# Patient Record
Sex: Female | Born: 1950 | Race: White | Hispanic: No | Marital: Married | Smoking: Former smoker
Health system: Southern US, Community
[De-identification: ages and names within clinical notes are randomized; demographics above are authoritative.]

## PROBLEM LIST (undated history)

## (undated) DIAGNOSIS — H3554 Dystrophies primarily involving the retinal pigment epithelium: Secondary | ICD-10-CM

## (undated) DIAGNOSIS — G4730 Sleep apnea, unspecified: Secondary | ICD-10-CM

## (undated) DIAGNOSIS — I10 Essential (primary) hypertension: Secondary | ICD-10-CM

## (undated) DIAGNOSIS — R011 Cardiac murmur, unspecified: Secondary | ICD-10-CM

## (undated) DIAGNOSIS — F419 Anxiety disorder, unspecified: Secondary | ICD-10-CM

## (undated) DIAGNOSIS — J449 Chronic obstructive pulmonary disease, unspecified: Secondary | ICD-10-CM

## (undated) DIAGNOSIS — O00109 Unspecified tubal pregnancy without intrauterine pregnancy: Secondary | ICD-10-CM

## (undated) DIAGNOSIS — R911 Solitary pulmonary nodule: Secondary | ICD-10-CM

## (undated) DIAGNOSIS — M1990 Unspecified osteoarthritis, unspecified site: Secondary | ICD-10-CM

## (undated) DIAGNOSIS — K635 Polyp of colon: Secondary | ICD-10-CM

## (undated) DIAGNOSIS — W19XXXA Unspecified fall, initial encounter: Secondary | ICD-10-CM

## (undated) DIAGNOSIS — Z9621 Cochlear implant status: Secondary | ICD-10-CM

## (undated) DIAGNOSIS — A048 Other specified bacterial intestinal infections: Secondary | ICD-10-CM

## (undated) DIAGNOSIS — H539 Unspecified visual disturbance: Secondary | ICD-10-CM

## (undated) DIAGNOSIS — G43909 Migraine, unspecified, not intractable, without status migrainosus: Secondary | ICD-10-CM

## (undated) DIAGNOSIS — Z79890 Hormone replacement therapy (postmenopausal): Secondary | ICD-10-CM

## (undated) DIAGNOSIS — Z86018 Personal history of other benign neoplasm: Secondary | ICD-10-CM

## (undated) DIAGNOSIS — E785 Hyperlipidemia, unspecified: Secondary | ICD-10-CM

## (undated) DIAGNOSIS — R159 Full incontinence of feces: Secondary | ICD-10-CM

## (undated) DIAGNOSIS — K219 Gastro-esophageal reflux disease without esophagitis: Secondary | ICD-10-CM

## (undated) DIAGNOSIS — R2689 Other abnormalities of gait and mobility: Secondary | ICD-10-CM

## (undated) DIAGNOSIS — R42 Dizziness and giddiness: Secondary | ICD-10-CM

## (undated) DIAGNOSIS — R413 Other amnesia: Secondary | ICD-10-CM

## (undated) HISTORY — DX: Dizziness and giddiness: R42

## (undated) HISTORY — DX: Polyp of colon: K63.5

## (undated) HISTORY — DX: Unspecified visual disturbance: H53.9

## (undated) HISTORY — DX: Other specified bacterial intestinal infections: A04.8

## (undated) HISTORY — PX: KNEE SURGERY: SHX244

## (undated) HISTORY — DX: Unspecified fall, initial encounter: W19.XXXA

## (undated) HISTORY — DX: Migraine, unspecified, not intractable, without status migrainosus: G43.909

## (undated) HISTORY — PX: OOPHORECTOMY: SHX86

## (undated) HISTORY — DX: Hormone replacement therapy: Z79.890

## (undated) HISTORY — DX: Other abnormalities of gait and mobility: R26.89

## (undated) HISTORY — DX: Other amnesia: R41.3

## (undated) HISTORY — PX: OTHER SURGICAL HISTORY: SHX169

## (undated) HISTORY — DX: Essential (primary) hypertension: I10

## (undated) HISTORY — DX: Anxiety disorder, unspecified: F41.9

## (undated) HISTORY — DX: Chronic obstructive pulmonary disease, unspecified: J44.9

## (undated) HISTORY — DX: Unspecified tubal pregnancy without intrauterine pregnancy: O00.109

## (undated) HISTORY — DX: Unspecified osteoarthritis, unspecified site: M19.90

## (undated) HISTORY — DX: Solitary pulmonary nodule: R91.1

---
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
## 1976-03-25 HISTORY — PX: ECTOPIC PREGNANCY SURGERY: SHX613

## 1978-03-25 DIAGNOSIS — Z9889 Other specified postprocedural states: Secondary | ICD-10-CM

## 1978-03-25 DIAGNOSIS — R112 Nausea with vomiting, unspecified: Secondary | ICD-10-CM

## 1978-03-25 HISTORY — DX: Other specified postprocedural states: Z98.890

## 1978-03-25 HISTORY — DX: Nausea with vomiting, unspecified: R11.2

## 1988-03-25 DIAGNOSIS — F32A Depression, unspecified: Secondary | ICD-10-CM

## 1988-03-25 DIAGNOSIS — R519 Headache, unspecified: Secondary | ICD-10-CM

## 1988-03-25 HISTORY — DX: Headache, unspecified: R51.9

## 1988-03-25 HISTORY — DX: Depression, unspecified: F32.A

## 1993-03-25 HISTORY — PX: HYSTERECTOMY: SHX81

## 1997-03-25 HISTORY — PX: KNEE ARTHROSCOPY W/ MENISCAL REPAIR: SHX1877

## 2013-03-05 ENCOUNTER — Other Ambulatory Visit: Payer: Self-pay

## 2013-03-05 ENCOUNTER — Ambulatory Visit (INDEPENDENT_AMBULATORY_CARE_PROVIDER_SITE_OTHER): Payer: Enrolled Prime—HMO | Admitting: Neurological Surgery

## 2013-03-05 ENCOUNTER — Encounter (INDEPENDENT_AMBULATORY_CARE_PROVIDER_SITE_OTHER): Payer: Self-pay | Admitting: Neurological Surgery

## 2013-03-05 VITALS — BP 124/77 | HR 73 | Resp 18 | Wt 190.0 lb

## 2013-03-05 DIAGNOSIS — D333 Benign neoplasm of cranial nerves: Secondary | ICD-10-CM | POA: Insufficient documentation

## 2013-03-05 NOTE — Progress Notes (Signed)
Review of Systems   Constitutional: Negative for fever and appetite change.   HENT: Positive for tinnitus and trouble swallowing.    Eyes: Negative for pain.   Respiratory: Negative for chest tightness and shortness of breath.    Cardiovascular: Negative for leg swelling.   Genitourinary: Negative for dysuria and urgency.   Skin: Negative for pallor and rash.   Neurological: Positive for dizziness, light-headedness and headaches.   Hematological: Does not bruise/bleed easily.   Psychiatric/Behavioral: Positive for dysphoric mood.

## 2013-03-05 NOTE — Progress Notes (Signed)
I  had the opportunity to meet your very nice patient, Audrey Ray with  her husband in my neurosurgical clinic in consultation on December 12.  As  you are very familiar with her recent history, I will review the salient  neurosurgical details here for our records.     Audrey Ray is an otherwise very healthy-appearing 62 year old woman who  suffers from just over 1 year of progressive right-sided hearing loss,  tinnitus and loss of balance.  She states that the symptoms may have  predated this time period, but she began to notice the acceleration of her  neurological abnormalities after visiting a PCP who performed a hearing  test in January.  She has also been noticing now that in her activities as  a competitive speed walker, she has a tendency to lean to one side and does  tend to lose her balance spontaneously at times.  She has also noticed a  longstanding feeling of fullness in the right ear.  She otherwise denies  headache, nausea, or vomiting, seizure or syncopal-like episode, loss of  visual acuity or double vision,  difficulty swallowing or change in facial  movement or sensation, focal motor or sensory abnormality, or bowel or  bladder disturbance.  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     On neurological exam, Audrey Ray is awake and alert and grossly cognitively  intact.  She states that she is right-handed.  There is no evidence of  expressive or receptive aphasia.  Visual fields are full to confrontation  throughout.  There is no ophthalmoplegia or nystagmus.  Rapid saccades are   intact and she denies diplopia.  Facial sensation and movements are intact  and symmetric.  Her tongue is midline and there is no evidence of  fasciculation or atrophy.  Her palate is upgoing bilaterally.   Sternocleidomastoid and trapezius function is 5/5.  The remainder of her  motor exam  is unremarkable.  She is able to heel and toe walk without  difficulty.  She does have a fairly significant Romberg sign.  Rapid  alternating movement is intact bilaterally.     I had for review the MRIs of the brain that were performed with and without  contrast over the past several months.  As you know, this demonstrates an  approximately 7 mm in greatest dimension, homogeneously enhancing mass  involving the internal auditory canal on the right side without extension  into the intracranial space.  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     I spent 60 minutes with Audrey Ray and her husband in my history taking,  physical examination, review of the imaging data, and counseling, 40  minutes of which were spent in counseling.  Audrey Ray is obviously very well  educated about the surgical options as well as the possibilities of  radiosurgery.  We discussed the risks and benefits of the translabyrinthine  approach and she is very well aware that she is going to lose her hearing  with this approach.  We discussed the relatively low risk of CSF leak and  methods that we would potentially pursue were she to develop such leak.  We  also discussed her followup and I noted that she would likely be feeling  fairly close to her normal baseline within 1 month following surgery.     Thank you  very much for allowing me to participate in the care of your very  nice patient.  We will certainly start working on scheduling surgery for  some point in the end of January.

## 2013-03-25 DIAGNOSIS — H918X9 Other specified hearing loss, unspecified ear: Secondary | ICD-10-CM

## 2013-03-25 HISTORY — PX: BRAIN SURGERY: SHX531

## 2013-03-25 HISTORY — DX: Other specified hearing loss, unspecified ear: H91.8X9

## 2013-04-07 ENCOUNTER — Ambulatory Visit: Payer: Enrolled Prime—HMO

## 2013-04-07 NOTE — Pre-Procedure Instructions (Signed)
Pt states preop labs, ekg were done by PMD today, 04/07/13, requesting by fax to surgeon to have copy of notes and results sent to [phone].

## 2013-04-13 NOTE — Pre-Procedure Instructions (Signed)
Spoke w/Sillo at [facility] where EKG was done requesting a better copy of EKG. Per Sillo, their system is down & she will fax a better copy as soon as the system is back up.

## 2013-04-14 ENCOUNTER — Inpatient Hospital Stay: Payer: Enrolled Prime—HMO | Admitting: Registered Nurse

## 2013-04-14 ENCOUNTER — Inpatient Hospital Stay: Payer: Enrolled Prime—HMO | Admitting: Neurological Surgery

## 2013-04-14 ENCOUNTER — Encounter
Admission: RE | Disposition: A | Discharge status: Home Health | Payer: Self-pay | Source: Ambulatory Visit | Attending: Otolaryngology

## 2013-04-14 ENCOUNTER — Encounter: Payer: Self-pay | Admitting: Registered Nurse

## 2013-04-14 ENCOUNTER — Inpatient Hospital Stay
Admission: RE | Admit: 2013-04-14 | Discharge: 2013-04-21 | DRG: 026 | Disposition: A | Discharge status: Home Health | Payer: Enrolled Prime—HMO | Source: Ambulatory Visit | Attending: Otolaryngology | Admitting: Otolaryngology

## 2013-04-14 ENCOUNTER — Ambulatory Visit: Payer: Self-pay

## 2013-04-14 DIAGNOSIS — R112 Nausea with vomiting, unspecified: Secondary | ICD-10-CM | POA: Diagnosis not present

## 2013-04-14 DIAGNOSIS — H9319 Tinnitus, unspecified ear: Secondary | ICD-10-CM | POA: Diagnosis present

## 2013-04-14 DIAGNOSIS — G988 Other disorders of nervous system: Secondary | ICD-10-CM | POA: Diagnosis not present

## 2013-04-14 DIAGNOSIS — G9601 Cranial cerebrospinal fluid leak, spontaneous: Secondary | ICD-10-CM | POA: Diagnosis not present

## 2013-04-14 DIAGNOSIS — H5500 Unspecified nystagmus: Secondary | ICD-10-CM | POA: Diagnosis not present

## 2013-04-14 DIAGNOSIS — R42 Dizziness and giddiness: Secondary | ICD-10-CM | POA: Diagnosis not present

## 2013-04-14 DIAGNOSIS — D210 Benign neoplasm of connective and other soft tissue of head, face and neck: Secondary | ICD-10-CM

## 2013-04-14 DIAGNOSIS — Y838 Other surgical procedures as the cause of abnormal reaction of the patient, or of later complication, without mention of misadventure at the time of the procedure: Secondary | ICD-10-CM | POA: Diagnosis not present

## 2013-04-14 DIAGNOSIS — K219 Gastro-esophageal reflux disease without esophagitis: Secondary | ICD-10-CM | POA: Diagnosis present

## 2013-04-14 DIAGNOSIS — H9190 Unspecified hearing loss, unspecified ear: Secondary | ICD-10-CM | POA: Diagnosis present

## 2013-04-14 DIAGNOSIS — D333 Benign neoplasm of cranial nerves: Secondary | ICD-10-CM | POA: Diagnosis present

## 2013-04-14 HISTORY — PX: CRANIOTOMY, TRANS-PETROSAL APPROACH: SHX3528

## 2013-04-14 HISTORY — PX: MASTOIDECTOMY, MODIFIED: SHX4795

## 2013-04-14 LAB — BASIC METABOLIC PANEL
BUN: 12 mg/dL (ref 7.0–19.0)
CO2: 23 mEq/L (ref 22–29)
Calcium: 8.8 mg/dL (ref 8.5–10.5)
Chloride: 106 mEq/L (ref 98–107)
Creatinine: 0.8 mg/dL (ref 0.6–1.0)
Glucose: 181 mg/dL — ABNORMAL HIGH (ref 70–100)
Potassium: 4.2 mEq/L (ref 3.5–5.1)
Sodium: 138 mEq/L (ref 136–145)

## 2013-04-14 LAB — CBC
Hematocrit: 38 % (ref 37.0–47.0)
Hgb: 12.5 g/dL (ref 12.0–16.0)
MCH: 29.6 pg (ref 28.0–32.0)
MCHC: 32.9 g/dL (ref 32.0–36.0)
MCV: 90 fL (ref 80.0–100.0)
MPV: 9.7 fL (ref 9.4–12.3)
Nucleated RBC: 0 (ref 0–1)
Platelets: 307 10*3/uL (ref 140–400)
RBC: 4.22 10*6/uL (ref 4.20–5.40)
RDW: 13 % (ref 12–15)
WBC: 11.64 10*3/uL — ABNORMAL HIGH (ref 3.50–10.80)

## 2013-04-14 LAB — GFR: EGFR: >60

## 2013-04-14 LAB — TYPE AND SCREEN
AB Screen Gel: NEGATIVE
ABO Rh: A POS

## 2013-04-14 SURGERY — CRANIOTOMY, TRANS-PETROSAL APPROACH
Anesthesia: Anesthesia General | Site: Ear | Laterality: Right | Wound class: Clean

## 2013-04-14 MED ORDER — ONDANSETRON HCL 4 MG/2ML IJ SOLN
4.0000 mg | Freq: Once | INTRAMUSCULAR | Status: DC | PRN
Start: 2013-04-14 — End: 2013-04-14

## 2013-04-14 MED ORDER — SODIUM CHLORIDE 0.9 % IV MBP
1.0000 g | Freq: Three times a day (TID) | INTRAVENOUS | Status: DC
Start: 2013-04-14 — End: 2013-04-14
  Administered 2013-04-14: 1 g via INTRAVENOUS

## 2013-04-14 MED ORDER — CEFAZOLIN SODIUM 1 G IJ SOLR
INTRAMUSCULAR | Status: AC
Start: 2013-04-14 — End: 2013-04-14
  Filled 2013-04-14: qty 1000

## 2013-04-14 MED ORDER — DIAZEPAM 5 MG/ML IJ SOLN
INTRAMUSCULAR | Status: AC
Start: 2013-04-14 — End: 2013-04-14
  Administered 2013-04-14: 5 mg via INTRAVENOUS
  Filled 2013-04-14: qty 2

## 2013-04-14 MED ORDER — ROCURONIUM BROMIDE 50 MG/5ML IV SOLN
INTRAVENOUS | Status: AC
Start: 2013-04-14 — End: ?
  Filled 2013-04-14: qty 5

## 2013-04-14 MED ORDER — FAMOTIDINE 20 MG/2ML IV SOLN
INTRAVENOUS | Status: AC
Start: 2013-04-14 — End: ?
  Filled 2013-04-14: qty 2

## 2013-04-14 MED ORDER — MEPERIDINE HCL 25 MG/ML IJ SOLN
25.0000 mg | INTRAMUSCULAR | Status: DC | PRN
Start: 2013-04-14 — End: 2013-04-14

## 2013-04-14 MED ORDER — PROPOFOL INFUSION 10 MG/ML
INTRAVENOUS | Status: DC | PRN
Start: 2013-04-14 — End: 2013-04-14
  Administered 2013-04-14: 150 mg via INTRAVENOUS
  Administered 2013-04-14: 50 mg via INTRAVENOUS

## 2013-04-14 MED ORDER — THROMBIN 5000 UNITS EX SOLR
CUTANEOUS | Status: DC | PRN
Start: 2013-04-14 — End: 2013-04-14
  Administered 2013-04-14: 5000 [IU] via TOPICAL

## 2013-04-14 MED ORDER — MORPHINE SULFATE 2 MG/ML IJ/IV SOLN (WRAP)
1.0000 mg | Status: DC | PRN
Start: 2013-04-14 — End: 2013-04-21
  Administered 2013-04-16 – 2013-04-19 (×3): 1 mg via INTRAVENOUS
  Filled 2013-04-14 (×3): qty 1

## 2013-04-14 MED ORDER — CEFAZOLIN 1 GM MBP (CNR)
Status: AC
Start: 2013-04-14 — End: ?
  Filled 2013-04-14: qty 50

## 2013-04-14 MED ORDER — GLYCOPYRROLATE 0.2 MG/ML IJ SOLN
INTRAMUSCULAR | Status: AC
Start: 2013-04-14 — End: ?
  Filled 2013-04-14: qty 1

## 2013-04-14 MED ORDER — PHENYLEPHRINE 100 MCG/ML IV BOLUS (ANESTHESIA)
PREFILLED_SYRINGE | INTRAVENOUS | Status: AC
Start: 2013-04-14 — End: ?
  Filled 2013-04-14: qty 10

## 2013-04-14 MED ORDER — DOCUSATE SODIUM 100 MG PO CAPS
100.0000 mg | ORAL_CAPSULE | Freq: Two times a day (BID) | ORAL | Status: DC
Start: 2013-04-14 — End: 2013-04-21
  Administered 2013-04-17 – 2013-04-21 (×8): 100 mg via ORAL
  Filled 2013-04-14 (×8): qty 1

## 2013-04-14 MED ORDER — ONDANSETRON HCL 4 MG/2ML IJ SOLN
4.0000 mg | INTRAMUSCULAR | Status: DC | PRN
Start: 2013-04-14 — End: 2013-04-15
  Administered 2013-04-14: 4 mg via INTRAVENOUS
  Filled 2013-04-14: qty 2

## 2013-04-14 MED ORDER — LIDOCAINE HCL 2 % IJ SOLN
INTRAMUSCULAR | Status: DC | PRN
Start: 2013-04-14 — End: 2013-04-14
  Administered 2013-04-14: 60 mg

## 2013-04-14 MED ORDER — HYDROMORPHONE HCL PF 1 MG/ML IJ SOLN
0.5000 mg | INTRAMUSCULAR | Status: DC | PRN
Start: 2013-04-14 — End: 2013-04-14

## 2013-04-14 MED ORDER — PROMETHAZINE HCL 25 MG/ML IJ SOLN
6.2500 mg | Freq: Once | INTRAMUSCULAR | Status: DC | PRN
Start: 2013-04-14 — End: 2013-04-14

## 2013-04-14 MED ORDER — EPHEDRINE SULFATE 50 MG/ML IJ SOLN
INTRAMUSCULAR | Status: AC
Start: 2013-04-14 — End: ?
  Filled 2013-04-14: qty 1

## 2013-04-14 MED ORDER — PHENYLEPHRINE 100 MCG/ML IV BOLUS (ANESTHESIA)
PREFILLED_SYRINGE | INTRAVENOUS | Status: AC
Start: 2013-04-14 — End: ?
  Filled 2013-04-14: qty 5

## 2013-04-14 MED ORDER — ALUM & MAG HYDROXIDE-SIMETH 200-200-20 MG/5ML PO SUSP
30.0000 mL | ORAL | Status: DC | PRN
Start: 2013-04-14 — End: 2013-04-21

## 2013-04-14 MED ORDER — NEOSTIGMINE METHYLSULFATE 1 MG/ML IJ SOLN
INTRAMUSCULAR | Status: DC | PRN
Start: 2013-04-14 — End: 2013-04-14
  Administered 2013-04-14: 2 mg via INTRAVENOUS

## 2013-04-14 MED ORDER — ALBUMIN HUMAN 5 % IV SOLN
INTRAVENOUS | Status: DC | PRN
Start: 2013-04-14 — End: 2013-04-14

## 2013-04-14 MED ORDER — FENTANYL CITRATE 0.05 MG/ML IJ SOLN
INTRAMUSCULAR | Status: AC
Start: 2013-04-14 — End: ?
  Filled 2013-04-14: qty 4

## 2013-04-14 MED ORDER — LIDOCAINE-EPINEPHRINE 1 %-1:100000 IJ SOLN
INTRAMUSCULAR | Status: DC | PRN
Start: 2013-04-14 — End: 2013-04-14
  Administered 2013-04-14: 4 mL

## 2013-04-14 MED ORDER — GELATIN ABSORBABLE 100 EX MISC
CUTANEOUS | Status: DC | PRN
Start: 2013-04-14 — End: 2013-04-14
  Administered 2013-04-14: 1 via TOPICAL

## 2013-04-14 MED ORDER — PANTOPRAZOLE SODIUM 40 MG PO TBEC
40.0000 mg | DELAYED_RELEASE_TABLET | Freq: Every morning | ORAL | Status: DC
Start: 2013-04-15 — End: 2013-04-21
  Administered 2013-04-17 – 2013-04-21 (×4): 40 mg via ORAL
  Filled 2013-04-14 (×4): qty 1

## 2013-04-14 MED ORDER — FENTANYL CITRATE 0.05 MG/ML IJ SOLN
25.0000 ug | INTRAMUSCULAR | Status: DC | PRN
Start: 2013-04-14 — End: 2013-04-14

## 2013-04-14 MED ORDER — LIDOCAINE HCL (PF) 2 % IJ SOLN
INTRAMUSCULAR | Status: AC
Start: 2013-04-14 — End: ?
  Filled 2013-04-14: qty 5

## 2013-04-14 MED ORDER — NORTRIPTYLINE HCL 25 MG PO CAPS
75.0000 mg | ORAL_CAPSULE | Freq: Every evening | ORAL | Status: DC
Start: 2013-04-14 — End: 2013-04-21
  Administered 2013-04-16 – 2013-04-20 (×5): 75 mg via ORAL
  Filled 2013-04-14 (×8): qty 3

## 2013-04-14 MED ORDER — ONDANSETRON HCL 4 MG/2ML IJ SOLN
INTRAMUSCULAR | Status: DC | PRN
Start: 2013-04-14 — End: 2013-04-14
  Administered 2013-04-14: 4 mg via INTRAVENOUS

## 2013-04-14 MED ORDER — ROCURONIUM BROMIDE 50 MG/5ML IV SOLN
INTRAVENOUS | Status: DC | PRN
Start: 2013-04-14 — End: 2013-04-14
  Administered 2013-04-14: 40 mg via INTRAVENOUS
  Administered 2013-04-14: 10 mg via INTRAVENOUS

## 2013-04-14 MED ORDER — LACTATED RINGERS IV SOLN
INTRAVENOUS | Status: DC
Start: 2013-04-14 — End: 2013-04-14

## 2013-04-14 MED ORDER — SODIUM CHLORIDE 0.9 % IV MBP
1.0000 g | Freq: Three times a day (TID) | INTRAVENOUS | Status: AC
Start: 2013-04-14 — End: 2013-04-15
  Administered 2013-04-14 – 2013-04-15 (×4): 1 g via INTRAVENOUS
  Filled 2013-04-14: qty 1000
  Filled 2013-04-14: qty 50
  Filled 2013-04-14 (×2): qty 1000
  Filled 2013-04-14: qty 50

## 2013-04-14 MED ORDER — CALCIUM CITRATE-VITAMIN D 315-250 MG-UNIT PO TABS
1.0000 | ORAL_TABLET | Freq: Every day | ORAL | Status: DC
Start: 2013-04-14 — End: 2013-04-21
  Administered 2013-04-17 – 2013-04-21 (×4): 1 via ORAL
  Filled 2013-04-14 (×4): qty 1

## 2013-04-14 MED ORDER — FAMOTIDINE 10 MG/ML IV SOLN (WRAP)
INTRAVENOUS | Status: DC | PRN
Start: 2013-04-14 — End: 2013-04-14
  Administered 2013-04-14: 20 mg via INTRAVENOUS

## 2013-04-14 MED ORDER — PHENYLEPHRINE 100 MCG/ML IV BOLUS (ANESTHESIA)
PREFILLED_SYRINGE | INTRAVENOUS | Status: DC | PRN
Start: 2013-04-14 — End: 2013-04-14
  Administered 2013-04-14 (×15): 100 ug via INTRAVENOUS

## 2013-04-14 MED ORDER — PROPOFOL 10 MG/ML IV EMUL
INTRAVENOUS | Status: AC
Start: 2013-04-14 — End: ?
  Filled 2013-04-14: qty 20

## 2013-04-14 MED ORDER — MAGNESIUM HYDROXIDE 400 MG/5ML PO SUSP
30.0000 mL | ORAL | Status: DC | PRN
Start: 2013-04-14 — End: 2013-04-21

## 2013-04-14 MED ORDER — DEXAMETHASONE SODIUM PHOSPHATE 4 MG/ML IJ SOLN (WRAP)
INTRAMUSCULAR | Status: DC | PRN
Start: 2013-04-14 — End: 2013-04-14
  Administered 2013-04-14: 10 mg via INTRAVENOUS

## 2013-04-14 MED ORDER — LACTATED RINGERS IV SOLN
INTRAVENOUS | Status: DC
Start: 2013-04-14 — End: 2013-04-16
  Administered 2013-04-16: 100 mL/h via INTRAVENOUS

## 2013-04-14 MED ORDER — GLYCOPYRROLATE 0.2 MG/ML IJ SOLN
INTRAMUSCULAR | Status: DC | PRN
Start: 2013-04-14 — End: 2013-04-14
  Administered 2013-04-14: 0.4 mg via INTRAVENOUS

## 2013-04-14 MED ORDER — DEXAMETHASONE SODIUM PHOSPHATE 20 MG/5ML IJ SOLN
INTRAMUSCULAR | Status: AC
Start: 2013-04-14 — End: ?
  Filled 2013-04-14: qty 5

## 2013-04-14 MED ORDER — DIAZEPAM 5 MG/ML IJ SOLN
5.0000 mg | INTRAMUSCULAR | Status: DC | PRN
Start: 2013-04-14 — End: 2013-04-16
  Administered 2013-04-15 (×3): 5 mg via INTRAVENOUS
  Filled 2013-04-14 (×4): qty 2

## 2013-04-14 MED ORDER — FENTANYL CITRATE 0.05 MG/ML IJ SOLN
INTRAMUSCULAR | Status: DC | PRN
Start: 2013-04-14 — End: 2013-04-14
  Administered 2013-04-14 (×2): 50 ug via INTRAVENOUS
  Administered 2013-04-14: 75 ug via INTRAVENOUS
  Administered 2013-04-14 (×3): 50 ug via INTRAVENOUS
  Administered 2013-04-14: 75 ug via INTRAVENOUS

## 2013-04-14 MED ORDER — SODIUM CHLORIDE 0.9 % IV SOLN
INTRAVENOUS | Status: DC | PRN
Start: 2013-04-14 — End: 2013-04-14

## 2013-04-14 MED ORDER — OXYCODONE-ACETAMINOPHEN 5-325 MG PO TABS
2.0000 | ORAL_TABLET | ORAL | Status: DC | PRN
Start: 2013-04-14 — End: 2013-04-21
  Administered 2013-04-18 – 2013-04-21 (×6): 2 via ORAL
  Filled 2013-04-14 (×6): qty 2

## 2013-04-14 MED ORDER — SODIUM CHLORIDE 0.9 % IR SOLN
Status: DC | PRN
Start: 2013-04-14 — End: 2013-04-14
  Administered 2013-04-14: 1000 mL

## 2013-04-14 MED ORDER — ONDANSETRON HCL 4 MG/2ML IJ SOLN
INTRAMUSCULAR | Status: AC
Start: 2013-04-14 — End: ?
  Filled 2013-04-14: qty 2

## 2013-04-14 SURGICAL SUPPLY — 68 items
BANDAGE KERLIX MEDIUM GAUZE L3.6 YD X (Dressing) ×2 IMPLANT
BLADE S/SU RIBBACK CARB STL 15 (Blade) ×3 IMPLANT
BLADE SURGICAL DISPOSABLE (Blade) ×3 IMPLANT
BNDG KRLX GZE 3.6YDX6.4IN MED CTTN 6 PLY (Dressing) ×1
BUR M 2 (Burr) ×3 IMPLANT
CLIP EXTERNAL CARTRIDGE SERRATE EDGE JAW (Clips) ×2
CLIP EXTERNAL CARTRIDGE SERRATE EDGE JAW HEAVY CLAMP FORCE ACRACLIP (Clips) ×2 IMPLANT
CLIP XTRN ACRACLIP STRL CRTDG SRR EDG (Clips) ×1
CLOSURE STERI-STRIP 1X5IN (Dressing) ×3 IMPLANT
CONTAINER SPEC 8OZ NS SNPON LID TRNLU (Suction) ×9 IMPLANT
COTTONOID 1/2X1" 80-1402" (Dressing) IMPLANT
CTTND 1/2X1/2" (Sponge) IMPLANT
DRAPE LIECA MICROSCOPE 54X150 (Drape) ×3 IMPLANT
DRESSING FLEXZAN 4X4 (Dressing) ×3 IMPLANT
DRESSING SURG PATTY .5X3IN (Sponge) ×3 IMPLANT
FLOSEAL EHSD 5ML (Hemostat) ×3 IMPLANT
GAUZE KERLIX 4.5X4YDS (Dressing) ×3 IMPLANT
GLOVE SRG NTR RBR 8 INDCTR BGL 299X103MM (Glove) ×2
GLOVE SURG BIOGEL SZ7.5 (Glove) ×6 IMPLANT
GLOVE SURGICAL 8 INDICATOR BIOGEL POWDER (Glove) ×4
GLOVE SURGICAL 8 INDICATOR BIOGEL POWDER FREE SMOOTH BEAD CUFF (Glove) ×4 IMPLANT
KIT CRANIOTOMY FFX (Tray) ×3 IMPLANT
MARKER SKIN (Positioning Supplies) ×3 IMPLANT
NEEDLE BLNT AL SS STD MNJCT 18GA 1IN LF (Needles) ×2
NEEDLE L1 IN OD18 GA ALUMINUM STAINLESS (Needles) ×4
NEEDLE L1 IN OD18 GA ALUMINUM STAINLESS STEEL LUER LOCK HUB BLUNT (Needles) ×4 IMPLANT
NEEDLE REG BEVEL 19GX1.5IN (Needles) ×12 IMPLANT
PACKET I PVP OINTMENT 1 GM (Tubing) ×3 IMPLANT
PAD ELECTROSRG GRND REM W CRD (Procedure Accessories) ×3 IMPLANT
PATTIES 1X3 USE LAWSON 2442 (Sponge) ×3 IMPLANT
PROBE NERVE STIMULATOR STANDARD (Cautery) ×2
PROBE NERVE STIMULATOR STANDARD MONOPOLAR FLUSH TIP OD.5 MM PRASS (Cautery) ×2 IMPLANT
PROBE PRASS NS STD .5MM MNPLR FLSH TIP (Cautery) ×1
SLEEVE SEQUEN COMP KNEE REG (Procedure Accessories) ×3 IMPLANT
SOL ALCOHOL ISOPROPYL 70% 4 OZ (Prep) ×3 IMPLANT
SOL IRR 0.9% NACL 500ML PLS PR BTL ISTNC (Irrigation Solutions)
SOL NACL .9% IRRIG 250ML NLTX (IV Solutions) ×1
SOL NACL INJ 0.9% 30ML BACTER (IV Solutions) ×3 IMPLANT
SOLUTION IRR 0.9% NACL 1000ML LF STRL (Irrigation Solutions) ×1
SOLUTION IRRIGATION 0.9% SDM CHLORIDE 500ML PR BTTL ISOTONIC NONPRGNC (Irrigation Solutions) IMPLANT
SOLUTION IRRIGATION 0.9% SODIUM CHLORIDE (IV Solutions) ×2
SOLUTION IRRIGATION 0.9% SODIUM CHLORIDE (Irrigation Solutions) ×2
SOLUTION IRRIGATION 0.9% SODIUM CHLORIDE 1000 ML PLASTIC POUR BOTTLE (Irrigation Solutions) ×2 IMPLANT
SOLUTION IRRIGATION 0.9% SODIUM CHLORIDE 250 ML PLASTIC POUR BOTTLE (IV Solutions) ×2 IMPLANT
SOLUTION SRGPRP 74% ISPRP 0.7% IOD (Prep) ×1
SOLUTION SURGICAL PREP 26 ML DURAPREP (Prep) ×2
SOLUTION SURGICAL PREP 26 ML DURAPREP 74% ISOPROPYL ALCOHOL 0.7% (Prep) ×2 IMPLANT
SPNG ABSORBABLE GELATIN (Hemostat) ×3 IMPLANT
SPONGE NEURO/ORTHO TELFA 1/2X3 (Sponge) ×3 IMPLANT
SPONGE SRG RYN TELFA 3X.25IN LF STRL ABS (Sponge) ×1
SPONGE SURGICAL L3 IN X W.25 IN (Sponge) ×2
SPONGE SURGICAL L3 IN X W.25 IN ABSORBENT NONADHERENT STRIP RAYON (Sponge) ×2 IMPLANT
SUTURE NUROLON 4-0 RB1 8X18IN (Suture) ×3 IMPLANT
SUTURE VICRYL 2-0 CP2 8X18IN (Suture) ×3 IMPLANT
SYRINGE 20 ML BD LUER-LOK MEDICAL (Syringes, Needles) ×4 IMPLANT
SYRINGE LUER LOCK 10CC (Syringes, Needles) ×9 IMPLANT
SYRINGE MED 20ML LL LF STRL (Syringes, Needles) ×6
TOOL DISSECTING L8 CM SPIRAL TAPER FOOT (Burr)
TOOL DISSECTING L8 CM SPIRAL TAPER FOOT ATTACHMENT OD2.3 MM MIDAS REX (Burr) IMPLANT
TOOL DISSECTING L9 CM ACORN FLUTE OD6 MM (Burr)
TOOL DISSECTING L9 CM ACORN FLUTE OD6 MM MIDAS REX LEGEND (Burr) IMPLANT
TOOL DSCT ACRN LGND 6MM 9CM (Burr)
TOOL DSCT SPRL TPR MDSRX LGND 2.3MM 8CM (Burr)
TOWEL STERILE REUSABLE 8PK (Procedure Accessories) ×3 IMPLANT
TRAY FOLEY TEMP SENSITIVE 16FR (Tray) ×3 IMPLANT
TUBING CONNECTING STERILE 10FT (Tubing) ×1
TUBING SUCTION ID3/16 IN L10 FT (Tubing) ×2
TUBING SUCTION ID3/16 IN L10 FT NONCONDUCTIVE STRAIGHT MALE FEMALE (Tubing) ×2 IMPLANT

## 2013-04-14 NOTE — PACU (Signed)
Dr. Santina Evans at bedside assessing pt. Pt. Complaining of dizziness. Will administer diazepam per order for dizziness.

## 2013-04-14 NOTE — H&P (Signed)
Pt examined within 24 hours of surgery. No changes in history of physical exam.

## 2013-04-14 NOTE — PACU (Signed)
Spoke to Dr. Daron Offer to clarify orders to transfer patient to [facility] status -- patient may go to [facility].

## 2013-04-14 NOTE — Brief Op Note (Signed)
BRIEF OP NOTE    Date Time: 04/14/2013 1:48 PM    Patient Name:   Audrey Ray    Date of Operation:   04/14/2013    Providers Performing:   Surgeon(s):  Ericka Pontiff, MD  Judie Bonus, MD PHD  Wesley Desanctis MD    Assistant (s):   Toni Amend, RN - Team Leader  Pierpont, Geronimo Boot M - Scrub Person  Philips, Selena Batten, RN - Circulator  Philips, Selena Batten, RN - Relief Scrub  Mihalko, Coleen A, RN - Relief Circulator  Na, Haelim, RN - Relief Circulator    Operative Procedure:   Procedure(s):  CRANIOTOMY, TRANS-PETROSAL APPROACH  MASTOIDECTOMY, MODIFIED    Preoperative Diagnosis:   Pre-Op Diagnosis Codes:     * Vestibular schwannoma [225.1]    Postoperative Diagnosis:   same    Anesthesia:   General    Estimated Blood Loss:    50 mL    Implants:   * No implants in log *    Drains:   Drains: no    Specimens:        SPECIMENS (last 24 hours)      Pathology Specimens     Row Name 04/14/13 1300             Specimen Information    Specimen Testing Required Routine Pathology     Specimen ID  a     Specimen Description right auditory canal mass         Findings:   1. subcentimeter intracanalicular mass on right superior vestibular nerve  2. Facial nerve identified and preserved.  Stimulated with 0.101mA in the internal auditory canal    Complications:   none      Signed by: Dema Severin, MD                                                                           [facility]

## 2013-04-14 NOTE — Anesthesia Preprocedure Evaluation (Addendum)
Anesthesia Evaluation    AIRWAY    Mallampati: I    TM distance: <3 FB  Neck ROM: full  Mouth Opening:full   CARDIOVASCULAR    cardiovascular exam normal       DENTAL    No notable dental hx     PULMONARY    pulmonary exam normal     OTHER FINDINGS              PSS Anesthesia Comments: No cp sob doe  Runs half marathons        Anesthesia Plan    ASA 2     general                     intravenous induction   Detailed anesthesia plan: general endotracheal  Monitors/Adjuncts: arterial line      Post op pain management: per surgeon    informed consent obtained      pertinent labs reviewed

## 2013-04-14 NOTE — Transfer of Care (Signed)
Anesthesia Transfer of Care Note    Patient: Audrey Ray    Procedures performed: Procedure(s) with comments:  CRANIOTOMY, TRANS-PETROSAL APPROACH - RIGHT TRANSLABYRINTHINE APPROACH FOR VESTIBULAR SCHWANNOMA, ABDOMINAL FAT GRAFT  MASTOIDECTOMY, MODIFIED    Anesthesia type: General ETT    Patient location:Phase I PACU    Last vitals:   Filed Vitals:    04/14/13 0714   BP: 154/79   Pulse: 84   Temp: 98 F (36.7 C)   SpO2: 100%       Post pain: Patient not complaining of pain, continue current therapy      Mental Status:awake    Respiratory Function: tolerating face mask    Cardiovascular: stable    Nausea/Vomiting: patient not complaining of nausea or vomiting    Hydration Status: adequate    Post assessment: no apparent anesthetic complications, no reportable events and no evidence of recall

## 2013-04-14 NOTE — OR PreOp (Signed)
Mepilex applied to sacral area. Pt states surgery will be 9 hours.

## 2013-04-14 NOTE — Op Note (Signed)
FULL OPERATIVE NOTE    Date Time: 04/14/2013 2:13 PM  Patient Name: ZOXWR,UEAVW  Attending Physician: Ericka Pontiff, MD      Date of Operation:   04/14/2013    Providers Performing:   Surgeon(s):  Ericka Pontiff, MD  Judie Bonus, MD PHD    Asst:  Wesley Desanctis, MD    Toni Amend, RN - Team Leader  Emerald, Geronimo Boot M - Scrub Person  Philips, Selena Batten, RN - Circulator  Philips, Selena Batten, RN - Relief Scrub  Mihalko, Coleen A, RN - Relief Circulator  Na, Haelim, RN - Relief Circulator    Operative Procedure:   Procedure(s):  CRANIOTOMY, TRANS-PETROSAL APPROACH  MASTOIDECTOMY, MODIFIED    Preoperative Diagnosis:   Pre-Op Diagnosis Codes:     * Vestibular schwannoma [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 After a timeout was performed, general anesthesia was induced and the patient was intubated without difficulty.    The table was turned 180 degrees and Lidocaine with epinephrine 1:100000 was injected in the retro-auricular area.  The right ear was prepped and draped and the patient was positioned for otologic surgery.  The facial nerve monitor was placed into position and it was ensured it was functioning appropriately.  Once this was confirmed attention was turned to the ear.    A post-auricular incision was performed 4cm behind the sulcus and carried down to the level of the superficial temporal fascia.  A temporalis fascial graft was harvested from the fascia of the temporalis muscle on the ipsilateral side as follows.  A 15 blade was used to incise the fascia parallel to the temporal line.  Next, an Adson forceps as well as an iris scissors were used to define and excise the fascia graft. Hemostasis was achieved in the graft bed with cautery.  The graft was cleaned, pressed, and lay on a teflon block to dry on the back table.     A curvilinear incision was made in  the periosteum. The periosteum was elevated posteriorly to expose the mastoid and anteriorly to the outer limits of the external auditory canal using the [facility]. Weitlaner retractors were placed to retract soft tissue.     Next, using a combination of 6 cutting burr and 6,4,3, and 2 diamond burrs a mastoidectomy was performed.  The tegmen was identified superiorly. The sigmoid sinus was identified posteriorly. The sinodural angle was identified. The posterior external auditory canal wall was thinned.  Following removal of koerner's septum the antrum and the horizontal semicircular canal were identified. Incus was identified in normal position.    At this point, bone was removed over the sigmoid sinus and the posterior fossa dura. A greenberg retraction was set up and the sigmoid sinus and cerebellum were retracted posteriorly. Then, using a 4 diamond burr, the facial nerve was identified. Using a 3 cutting burr, a complete labyrinthectomy was performed. The IAC was then skeletonized approximately 240 degrees.    A nerve stimulator was used to look for an abnormal location of the facial nerve. The facial nerve was located superiorly and anteriorly as expected. Using an 11-blade and sickle knife, the dura over the IAC was incised in an H-shaped fashion and retracted, revealing tumor laterally and normal nerves medially. At the far lateral aspect of the tumor,  the interface between superior vestibular nerve and facial nerve was identified. The superior vestibular nerve was avulsed and the tumor was carefully dissected away from the facial nerve from lateral to medial. At the medial aspect of the tumor, where it became normal nerve, the normal appearing superior and inferior vestibular nerve were sectioned. The tumor was then removed en bloc and sent for pathology. The cochlear nerve was left intact.    Attention was directed to the abdomen. Using a 15 blade, a 3cm incision was made in the right lower quadrant  down to level of fat. A 3x3 cm piece was fat was harvested. Hemostasis was achieved and the incision was closed in two levels.    Facial recess and retrofacial air cells were waxed. Temporalis fascia was placed over the IAC as well as over the mastoid antrum. The fat graft was used to fill the mastoid defect. The postauricular incision was closed with 3-0 vicryl sutures deep and a running 4-0 prolene at the epidermis. A mastoid pressure dressing was placed. The patient was returned to the anesthesia team.        Estimated Blood Loss:   50 mL    Implants:   * No implants in log *    Drains:   Drains: no    Specimens:        SPECIMENS (last 24 hours)      Pathology Specimens     Row Name 04/14/13 1300             Specimen Information    Specimen Testing Required Routine Pathology     Specimen ID  a     Specimen Description right auditory canal mass         Complications:   none      Signed by: Judie Bonus, MD

## 2013-04-14 NOTE — PACU (Signed)
Symmetrical smile, closes eyes tightly, able to wrinkle brow, sticks out tongue midline and moves side to side, puff out cheeks, sensation intact,     Distinct speech, no visual disturbances    Strong/symmetrical shoulder shrug    Strong bilateral hand grasps, strong bilateral dorsiflexion, strong bilateral plantar flexion,

## 2013-04-15 ENCOUNTER — Encounter: Payer: Self-pay | Admitting: Neurological Surgery

## 2013-04-15 ENCOUNTER — Inpatient Hospital Stay: Payer: Enrolled Prime—HMO

## 2013-04-15 MED ORDER — ONDANSETRON HCL 4 MG/2ML IJ SOLN
4.0000 mg | INTRAMUSCULAR | Status: DC | PRN
Start: 2013-04-15 — End: 2013-04-21
  Administered 2013-04-15 – 2013-04-16 (×4): 4 mg via INTRAVENOUS
  Filled 2013-04-15 (×4): qty 2

## 2013-04-15 MED ORDER — GADOBUTROL 1 MMOL/ML IV SOLN
6.2000 mL | Freq: Once | INTRAVENOUS | Status: AC | PRN
Start: 2013-04-15 — End: 2013-04-15
  Administered 2013-04-15: 6.2 mmol via INTRAVENOUS
  Filled 2013-04-15: qty 7.5

## 2013-04-15 MED ORDER — PROMETHAZINE HCL 25 MG/ML IJ SOLN
12.5000 mg | Freq: Once | INTRAMUSCULAR | Status: AC
Start: 2013-04-15 — End: 2013-04-15
  Administered 2013-04-15: 12.5 mg via INTRAVENOUS
  Filled 2013-04-15: qty 1

## 2013-04-15 MED ORDER — PROMETHAZINE HCL 25 MG/ML IJ SOLN
12.5000 mg | Freq: Four times a day (QID) | INTRAMUSCULAR | Status: DC | PRN
Start: 2013-04-15 — End: 2013-04-16
  Administered 2013-04-16: 12.5 mg via INTRAVENOUS
  Filled 2013-04-15: qty 1

## 2013-04-15 MED ORDER — PROMETHAZINE HCL 25 MG/ML IJ SOLN
12.5000 mg | Freq: Once | INTRAMUSCULAR | Status: DC
Start: 2013-04-15 — End: 2013-04-16

## 2013-04-15 MED ORDER — DEXAMETHASONE SODIUM PHOSPHATE 4 MG/ML IJ SOLN (WRAP)
4.0000 mg | Freq: Once | INTRAMUSCULAR | Status: AC
Start: 2013-04-15 — End: 2013-04-15
  Administered 2013-04-15: 4 mg via INTRAVENOUS
  Filled 2013-04-15: qty 1

## 2013-04-15 NOTE — Anesthesia Postprocedure Evaluation (Signed)
Anesthesia Post Evaluation    Patient: Audrey Ray    Procedures performed: Procedure(s) with comments:  CRANIOTOMY, TRANS-PETROSAL APPROACH - RIGHT TRANSLABYRINTHINE APPROACH FOR VESTIBULAR SCHWANNOMA, ABDOMINAL FAT GRAFT  MASTOIDECTOMY, MODIFIED    Anesthesia type: General ETT    Patient location:Telemetry/Step Down Unit    Last vitals:   Filed Vitals:    04/15/13 1200   BP:    Pulse:    Temp: 97.9 F (36.6 C)   Resp:    SpO2:        Post pain: Patient not complaining of pain, continue current therapy      Mental Status:awake    Respiratory Function: tolerating room air    Cardiovascular: stable    Nausea/Vomiting: nausea and/or vomiting not controlled, further management needed; see plan    Hydration Status: adequate    Post assessment: no apparent anesthetic complications

## 2013-04-15 NOTE — Progress Notes (Signed)
RN Transport Nurse:    Patient transported to MRI via stretcher with cardiac monitoring in place. Patient has been slightly nauseated, so basin brought in case. Patient states she has yet to vomit though. Patient slid to MRI table without issues and connected to O2 monitoring. Patient tolerated MRI well without issues. Patient transferred back to [facility] and settled in room, made comfortable, and reconnected to bedside cardiac monitoring. Bed in low position; call bell within reach. Overall transfer went well without issues. Bedside report given to [facility], RN.

## 2013-04-15 NOTE — Plan of Care (Addendum)
Patient stable overnight, she has been having nausea with dry heaves - unable to tolerate any significant movement (tolerated only small changes in position), lights, and PO intake, all PO meds held d/t nausea/dry heaves, she has LR at 100 ml/hr infusing d/t lack of PO intake,  she was given a dose of zofran early in the shift with no relief, Dr. Georga Hacking Paged and orders given to give another dose of zofran and a one time dose of decadron - this seems to have improved her nausea but not alleviate it completely, attempted to lay pt. Flat in bed to see if she could tolerate MRI but she was unable to, neuro checks Q2 hrs unchanged - see doc flowsheets, pt. Has denied pain all shift, with her dry heaves she has minimal SSG thin fluid coming out from her nose - Dr. Georga Hacking aware, foley to gravity, left radial a-line d/c'd, no fall or safety events overnight.    PLAN: neuro checks Q2 hrs, monitor and treat for pain and nausea, adv. Diet as tolerated, MRI when pt. Able to tolerate being flat, maintain fall and safety precautions per protocol.

## 2013-04-15 NOTE — Progress Notes (Addendum)
Pt is a 62yo F POD1 s/p right translab vestibular schwannoma resection.    24h: No salty taste, no rhinorrhea.  Admits to biting lower lip.  N/V.  No improvement on zofran, decadron.  One dose of valium in the PACU, none since.  Has refused to move from bed to facilitate getting MRI 2/2 n/v.    BP 144/65  Pulse 90  Temp 98 F (36.7 C) (Axillary)  Resp 17  Ht 1.702 m (5\' 7" )  Wt 66.4 kg (146 lb 6.2 oz)  BMI 22.92 kg/m2  SpO2 97%  Sluggish, in mild distress 2/2 nausea  Left beat nystagmus.  Chemosis of left eye  Mastoid dressing in place, no bleeding  No rhinorrhea  Edema of lower lip      Lab 04/14/13 1515   WBC 11.64*   HGB 12.5   HCT 38.0   PLT 307       Pt is a 62yo F POD1 s/p right translab.  Doing well, with exception of intractable nausea, which is likely 2/2 acute vestibular loss.  I encouraged the MRI to the patient.  Hopefully, valium and anticholengic will be sufficient to improve acute vertigo/nausea that she can tolerate movement to a bed and to obtain MRI.  MRI head important for post-operative eval of lesion and to assess nausea and lethargy.    --Valium prn vertigo  --Scopolamine patch  --MRI head  --Will follow chemosis of left eye for now  --Will hold on vestibular PT until pt does better with vertigo  --Will hold on transfer to floor until results of MRI    Wesley Desanctis, PGY5  Staff: Dr. Antony Salmon    Addendum:  Given chance of skin burn with transdermal patch in MRI, will not give scopolamine patch.  Will give phenergan x1 (which has some vestibular suppressant qualities as well as anti-cholinergic qualities).

## 2013-04-15 NOTE — Progress Notes (Signed)
Pt AOx4. Q2 neuro checks without change. Moves all extremities. PERRLA. Lungs CTA but diminished in bases. NSR in 70-80s but SBP is 150s. Pt c/o nausea throughout the day and has been given zofran and valium Q4 around the clock to control it. ENT aware. Pt has foley and is draining clear yellow urine. Pt did not have BM today. Pt refused all meds today due to nausea, physician aware. Pt had MRI around 2-3pm and was escorted by transport nurse. Pt did not experience any issues during MRI. Family members visited. Pt c/o dry mouth and was given a cup of water and drank reluctantly. No swallowing issues. Will continue to monitor patient neuro status Q2. Will encourage advancement of diet. Will update ENT Erbele with changes in status.

## 2013-04-15 NOTE — Progress Notes (Signed)
NEUROSURGERY DAILY PROGRESS NOTE    Date Time: 04/15/2013 7:04 AM  Patient Name: [facility]  Consulting Attending Physician: Dr. Hermenia Bers    Assessment:   63 y.o. female w/ right sided hearing loss, tinnitus and balance problems, 1 Day Post-Op s/p translabyrinthine resection of 7mm suspected vestibular schwannoma.  Nausea and dizziness overnight.    Plan:   1. Decadron seemed to help some overnight, may need to schedule, also increased her zofran  2. Monitor for CSF leak, no evidence of drainage at this time, denies salty taste    Overnight Events/Subjective:   Nausea and dizziness.    Medications:     Current Facility-Administered Medications   Medication Dose Route Frequency   . calcium citrate-Vitamin D  1 tablet Oral Daily   . ceFAZolin  1 g Intravenous Q8H SCH   . [COMPLETED] ceFAZolin       . [COMPLETED] dexamethasone  4 mg Intravenous Once   . docusate sodium  100 mg Oral BID   . nortriptyline  75 mg Oral QHS   . pantoprazole  40 mg Oral QAM AC   . [DISCONTINUED] ceFAZolin  1 g Intravenous Q8H [facility]         Physical Exam:     Filed Vitals:    04/15/13 0700   BP: 135/65   Pulse: 96   Temp:    Resp: 15   SpO2: 97%       Intake and Output Summary (Last 24 hours) at Date Time    Intake/Output Summary (Last 24 hours) at 04/15/13 0704  Last data filed at 04/15/13 0700   Gross per 24 hour   Intake 6128.34 ml   Output   2535 ml   Net 3593.34 ml         Physical Exam:  Blood pressure 135/65, pulse 96, temperature 97.9 F (36.6 C), temperature source Axillary, resp. rate 15, height 1.702 m (5\' 7" ), weight 66.4 kg (146 lb 6.2 oz), SpO2 97.00%.    General: No acute distress; Awake, Alert, Oriented x3 w/ normal speech and language  CV: Hemodynamically Stable  Lungs: No acute respiratory distress  Neurologic:  [id number]= GCS15  PERRL, EOMI w/ lateral nystag  Able to close eyes well, puff cheeks and smile, slight assymetry at rest  MAE w/ good strength  Dressing is dry and intact  No drainage from nares    Labs:   CBC:    Recent Labs   Basename 04/14/13 1515    WBC 11.64*    HGB 12.5    HCT 38.0    PLT 307     BMP: Recent Labs   Basename 04/14/13 1515    NA 138    K 4.2    CL 106    CO2 23    BUN 12.0    CA 8.8    MG --    PHOS --     Coagulation: No results found for this basename: PROTIME:2,INR:2,PTT:2 in the last 72 hours    Signed by:   Ivin Booty T. Alexandru Moorer, DO  Neurosurgery PGY-3  04/15/2013 7:04 AM

## 2013-04-16 MED ORDER — DIPHENHYDRAMINE HCL 25 MG PO CAPS
25.0000 mg | ORAL_CAPSULE | Freq: Every evening | ORAL | Status: DC | PRN
Start: 2013-04-16 — End: 2013-04-21
  Administered 2013-04-17: 25 mg via ORAL
  Filled 2013-04-16: qty 1

## 2013-04-16 MED ORDER — PROMETHAZINE HCL 25 MG/ML IJ SOLN
6.2500 mg | Freq: Four times a day (QID) | INTRAMUSCULAR | Status: DC | PRN
Start: 2013-04-16 — End: 2013-04-18
  Administered 2013-04-16: 6.25 mg via INTRAVENOUS
  Filled 2013-04-16: qty 1

## 2013-04-16 MED ORDER — ZOLPIDEM TARTRATE 5 MG PO TABS
2.5000 mg | ORAL_TABLET | Freq: Every evening | ORAL | Status: DC | PRN
Start: 2013-04-16 — End: 2013-04-21
  Administered 2013-04-17 – 2013-04-18 (×2): 2.5 mg via ORAL
  Filled 2013-04-16 (×3): qty 1

## 2013-04-16 NOTE — Progress Notes (Signed)
COMMENTS/NOTES: SW met with pt at bedside and introduced self/role. SW completed the initial discharge planning assessment, answered all questions and agreed to follow for dispo needs.      Case Management Initial Discharge Planning Assessment    Psychosocial/Demographic Information   Name of interviewee: Pt at bedside   Healthcare Decision Maker (HDM) (if other than the patient) N/A   HDM - Relationship to Patient N/A   HDM - Contact Information N/A   Pt lives with Spouse    Type of residence where patient lives [place], [facility] in [place],, [place]   DME / Assistive devices at home Pt does not own any DME   Prior level of functioning (ambulation & ADLs) Independent    Correct Insurance listed on face sheet - verified with the patient/HDM [employer] and [employer]   Any additional emergency contacts? Smitty Cords (spouse) [phone]   Does the patient have an Advance Directive?If not, 5 Wishes or Information given? Pt does not have AD.    Is the POA/Guardianship documentation in shadow chart? (if applicable)  No   Source of Income (SSDI. SSI. Social Security, pension, employment, Catering manager) Retail banker in Place  Name of Primary Care Physician verified in patient banner (update in patient banner if not listed).   Dr. Juel Burrow   What DME does the patient currently own? (rolling walker, hospital bed, home O2, BiPAP/CPAP, bedside commode, cane, hoyer lift) Pt does not own any DME    Has the patient been to an Acute Rehab or SNF in the past?  If so, where? No, pt has not been to AR or SNF   Does the patient currently have home health or hospice/palliative services in place?  If so, list agency name. No, pt does not have HH/Hospice/Palliative services in place   Does the patient already have community dialysis set up?  If so, where? No, pt does not require HD      Readmission Assessment (put "N/A" if not applicable)  LACE Score 2   Is this patient an inpatient to inpatient 30 day readmission? No    Does the patient have difficulty obtaining his/her medications? No   Follow-up appointment made with: [facility], [employer] [facility] or private PCP No   Does the patient have difficulty getting to his/her physician appointments? No     Reason for Readmission N/A   Attending notified of pt's readmission? N/A   Plan of care and expected LOS discussed with attending MD: N/A   CM Comment updated to reflect inpatient readmission N/A     Anticipated Discharge Plan  Discussed Anticipated Discharge Date and Discharge Disposition Possibilities with: _X__Patient   ___Healthcare Decision Maker  ___Other   Anticipated Disposition: Option A Home w/Vestibular PT   Anticipated Disposition: Option B TBD   Who will transport the patient when ready for discharge? (offer wheelchair [place] service if patient/family cannot identify transport plan) Family will provide transportation   If applicable, were SNF or Hospice choices provided? N/A   Palliative Care Consult needed? (if yes, contact attending MD)  N/A   Geriatrics Consult needed? (if yes, contact attending MD) N/A   Elderlink Referral needed? (if yes, refer through [facility]) N/A   TCM Referral needed? (if yes, refer through [facility]) N/A   PACE Referral needed? (if yes, refer through [facility]) N/A   Are there any potential barriers to discharge identified?      ___Lack of Insurance  ___Lack of  Health Literacy  ___Undocumented  ___No resources for meds or medical care  ___Transportation issues  ___Language/Cultural/Spiritual  ___Cognitive level / capacity  ___Psychiatric or substance abuse issues  ___Co-morbidities  ___Potential abuse or neglect  ___Safety issues in the home  ___Potential placement issues  ___Pt / family disagreement with d/c plan  ___Lack of family support  ___Lack of extended family / friend support  ___Home Estate agent (multi-level home/access          issues)   _X__ NONE     Inpatient Medicare/Medicare HMO Patients Only  Was an initial IMM signed within 24 hours of  admission?  (Look in Media Tab, Documents Table or Shadow Chart) N/A     Uninsured Patients Only  If patient has a spouse, does your spouse have insurance under his/her place of employment? N/A   Did the patient sign up for insurance through the Affordable Care Act? N/A     Heather Roberts, MSW, LGSW  Clinical Social Worker II  [phone]

## 2013-04-16 NOTE — Progress Notes (Signed)
Vestibular PT will be assigned to see patient tomorrow. Contacted PT scheduling at [phone] and confirmed.     Sherlynn Carbon  Clinical Case Manager, RN, BSN  [facility]  [phone]

## 2013-04-16 NOTE — Plan of Care (Signed)
Patient unable to take PO medications due to nausea. Valium and zofran given at 2037, and phenergan and morphine given at 0033. Patient rested well the rest of the night. Dressing to head clean, dry and intact. Urine output >100/hr; no bowel movement this shift. VSS.

## 2013-04-16 NOTE — Discharge Instructions (Signed)
Home Health Discharge Information     Your doctor has ordered Physical Therapy in-home service(s) for you while you recuperate at home, to assist you in the transition from hospital to home.      The agency that you or your representative chose to provide the service:  Name of Home Health Agency: [facility] [phone]    The above services were set up by:  Romualdo Bolk, RN Zollie Scale  [facility] Liaison)   Phone     [phone]

## 2013-04-16 NOTE — Progress Notes (Signed)
Home Health Referral          Referral from Heather Roberts, SW (Case Manager) for home health care upon discharge.    By [employer], the patient has the right to freely choose a home care provider.  Arrangements have been made with:     A company of the patients choosing. We have supplied the patient with a listing of providers in your area who asked to be included and participate in Medicare.   [facility], a home care agency that provides both adult home care services which is a wholly owned and operated by [employer] and participates in [employer]   The preferred provider of your insurance company. Choosing a home care provider other than your insurance company's preferred provider may affect your insurance coverage.    The Home Health Care Referral Form acknowledging the voluntary selection of the home care company has been completed, signed, and is on file.      Home Health Discharge Information     Your doctor has ordered Physical Therapy in-home service(s) for you while you recuperate at home, to assist you in the transition from hospital to home.      The agency that you or your representative chose to provide the service:  Name of Home Health Agency: [facility] [phone]    The above services were set up by:  Romualdo Bolk, RN Zollie Scale  [facility] Liaison)   Phone     [phone]                                              Signed by: Romualdo Bolk  Date Time: 04/16/2013 5:00 PM

## 2013-04-16 NOTE — Consults (Signed)
Consult received by SW for Vestibular Home PT. SW placed referral to [name], [phone]). SW will follow for additional dispo needs.     Heather Roberts, MSW, LGSW  Clinical Social Worker II  [phone]

## 2013-04-16 NOTE — Progress Notes (Signed)
Pt is a 62yo F POD2 s/p right translab vestibular schwannoma resection.     24h: Improve vertigo, improved n/v    BP 157/74  Pulse 88  Temp 98.2 F (36.8 C) (Oral)  Resp 18  Ht 1.702 m (5\' 7" )  Wt 63.5 kg (139 lb 15.9 oz)  BMI 21.92 kg/m2  SpO2 100%  AAA, NAD  Left beat nystagmus, follows Alexander's law. chemosis improved  Mastoid dressing in place, no bleeding   No rhinorrhea   FN intact; HB1    A/P:  POD2 s/p right translab, with improving nausea/vomitting.  --Transfer to floor  --Discontinue foley  --OOBTC  --Vestibular PT  --Dressing down  --Discontinue valium  --Titrate phenergan  --Stool softener  --Pain control  --If doing well, possible discharge next 2-3 days    Wesley Desanctis, [place]  D/w Staff: Antony Salmon

## 2013-04-16 NOTE — Progress Notes (Addendum)
Pt BP 150-160, VSS on RA. Neuro checks q2, no acute changes, no deficits. PERRLA, A&O x 4, follows command, MAE. C/O 5/10 pain in back, neck, head, given morphine x 1, with good effects. C/O nausea given phenergan x 1, with good effects (pt states phenergan better controls nausea than zofran). Pt educated about infection prevention and need to remove foley, pt refuses to have foley removed. Called Dr. Jaynie Collins and Dr. Santina Evans, MDs stated that it was fine to leave Foley in until tomorrow. Asked MD to d/c order. Foley remains with AUO. Pt was anxious to get OOB, but when sitting pt up in bed with assistance, pt tolerated sitting up for a minute and c/o vertigo and nausea and wanted to be put down. Pt refused further offers to sit up and ambulate. Pt states that a MD and vestibular therapist told her that she should not get OOB. Clarified with MD that the team does want her to slowly work up to ambulating. Pt refused PO intake and meds.     Tx to Stroke 6-7, report given to Marylu Lund, Charity fundraiser. Pt left unit @ 1900 with transport and all belongings.

## 2013-04-17 LAB — GLUCOSE WHOLE BLOOD - POCT: Whole Blood Glucose POCT: 98 mg/dL (ref 70–100)

## 2013-04-17 NOTE — Plan of Care (Signed)
Pt is a&ox4, no weakness or numbness, with right facial droop, able to verbalize needs, no swallowing difficulties, denies any pain. C/o nausea, PRN Zofran given. Pt refused to get up because of nausea. Foley catheter removed at 0630, will watch for urinary retention. Will continue to  monitor and ensure safety via hourly rounding.

## 2013-04-17 NOTE — Plan of Care (Signed)
Shift event: around noon, pt began leaking fluid from right nare when in sitting or standing position but non while lying down:(sample collected) Beta 2 transferrin sent to lab awaiting results.    Pt. Is A&O x 4, FC, MAE x 4, no neuro deficits noted except for slight balance issues with ambulation and pt. Feeling "weird" with change in position. She denies dizziness, CP, SOB, or HA. VSS, afebrile, x 1 person assist with ambulation OOB to BR, pt. Also ambulated with PT in hallway and stairs. Sat at edge of bed at least 4 times and stood up at least twice per MD order. Pt. Has tolerated clear liquid, full liquid an mechanical soft diet, will advance to reg. Diet starting 1/25 @ break fast. Family at [facility], included in plan of care. Will continue to monitor, maintain safety and follow plan of care.

## 2013-04-17 NOTE — PT Eval Note (Signed)
[facility]   Physical Therapy Evaluation   Patient: Audrey Ray    MRN#: [id number]   Unit: [facility]  Bed: F-6/.07    Discharge Recommendations:   Discharge Recommendation: Home with home health PT (vestibular)  DME Recommendation: DME Recommended for Discharge:  (in place- if needed)      Assessment:   Jalina Blowers is a 63 y.o. female admitted 04/14/2013.  Pt presents w/ typical post schwanoma resection issues, dizziness, imbalance, nausea    Impairments: Assessment: Decreased functional mobility;Decreased balance;Gait impairment;Decreased endurance/activity tolerance.     Therapy Diagnosis: gait imbalance; unilateral vestibular loss  Rehabilitation Potential:  good    Treatment Activities: eval, initiated vestibular ex:  VOR x1 seated (3 directions); horizontal and vertical head turns to targets; balcne ex n corner - ft. Together EO  Then [facility] w/ supervision, and w/ heads turns (horizontal and vertical)  Educated the patient to role of physical therapy, plan of care, goals of therapy and safety with mobility and ADLs.    Plan:         PT Frequency: 3-4x/wk   Risks/Benefits/POC Discussed with Pt/Family: With patient        Precautions and Contraindications:   Weight Bearing Status: no restrictions  Other Precautions:  (fall)    Consult received for [name] for PT Evaluation and Treatment.  Patient's medical condition is appropriate for Physical therapy intervention at this time.    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 female admitted on 04/14/2013 POD#3 s/p right translab vestibular schwannoma resection    Past Medical/Surgical History:  None in chart    X-Rays/Tests/Labs:  MRI Inner Auditory Canals /  Impression:   Postoperative changes in the right temporal bone. No  definite residual tumor is identified.      Social History:   Prior Level of Function:  Prior level of  function: Ambulates independently  Baseline Activity Level: Community ambulation  DME Currently at Home: Front wheel walkerIndependent w/o device in home w/ stairs    Home Living Arrangements:  Living Arrangements: Spouse/significant other  DME Currently at Home: Front wheel walker    Subjective:   Patient is agreeable to participation in the therapy session.     Patient Goal:  (less dizziness)    Pain Assessment  Pain Assessment: No/denies pain    Objective:   Observation of Patient/Vital Signs:  Patient is seated at edge of bed with iv access, some shoulder elevation moving very guardedly       Cognition  Arousal/Alertness: Appropriate responses to stimuli  Orientation Level: Oriented X4  Following Commands: Follows all commands and directions without difficulty         Musculoskeletal Examination:  Gross ROM  Right Upper Extremity ROM: within functional limits  Left Upper Extremity ROM: within functional limits  Right Lower Extremity ROM: within functional limits  Left Lower Extremity ROM: within functional limits    Gross Strength  Right Upper Extremity Strength: within functional limits  Left Upper Extremity Strength: within functional limits  Right Lower Extremity Strength: within functional limits  Left Lower Extremity Strength: within functional limits     sensation-  Intact    No spontaneous nystagmus, note left beating nystagmus w/ gaze    Functional Mobility:  Scooting to EOB: Independent  Sit to Supine: Independent  Sit to Stand:  (cga)  Stand to Sit:  Stand by assistance         Ambulation:  Ambulation: minimal assistance (w/o AD x 75 ft w/ a slow guarded mildly unsteady gait)  Stair Management:  (x 2 w/ rail w/ cga; husband instructed in guarding)     Balance:  Balance:  (sitting-good; standing- fair-sba-cga)    Participation and Activity Tolerance:  Participation Effort: good  Endurance: Tolerates < 10 min exercise, no significant change in vital signs      Patient left with call bell within reach, all  needs met and all questions answered, RN notified of session outcome and patient response.      Goals:   Goals  Goal Formulation: With patient  Time for Goal Acheivement: 5 visits  Pt Will Ambulate: 101-150 feet;with rolling walker;With stand by assist (or w/o device w/ no LOB)  Pt Will Perform Home Exer Program: Independently (but slowly)       Time of treatment:   PT Received On: 04/17/13  Start Time: 0845  Stop Time: 0930  Time Calculation (min): 45 min    Delcie Roch, PT;  pgr [phone]

## 2013-04-17 NOTE — Plan of Care (Addendum)
Pt is a&o x 4, able to verbalize needs, no swallowing difficulties, ambulates with standby assist, with mild headache, tolerable as verbalized by the pt, no c/o nausea/vomiting. Will watch out for continuous nasal drainage, noted 1 occurrence when pt went to the bathroom, minimal amount. Pt requested for PRN Ambien, pt slept the whole night. Will continue to monitor and ensure safety via hourly rounding.

## 2013-04-18 NOTE — Progress Notes (Signed)
Pt is a 62yo F POD4 s/p right translab vestibular schwannoma resection.     24h: Improve vertigo, improved n/v.  Started vestibular PT.  Rhinorrhea yesterday with ambulation, collected for b2 transferrin.  No salty taste.    BP 131/71  Pulse 80  Temp 96.8 F (36 C) (Oral)  Resp 18  Ht 1.702 m (5\' 7" )  Wt 63.5 kg (139 lb 15.9 oz)  BMI 21.92 kg/m2  SpO2 98%  AAA, NAD   Mild Left beat nystagmus, follows Alexander's law  Erythema of incision inferiorly  No active rhinorrhea   FN intact; HB1    A/P:   POD4 s/p right translab, with CSF rhinorrrhea.  OR tomorrow for mastoid obliteration.    --NPO after midnight for OR tomorrow  --OOB TID  --Cont Vestibular PT   --Discontinue phenergan  --Stool softener     Wesley Desanctis, PGY5   D/w Staff: Antony Salmon

## 2013-04-18 NOTE — Plan of Care (Signed)
Pt. Complained of pain on R. Side of the head (R. Ear) pain well controlled with percocet. No other issues today.

## 2013-04-18 NOTE — PT Progress Note (Signed)
[facility]   Physical Therapy Treatment  Patient:  Audrey Ray MRN#:  [id number]  Unit: [facility]  Bed: F-6/.07    Discharge Recommendations:   D/C Recommendations: Home with home health PT   DME Recommendations: none-patient has DME (RW) in place    Assessment:   Patient seen this p.m. Bedside with husband present. Patient demonstrates Supervision for all functional mobility/activity. Patient has met all functional goals and does not demonstrate further acute PT needs. Patient discharged from PT services with recommendation that patient is safe to return home with assistance and HHPT services.     Treatment Activities: gait training    Educated the patient to role of physical therapy, plan of care, goals of therapy and safety with mobility and ADLs, home safety.    Plan:   PT Frequency: 3-4x/wk    Discharge from PT Acute Care Services.       Precautions and Contraindications:   Activity as tolerated    Updated Medical Status/Imaging/Labs: Reviewed. Patient with + CSF leak- set for ablation tomorrow a.m. Patient cleared however to continue with mobility at this time.     Subjective:   Patient's medical condition is appropriate for Physical Therapy intervention at this time.  Patient is agreeable to participation in the therapy session. Nursing clears patient for therapy.    Pain:   Scale: 0/10    Objective:   Patient received in bed with PIV/SCD in place.    Cognition  A and O x 4    Functional Mobility  Rolling: Independent  Supine to Sit: Independent  Scooting: Independent  Sit to Stand: Independent  Transfers: Independent    Ambulation  Level of Assistance required: Supervision  Ambulation Distance: 200 feet  Pattern: very slow cadence with min head turns noted. No LOB noted however.   Device Used: none  Weightbearing Status: FWB x 4  Stair Management: mod I  Number of Stairs: 4 steps with R HR step to gait.     Balance  Static Sitting: Good  Dynamic Sitting: Good  Static Standing: Fair  +  Dynamic Standing: Fair -    Therapeutic Exercises  Patient states independent with HEP focused on vestibular rehab. Patient able to verbalize exercises to therapist.     Patient Participation: Excellent  Patient Endurance: unremarkable-states min fatigue upon completion of activity.    Patient left with call bell within reach, all needs met and all questions answered, RN notified of session outcome and patient response.     Goals:  Goals  Goal Formulation: With patient  Time for Goal Acheivement: 5 visits  Pt Will Ambulate: Goal met  Pt Will Perform Home Exer Program: Goal met    Time of Treatment:  PT Received On: 04/18/13  Start Time: 1235  Stop Time: 1305  Time Calculation (min): 30 min  Treatment # 1 out of 5 visits

## 2013-04-18 NOTE — Plan of Care (Signed)
Shift event: nasal drainage when pt. Stands up but no longer when sitting up. Pt. Will be NPO after midnight for OR tomorrow.    Pt. Is A&O x 4, FC, MAE x 4, no neuro deficits noted except for slight balance issues with ambulation and pt. Feeling "weird" with change in position. She denies dizziness, CP, SOB, or HA. VSS, afebrile, x 1 person assist with ambulation OOB to BR, pt. Also ambulated with PT in hallway and stairs. Sat at edge of bed at least 4 times and stood up at least twice per MD order. Pt. Now on  reg. Diet starting 1/25 @ break fast. Family at [facility], included in plan of care. Will continue to monitor, maintain safety and follow plan of care

## 2013-04-19 ENCOUNTER — Inpatient Hospital Stay: Payer: Enrolled Prime—HMO | Admitting: Anesthesiology

## 2013-04-19 ENCOUNTER — Encounter: Payer: Self-pay | Admitting: Anesthesiology

## 2013-04-19 ENCOUNTER — Encounter
Admission: RE | Disposition: A | Discharge status: Home Health | Payer: Self-pay | Source: Ambulatory Visit | Attending: Otolaryngology

## 2013-04-19 HISTORY — PX: TYMPANOPLASTY, EXPLORATION , REMOVAL CHOLESTEATOMA: SHX5653

## 2013-04-19 LAB — BETA 2 TRANSFERRIN: B2-Transferrin: DETECTED — AB

## 2013-04-19 SURGERY — TYMPANOPLASTY, EXPLORATION , REMOVAL CHOLESTEATOMA
Anesthesia: Anesthesia General | Site: Ear | Laterality: Right | Wound class: Clean Contaminated

## 2013-04-19 MED ORDER — HYDROMORPHONE HCL PF 1 MG/ML IJ SOLN
INTRAMUSCULAR | Status: AC
Start: 2013-04-19 — End: 2013-04-19
  Administered 2013-04-19: 0.5 mg via INTRAVENOUS
  Filled 2013-04-19: qty 1

## 2013-04-19 MED ORDER — MICROFIBRILLAR COLL HEMOSTAT EX PADS
MEDICATED_PAD | CUTANEOUS | Status: DC | PRN
Start: 2013-04-19 — End: 2013-04-19
  Administered 2013-04-19: 1 via TOPICAL

## 2013-04-19 MED ORDER — PROPOFOL 10 MG/ML IV EMUL
INTRAVENOUS | Status: AC
Start: 2013-04-19 — End: ?
  Filled 2013-04-19: qty 20

## 2013-04-19 MED ORDER — PROMETHAZINE HCL 25 MG/ML IJ SOLN
6.2500 mg | Freq: Once | INTRAMUSCULAR | Status: DC | PRN
Start: 2013-04-19 — End: 2013-04-19

## 2013-04-19 MED ORDER — HYDROMORPHONE HCL PF 1 MG/ML IJ SOLN
0.5000 mg | INTRAMUSCULAR | Status: DC | PRN
Start: 2013-04-19 — End: 2013-04-19
  Administered 2013-04-19: 0.5 mg via INTRAVENOUS

## 2013-04-19 MED ORDER — SODIUM CHLORIDE 0.9 % IR SOLN
Status: DC | PRN
Start: 2013-04-19 — End: 2013-04-19
  Administered 2013-04-19: 1000 mL

## 2013-04-19 MED ORDER — DEXAMETHASONE SODIUM PHOSPHATE 20 MG/5ML IJ SOLN
INTRAMUSCULAR | Status: AC
Start: 2013-04-19 — End: ?
  Filled 2013-04-19: qty 5

## 2013-04-19 MED ORDER — LIDOCAINE HCL (PF) 2 % IJ SOLN
INTRAMUSCULAR | Status: AC
Start: 2013-04-19 — End: ?
  Filled 2013-04-19: qty 5

## 2013-04-19 MED ORDER — FAMOTIDINE 10 MG/ML IV SOLN (WRAP)
INTRAVENOUS | Status: DC | PRN
Start: 2013-04-19 — End: 2013-04-19
  Administered 2013-04-19: 20 mg via INTRAVENOUS

## 2013-04-19 MED ORDER — SODIUM CHLORIDE 0.9 % IV MBP
1.0000 g | Freq: Three times a day (TID) | INTRAVENOUS | Status: DC
Start: 2013-04-19 — End: 2013-04-21
  Administered 2013-04-19 – 2013-04-21 (×7): 1 g via INTRAVENOUS
  Filled 2013-04-19: qty 1000
  Filled 2013-04-19: qty 50
  Filled 2013-04-19 (×6): qty 1000

## 2013-04-19 MED ORDER — FENTANYL CITRATE 0.05 MG/ML IJ SOLN
25.0000 ug | INTRAMUSCULAR | Status: DC | PRN
Start: 2013-04-19 — End: 2013-04-19

## 2013-04-19 MED ORDER — FENTANYL CITRATE 0.05 MG/ML IJ SOLN
INTRAMUSCULAR | Status: AC
Start: 2013-04-19 — End: ?
  Filled 2013-04-19: qty 2

## 2013-04-19 MED ORDER — LACTATED RINGERS IV SOLN
INTRAVENOUS | Status: DC
Start: 2013-04-19 — End: 2013-04-19
  Administered 2013-04-19: 1000 mL via INTRAVENOUS

## 2013-04-19 MED ORDER — MIDAZOLAM HCL 2 MG/2ML IJ SOLN
INTRAMUSCULAR | Status: AC
Start: 2013-04-19 — End: ?
  Filled 2013-04-19: qty 2

## 2013-04-19 MED ORDER — FAMOTIDINE 20 MG/2ML IV SOLN
INTRAVENOUS | Status: AC
Start: 2013-04-19 — End: ?
  Filled 2013-04-19: qty 2

## 2013-04-19 MED ORDER — SCOPOLAMINE 1 MG/3DAYS TD PT72
1.0000 | MEDICATED_PATCH | TRANSDERMAL | Status: DC
Start: 2013-04-19 — End: 2013-04-19

## 2013-04-19 MED ORDER — LIDOCAINE HCL 2 % IJ SOLN
INTRAMUSCULAR | Status: DC | PRN
Start: 2013-04-19 — End: 2013-04-19
  Administered 2013-04-19: 100 mg

## 2013-04-19 MED ORDER — OXYCODONE-ACETAMINOPHEN 5-325 MG PO TABS
1.0000 | ORAL_TABLET | Freq: Once | ORAL | Status: DC | PRN
Start: 2013-04-19 — End: 2013-04-19

## 2013-04-19 MED ORDER — PROPOFOL INFUSION 10 MG/ML
INTRAVENOUS | Status: DC | PRN
Start: 2013-04-19 — End: 2013-04-19
  Administered 2013-04-19: 200 mg via INTRAVENOUS

## 2013-04-19 MED ORDER — MEPERIDINE HCL 25 MG/ML IJ SOLN
25.0000 mg | INTRAMUSCULAR | Status: DC | PRN
Start: 2013-04-19 — End: 2013-04-19

## 2013-04-19 MED ORDER — CEFAZOLIN 1 GM MBP (CNR)
Status: AC
Start: 2013-04-19 — End: 2013-04-19
  Filled 2013-04-19: qty 50

## 2013-04-19 MED ORDER — ONDANSETRON HCL 4 MG/2ML IJ SOLN
INTRAMUSCULAR | Status: DC | PRN
Start: 2013-04-19 — End: 2013-04-19
  Administered 2013-04-19: 4 mg via INTRAVENOUS

## 2013-04-19 MED ORDER — LIDOCAINE-EPINEPHRINE 1 %-1:100000 IJ SOLN
INTRAMUSCULAR | Status: DC | PRN
Start: 2013-04-19 — End: 2013-04-19
  Administered 2013-04-19: 4 mL
  Administered 2013-04-19: .8 mL

## 2013-04-19 MED ORDER — LACTATED RINGERS IV SOLN
INTRAVENOUS | Status: DC
Start: 2013-04-19 — End: 2013-04-19

## 2013-04-19 MED ORDER — MIDAZOLAM HCL 2 MG/2ML IJ SOLN
INTRAMUSCULAR | Status: DC | PRN
Start: 2013-04-19 — End: 2013-04-19
  Administered 2013-04-19: 2 mg via INTRAVENOUS

## 2013-04-19 MED ORDER — ONDANSETRON HCL 4 MG/2ML IJ SOLN
4.0000 mg | Freq: Once | INTRAMUSCULAR | Status: DC | PRN
Start: 2013-04-19 — End: 2013-04-19

## 2013-04-19 MED ORDER — DEXAMETHASONE SODIUM PHOSPHATE 4 MG/ML IJ SOLN (WRAP)
INTRAMUSCULAR | Status: DC | PRN
Start: 2013-04-19 — End: 2013-04-19
  Administered 2013-04-19: 8 mg via INTRAVENOUS

## 2013-04-19 MED ORDER — GELATIN ABSORBABLE 100 EX MISC
CUTANEOUS | Status: DC | PRN
Start: 2013-04-19 — End: 2013-04-19
  Administered 2013-04-19: 1 via TOPICAL

## 2013-04-19 MED ORDER — SCOPOLAMINE 1 MG/3DAYS TD PT72
MEDICATED_PATCH | TRANSDERMAL | Status: DC
Start: 2013-04-19 — End: 2013-04-21
  Administered 2013-04-19: 1 via TRANSDERMAL
  Filled 2013-04-19: qty 1

## 2013-04-19 MED ORDER — ONDANSETRON HCL 4 MG/2ML IJ SOLN
INTRAMUSCULAR | Status: AC
Start: 2013-04-19 — End: ?
  Filled 2013-04-19: qty 2

## 2013-04-19 MED ORDER — STERILE WATER FOR IRRIGATION IR SOLN
Status: DC | PRN
Start: 2013-04-19 — End: 2013-04-19
  Administered 2013-04-19: 250 mL

## 2013-04-19 MED ORDER — FENTANYL CITRATE 0.05 MG/ML IJ SOLN
INTRAMUSCULAR | Status: DC | PRN
Start: 2013-04-19 — End: 2013-04-19
  Administered 2013-04-19: 25 ug via INTRAVENOUS
  Administered 2013-04-19: 50 ug via INTRAVENOUS
  Administered 2013-04-19 (×3): 25 ug via INTRAVENOUS

## 2013-04-19 SURGICAL SUPPLY — 43 items
BAND AID STERILE 1X3 (Dressing) ×2 IMPLANT
BLADE AND 60 DEG SHP (Blade) ×2 IMPLANT
BLADE NDL 3MM CUT EDGE (Blade) ×2 IMPLANT
BLADE SURG SAFETYLOCK STRL 11 (Blade) ×2 IMPLANT
DRAPE CRANIOTOMY (Drape) ×2 IMPLANT
DRAPE STERI LARGE W/TOWEL (Drape) ×2 IMPLANT
DRESSING EAR GLASSOCK ADLT (Dressing) ×2 IMPLANT
DRESSING EAR GLASSOCK PED (Dressing) ×2 IMPLANT
DRESSING PETRO 3% BI 3BRM GZE XR 8X1IN (Dressing) ×1
DRESSING PETROLATUM XEROFORM L8 IN X W1 (Dressing) ×1
DRESSING PETROLATUM XEROFORM L8 IN X W1 IN 3% BISMUTH TRIBROMOPHENATE (Dressing) ×1 IMPLANT
DRESSING TRANSPARENT L2 3/4 IN X W2 3/8 (Dressing) ×3
DRESSING TRANSPARENT L2 3/4 IN X W2 3/8 IN POLYURETHANE ADHESIVE (Dressing) ×3 IMPLANT
DRESSING TRNS PU STD TGDRM 2.75INX2 3/8 (Dressing) ×3
ELECTRODE EEG NDL RCD SBDRL (Nervemon) ×2
ELECTRODE EEG NEEDLE SUBDURAL RECORD (Nervemon) ×1 IMPLANT
EYE PAD CURITY 1-5/8X2-5/8IN (Dressing) IMPLANT
GLOVE SRG PLISPRN 7 BGL PI ULTRATOUCH G (Glove) ×1
GLOVE SURGICAL 7 BIOGEL PI ULTRATOUCH G (Glove) ×1
GLOVE SURGICAL 7 BIOGEL PI ULTRATOUCH G POWDER FREE BEAD CUFF (Glove) ×1 IMPLANT
GOWN SRG FBRC XL STD ROYALSILK LF STRL (Gown) ×1
GOWN SURGICAL XL STANDARD FABRIC (Gown) ×1
GOWN SURGICAL XL STANDARD FABRIC ROYALSILK LEVEL 3 NONREINFORCE SET IN (Gown) ×1 IMPLANT
KIT MAJOR EAR FFX (Kits) ×2 IMPLANT
KIT SCT LF STRL Y CNCT LVN IRR FLW REG C (Suction) ×1
KIT SUCTION Y CONNECTOR LEVINE (Suction) ×1
KIT SUCTION Y CONNECTOR LEVINE IRRIGATION FLOW REGULATOR C CLAMP (Suction) ×1 IMPLANT
MASTISOL VIAL 2/3CC STRL (Skin Closure) ×2 IMPLANT
MICROSCOPE DRAPE ZEISS (Drape) ×2 IMPLANT
PACKING NASAL L4 CM X W4 CM DRESSING (Packing) ×1
PACKING NASAL L4 CM X W4 CM DRESSING SINUS STENT HYDRATE MEROGEL HYAFF (Packing) ×1 IMPLANT
PACKING NSL MRGL HYAFF 4X4CM DRSG SIN (Packing) ×1
PAD ELECTROSRG GRND REM W CRD (Procedure Accessories) ×2 IMPLANT
PAK EAR SCHINDLER W/STRING (Packing) ×2 IMPLANT
SLEEVE SEQUEN COMP THIGH MED (Procedure Accessories) ×4 IMPLANT
SOLUTION IRR 0.9% NACL 3L ARTHMTC LF (Irrigation Solutions) ×2
SOLUTION IRRIGATION 0.9% SODIUM CHLORIDE (Irrigation Solutions) ×2 IMPLANT
SUTURE ABS CR 4-0 P-3 MTPS 18IN MFL BRN (Suture) ×1
SUTURE CHROMIC 5-0 P3 18IN (Suture) ×2 IMPLANT
SUTURE CHROMIC GUT CHROMIC 4-0 P-3 L18 (Suture) ×1 IMPLANT
SYRINGE LUER LOCK 10CC (Syringes, Needles) ×4 IMPLANT
WATER STERILE PLASTIC POUR BOTTLE 250 ML (Irrigation Solutions) ×1 IMPLANT
WATER STRL 250ML LF PLS PR BTL (Irrigation Solutions) ×1

## 2013-04-19 NOTE — Anesthesia Postprocedure Evaluation (Deleted)
Anesthesia Post Evaluation  Anesthesia Post Evaluation    Patient: Audrey Ray    Procedures performed: Procedure(s) with comments:  Procedure(s):  TYMPANOPLASTY, EXPLORATION , REMOVAL CHOLESTEATOMA      Last vitals: see PACU record for vitals      Post pain: Patient not complaining of pain, continue current therapy      Mental Status:sedated    Respiratory Function: tolerating face mask    Cardiovascular: stable    Nausea/Vomiting: patient not complaining of nausea or vomiting    Hydration Status: adequate        Post assessment: no apparent anesthetic complications, no reportable events and no evidence of recall

## 2013-04-19 NOTE — Op Note (Signed)
FULL OPERATIVE NOTE    Date Time: 04/19/2013 10:38 AM  Patient Name: Audrey Ray,Audrey Ray  Attending Physician: Judie Bonus, MD PHD      Date of Operation:   04/14/2013 - 04/19/2013    Providers Performing:   Surgeon(s):  Judie Bonus, MD PHD    Asst:  Wesley Desanctis, MD    Veatrice Bourbon, RN - Circulator  Obie Dredge - Scrub Person  Burton Apley - Scrub Person  Judd Gaudier, RN - Team Leader  Vic Ripper, RN - Relief Circulator  Twanna Hy - Relief Scrub    Operative Procedure:   Procedure(s):  Right middle ear obliteration  CSF leak repair    Preoperative Diagnosis:   Pre-Op Diagnosis Codes:     * Ear pain, right [388.70]    Postoperative Diagnosis:   * No post-op diagnosis entered *    Indications:   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 After a timeout was performed, general anesthesia was induced, and an LMA was placed without difficulty.  The right ear was prepped and draped in sterile fashion. The right abdomen was also prepped and draped in sterile fashion    Using a 15 blade, a 1in incision was made in the RLQ through the previous incision. Using bovie electrocautery, a 1cm diameter piece was fat was harvested. Hemostasis was achieved. The incision was closed with 3-0 vicryl deep and 4-0 monocryl subcuticular. The incision was dressed with mastisol and steristrips.     Under the microscope, the ear canal was injected with 0.9cc of 1% lidocaine with 1:100000 epinephrine.   Using a tympanoplasty knife, canal incisions were made, and a tympanomeatal flap was elevated.   The middle ear was entered and inspected. The IS joint was cut and the malleus was removed. CSF seemed to be emanating from the hypotympanum as well as the antrum.  The eustachian tube mucosa was roughened. The eustachian tube was then packed alternately with surgicel and bone wax. The middle ear was filled with fat.  The tympanomeatal flap was  replaced. The edges were covered with gelfoam. The EAC was filled with 2 schidler packs.   The patient was returned to the anesthesia team, reversed from general anesthesia, and transferred to the recovery room.    Estimated Blood Loss:   * No values recorded between 04/19/2013  8:22 AM and 04/19/2013 10:38 AM *    Implants:   * No implants in log *    Drains:   Drains: no    Specimens:   none    Complications:   none      Signed by: Judie Bonus, MD

## 2013-04-19 NOTE — Plan of Care (Addendum)
Pt a&o x4. Moves all extremities. Strength 5/5. Ambulates with standby assist. Slight gait imbalance per report. Vss. Denies pain. 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 R ear incision dry and intact. PRN ambien given before bedtime. Regular diet, NPO after midnight for upcoming surgery. Pt stable. Fall precaution in place. Will continue to monitor.

## 2013-04-19 NOTE — Anesthesia Preprocedure Evaluation (Signed)
Anesthesia Evaluation    AIRWAY    Mallampati: II    TM distance: >3 FB  Neck ROM: full  Mouth Opening:full   CARDIOVASCULAR    cardiovascular exam normal       DENTAL    No notable dental hx     PULMONARY    pulmonary exam normal     OTHER FINDINGS    Lorraine, Cimmino <[id number]> - 63 y.o. Female          Anesthesia History      Post-operative nausea and vomiting Migraine     Stomach ulcer           Surgical History         History History     CESAREAN SECTION HYSTERECTOMY     menicus CRANIOTOMY, TRANS-PETROSAL APPROACH     MASTOIDECTOMY, MODIFIED           Substance History      Smoking Status: Never Smoker      Smokeless Tobacco Status: Unknown     Alcohol use: 0.0 oz per week     Drug use: Not Asked         Problem List      Right acoustic neuroma     Vestibular schwannoma                         Anesthesia Plan    ASA 2     general                     intravenous induction   Detailed anesthesia plan: general LMA and general endotracheal        Post op pain management: PO analgesics    informed consent obtained      pertinent labs reviewed

## 2013-04-19 NOTE — Brief Op Note (Signed)
BRIEF OP NOTE    Date Time: 04/19/2013 10:36 AM    Patient Name:   Audrey Ray    Date of Operation:   04/14/2013 - 04/19/2013    Providers Performing:   Surgeon(s):  Judie Bonus, MD PHD  Wesley Desanctis MD    Assistant (s):   Veatrice Bourbon, RN - Circulator  Obie Dredge - Scrub Person  Burton Apley - Scrub Person  Judd Gaudier, RN - Team Leader  Vic Ripper, RN - Relief Circulator  Twanna Hy - Relief Scrub    Operative Procedure:   Procedure(s):  Middle ear obliteration  Control of CSF leak    Preoperative Diagnosis:   Pre-Op Diagnosis Codes:     * CSF rhinorrhea    Postoperative Diagnosis:   same    Anesthesia:   General    Estimated Blood Loss:   1ml    Implants:   * No implants in log *    Drains:   Drains: no    Specimens:       Findings:   1. CSF in middle ear  2. Incus removed  3. Eustachian tube packed    Complications:   none      Signed by: Dema Severin, MD                                                                           [facility] OR

## 2013-04-19 NOTE — Transfer of Care (Signed)
Anesthesia Transfer of Care Note      Patient: Audrey Ray    Procedures performed: Procedure(s) with comments:  Procedure(s):  TYMPANOPLASTY, EXPLORATION , REMOVAL CHOLESTEATOMA      Last vitals: see PACU record for vitals      Post pain: Patient not complaining of pain, continue current therapy      Mental Status:sedated    Respiratory Function: tolerating face mask    Cardiovascular: stable    Nausea/Vomiting: patient not complaining of nausea or vomiting    Hydration Status: adequate        Post assessment: no apparent anesthetic complications, no reportable events and no evidence of recall

## 2013-04-19 NOTE — Anesthesia Postprocedure Evaluation (Signed)
Anesthesia Post Evaluation  Anesthesia Post Evaluation    Patient: Audrey Ray    Procedures performed: Procedure(s) with comments:  Procedure(s):  TYMPANOPLASTY, EXPLORATION , REMOVAL CHOLESTEATOMA      Last vitals: see PACU record for vitals      Post pain: Patient not complaining of pain, continue current therapy      Mental Status:awake    Respiratory Function: tolerating room air    Cardiovascular: stable    Nausea/Vomiting: patient not complaining of nausea or vomiting    Hydration Status: adequate        Post assessment: no apparent anesthetic complications, no reportable events and no evidence of recall

## 2013-04-19 NOTE — Progress Notes (Signed)
Pt is a 62yo F POD5 s/p right translab vestibular schwannoma resection, now with CSF leak    24h: NAE    BP 146/74  Pulse 87  Temp 98.7 F (37.1 C) (Temporal Artery)  Resp 16  Ht 1.702 m (5\' 7" )  Wt 63.5 kg (139 lb 15.9 oz)  BMI 21.92 kg/m2  SpO2 97%  AAA, NAD   Mild Left beat nystagmus  Erythema of incision inferiorly   No active rhinorrhea   FN intact; HB1     A/P:   POD5 s/p right translab, with CSF rhinorrrhea. OR today for mastoid obliteration.     Wesley Desanctis, PGY5   D/w Staff: Antony Salmon

## 2013-04-20 ENCOUNTER — Encounter: Payer: Self-pay | Admitting: Otolaryngology

## 2013-04-20 MED ORDER — ACETAMINOPHEN 500 MG PO TABS
1000.0000 mg | ORAL_TABLET | Freq: Four times a day (QID) | ORAL | Status: DC | PRN
Start: 2013-04-20 — End: 2013-04-21
  Administered 2013-04-20 – 2013-04-21 (×3): 1000 mg via ORAL
  Filled 2013-04-20 (×3): qty 2

## 2013-04-20 NOTE — Progress Notes (Signed)
Dr. Dickie La notified regarding patients report of pressure relief and scant amount of bloody drainage from right ear.  No new orders received, patient denies dizziness, nausea/vomiting, runny nose, increased pain, or any other abnormal symptoms at this time.     Small amount of dry drainage present in ear canal and on patients right hand (from touching). Patient resting comfortably in bed, HOB elevated at 45 degrees.     OOB to chair for 1 hour this shift as ordered, patient tolerated OOB activity this shift without any complications.

## 2013-04-20 NOTE — Plan of Care (Addendum)
Pt a&o x4. Vss. C/o headache, percocet given x2. Post op, on bedrest. Dome dressing covering (R) ear. RLQ incision intact. Foley in place draining yellow, clear urine. HOB at 35 degree. Pt stable. Fall precaution in place. Will continue to monitor.

## 2013-04-20 NOTE — Progress Notes (Signed)
Pt is a 63yo F POD6 s/p right translab vestibular schwannoma resection, POD1 s/p middle ear obliteration for CSF rhinorrhea    24h: OR yesterday.  No rhinorrhea, no otorrhea.    BP 141/73  Pulse 83  Temp 98.4 F (36.9 C) (Oral)  Resp 18  Ht 1.702 m (5\' 7" )  Wt 63.5 kg (139 lb 15.9 oz)  BMI 21.92 kg/m2  SpO2 98%  AAA, NAD   Erythema of incision inferiorly, improved   No otorrhea  No active rhinorrhea   FN intact; HB1     A/P:   POD6 s/p right translab, POD1 s/p middle ear obliteration. Doing well.  No e/o CSF leak today  --Discontinue foley  --OOBTC no earlier than noon  --Cont HOB at least 30 degrees when in bed  --Okay for shower  --Cont stool softener  --Pain control  --If no e/o of CSF leak, anticipate discharge in 2-3days    Wesley Desanctis, [place]   D/w Staff: Antony Salmon

## 2013-04-21 MED ORDER — DEXAMETHASONE SODIUM PHOSPHATE 4 MG/ML IJ SOLN (WRAP)
8.0000 mg | Freq: Once | INTRAMUSCULAR | Status: AC
Start: 2013-04-21 — End: 2013-04-21
  Administered 2013-04-21: 8 mg via INTRAVENOUS
  Filled 2013-04-21: qty 2

## 2013-04-21 MED ORDER — PREDNISONE 20 MG PO TABS
ORAL_TABLET | ORAL | Status: DC
Start: 2013-04-21 — End: 2014-11-29

## 2013-04-21 MED ORDER — OXYCODONE-ACETAMINOPHEN 5-325 MG PO TABS
ORAL_TABLET | ORAL | Status: DC
Start: 2013-04-21 — End: 2016-07-22

## 2013-04-21 NOTE — Progress Notes (Signed)
R ear deaf; L ear with hearing aide.  R ear with sutures and packing.  Steri-strips on abdomen.  Continent x2.  OOB with 1-person for occasional imbalance.  Plan is to discharge to home.

## 2013-04-21 NOTE — Progress Notes (Addendum)
Pt is a 62yo F POD7 s/p right translab vestibular schwannoma resection, POD2 s/p middle ear obliteration for CSF rhinorrhea     24h: Otorrhea; HTN; Right otalgia.  No post nasal drip.    BP 162/89  Pulse 76  Temp 97.2 F (36.2 C) (Oral)  Resp 16  Ht 1.702 m (5\' 7" )  Wt 63.5 kg (139 lb 15.9 oz)  BMI 21.92 kg/m2  SpO2 98%  AAA, NAD   Erythema of incision   No dry bloody secretions.  No overt CSF   No active rhinorrhea   FN intact; HB1     A/P:   POD7 s/p right translab, POD2 s/p middle ear obliteration. Doing well. No e/o CSF leak today   --OOBTC TID   --Cont HOB at least 30 degrees when in bed   --Okay for shower   --Cont stool softener   --Pain control   --Will review observation (and discharge) versus blind sacking the EAC with staff    Wesley Desanctis, PGY5   D/w Staff: Antony Salmon      Edited above to add VS.

## 2013-04-21 NOTE — Progress Notes (Signed)
Prescriptions for Percocet and Prednisone.  Pt declined [employer].  Pt and husband understand follow-up instructions.  Discharged to home.

## 2013-05-11 NOTE — Discharge Summary (Signed)
Underwent translab approach to right acoustic neuroma 04/14/2013.  Typical postoperative course. Vertigo POD#1 and 2 with rapid improvement thereafter.  Upon mobilization, noted CSF rhinorrhea POD#3. Taken back to OR for middle ear obliteration POD#5.  Discharged POD#6 on abx, pain medicine, antinausea medication.  Limited activity, vestibular therapy.  F/u 1 week in clinic.

## 2013-06-29 ENCOUNTER — Ambulatory Visit: Payer: Enrolled Prime—HMO

## 2013-06-29 NOTE — Pre-Procedure Instructions (Signed)
Faxed Dr. Clyde Canterbury for a dated copy of her  ekg ( prob. Done on 06/22/13 with medical clear). ekg is in epic.

## 2013-07-01 NOTE — Pre-Procedure Instructions (Cosign Needed)
Call from [facility], Dr. Delanna Ahmadi office.  She will fax updated EKG to [facility].

## 2013-07-02 NOTE — Pre-Procedure Instructions (Signed)
Follow up phone message left for PCP office requesting dated copy of ekg.

## 2013-07-05 ENCOUNTER — Ambulatory Visit: Payer: Enrolled Prime—HMO | Admitting: Registered Nurse

## 2013-07-05 ENCOUNTER — Ambulatory Visit: Payer: Enrolled Prime—HMO | Admitting: Otolaryngology

## 2013-07-05 ENCOUNTER — Encounter
Admission: RE | Disposition: A | Discharge status: Home / Self Care | Payer: Self-pay | Source: Ambulatory Visit | Attending: Otolaryngology

## 2013-07-05 ENCOUNTER — Ambulatory Visit
Admission: RE | Admit: 2013-07-05 | Discharge: 2013-07-05 | Disposition: A | Discharge status: Home / Self Care | Payer: Enrolled Prime—HMO | Source: Ambulatory Visit | Attending: Otolaryngology | Admitting: Otolaryngology

## 2013-07-05 ENCOUNTER — Encounter: Payer: Self-pay | Admitting: Registered Nurse

## 2013-07-05 DIAGNOSIS — Z90710 Acquired absence of both cervix and uterus: Secondary | ICD-10-CM | POA: Insufficient documentation

## 2013-07-05 DIAGNOSIS — H903 Sensorineural hearing loss, bilateral: Secondary | ICD-10-CM | POA: Insufficient documentation

## 2013-07-05 DIAGNOSIS — G9601 Cranial cerebrospinal fluid leak, spontaneous: Secondary | ICD-10-CM | POA: Insufficient documentation

## 2013-07-05 DIAGNOSIS — G43909 Migraine, unspecified, not intractable, without status migrainosus: Secondary | ICD-10-CM | POA: Insufficient documentation

## 2013-07-05 HISTORY — PX: MYRINGOPLASTY, FAT GRAFT: SHX4827

## 2013-07-05 HISTORY — PX: MASTOIDECTOMY, MODIFIED: SHX4795

## 2013-07-05 HISTORY — PX: INSERTION, OSSEOINTEGRATED IMP/TEMPORAL BONE/BAHA: SHX4379

## 2013-07-05 SURGERY — MASTOIDECTOMY, MODIFIED
Anesthesia: Anesthesia General | Laterality: Right | Wound class: Clean Contaminated

## 2013-07-05 MED ORDER — FENTANYL CITRATE 0.05 MG/ML IJ SOLN
50.0000 ug | INTRAMUSCULAR | Status: DC | PRN
Start: 2013-07-05 — End: 2013-07-05

## 2013-07-05 MED ORDER — LIDOCAINE HCL 2 % IJ SOLN
INTRAMUSCULAR | Status: DC | PRN
Start: 2013-07-05 — End: 2013-07-05
  Administered 2013-07-05: 100 mg

## 2013-07-05 MED ORDER — FENTANYL CITRATE 0.05 MG/ML IJ SOLN
INTRAMUSCULAR | Status: AC
Start: 2013-07-05 — End: ?
  Filled 2013-07-05: qty 2

## 2013-07-05 MED ORDER — HYDROCODONE-ACETAMINOPHEN 5-325 MG PO TABS
1.0000 | ORAL_TABLET | Freq: Once | ORAL | Status: DC | PRN
Start: 2013-07-05 — End: 2013-07-05

## 2013-07-05 MED ORDER — HYDROMORPHONE HCL PF 1 MG/ML IJ SOLN
0.5000 mg | INTRAMUSCULAR | Status: DC | PRN
Start: 2013-07-05 — End: 2013-07-05

## 2013-07-05 MED ORDER — ONDANSETRON HCL 4 MG/2ML IJ SOLN
INTRAMUSCULAR | Status: AC
Start: 2013-07-05 — End: 2013-07-05
  Administered 2013-07-05: 4 mg via INTRAVENOUS
  Filled 2013-07-05: qty 2

## 2013-07-05 MED ORDER — DEXAMETHASONE SODIUM PHOSPHATE 4 MG/ML IJ SOLN (WRAP)
INTRAMUSCULAR | Status: DC | PRN
Start: 2013-07-05 — End: 2013-07-05
  Administered 2013-07-05: 10 mg via INTRAVENOUS

## 2013-07-05 MED ORDER — PROPOFOL 10 MG/ML IV EMUL
INTRAVENOUS | Status: AC
Start: 2013-07-05 — End: ?
  Filled 2013-07-05: qty 20

## 2013-07-05 MED ORDER — GABAPENTIN 100 MG PO CAPS
ORAL_CAPSULE | ORAL | Status: AC
Start: 2013-07-05 — End: 2013-07-05
  Administered 2013-07-05: 200 mg via ORAL
  Filled 2013-07-05: qty 1

## 2013-07-05 MED ORDER — FAMOTIDINE 10 MG/ML IV SOLN (WRAP)
INTRAVENOUS | Status: DC | PRN
Start: 2013-07-05 — End: 2013-07-05
  Administered 2013-07-05: 20 mg via INTRAVENOUS

## 2013-07-05 MED ORDER — DEXAMETHASONE SODIUM PHOSPHATE 20 MG/5ML IJ SOLN
INTRAMUSCULAR | Status: AC
Start: 2013-07-05 — End: ?
  Filled 2013-07-05: qty 5

## 2013-07-05 MED ORDER — SODIUM CHLORIDE 0.9 % IR SOLN
Status: DC | PRN
Start: 2013-07-05 — End: 2013-07-05
  Administered 2013-07-05: 1000 mL

## 2013-07-05 MED ORDER — OXYCODONE HCL 5 MG PO TABS
ORAL_TABLET | ORAL | Status: AC
Start: 2013-07-05 — End: 2013-07-05
  Administered 2013-07-05: 5 mg via ORAL
  Filled 2013-07-05: qty 1

## 2013-07-05 MED ORDER — GABAPENTIN 100 MG PO CAPS
200.0000 mg | ORAL_CAPSULE | Freq: Once | ORAL | Status: AC
Start: 2013-07-05 — End: 2013-07-05

## 2013-07-05 MED ORDER — ONDANSETRON HCL 4 MG/2ML IJ SOLN
4.0000 mg | Freq: Once | INTRAMUSCULAR | Status: AC | PRN
Start: 2013-07-05 — End: 2013-07-05

## 2013-07-05 MED ORDER — LACTATED RINGERS IV SOLN
INTRAVENOUS | Status: DC
Start: 2013-07-05 — End: 2013-07-05

## 2013-07-05 MED ORDER — ACETAMINOPHEN 500 MG PO TABS
1000.0000 mg | ORAL_TABLET | Freq: Once | ORAL | Status: AC
Start: 2013-07-05 — End: 2013-07-05

## 2013-07-05 MED ORDER — LIDOCAINE HCL (PF) 2 % IJ SOLN
INTRAMUSCULAR | Status: AC
Start: 2013-07-05 — End: ?
  Filled 2013-07-05: qty 5

## 2013-07-05 MED ORDER — DIPHENHYDRAMINE HCL 50 MG/ML IJ SOLN
12.5000 mg | Freq: Once | INTRAMUSCULAR | Status: DC | PRN
Start: 2013-07-05 — End: 2013-07-05

## 2013-07-05 MED ORDER — FENTANYL CITRATE 0.05 MG/ML IJ SOLN
INTRAMUSCULAR | Status: DC | PRN
Start: 2013-07-05 — End: 2013-07-05
  Administered 2013-07-05 (×2): 25 ug via INTRAVENOUS
  Administered 2013-07-05: 100 ug via INTRAVENOUS

## 2013-07-05 MED ORDER — ONDANSETRON HCL 4 MG/2ML IJ SOLN
INTRAMUSCULAR | Status: DC | PRN
Start: 2013-07-05 — End: 2013-07-05
  Administered 2013-07-05: 4 mg via INTRAVENOUS

## 2013-07-05 MED ORDER — PROMETHAZINE HCL 25 MG/ML IJ SOLN
6.2500 mg | Freq: Once | INTRAMUSCULAR | Status: DC | PRN
Start: 2013-07-05 — End: 2013-07-05

## 2013-07-05 MED ORDER — ONDANSETRON HCL 4 MG/2ML IJ SOLN
INTRAMUSCULAR | Status: AC
Start: 2013-07-05 — End: ?
  Filled 2013-07-05: qty 2

## 2013-07-05 MED ORDER — SCOPOLAMINE 1 MG/3DAYS TD PT72
1.0000 | MEDICATED_PATCH | TRANSDERMAL | Status: DC
Start: 2013-07-05 — End: 2013-07-05

## 2013-07-05 MED ORDER — MIDAZOLAM HCL 2 MG/2ML IJ SOLN
INTRAMUSCULAR | Status: DC | PRN
Start: 2013-07-05 — End: 2013-07-05
  Administered 2013-07-05: 2 mg via INTRAVENOUS

## 2013-07-05 MED ORDER — OXYCODONE HCL 5 MG PO TABS
5.0000 mg | ORAL_TABLET | Freq: Four times a day (QID) | ORAL | Status: DC | PRN
Start: 2013-07-05 — End: 2013-07-05

## 2013-07-05 MED ORDER — STERILE WATER FOR IRRIGATION IR SOLN
Status: DC | PRN
Start: 2013-07-05 — End: 2013-07-05
  Administered 2013-07-05: 250 mL

## 2013-07-05 MED ORDER — PROMETHAZINE HCL 25 MG/ML IJ SOLN
INTRAMUSCULAR | Status: DC
Start: 2013-07-05 — End: 2013-07-05
  Filled 2013-07-05: qty 1

## 2013-07-05 MED ORDER — FAMOTIDINE 20 MG/2ML IV SOLN
INTRAVENOUS | Status: AC
Start: 2013-07-05 — End: ?
  Filled 2013-07-05: qty 2

## 2013-07-05 MED ORDER — LIDOCAINE-EPINEPHRINE 1 %-1:100000 IJ SOLN
INTRAMUSCULAR | Status: DC | PRN
Start: 2013-07-05 — End: 2013-07-05
  Administered 2013-07-05: 11.5 mL

## 2013-07-05 MED ORDER — ONDANSETRON HCL 4 MG/2ML IJ SOLN
4.0000 mg | Freq: Once | INTRAMUSCULAR | Status: DC | PRN
Start: 2013-07-05 — End: 2013-07-05

## 2013-07-05 MED ORDER — ACETAMINOPHEN 500 MG PO TABS
ORAL_TABLET | ORAL | Status: AC
Start: 2013-07-05 — End: 2013-07-05
  Administered 2013-07-05: 1000 mg via ORAL
  Filled 2013-07-05: qty 2

## 2013-07-05 MED ORDER — GELATIN ABSORBABLE 100 EX MISC
CUTANEOUS | Status: DC | PRN
Start: 2013-07-05 — End: 2013-07-05
  Administered 2013-07-05: 1 via TOPICAL

## 2013-07-05 MED ORDER — MIDAZOLAM HCL 2 MG/2ML IJ SOLN
INTRAMUSCULAR | Status: AC
Start: 2013-07-05 — End: ?
  Filled 2013-07-05: qty 2

## 2013-07-05 MED ORDER — PROPOFOL INFUSION 10 MG/ML
INTRAVENOUS | Status: DC | PRN
Start: 2013-07-05 — End: 2013-07-05
  Administered 2013-07-05: 150 mg via INTRAVENOUS

## 2013-07-05 MED ORDER — MUPIROCIN 2 % EX OINT
TOPICAL_OINTMENT | CUTANEOUS | Status: DC | PRN
Start: 2013-07-05 — End: 2013-07-05
  Administered 2013-07-05: 1 via TOPICAL

## 2013-07-05 MED ORDER — CEFAZOLIN 1 GM MBP (CNR)
Status: AC
Start: 2013-07-05 — End: 2013-07-05
  Administered 2013-07-05: 1 g via INTRAVENOUS
  Filled 2013-07-05: qty 50

## 2013-07-05 MED ORDER — MICROFIBRILLAR COLL HEMOSTAT EX PADS
MEDICATED_PAD | CUTANEOUS | Status: DC | PRN
Start: 2013-07-05 — End: 2013-07-05
  Administered 2013-07-05: 1 via TOPICAL

## 2013-07-05 MED ORDER — SCOPOLAMINE 1 MG/3DAYS TD PT72
MEDICATED_PATCH | TRANSDERMAL | Status: DC
Start: 2013-07-05 — End: 2013-07-05
  Administered 2013-07-05: 1 via TRANSDERMAL
  Filled 2013-07-05: qty 1

## 2013-07-05 MED ORDER — PHENYLEPHRINE 100 MCG/ML IV BOLUS (ANESTHESIA)
PREFILLED_SYRINGE | INTRAVENOUS | Status: DC | PRN
Start: 2013-07-05 — End: 2013-07-05
  Administered 2013-07-05: 100 ug via INTRAVENOUS

## 2013-07-05 MED ORDER — FENTANYL CITRATE 0.05 MG/ML IJ SOLN
INTRAMUSCULAR | Status: AC
Start: 2013-07-05 — End: 2013-07-05
  Administered 2013-07-05: 50 ug via INTRAVENOUS
  Filled 2013-07-05: qty 2

## 2013-07-05 SURGICAL SUPPLY — 89 items
2.0MM ROUND DIAMOND ×3 IMPLANT
ADHESIVE TISSUE INDERMIL (Skin Closure) ×3 IMPLANT
BAND AID STERILE 1X3 (Dressing) ×3 IMPLANT
BAND RUBBER STRL 3X1/8IN (Procedure Accessories) ×3 IMPLANT
BLADE AND 60 DEG SHP (Blade) ×3 IMPLANT
BLADE CLIPPER ASSEMBLY (Procedure Accessories) ×3 IMPLANT
BLADE ELECTRODE DISP 6IN (Cautery) ×3 IMPLANT
BLADE NDL 3MM CUT EDGE (Blade) ×3 IMPLANT
BLADE S/SU RIBBACK CARB STL 15 (Blade) ×6 IMPLANT
BLADE SURG SAFETYLOCK STRL 11 (Blade) ×3 IMPLANT
CATHETER IV JELCO 14GA 2IN STRL RADOPQ (IV Supply) ×1
CATHETER IV OD14 GA L2 IN RADIOPAQUE (IV Supply) ×2
CATHETER IV OD14 GA L2 IN RADIOPAQUE JELCO (IV Supply) ×2 IMPLANT
COCHLEAR IMPLANT MAGNANT ×3 IMPLANT
CONICAL GUIDE DRILL 3+4MM ×3 IMPLANT
COVER MAYO CNVRT STND 23IN PLS REINF (Drape) ×1
COVER STAND W23 IN REINFORCE PLASTIC (Drape) ×2 IMPLANT
COVER STND PLS MAYO CNVRT 23IN LF STRL (Drape) ×2
DEPRESSOR TONGUE JR BIRCHWOOD (Procedure Accessories) ×6 IMPLANT
DRAPE CRANIOTOMY (Drape) ×3 IMPLANT
DRAPE STERI LARGE W/TOWEL (Drape) ×3 IMPLANT
DRESSING EAR GLASSOCK ADLT (Dressing) ×3 IMPLANT
DRESSING EAR GLASSOCK PED (Dressing) ×3 IMPLANT
DRESSING PETRO 3% BI 3BRM GZE XR 8X1IN (Dressing) ×2
DRESSING PETROLATUM XEROFORM L8 IN X W1 (Dressing) ×4
DRESSING PETROLATUM XEROFORM L8 IN X W1 IN 3% BISMUTH TRIBROMOPHENATE (Dressing) ×4 IMPLANT
DRESSING TRANSPARENT L2 3/4 IN X W2 3/8 (Dressing) ×6
DRESSING TRANSPARENT L2 3/4 IN X W2 3/8 IN POLYURETHANE ADHESIVE (Dressing) ×6 IMPLANT
DRESSING TRNS PU STD TGDRM 2.75INX2 3/8 (Dressing) ×3
ELECTRODE EEG NDL RCD SBDRL (Nervemon) ×3
ELECTRODE EEG NEEDLE SUBDURAL RECORD (Nervemon) ×2 IMPLANT
EYE PAD CURITY 1-5/8X2-5/8IN (Dressing) IMPLANT
GLOVE SRG PLISPRN 7 BGL PI ULTRATOUCH G (Glove) ×1
GLOVE SURGICAL 7 BIOGEL PI ULTRATOUCH G (Glove) ×2
GLOVE SURGICAL 7 BIOGEL PI ULTRATOUCH G POWDER FREE BEAD CUFF (Glove) ×2 IMPLANT
GOWN SRG FBRC XL STD ROYALSILK LF STRL (Gown) ×1
GOWN SURGICAL XL STANDARD FABRIC (Gown) ×2
GOWN SURGICAL XL STANDARD FABRIC ROYALSILK LEVEL 3 NONREINFORCE SET IN (Gown) ×2 IMPLANT
GOWN X-LRG POLY REINFORCED (Gown) ×3 IMPLANT
HRST DONUT HL-IN-ONE (Positioning Supplies) ×3 IMPLANT
IMPLANT COCHLEAR TITANIUM ABUTMENT BAHA OD4 MM B1300 (Ear) ×2 IMPLANT
IMPLANT COCLR TI BAHA 4MM ABTMNT B1300 (Ear) ×2 IMPLANT
KIT MAJOR EAR FFX (Kits) ×3 IMPLANT
KIT SCT LF STRL Y CNCT LVN IRR FLW REG C (Suction) ×1
KIT SUCTION Y CONNECTOR LEVINE (Suction) ×2
KIT SUCTION Y CONNECTOR LEVINE IRRIGATION FLOW REGULATOR C CLAMP (Suction) ×2 IMPLANT
MAGNET COCHLEAR CONE 4 SYSTEM BAHA BIM400 (Ear) ×2 IMPLANT
MAGNET COCLR CONE BAHA STRL 4 SYS DISP (Ear) ×2 IMPLANT
MARKER SKIN (Positioning Supplies) ×3 IMPLANT
MASTISOL VIAL 2/3CC STRL (Skin Closure) ×3 IMPLANT
MICROSCOPE DRAPE ZEISS (Drape) ×3 IMPLANT
PACKING NASAL L4 CM X W4 CM DRESSING (Packing) ×2
PACKING NASAL L4 CM X W4 CM DRESSING SINUS STENT HYDRATE MEROGEL HYAFF (Packing) ×2 IMPLANT
PACKING NSL MRGL HYAFF 4X4CM DRSG SIN (Packing) ×1
PAD ARMBOARD 20X8X2IN (Procedure Accessories) ×3 IMPLANT
PAD ELECTROSRG GRND REM W CRD (Procedure Accessories) ×3 IMPLANT
PAK EAR SCHINDLER W/STRING (Packing) ×3 IMPLANT
PENCIL ELECTRO PUSH BUTTON (Cautery) ×3 IMPLANT
SLEEVE SEQUEN COMP THIGH MED (Procedure Accessories) ×6 IMPLANT
SOL NACL .9% IRRIG 250ML NLTX (IV Solutions) ×2
SOLUTION IRR 0.9% NACL 1000ML LF STRL (Irrigation Solutions) ×1
SOLUTION IRR 0.9% NACL 3L ARTHMTC LF (Irrigation Solutions) ×2
SOLUTION IRRIGATION 0.9% SODIUM CHLORIDE (IV Solutions) ×4
SOLUTION IRRIGATION 0.9% SODIUM CHLORIDE (Irrigation Solutions) ×6 IMPLANT
SOLUTION IRRIGATION 0.9% SODIUM CHLORIDE 1000 ML PLASTIC POUR BOTTLE (Irrigation Solutions) ×2 IMPLANT
SOLUTION IRRIGATION 0.9% SODIUM CHLORIDE 250 ML PLASTIC POUR BOTTLE (IV Solutions) ×4 IMPLANT
SOLUTION PREP BETADINE 10% PVP IODINE 8OZ BOTTLE SKIN (Prep) ×2 IMPLANT
SOLUTION PRP 10% PVP IOD 8OZ BDINE BTL (Prep) ×3
SPONGE GAUZE L6 3/4 IN X W6 IN MEDIUM (Dressing) ×2
SPONGE GAUZE L6 3/4 IN X W6 IN MEDIUM ABSORBENT FLUFF DRY CRINKLE (Dressing) ×2 IMPLANT
SPONGE GZE CTTN MED KRLX 6.75X6IN LF (Dressing) ×1
SUTURE ABS CR 4-0 P-3 MTPS 18IN MFL BRN (Suture) ×1
SUTURE CHROMIC 5-0 P3 18IN (Suture) ×3 IMPLANT
SUTURE CHROMIC GUT CHROMIC 4-0 P-3 L18 (Suture) ×2 IMPLANT
SUTURE ETHILON 5-0 P3 18IN (Suture) ×3 IMPLANT
SUTURE PLN GUT 5.0 (Suture) ×3 IMPLANT
SUTURE SILK 2-0 SH 30IN (Suture) ×3 IMPLANT
SUTURE VICRYL 4-0 PS2 27IN (Suture) ×6 IMPLANT
SYRINGE 20 ML BD LUER-LOK MEDICAL (Syringes, Needles) ×2 IMPLANT
SYRINGE LUER LOCK 10CC (Syringes, Needles) ×6 IMPLANT
SYRINGE MED 1ML PRCSGLD 25GA 5/8IN LF (Needles) ×3 IMPLANT
SYRINGE MED 20ML LL LF STRL (Syringes, Needles) ×3
SYRINGE W/NDL L/L 3CC 23GX1.5 (Syringes, Needles) ×3 IMPLANT
TOWEL STERILE 6-PACK (Procedure Accessories) ×3 IMPLANT
TUBING CONNECTING STERILE 10FT (Tubing) ×1
TUBING SUCTION ID3/16 IN L10 FT (Tubing) ×2
TUBING SUCTION ID3/16 IN L10 FT NONCONDUCTIVE STRAIGHT MALE FEMALE (Tubing) ×2 IMPLANT
WATER STERILE PLASTIC POUR BOTTLE 250 ML (Irrigation Solutions) ×2 IMPLANT
WATER STRL 250ML LF PLS PR BTL (Irrigation Solutions) ×1

## 2013-07-05 NOTE — Anesthesia Preprocedure Evaluation (Signed)
Anesthesia Evaluation    AIRWAY    Mallampati: I    TM distance: >3 FB  Neck ROM: full  Mouth Opening:full   CARDIOVASCULAR    cardiovascular exam normal       DENTAL    No notable dental hx     PULMONARY    pulmonary exam normal     OTHER FINDINGS                      Anesthesia Plan    ASA 1     general                     intravenous induction   Detailed anesthesia plan: general endotracheal        Post op pain management: per surgeon    informed consent obtained

## 2013-07-05 NOTE — PACU (Signed)
Updated family again as pt. Does not yet want a visit. Husband declined to fill prescriptions despite full information given

## 2013-07-05 NOTE — Discharge Instructions (Addendum)
Remove outer bandage tomorrow (VORB).  Keep incisions dry until follow up appt in one week.  Apply bactracin to incisions BID, including ear canal closure incisions.  Take pain medicine as needed.  Take antibiotics BID for a week.    Post Anesthesia Discharge Instructions    Although you may be awake and alert in the recovery room, small amounts of anesthetic remain in your system for about 24 hours.  You may feel tired and sleepy during this time.      You are advised to go directly home from the hospital.    Plan to stay at home and rest for the remainder of the day.    It is advisable to have someone with you at home for 24 hours after surgery.    Do not operate a motor vehicle, or any mechanical or electrical equipment for the next 24 hours.      Be careful when you are walking around, you may become dizzy.  The effects of anesthesia and/or medications are still present and drowsiness may occur    Do not consume alcohol, tranquilizers, sleeping medications, or any other non prescribed medication for the remainder of the day.    Diet:  begin with liquids, progress your diet as tolerated or as directed by your surgeon.  Nausea and vomiting may occur in the next 24 hours.      1 Pain pill Oxycodone 5 mg given at 4:45 pm          Anesthesia: After Your Surgery    You've just had surgery. During surgery, you received medication called anesthesia to keep you comfortable and pain-free. After surgery, you may experience some pain or nausea. This is normal. Here are some tips for feeling better and recovering after surgery.     Stay on schedule with your medication.   Going Home  Your doctor or nurse will show you how to take care of yourself when you go home. He or she will also answer your questions. Have an adult family member or friend drive you home. For the first 24 hours after your surgery:   Do not drive or use heavy equipment.   Do not make important decisions or sign legal documents.   Avoid alcohol.   Have  someone stay with you, if needed. He or she can watch for problems and help keep you safe.  Be sure to keep all follow-up doctor's appointments. And rest after your procedure for as long as your doctor tells you to.  Coping with Pain  If you have pain after surgery, pain medication will help you feel better. Take it as directed, before pain becomes severe. Also, ask your doctor or pharmacist about other ways to control pain, such as with heat, ice, and relaxation. And follow any other instructions your surgeon or nurse gives you.  Tips for Taking Pain Medication  To get the best relief possible, remember these points:   Pain medications can upset your stomach. Taking them with a little food may help.   Most pain relievers taken by mouth need at least 20 to 30 minutes to take effect.   Taking medication on a schedule can help you remember to take it. Try to time your medication so that you can take it before beginning an activity, such as dressing, walking, or sitting down for dinner.   Constipation is a common side effect of pain medications. Contact your doctor before taking any medications like laxatives or stool softeners to  help relieve constipation. Also ask about any dietary restrictions, because drinkinglots of fluids andeating foodslikefruits and vegetables that are high in fiber can also help. Remember, don't take laxatives unless your surgeon has prescribed them.   Mixing alcohol and pain medication can cause dizziness and slow your breathing. It can even be fatal. Don't drink alcohol while taking pain medication.   Pain medication can slow your reflexes. Don't drive or operate machinery while taking pain medication.  If your health care provider advises you to take acetaminophen, the generic name for Tylenol and other brand-name pain relievers, to help relieve your pain, ask for a daily dose. Remember that acetaminophen or other pain relievers may interact with prescription medicines or other  over-the-counter (OTC) drugs. The FDA recommends reading OTC medication labels carefully to clearly understand the list of active ingredients, directions, and any precautions to help avoid taking too muchacetaminophen. If you have questions, ask your pharmacist or health care provider.  Managing Nausea  Some people have an upset stomach after surgery. This is often due to anesthesia, pain, pain medications, or the stress of surgery. The following tips will help you manage nausea and get good nutrition as you recover. If you were on a special diet before surgery, ask your doctor if you should follow it during recovery. These tips may help:   Don't push yourself to eat. Your body will tell you what to eat and when.   Start off with clear liquids and soup. They are easier to digest.   Progress to semisolids (mashed potatoes, applesauce, and gelatin) as you feel ready.   Slowly move to solid foods. Don't eat fatty, rich, or spicy foods at first.   Don't force yourself to have three large meals a day. Instead, eat smaller amounts more often.   Take pain medications with a small amount of solid food, such as crackers or toast to avoid nausea.  Call Your Surgeon If.   You still have pain an hour after taking medication (it may not be strong enough).   You feel too sleepy, dizzy, or groggy (medication may be too strong).   You have side effects like nausea, vomiting, or skin changes (rash, itching, or hives).    [address], [address] [place], [place] [address]. All rights reserved. This information is not intended as a substitute for professional medical care. Always follow your healthcare professional's instructions.

## 2013-07-05 NOTE — OR PreOp (Signed)
PATIENT GIVE UPDATE TO OR TIME DELAY

## 2013-07-05 NOTE — Op Note (Signed)
FULL OPERATIVE NOTE    Date Time: 07/05/2013 4:29 PM  Patient Name: Audrey Ray,Audrey Ray  Attending Physician: Judie Bonus, MD PHD      Date of Operation:   07/05/2013    Providers Performing:   Surgeon(s):  Judie Bonus, MD PHD    Maia Petties, RN - Circulator  Obie Dredge - Scrub Person  Judd Gaudier, RN - Relief Circulator  Judd Gaudier, RN - Relief Scrub  Ethlyn Gallery, RN - Relief Circulator  Vic Ripper, RN - Relief Circulator    Operative Procedure:   Procedure(s):  MASTOIDECTOMY, MODIFIED  MYRINGOPLASTY, FAT GRAFT  INSERTION, OSSEOINTEGRATED IMP/TEMPORAL BONE/BAHA    Preoperative Diagnosis:   Pre-Op Diagnosis Codes:     * Sensory hearing loss, 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 After a timeout was performed, general anesthesia was induced and the patient was intubated without difficulty.     A 2 cm incision was made in the right lower quadrant in the location of the previous scar. A golf-ball sized fat graft was harvested. Hemostasis was achieved. The incision was closed with 3-0 chromic and a 4-0 subcuticular prolene.     The table was turned 180 degrees and Lidocaine with epinephrine 1:100000 was injected in the retro-auricular area. The right ear was prepped and draped and the patient was positioned for otologic surgery.      A post-auricular incision was performed in the area of the previous scar. Hemostasis was achieved with Bovie cautery.   The periosteum was elevated posteriorly to expose the mastoid and anteriorly to the outer limits of the external auditory canal using the [facility]. Weitlaner retractors were placed to retract soft tissue.   Attention was then turned back to the external auditory canal. Using an 11 blade, the EAC was sectioned at the bony cartilaginous junction. The medial skin was  circumferentially elevated down to annulus. The middle ear was entered posteriorly. The medial canal wall skin was removed. The entire tympanic membrane was removed. The malleus and incus were removed.    At this point the lateral EAC skin was everted, approximated, an oversewn.   The eustachian tube was visualized and repacked with surgicel and bone wax. The middle ear and ear canal were then filled with fat  Attention was turned back to the mastoid area. The cavity was then filled with more fat.     The site of the implant was marked and the potential skin flap was marked. It was then injected with 4cc of 1% lidocaine with 1:100000 epinephrine.  Incisions were made as marked. A supraperiosteal flap was elevated posteriorly to expose the site of the potential implant site.  Bleeding was encountered posteriorly during development of this flap. Hemostasis was achieved with electrocautery and bone wax.  A cruciate incision was made through the periosteum at the site of the implant. Subperiosteal flaps were elevated.  The 3mm bit was used to drill a well perpendicularly into the bone. Speed was 2000 rev/s. No dura was encountered. The bit was extended to 4mm and the well was deepened. No dura was encountered. The widening 4mm bit was then used to widen the well. The bit was countersinked to reveal a small halo around the well. Finally using  a torque of 45, the 4mm implant was screwed until place. A small amount of bone was removed to accomodate the magnet. The magnet was then screwed into place at 25.     Closure of the postauricular incisions were performed in two layers. The deep layer involved the periosteum and muscle and was closed with interrupted 3-0 vicryl sutures. The superificial layer was closure of the epidermis with a running 4-0 prolene.  A mastoid dressing was placed. The patient was returned to the anesthesia team. General anesthesia was reversed, the LMA was removed. The patient was transferred to the  recovery room in stable condition.    Estimated Blood Loss:   75 mL    Implants:     Implant Name Type Inv. Item Serial No. Manufacturer Lot No. LRB No. Used Action   EAR COCHL BABH B1300  - [id number] Ear EAR COCHL BABH B1300   [employer] [id number] Right 1 Implanted   IMPLANT MAGNET [id number] - [id number] Ear IMPLANT MAGNET [id number]   [employer] [id number] N/A 1 Implanted       Drains:   Drains: no    Specimens:       Complications:   none      Signed by: Judie Bonus, MD

## 2013-07-05 NOTE — PACU (Signed)
Dr Juel Burrow to bedside to review pt. Pt make take dressing off tomorrow and apply ointment

## 2013-07-05 NOTE — H&P (Signed)
Pt examined within 24 hours of surgery. No changes in history or physical exam.

## 2013-07-05 NOTE — Anesthesia Postprocedure Evaluation (Signed)
Anesthesia Post Evaluation    Patient: Audrey Ray    Procedures performed: Procedure(s) with comments:  MASTOIDECTOMY, MODIFIED - RIGHT MIDDLE EAR OBLITERATION W/ OVERSEW OF EAR CANAL,  MYRINGOPLASTY, FAT GRAFT -  ABDOMINAL FAT GRAFT,   INSERTION, OSSEOINTEGRATED IMP/TEMPORAL BONE/BAHA - RIGHT BAHA    Anesthesia type: General LMA    Patient location:Phase I PACU    Last vitals:   Filed Vitals:    07/05/13 1630   BP: 143/79   Pulse: 70   Temp:    Resp:    SpO2: 99%       Post pain: Continue adjustment of pain medication;      Mental Status:awake    Respiratory Function: tolerating nasal cannula    Cardiovascular: stable    Nausea/Vomiting: patient not complaining of nausea or vomiting    Hydration Status: adequate    Post assessment: no apparent anesthetic complications

## 2013-07-05 NOTE — Transfer of Care (Signed)
Anesthesia Transfer of Care Note    Patient: Audrey Ray    Procedures performed: Procedure(s) with comments:  MASTOIDECTOMY, MODIFIED - RIGHT MIDDLE EAR OBLITERATION W/ OVERSEW OF EAR CANAL,  MYRINGOPLASTY, FAT GRAFT -  ABDOMINAL FAT GRAFT,   INSERTION, OSSEOINTEGRATED IMP/TEMPORAL BONE/BAHA - RIGHT BAHA    Anesthesia type: General LMA    Patient location:Phase I PACU    Last vitals:   Filed Vitals:    07/05/13 1624   BP: 117/59   Pulse: 102   Temp: 36.6 C (97.8 F)   Resp: 12   SpO2: 99%       Post pain: Patient not complaining of pain, continue current therapy      Mental Status:lethargic    Respiratory Function: tolerating face mask    Cardiovascular: stable    Nausea/Vomiting: patient not complaining of nausea or vomiting    Hydration Status: adequate    Post assessment: no apparent anesthetic complications, no reportable events and no evidence of recall

## 2013-07-05 NOTE — PACU (Signed)
Reviewed by Dr. Effie Shy, pt remains drowsy, but responsive.

## 2013-07-06 ENCOUNTER — Encounter: Payer: Self-pay | Admitting: Otolaryngology

## 2013-08-10 ENCOUNTER — Ambulatory Visit
Admission: RE | Admit: 2013-08-10 | Discharge: 2013-08-10 | Disposition: A | Discharge status: Home / Self Care | Payer: Enrolled Prime—HMO | Source: Ambulatory Visit | Attending: Otolaryngology | Admitting: Otolaryngology

## 2013-08-10 DIAGNOSIS — D333 Benign neoplasm of cranial nerves: Secondary | ICD-10-CM | POA: Insufficient documentation

## 2013-08-10 NOTE — PT Eval Note (Signed)
[facility]   [address]  [place], [place] [address]  Tel [phone]  Fax [phone]    PHYSICAL THERAPY EVALUATION      PATIENT: Audrey Ray   Referred By: Judie Bonus, MD PHD  DOB: 1950-06-30    Medical Record #: [id number]  AGE: 63 y.o.     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  Patient's medical condition is appropriate for Physical therapy intervention at this time.    Precautions and Contraindications: falls risk, h/o falls    History of Present Illness: Audrey Ray is a 63 y.o. female presents to outpatient therapy with s/p R acoustic neuroma removal 04/14/13 followed by middle ear obliteration. 07/05/13 pt underwent mastoidectomy / R ear obliteration with oversew of ear canal, myringoplasty, pt with complication of cerebral fluid leak.     Past Medical/Surgical History:  Past Medical History   Diagnosis Date   . Migraine    . Stomach ulcer    . Headache(784.0) 1990     Left side migraines a controlled by pamalor   . Chronic ulcer of unspecified site 2009     Resolved   . Hearing loss NEC 2015     Results of acoustic neuroma surgery   . Depression 1990   . Post-operative nausea and vomiting      Has been controlled following during recent procedures      Past Surgical History   Procedure Laterality Date   . Cesarean section     . Hysterectomy  1996   . Menicus  1999     left knee   . Craniotomy, trans-petrosal approach  04/14/2013     Procedure: Marcy Panning APPROACH;  Surgeon: Ericka Pontiff, MD;  Location: [facility];  Service: Neurosurgery;  Laterality: Right;  RIGHT TRANSLABYRINTHINE APPROACH FOR VESTIBULAR SCHWANNOMA, ABDOMINAL FAT GRAFT   . Mastoidectomy, modified  04/14/2013     Procedure: MASTOIDECTOMY, MODIFIED;  Surgeon:  Judie Bonus, MD PHD;  Location: [facility];  Service: ENT;  Laterality: N/A;   . Tympanoplasty, exploration , removal cholesteatoma  04/19/2013     Procedure: TYMPANOPLASTY, EXPLORATION , REMOVAL CHOLESTEATOMA;  Surgeon: Judie Bonus, MD PHD;  Location: [facility];  Service: ENT;  Laterality: Right;  Middle Ear Obliteration with fat graft harvest from abdomen; Control of CSF leak   . Tonsillectomy  1960   . Adenoidectomy  1960   . Mastoidectomy, modified  07/05/2013     Procedure: MASTOIDECTOMY, MODIFIED;  Surgeon: Judie Bonus, MD PHD;  Location: [facility];  Service: ENT;  Laterality: N/A;  RIGHT MIDDLE EAR OBLITERATION W/ OVERSEW OF EAR CANAL,   . Myringoplasty, fat graft  07/05/2013     Procedure: MYRINGOPLASTY, FAT GRAFT;  Surgeon: Judie Bonus, MD PHD;  Location: [facility];  Service: ENT;  Laterality: Right;   ABDOMINAL FAT GRAFT,    . Insertion, osseointegrated imp/temporal bone/baha  07/05/2013     Procedure: INSERTION, OSSEOINTEGRATED IMP/TEMPORAL BONE/BAHA;  Surgeon: Judie Bonus, MD PHD;  Location: [facility];  Service: ENT;  Laterality: N/A;  RIGHT BAHA         Social History:  Lives with  husband, retired, in [facility] split level, always holds on to rail on stairs.     Patient Goal: I want to walk marathon in November in [place].    Subjective: Patient is agreeable to participation in the therapy session. Pt with dizziness with sudden head movements. No issues with driving but pt has sensors on both sides of the car. Pt reports she has difficulty with walking at night, in the dark, pt uses walking stick for balance with walking, however she reports she is able to walk slowly w/o LOB but it takes a lot of concentration. Pt leaving for [place] for two weeks.                  Objective:                           Oculomotor:  Nystagmus in room light?: none  Smooth Pursuit/ROM: WNL  Horizontal/Vertical Saccades: WNL  Vergence: 6" with R eye not converging beyond this distance.   VOR: Pt with  difficulty maintaining R eye target.  DVA: 8 line loss  VOR Cancellation: WNL  Acuity: 20/25 L eye, 20/40 R eye  Head Thrust: no issues noted    Frenzel Exam:  Nystagmus: none noted  Head Shake Test: L beating nastagmus  Pressure Test: NT  Hyperventilation: NT    VBI Testing=negative    Positional Testing: NT    Functional Mobility:     Transfers: mod I  Bed Mobility: mod I  Ambulation: mod I, able to ambulate indoor level surfaces w/o AD, LOB with head turning and with cognitive / visual distractions.   10 meter walk= 10.53 sec w/o AD.  Times greater than 8 seconds indicate decreased safety with community mobility.    Functional Gait Assessment  Gait level surface 2   Change in gait speed 2   Gait with horizontal head turns 0   Gait with vertical head turns 0   Gait and pivot turns 2   Step over obstacle 3   Gait with narrow base of support 0   Gait with eyes closed 0   Ambulating backwards 2   Steps 2   Total Score 13   Scores < 23/30 indicate increased risk for falls.    Balance:     Timed Get Up and Go  Score = 6.53 sec  Scores > 13.5 seconds indicate increased risk for falls.      mCTSIB Trial 1 Trail 2 Trial 3   Eyes Open on Floor  30 sec  sec  sec   Eyes Closed on Floor  30 sec mod / max sway  sec  sec   Eyes Open on Foam  30 sec  sec  sec   Eyes Closed on Foam  4 sec  5 sec  4 sec   At least 30 Seconds is WNL  Only one trial performed if first trial is WNL  Dizziness Handicap Inventory [facility])    Instructions: The purpose of this test is to identify difficulties that you may be experiencing because of your dizziness or unsteadiness.  Please answer "yes", "sometimes", or "no" to each question.  Answer each question as it pertains to your dizziness problems only.     Yes (4) Sometimes (2) No (0)   P1. Does looking up increase your problem?  2    E2. Because of your problem do you feel frustrated? 4     F3. Because of  your problem do you restrict your travel for business or         recreation?   0   P4. Does  walking down the aisle of a grocery store increase your problem?   0   F5. Because of your problem, do you have trouble getting out of bed?   0   F6. Does your problem significantly restrict your participation in social         activities such as going out to dinner, to the movies, dancing or to parties?   0   F7. Because of your problem do you have difficulty reading? 4     P8. Does performing more ambitious activities like sports, dancing, household         chores (sweeping, putting dishes away), increase your problem?  2    E9. Because of your problem are you afraid to leave your home without having someone accompany you?   0   E10. Because of your problem have you been embarrassed in front of others?   0   P11. Do quick movements of your head increase you problem?  2    F12. Because of your problem do you avoid heights?   0   P13. Does turning over in bed increase your problem?   0   F14. Because of your problem is it difficult for you to strenuous housework or           yardwork?    2    E15. Because of your problem are you afraid people may think you are           intoxicated?  2    P16. Because of your problem is it difficult for you to go on a walk by           yourself?  2    P17. Does walking down a sidewalk increase your problem?  2    E18. Because of your problem is it difficult for you to concentrate?   0   F19. Because of your problem is it difficult for you to walk around your house           in the dark? 4     E20. Because of your problem are you afraid to stay home alone?   0   E21. Because of your problem do you feel handicapped?  2    E22. Has your problem placed stress on your relationships with members of your family or friends?   0   E23. Because of your problem are you depressed?   0   F24. Does your problem interfere with your job or household responsibilities?   0   P25. Does bending over increase your problem?  2        F= ___10_____  E= __8________  P= ___12_______    (F+E+P)/100 =  ______30____ Total Score    16-34 Points (mild handicap)    36-52 Points (moderate handicap)    54+ Points (severe handicap)        Participation and Activity Tolerance good    Treatment Activities: Educated the patient to role of physical therapy, risks and benefits, plan of care, goals of therapy and Home Exercise Program to include: pencil push ups, shifting focus / five fingers, perfect protocol, walking in grocery store with head rotations / horizontal / vertical, standing with EC feet together, on blanket with EC feet apart. Education on convergence issues , vestibular issues, vestibular rehabilitation  and how it works.     Neuromuscular Rehab X 25 minutes    Assessment: Cicily Bonano is a 63 y.o. female presenting to outpatient therapy with   decreased static/dynamic balance, motion sensitivity, oculomotor deficits and BPPV resulting in decreased safety and independence with functional mobility.  Patient requires skilled Physical Therapy intervention to address the above deficits to maximize safety with mobility and return to prior level of function.    Rehabilitation Potential: good      Plan:  Therapeutic Activities, Gait training, Retail banker, Therapeutic Exercise, Location manager, Neuromuscular Re-education, Building services engineer, Canalith Repositioning Maneuver, Home Exercise Program, Manual Therapy.    Frequency of treatment:  2 times per week for 12 weeks.    Short Term Goals:  6 weeks  1) Independent with initial home exercise program.  2) Increase Functional Gait Assessment Score to  20/30 to decrease risk of falls with ambulation.  3) Patient will have negative positional tests with no nystagmus noted for increased safety with ADLs/mobility.  4) Ambulate indoor surfaces with  full foot clearance, consistent foot clearance, to decrease risk of falls.    Long Term Goals: 12 weeks   1) Increase Functional Gait Assessment Score to 25 /30 to decrease risk of falls with  ambulation.  2) Ambulate indoor/outdoor surfaces without and with cognitive challenge, visual distractions, head turning, sudden stops to decrease risk of falls with ambulation.  3) Patient will have no complaints of symptoms with bending, turning in bed, head turns, stair negotiation, busy environments.  4) Patient will score WNL on DVA test to allow for gaze stability.      Therapist's Signature:    Osie Cheeks, DPT  UJ#[id number]           Physician's Signature:      Date:

## 2013-08-13 ENCOUNTER — Ambulatory Visit: Payer: Enrolled Prime—HMO

## 2013-08-17 ENCOUNTER — Ambulatory Visit: Payer: Enrolled Prime—HMO

## 2013-08-17 LAB — ECG 12-LEAD
Atrial Rate: 81 {beats}/min
P Axis: 77 degrees
P-R Interval: 150 ms
Q-T Interval: 400 ms
QRS Duration: 90 ms
QTC Calculation (Bezet): 464 ms
R Axis: 37 degrees
T Axis: 66 degrees
Ventricular Rate: 81 {beats}/min

## 2013-09-06 ENCOUNTER — Ambulatory Visit: Payer: Enrolled Prime—HMO

## 2014-10-24 DIAGNOSIS — M1990 Unspecified osteoarthritis, unspecified site: Secondary | ICD-10-CM

## 2014-10-24 HISTORY — DX: Unspecified osteoarthritis, unspecified site: M19.90

## 2014-10-29 ENCOUNTER — Emergency Department: Payer: Medicare Other

## 2014-10-29 ENCOUNTER — Emergency Department
Admission: EM | Admit: 2014-10-29 | Discharge: 2014-10-29 | Disposition: A | Discharge status: Home / Self Care | Payer: Medicare Other | Attending: Emergency Medical Services | Admitting: Emergency Medical Services

## 2014-10-29 DIAGNOSIS — K277 Chronic peptic ulcer, site unspecified, without hemorrhage or perforation: Secondary | ICD-10-CM | POA: Insufficient documentation

## 2014-10-29 DIAGNOSIS — K2970 Gastritis, unspecified, without bleeding: Secondary | ICD-10-CM | POA: Insufficient documentation

## 2014-10-29 LAB — CBC AND DIFFERENTIAL
Basophils Absolute Automated: 0.09 10*3/uL (ref 0.00–0.20)
Basophils Automated: 2 %
Eosinophils Absolute Automated: 0.04 10*3/uL (ref 0.00–0.70)
Eosinophils Automated: 1 %
Hematocrit: 45 % (ref 37.0–47.0)
Hgb: 15.5 g/dL (ref 12.0–16.0)
Immature Granulocytes Absolute: 0.01 10*3/uL
Immature Granulocytes: 0 %
Lymphocytes Absolute Automated: 1.6 10*3/uL (ref 0.50–4.40)
Lymphocytes Automated: 27 %
MCH: 30.9 pg (ref 28.0–32.0)
MCHC: 34.4 g/dL (ref 32.0–36.0)
MCV: 89.6 fL (ref 80.0–100.0)
MPV: 9.1 fL — ABNORMAL LOW (ref 9.4–12.3)
Monocytes Absolute Automated: 0.5 10*3/uL (ref 0.00–1.20)
Monocytes: 8 %
Neutrophils Absolute: 3.66 10*3/uL (ref 1.80–8.10)
Neutrophils: 62 %
Nucleated RBC: 0 /100 WBC (ref 0–1)
Platelets: 283 10*3/uL (ref 140–400)
RBC: 5.02 10*6/uL (ref 4.20–5.40)
RDW: 13 % (ref 12–15)
WBC: 5.89 10*3/uL (ref 3.50–10.80)

## 2014-10-29 LAB — BASIC METABOLIC PANEL
Anion Gap: 10 (ref 5.0–15.0)
BUN: 17 mg/dL (ref 7–19)
CO2: 29 mEq/L (ref 22–29)
Calcium: 10.2 mg/dL (ref 8.5–10.5)
Chloride: 104 mEq/L (ref 100–111)
Creatinine: 0.9 mg/dL (ref 0.6–1.0)
Glucose: 89 mg/dL (ref 70–100)
Potassium: 4.6 mEq/L (ref 3.5–5.1)
Sodium: 143 mEq/L (ref 136–145)

## 2014-10-29 LAB — HEPATIC FUNCTION PANEL
ALT: 27 U/L (ref 0–55)
AST (SGOT): 39 U/L — ABNORMAL HIGH (ref 5–34)
Albumin/Globulin Ratio: 1.6 (ref 0.9–2.2)
Albumin: 4.1 g/dL (ref 3.5–5.0)
Alkaline Phosphatase: 45 U/L (ref 37–106)
Bilirubin Direct: 0.3 mg/dL (ref 0.0–0.5)
Bilirubin Indirect: 0.4 mg/dL (ref 0.0–1.1)
Bilirubin, Total: 0.7 mg/dL (ref 0.2–1.2)
Globulin: 2.6 g/dL (ref 2.0–3.6)
Protein, Total: 6.7 g/dL (ref 6.0–8.3)

## 2014-10-29 LAB — LIPASE: Lipase: 82 U/L — ABNORMAL HIGH (ref 8–78)

## 2014-10-29 LAB — GFR: EGFR: >60

## 2014-10-29 LAB — IHS D-DIMER: D-Dimer: <0.27 ug/mL FEU (ref 0.00–0.51)

## 2014-10-29 MED ORDER — SODIUM CHLORIDE 0.9 % IV BOLUS
1000.0000 mL | Freq: Once | INTRAVENOUS | Status: AC
Start: 2014-10-29 — End: 2014-10-29
  Administered 2014-10-29: 1000 mL via INTRAVENOUS

## 2014-10-29 MED ORDER — PANTOPRAZOLE SODIUM 40 MG PO TBEC
40.0000 mg | DELAYED_RELEASE_TABLET | Freq: Every day | ORAL | Status: AC
Start: 2014-10-29 — End: 2015-10-29

## 2014-10-29 MED ORDER — PANTOPRAZOLE SODIUM 40 MG IV SOLR
40.0000 mg | Freq: Once | INTRAVENOUS | Status: AC
Start: 2014-10-29 — End: 2014-10-29
  Administered 2014-10-29: 40 mg via INTRAVENOUS
  Filled 2014-10-29: qty 40

## 2014-10-29 MED ORDER — SUCRALFATE 1 G PO TABS
1.0000 g | ORAL_TABLET | Freq: Four times a day (QID) | ORAL | Status: DC
Start: 2014-10-29 — End: 2016-07-22

## 2014-10-29 NOTE — ED Provider Notes (Signed)
Physician/Midlevel provider first contact with patient: 10/29/14 1610         [facility] EMERGENCY DEPARTMENT HISTORY AND PHYSICAL EXAM    Patient Name: Audrey Ray, Audrey Ray  Encounter Date:  10/29/2014  Rendering Provider: Coral Else , MD  Patient DOB:  Feb 12, 1951  MRN:  [id number]    History of Presenting Illness     Historian: Pt    64 y.o. female with h/o stomach ulcer, chronic ulcer and hysterectomy p/w several weeks ago onset of intermittent epigastric pain which worsened last night. She notes her pain has been constant since last night and does not improve after eating. Associated with nausea and R calf pain. She reports this pain feels similar to her previous peptic ulcer. She has tried taking Nexium with minimal relief. She notes she has been taking Aleve for Ray knee injury over the last 3 weeks. No CP, SOB, back pain.          PMD:  Pcp, [facility], MD    Past Medical History     Past Medical History   Diagnosis Date   . Migraine    . Stomach ulcer    . Headache(784.0) 1990     Left side migraines Ray controlled by pamalor   . Chronic ulcer of unspecified site 2009     Resolved   . Hearing loss NEC 2015     Results of acoustic neuroma surgery   . Depression 1990   . Post-operative nausea and vomiting      Has been controlled following during recent procedures       Past Surgical History     Past Surgical History   Procedure Laterality Date   . Cesarean section     . Hysterectomy  1996   . Menicus  1999     left knee   . Craniotomy, trans-petrosal approach  04/14/2013     Procedure: Marcy Panning APPROACH;  Surgeon: Ericka Pontiff, MD;  Location: [facility];  Service: Neurosurgery;  Laterality: Right;  RIGHT TRANSLABYRINTHINE APPROACH FOR VESTIBULAR SCHWANNOMA, ABDOMINAL FAT GRAFT   . Mastoidectomy, modified  04/14/2013     Procedure: MASTOIDECTOMY, MODIFIED;  Surgeon: Judie Bonus, MD PHD;  Location: [facility];  Service: ENT;  Laterality: N/Ray;   . Tympanoplasty, exploration ,  removal cholesteatoma  04/19/2013     Procedure: TYMPANOPLASTY, EXPLORATION , REMOVAL CHOLESTEATOMA;  Surgeon: Judie Bonus, MD PHD;  Location: [facility];  Service: ENT;  Laterality: Right;  Middle Ear Obliteration with fat graft harvest from abdomen; Control of CSF leak   . Tonsillectomy  1960   . Adenoidectomy  1960   . Mastoidectomy, modified  07/05/2013     Procedure: MASTOIDECTOMY, MODIFIED;  Surgeon: Judie Bonus, MD PHD;  Location: [facility];  Service: ENT;  Laterality: N/Ray;  RIGHT MIDDLE EAR OBLITERATION W/ OVERSEW OF EAR CANAL,   . Myringoplasty, fat graft  07/05/2013     Procedure: MYRINGOPLASTY, FAT GRAFT;  Surgeon: Judie Bonus, MD PHD;  Location: [facility];  Service: ENT;  Laterality: Right;   ABDOMINAL FAT GRAFT,    . Insertion, osseointegrated imp/temporal bone/baha  07/05/2013     Procedure: INSERTION, OSSEOINTEGRATED IMP/TEMPORAL BONE/BAHA;  Surgeon: Judie Bonus, MD PHD;  Location: [facility];  Service: ENT;  Laterality: N/Ray;  RIGHT BAHA       Family History     No family history on file.    Social History  Social History     Social History   . Marital Status: Married     Spouse Name: N/Ray   . Number of Children: N/Ray   . Years of Education: N/Ray     Social History Main Topics   . Smoking status: Never Smoker    . Smokeless tobacco: Not on file   . Alcohol Use: Yes     5 Glasses of wine per week   . Drug Use: No   . Sexual Activity:     Partners: Male     Birth Control/ Protection: Post-menopausal     Other Topics Concern   . Not on file     Social History Narrative       Home Medications     Home medications reviewed by ED MD     Discharge Medication List as of 10/29/2014 11:20 AM      CONTINUE these medications which have NOT CHANGED    Details   calcium-vitamin D (OSCAL) 250-125 MG-UNIT per tablet Take 1 tablet by mouth daily., Until Discontinued, Historical Med      Cetirizine HCl (ZYRTEC ALLERGY) 10 MG Cap Take by mouth daily., Until Discontinued, Historical Med      esomeprazole  (NEXIUM) 40 MG capsule Take 40 mg by mouth every morning before breakfast., Until Discontinued, Historical Med      Multiple Vitamin (MULTIVITAMIN) capsule Take 1 capsule by mouth daily., Until Discontinued, Historical Med      nortriptyline (PAMELOR) 75 MG capsule Take 75 mg by mouth nightly. , Until Discontinued, Historical Med      oxyCODONE-acetaminophen (PERCOCET) 5-325 MG per tablet 1-2 tablets PO Q4 hours prn pain, Print      predniSONE (DELTASONE) 20 MG tablet Please take 2 tabs (40mg  total) prn pain.  May take on 1/29 and 1/30, Print             Review of Systems     CV:  No CP   Resp:  No SOB   GI: +Epigastric abd pain, +Nausea  MS:  +R calf pain, No back pain   All other systems reviewed and negative    Physical Exam     BP 117/71 mmHg  Pulse 81  Temp(Src) 98.2 F (36.8 C)  Resp 18  Ht 5\' 7"  (1.702 m)  Wt 62.596 kg  BMI 21.61 kg/m2  SpO2 96%    CONSTITUTIONAL  Patient is afebrile, Vital signs reviewed.  HEAD  Atraumatic, Normocephallc.  EYES   Eyes are normal to inspection, PERRL, No discharge from eyes,  ENT  Ears normal to inspection, Nose examination normal, Posterior pharynx normal.  NECK   Normal ROM, No jugular venous distention, No meningeal signs.  RESPIRATORY CHEST   Chest is nontender, Breath sounds normal.  CARDIOVASCULAR   RRR, Heart sounds normal, Normal S1 S2.  ABDOMEN  Epigastric tenderness, No pulsatile masses, No other masses,  Bowel sounds normal, No distension, No peritoneal signs.  BACK  There is no CVA Tenderness, There is no tenderness to palpation.   UPPER EXTREMITY  Inspection normal, No cyanosis.   LOWER EXTREMITY  Inspection normal, No cyanosis.   NEURO  GCS Is 15, No focal motor deficits, No focal sensory deficits  SKIN   Skin is warm, Skin is dry, Skin is normal color  LYMPHATIC   No adenopathy in neck.  PSYCHIATRIC Oriented X 3, Normal affect. Normal insight.    ED Medications Administered     ED Medication Orders  Start Ordered     Status Ordering Provider     10/29/14 1027 10/29/14 1026  pantoprazole (PROTONIX) injection 40 mg   Once     Route: Intravenous  Ordered Dose: 40 mg     Last MAR action:  Given Audrey Ray    10/29/14 0950 10/29/14 0949  sodium chloride 0.9 % bolus 1,000 mL   Once     Route: Intravenous  Ordered Dose: 1,000 mL     Last MAR action:  New Bag Audrey Ray          Orders Placed During This Encounter     Orders Placed This Encounter   Procedures   . US Abdomen Limited RUQ   . Basic Metabolic Panel   . CBC and differential   . Lipase   . Hepatic function panel (LFT)   . UA, Reflex to Microscopic (pts  3 + yrs)   . GFR   . D-Dimer   . Diet NPO effective now   . ECG 12 Lead   . Saline lock IV       Diagnostic Study Results     The results of the diagnostic studies below were reviewed by the ED provider:    Labs  Results     Procedure Component Value Units Date/Time    D-Dimer [[id number]] Collected:  10/29/14 1007     D-Dimer <0.27 ug/mL FEU Updated:  10/29/14 1043    Basic Metabolic Panel [[id number]] Collected:  10/29/14 1007    Specimen Information:  Blood Updated:  10/29/14 1042     Glucose 89 mg/dL      BUN 17 mg/dL      Creatinine 0.9 mg/dL      Calcium 78.2 mg/dL      Sodium 956 mEq/L      Potassium 4.6 mEq/L      Chloride 104 mEq/L      CO2 29 mEq/L      Anion Gap 10.0     Lipase [[id number]]  (Abnormal) Collected:  10/29/14 1007    Specimen Information:  Blood Updated:  10/29/14 1042     Lipase 82 (H) U/L     Hepatic function panel (LFT) [[id number]]  (Abnormal) Collected:  10/29/14 1007    Specimen Information:  Blood Updated:  10/29/14 1042     Bilirubin, Total 0.7 mg/dL      Bilirubin, Direct 0.3 mg/dL      Bilirubin, Indirect 0.4 mg/dL      AST (SGOT) 39 (H) U/L      ALT 27 U/L      Alkaline Phosphatase 45 U/L      Protein, Total 6.7 g/dL      Albumin 4.1 g/dL      Globulin 2.6 g/dL      Albumin/Globulin Ratio 1.6     GFR [[id number]] Collected:  10/29/14 1007     EGFR >60.0 Updated:  10/29/14 1042    CBC and differential [[id number]]   (Abnormal) Collected:  10/29/14 1007    Specimen Information:  Blood from Blood Updated:  10/29/14 1022     WBC 5.89 x10 3/uL      Hgb 15.5 g/dL      Hematocrit 03.4 %      Platelets 283 x10 3/uL      RBC 5.02 x10 6/uL      MCV 89.6 fL      MCH 30.9 pg      MCHC 34.4 g/dL  RDW 13 %      MPV 9.1 (L) fL      Neutrophils 62 %      Lymphocytes Automated 27 %      Monocytes 8 %      Eosinophils Automated 1 %      Basophils Automated 2 %      Immature Granulocyte 0 %      Nucleated RBC 0 /100 WBC      Neutrophils Absolute 3.66 x10 3/uL      Abs Lymph Automated 1.60 x10 3/uL      Abs Mono Automated 0.50 x10 3/uL      Abs Eos Automated 0.04 x10 3/uL      Absolute Baso Automated 0.09 x10 3/uL      Absolute Immature Granulocyte 0.01 x10 3/uL           Radiologic Studies  Radiology Results (24 Hour)     Procedure Component Value Units Date/Time    US Abdomen Limited RUQ [[id number]] Collected:  10/29/14 1104    Order Status:  Completed Updated:  10/29/14 1109    Narrative:      History:  64 year old female with right upper quadrant pain.    FINDINGS:  Sonographic examination of the right upper quadrant demonstrates Ray  normal gallbladder with no evidence of stones, significant wall  thickening, pericholecystic fluid, or direct gallbladder tenderness. The  visualized bile ducts are nondilated. Common bile duct measures 3 mm.    Hepatic size, configuration and parenchymal echogenicity are normal. No  hepatic lesions are identified. The visualized portions of the pancreas  appear normal.    No aneurysmal dilation of the aorta is demonstrated. The visualized  portions of the IVC are unremarkable.  There is no ascites. Limited  evaluation of the right kidney demonstrates normal size and no  significant collecting system dilatation.      Impression:        1. No evidence for gallstones.  2. No detectable abnormalities.        Wilmon Pali, MD   10/29/2014 11:04 AM            Scribe and MD Attestations     I, Coral Else, MD,  personally performed the services documented. Meghan Paticia Stack is scribing for me on Harkin,Audrey Ray. I reviewed and confirm the accuracy of the information in this medical record.    I, Arty Baumgartner, am serving as Ray Neurosurgeon to document services personally performed by Coral Else, MD, based on the provider's statements to me.     Rendering Provider: Coral Else, MD    Monitors, EKG, Critical Care, and Splints     EKG (interpreted by ED physician): NSR, Rate at 72 bpm, Normal axis and intervals, No acute St changes  Cardiac Monitor (interpreted by ED physician): N/Ray     Critical Care:   Splint check:      MDM and Clinical Notes     Notes:    11:20 am Pt is feeling better and her epigastric pain has lessened      Consults:    Diagnosis and Disposition     Clinical Impression  1. Gastritis        Disposition  ED Disposition     Discharge Audrey Ray discharge to home/self care.    Condition at disposition: Stable            Prescriptions       Discharge Medication List as of 10/29/2014 11:20  AM      START taking these medications    Details   pantoprazole (PROTONIX) 40 MG tablet Take 1 tablet (40 mg total) by mouth daily., Starting 10/29/2014, Until Sun 10/29/15, Print      sucralfate (CARAFATE) 1 G tablet Take 1 tablet (1 g total) by mouth 4 (four) times daily., Starting 10/29/2014, Until Discontinued, Print                   Coral Else, MD  11/01/14 [phone]

## 2014-10-29 NOTE — Discharge Instructions (Signed)
Dear  Audrey Ray:    I appreciate your choosing the [facility] for your healthcare needs, and hope your visit today was EXCELLENT.    Instructions:  Please follow-up with your primary care doctor and GI doctor next week.     Stop taking Nexium for the next 10 days while using the Protonix.     Return to the Emergency Department for any worsening symptoms or concerns.    Below is some information that our patients often find helpful.    We wish you good health and please do not hesitate to contact us if we can ever be of any assistance.    Sincerely,  Coral Else, MD  [facility]    ________________________________________________________________    If you do not continue to improve or your condition worsens, please contact your doctor or return immediately to the Emergency Department.    Thank you for choosing [facility] for your emergency care needs.  We strive to provide EXCELLENT care to you and your family.      DOCTOR REFERRALS  Call [phone] if you need any further referrals and we can help you find a primary care doctor or specialist.  Also, available online at:  [email or web address]    YOUR CONTACT INFORMATION  Before leaving please check with registration to make sure we have an up-to-date contact number.  You can call registration at [phone] to update your information.  For questions about your hospital bill, please call [phone].  For questions about your Emergency Dept Physician bill please call [phone].      FREE HEALTH SERVICES  If you need help with health or social services, please call 2-1-1 for a free referral to resources in your area.  2-1-1 is a free service connecting people with information on health insurance, free clinics, pregnancy, mental health, dental care, food assistance, housing, and substance abuse counseling.  Also, available online at:   http://www.[facility].org    MEDICAL RECORDS AND TESTS  Certain laboratory test results do not come back the same day, for example urine cultures.   We will contact you if other important findings are noted.  Radiology films are often reviewed again to ensure accuracy.  If there is any discrepancy, we will notify you.      Please call [phone] to pick up a complimentary CD of any radiology studies performed.  If you or your doctor would like to request a copy of your medical records, please call [phone].      ORTHOPEDIC INJURY   Please know that significant injuries can exist even when an initial x-ray is read as normal or negative.  This can occur because some fractures (broken bones) are not initially visible on x-rays.  For this reason, close outpatient follow-up with your primary care doctor or bone specialist (orthopedist) is required.    MEDICATIONS AND FOLLOWUP  Please be aware that some prescription medications can cause drowsiness.  Use caution when driving or operating machinery.    The examination and treatment you have received in our Emergency Department is provided on an emergency basis, and is not intended to be a substitute for your primary care physician.  It is important that your doctor checks you again and that you report any new or remaining problems at that time.      24 HOUR PHARMACIES  [facility] - [address], [place], [place] [address] (1.4 miles,  7 minutes)  [facility] - [address] [place], [place] [address] (6.5 miles, 13 minutes)  Handout with directions available on request

## 2014-10-29 NOTE — ED Notes (Addendum)
abd pain central no vomiting or diarrhea.  Onset several weeks but progressively worse.  Also c/o headache.  3 weeks ago twisted knee in [place] and has been taking aleve. Pt states feels like prior peptic ulcer

## 2014-10-31 LAB — ECG 12-LEAD
Atrial Rate: 72 {beats}/min
P Axis: 85 degrees
P-R Interval: 162 ms
Q-T Interval: 372 ms
QRS Duration: 76 ms
QTC Calculation (Bezet): 407 ms
R Axis: 86 degrees
T Axis: 79 degrees
Ventricular Rate: 72 {beats}/min

## 2014-11-10 ENCOUNTER — Ambulatory Visit: Payer: Medicare Other | Attending: Sports Medicine

## 2014-11-10 NOTE — Pre-Procedure Instructions (Signed)
Faxed orders to Pharmacy.

## 2014-11-24 NOTE — Pre-Procedure Instructions (Signed)
Called surgeon for PT/INR and CXR.

## 2014-11-29 ENCOUNTER — Ambulatory Visit: Payer: Medicare Other | Admitting: Pain Medicine

## 2014-11-29 ENCOUNTER — Other Ambulatory Visit: Payer: Self-pay

## 2014-11-29 ENCOUNTER — Ambulatory Visit: Payer: Medicare Other | Admitting: Sports Medicine

## 2014-11-29 ENCOUNTER — Ambulatory Visit
Admission: RE | Admit: 2014-11-29 | Discharge: 2014-11-29 | Disposition: A | Discharge status: Home / Self Care | Payer: Medicare Other | Source: Ambulatory Visit | Attending: Sports Medicine | Admitting: Sports Medicine

## 2014-11-29 ENCOUNTER — Encounter
Admission: RE | Disposition: A | Discharge status: Home / Self Care | Payer: Self-pay | Source: Ambulatory Visit | Attending: Sports Medicine

## 2014-11-29 ENCOUNTER — Ambulatory Visit: Payer: Medicare Other

## 2014-11-29 DIAGNOSIS — K219 Gastro-esophageal reflux disease without esophagitis: Secondary | ICD-10-CM | POA: Insufficient documentation

## 2014-11-29 DIAGNOSIS — M84361A Stress fracture, right tibia, initial encounter for fracture: Secondary | ICD-10-CM | POA: Insufficient documentation

## 2014-11-29 DIAGNOSIS — Z419 Encounter for procedure for purposes other than remedying health state, unspecified: Secondary | ICD-10-CM

## 2014-11-29 DIAGNOSIS — S83241A Other tear of medial meniscus, current injury, right knee, initial encounter: Secondary | ICD-10-CM | POA: Insufficient documentation

## 2014-11-29 DIAGNOSIS — M659 Synovitis and tenosynovitis, unspecified: Secondary | ICD-10-CM | POA: Insufficient documentation

## 2014-11-29 DIAGNOSIS — X58XXXA Exposure to other specified factors, initial encounter: Secondary | ICD-10-CM | POA: Insufficient documentation

## 2014-11-29 DIAGNOSIS — M84351A Stress fracture, right femur, initial encounter for fracture: Secondary | ICD-10-CM | POA: Insufficient documentation

## 2014-11-29 DIAGNOSIS — Z87891 Personal history of nicotine dependence: Secondary | ICD-10-CM | POA: Insufficient documentation

## 2014-11-29 HISTORY — DX: Gastro-esophageal reflux disease without esophagitis: K21.9

## 2014-11-29 HISTORY — PX: ARTHROSCOPY, KNEE: SHX3174

## 2014-11-29 SURGERY — ARTHROSCOPY, KNEE
Anesthesia: Anesthesia General | Site: Knee | Laterality: Right | Wound class: Clean

## 2014-11-29 MED ORDER — LACTATED RINGERS IR SOLN
Status: DC | PRN
Start: 2014-11-29 — End: 2014-11-29
  Administered 2014-11-29: 3000 mL

## 2014-11-29 MED ORDER — LACTATED RINGERS IV SOLN
INTRAVENOUS | Status: DC
Start: 2014-11-29 — End: 2014-11-29

## 2014-11-29 MED ORDER — OXYCODONE-ACETAMINOPHEN 5-325 MG PO TABS
1.0000 | ORAL_TABLET | ORAL | 0 refills | Status: DC | PRN
Start: 2014-11-29 — End: 2016-07-22
  Filled 2014-11-29: qty 60, 5d supply, fill #0

## 2014-11-29 MED ORDER — ROPIVACAINE HCL 5 MG/ML IJ SOLN
INTRAMUSCULAR | Status: DC | PRN
Start: 2014-11-29 — End: 2014-11-29
  Administered 2014-11-29: 15 mL via PERINEURAL

## 2014-11-29 MED ORDER — DEXAMETHASONE SODIUM PHOSPHATE 4 MG/ML IJ SOLN
INTRAMUSCULAR | Status: AC
Start: 2014-11-29 — End: ?
  Filled 2014-11-29: qty 2

## 2014-11-29 MED ORDER — FENTANYL CITRATE (PF) 50 MCG/ML IJ SOLN (WRAP)
INTRAMUSCULAR | Status: DC | PRN
Start: 2014-11-29 — End: 2014-11-29
  Administered 2014-11-29: 100 ug via INTRAVENOUS

## 2014-11-29 MED ORDER — MIDAZOLAM HCL 2 MG/2ML IJ SOLN
INTRAMUSCULAR | Status: AC
Start: 2014-11-29 — End: ?
  Filled 2014-11-29: qty 2

## 2014-11-29 MED ORDER — PROPOFOL 10 MG/ML IV EMUL (WRAP)
INTRAVENOUS | Status: AC
Start: 2014-11-29 — End: ?
  Filled 2014-11-29: qty 20

## 2014-11-29 MED ORDER — PROPOFOL INFUSION 10 MG/ML
INTRAVENOUS | Status: DC | PRN
Start: 2014-11-29 — End: 2014-11-29
  Administered 2014-11-29: 160 ug/kg/min via INTRAVENOUS

## 2014-11-29 MED ORDER — EPINEPHRINE HCL 1 MG/ML IJ SOLN (WRAP)
Status: AC
Start: 2014-11-29 — End: ?
  Filled 2014-11-29: qty 1

## 2014-11-29 MED ORDER — LIDOCAINE HCL 2 % IJ SOLN
INTRAMUSCULAR | Status: DC | PRN
Start: 2014-11-29 — End: 2014-11-29
  Administered 2014-11-29: 100 mg

## 2014-11-29 MED ORDER — SCOPOLAMINE 1 MG/3DAYS TD PT72
MEDICATED_PATCH | TRANSDERMAL | Status: AC
Start: 2014-11-29 — End: ?
  Filled 2014-11-29: qty 1

## 2014-11-29 MED ORDER — ONDANSETRON HCL 4 MG/2ML IJ SOLN
INTRAMUSCULAR | Status: DC | PRN
Start: 2014-11-29 — End: 2014-11-29
  Administered 2014-11-29: 4 mg via INTRAVENOUS

## 2014-11-29 MED ORDER — SODIUM CHLORIDE 0.9 % IV SOLN
INTRAVENOUS | Status: DC
Start: 2014-11-29 — End: 2014-11-29

## 2014-11-29 MED ORDER — FENTANYL CITRATE (PF) 50 MCG/ML IJ SOLN (WRAP)
INTRAMUSCULAR | Status: AC
Start: 2014-11-29 — End: ?
  Filled 2014-11-29: qty 2

## 2014-11-29 MED ORDER — MIDAZOLAM HCL 2 MG/2ML IJ SOLN
INTRAMUSCULAR | Status: DC | PRN
Start: 2014-11-29 — End: 2014-11-29
  Administered 2014-11-29: 1 mg via INTRAVENOUS

## 2014-11-29 MED ORDER — ONDANSETRON HCL 4 MG/2ML IJ SOLN
INTRAMUSCULAR | Status: AC
Start: 2014-11-29 — End: ?
  Filled 2014-11-29: qty 2

## 2014-11-29 MED ORDER — EPHEDRINE SULFATE 50 MG/ML IJ SOLN
INTRAMUSCULAR | Status: AC
Start: 2014-11-29 — End: ?
  Filled 2014-11-29: qty 1

## 2014-11-29 MED ORDER — DEXAMETHASONE SODIUM PHOSPHATE 4 MG/ML IJ SOLN (WRAP)
INTRAMUSCULAR | Status: DC | PRN
Start: 2014-11-29 — End: 2014-11-29
  Administered 2014-11-29: 8 mg via INTRAVENOUS

## 2014-11-29 MED ORDER — PROPOFOL 10 MG/ML IV EMUL (WRAP)
INTRAVENOUS | Status: AC
Start: 2014-11-29 — End: ?
  Filled 2014-11-29: qty 60

## 2014-11-29 MED ORDER — EPINEPHRINE 1 MG/ML IJ SOLN
INTRAMUSCULAR | Status: DC | PRN
Start: 2014-11-29 — End: 2014-11-29
  Administered 2014-11-29: 1 mg

## 2014-11-29 MED ORDER — PROPOFOL INFUSION 10 MG/ML
INTRAVENOUS | Status: DC | PRN
Start: 2014-11-29 — End: 2014-11-29
  Administered 2014-11-29: 130 mg via INTRAVENOUS
  Administered 2014-11-29: 20 mg via INTRAVENOUS

## 2014-11-29 MED ORDER — SCOPOLAMINE 1 MG/3DAYS TD PT72
1.0000 | MEDICATED_PATCH | TRANSDERMAL | Status: DC
Start: 2014-11-29 — End: 2014-11-29
  Administered 2014-11-29: 1 via TRANSDERMAL

## 2014-11-29 MED ORDER — SODIUM CHLORIDE 0.9 % IR SOLN
Status: DC | PRN
Start: 2014-11-29 — End: 2014-11-29
  Administered 2014-11-29: 1000 mL

## 2014-11-29 MED ORDER — SODIUM CHLORIDE 0.9 % IV MBP
1.0000 g | INTRAVENOUS | Status: AC
Start: 2014-11-29 — End: 2014-11-29
  Administered 2014-11-29: 1 g via INTRAVENOUS

## 2014-11-29 MED ORDER — CEFAZOLIN SODIUM 1 G IJ SOLR
INTRAMUSCULAR | Status: AC
Start: 2014-11-29 — End: ?
  Filled 2014-11-29: qty 1000

## 2014-11-29 MED ORDER — SODIUM CHLORIDE 0.9 % IJ SOLN
INTRAMUSCULAR | Status: AC
Start: 2014-11-29 — End: ?
  Filled 2014-11-29: qty 10

## 2014-11-29 MED ORDER — BUPIVACAINE-EPINEPHRINE (PF) 0.5% -1:200000 IJ SOLN
INTRAMUSCULAR | Status: AC
Start: 2014-11-29 — End: ?
  Filled 2014-11-29: qty 30

## 2014-11-29 MED ORDER — FAMOTIDINE 20 MG/2ML IV SOLN
INTRAVENOUS | Status: AC
Start: 2014-11-29 — End: ?
  Filled 2014-11-29: qty 2

## 2014-11-29 SURGICAL SUPPLY — 37 items
ACCUPORT SIDE-DELIVERY CANNULA ×2 IMPLANT
BLADE AGGRESSIVE PLUS 4.0MM (Blade) ×2 IMPLANT
BNDG ACE ELASTIC 6IN STRL (Procedure Accessories) ×2 IMPLANT
CLOTH BEACON TIMEOUT ORANGE (Other) ×2 IMPLANT
DRAPE 3/4 SHEET FANFLD 52X76IN (Drape) ×4 IMPLANT
DRESSING PETRO 3% BI 3BRM GZE XR 8X1IN (Dressing) ×1
DRESSING PETROLATUM XEROFORM L8 IN X W1 (Dressing) ×1
DRESSING PETROLATUM XEROFORM L8 IN X W1 IN 3% BISMUTH TRIBROMOPHENATE (Dressing) ×1 IMPLANT
FILLER BONE ACCUFILL INJ 5CC (Cement) ×2 IMPLANT
GLOVE SRG NTR RBR 8 INDCTR BGL 299X103MM (Glove) ×1
GLOVE SURG BIOGEL LF SZ8 (Glove) ×4 IMPLANT
GLOVE SURGICAL 8 INDICATOR BIOGEL POWDER (Glove) ×1
GLOVE SURGICAL 8 INDICATOR BIOGEL POWDER FREE SMOOTH BEAD CUFF (Glove) ×1 IMPLANT
JUG OMNI 15000CC W SPECIMEN CA (Suction) ×2 IMPLANT
KIT INFECTION CONTROL CUSTOM (Kits) ×2
KIT INFECTION CONTROL CUSTOM IFOH03 (Kits) ×1 IMPLANT
KIT LIGHT HANDLE GLOVE (Drape) ×4 IMPLANT
PADDING CAST L4 YD X W4 IN UNDERCAST (Cast) ×2
PADDING CAST L4 YD X W4 IN UNDERCAST MILD STRETCH COHESIVE REGULAR (Cast) ×2 IMPLANT
PADDING CST CTTN WBRL 4YDX4IN LF STRL (Cast) ×2
PROBE ELECTROSURGICAL L135 MM 90 D (Instrument) ×1
PROBE ELECTROSURGICAL L135 MM 90 D SUCTION OD3.5 MM SERFAS ENERGY RF (Instrument) ×1 IMPLANT
PROBE SERFAS 90-S 3.5X90 DEG (Instrument) ×1
RESECTOR 3.5MM (Ortho Supply) IMPLANT
SCP DELIVERY SYRINGE 5 PACK ×2 IMPLANT
SCP STABILIZATION PIN 2.4 MM ×1 IMPLANT
SPONGE CHLRPRP TINT 26ML (Applicator) ×3 IMPLANT
SPONGE GAUZE L4 IN X W4 IN 16 PLY (Dressing) ×1
SPONGE GAUZE L4 IN X W4 IN 16 PLY MAXIMUM ABSORBENT USP TYPE VII (Dressing) ×1 IMPLANT
SPONGE GZE CTTN CRTY 4X4IN LF NS 16 PLY (Dressing) ×1
STRIP SKIN CLOSURE L4 IN X W1/2 IN (Dressing)
STRIP SKIN CLOSURE L4 IN X W1/2 IN REINFORCE STERI-STRIP POLYESTER (Dressing) ×1 IMPLANT
STRIP SKNCLS PLSTR STRSTRP 4X.5IN LF (Dressing)
SUTURE MONOCRYL 3-0 PS2 27IN (Suture) ×2 IMPLANT
SUTURE VICRYL 2-0 CT-2 (Suture) ×1 IMPLANT
TRAY KNEE ARTHROSCOPY (Tray) ×2 IMPLANT
TUBING FLOCNTRL ARTHRSCPY PUMP (Ortho Supply) ×2 IMPLANT

## 2014-11-29 NOTE — Transfer of Care (Signed)
Pt awake, breathing spontaneously, in NAD.  VSS. Report given to RN.

## 2014-11-29 NOTE — Anesthesia Preprocedure Evaluation (Signed)
Anesthesia Evaluation    AIRWAY    Mallampati: I    TM distance: >3 FB  Neck ROM: full  Mouth Opening:full   CARDIOVASCULAR           DENTAL         PULMONARY    pulmonary exam normal     OTHER FINDINGS    No cold/cough/fever, no GERD, no loose teeth    +PONV                  Anesthesia Plan    ASA 2     general                     intravenous induction   Detailed anesthesia plan: PNB and general LMA      Post Op: other  Post op pain management: per surgeon    informed consent obtained    Plan discussed with CRNA.

## 2014-11-29 NOTE — Brief Op Note (Signed)
BRIEF OP NOTE    Date Time: 11/29/2014 1:55 PM    Patient Name:   Audrey Ray    Date of Operation:   11/29/2014    Providers Performing:   Surgeon(s):  Joni Reining, MD  Bery[facility], PA    Assistant (s):   Circulator: Danelle Berry, RN  Relief Scrub: Leandro Reasoner  Scrub Person: Vassie Moment Eulah Pont  Preceptor: Imagene Riches, RN    Operative Procedure:   Procedure(s):  ARTHROSCOPY, KNEE, RIGHT KNEE PARTIAL MENISCECTOMY/SUBCHONDROPLASTY (ORIF TIBIAL PLATEU)     Preoperative Diagnosis:   Pre-Op Diagnosis Codes:     * Sprain of medial collateral ligament of right knee, initial encounter [S83.411A]     * Right knee pain [M25.561]     * Unspecified tear of unspecified meniscus, current injury, right knee, initial encounter [S83.206A]    Postoperative Diagnosis:   Post-Op Diagnosis Codes:     * Sprain of medial collateral ligament of right knee, initial encounter [S83.411A]     * Right knee pain [M25.561]     * Unspecified tear of unspecified meniscus, current injury, right knee, initial encounter [S83.206A]    Anesthesia:   Combo    Estimated Blood Loss:    * No values recorded between 11/29/2014 12:38 PM and 11/29/2014  1:53 PM *    Implants:     Implant Name Type Inv. Item Serial No. Manufacturer Lot No. LRB No. Used Action   FILLER BONE ACCUFILL INJ 5CC - [id number] Cement FILLER BONE ACCUFILL INJ 5CC  [employer] [id number] Right 1 Implanted   FILLER BONE ACCUFILL INJ 5CC - [id number] Cement FILLER BONE ACCUFILL INJ 5CC   [employer] [id number] Right 1 Implanted       Drains:   Drains: no    Specimens:       Findings:   SEE OP REPORT    Complications:   NONE      Signed by: Beryle [facility], PA                                                                           [facility]

## 2014-11-29 NOTE — Anesthesia Postprocedure Evaluation (Signed)
Anesthesia Post Evaluation    Patient: Audrey Ray    Procedures performed: Procedure(s) with comments:  ARTHROSCOPY, KNEE, RIGHT KNEE PARTIAL MENISCECTOMY/SUBCHONDROPLASTY (ORIF TIBIAL PLATEU)  - RIGHT KNEE PARTIAL MENISCECTOMY/SUBCHONDROPLASTY (ORIF TIBIAL PLATEU)      Anesthesia type: General LMA    Patient location:Phase I PACU    Last vitals:   Filed Vitals:    11/29/14 1420   BP:    Pulse: 74   Temp:    Resp: 14   SpO2: 98%       Post pain: Patient not complaining of pain, continue current therapy      Mental Status:awake    Respiratory Function: tolerating room air    Cardiovascular: stable    Nausea/Vomiting: patient not complaining of nausea or vomiting    Hydration Status: adequate    Post assessment: no apparent anesthetic complications

## 2014-11-29 NOTE — Anesthesia Procedure Notes (Addendum)
Peripheral  Patient location during procedure: Pre-Op  Reason for block: Post-op pain managment  Injection technique: Single-shot  Block Region: Adductor canal/Mid-thigh femoral  Laterality: Right  Block at surgeon's request Yes  Start time: 11/29/2014 11:28 AM  End time: 11/29/2014 11:32 AM    Staffing  Anesthesiologist: APTE JINDAL, Kveon Casanas SUNIL  Performed by: Anesthesiologist     Pre-procedure Checklist   Completed: patient identified, surgical consent, pre-op evaluation, timeout performed, risks and benefits discussed, anesthesia consent given and correct site  Timeout Completed:  11/29/2014 11:28 AM    Peripheral Block  Patient monitoring: Pulse oximetry, EKG, NIBP and Nasal cannula O2  Patient position: Supine  Sterile Technique: Chloraprep, Mask and Sterile gloves  Premedication: Yes and Meaningful contact maintained  Local infiltration: Lidocaine 1%    Needle  Needle type: Stim needle   Needle gauge: 21 G  Needle length: 4 in    Procedures: ultrasound guided  Ultrasound Guided: LA spread visualized, Needle visualized, Relevant anatomy identified (nerve, vessels, muscle) and Image stored or printed      Assessment   Incremental injection: yes  Injection made incrementally with aspirations every 5 mL.  Injection Resistance: no  Paresthesia Pain: No    Blood Aspirated: No  no suspected intravascular injection  Patient tolerated procedure well: Yes  Block Outcome: No complications

## 2014-11-29 NOTE — H&P (Signed)
Patient seen and evaluated 24 hours prior to planned procedure. No changes noted. Stable for planned procedure.     Chia Rock K. Axavier Pressley, PA-C

## 2014-11-29 NOTE — Discharge Instructions (Addendum)
Post Anesthesia Discharge Instructions    Although you may be awake and alert in the recovery room, small amounts of anesthetic remain in your system for about 24 hours.  You may feel tired and sleepy during this time.      You are advised to go directly home from the hospital.    Plan to stay at home and rest for the remainder of the day.    It is advisable to have someone with you at home for 24 hours after surgery.    Do not operate a motor vehicle, or any mechanical or electrical equipment for the next 24 hours.      Be careful when you are walking around, you may become dizzy.  The effects of anesthesia and/or medications are still present and drowsiness may occur    Do not consume alcohol, tranquilizers, sleeping medications, or any other non prescribed medication for the remainder of the day.    Diet:  begin with liquids, progress your diet as tolerated or as directed by your surgeon.  Nausea and vomiting may occur in the next 24 hours.    Voiding:  You need to urinate within 6-8 hours of your procedure. If you are unable to pass urine and are uncomfortable go to the nearest emergency department and notify your surgeon.    Weight bearing as tolerated with crutches. Patient to follow-up with Dr. Alfredo Bach in 2 weeks. She already has instructions to go home with.    Lauren K. Cully, PA-C    GOING HOME WITH A NERVE BLOCK     Your anesthesiologist has placed a nerve block to reduce pain following your surgery. Local anesthetic ("numbing medication") has been injected to numb the nerves that supply the site of your procedure. A healthcare provider will call you to check on your comfort and to answer any questions you have about your nerve block.    WHAT TO EXPECT  -  It is normal for your leg to feel numb or weak, this will gradually disappear over      12 - 24 hours.     TAKE YOUR PAIN MEDICATIONS  -  Even if you have no pain, remember to take your prescribed pain medications before      the nerve block wears off.  Take the first dose 10 hours after your surgery to prevent a      sudden onset of discomfort.    IF YOU HAD SURGERY ON YOUR LEG OR FOOT -   -  Wear your leg brace as directed by your surgeon, secure the brace before you get up.   -  Ask for help when getting up and moving around to avoid falling.    REMEMBER - SAFETY FIRST!!  -  Do not drive or operate potentially dangerous machinery for 24 hours after surgery.   -  Protect your leg from pressure, heat, and cold.  -  If you notice any of these symptoms, immediately call your anesthesiologist.      ~  Rash or hives     ~  Numbness around the mouth     ~  Metallic taste, ringing in the ears     ~  Lightheadedness     ~  Nervousness, or confusion     ~  Twitching, seizures or tremor     CALL us IF YOU HAVE QUESTIONS  -  [facility]      [phone]  7 am - 5 pm weekdays      [phone]     All other times

## 2014-11-29 NOTE — Progress Notes (Signed)
Block procedure completed with sedation. Time out performed with anesthesiologist. Patient tolerated the procedure with no adverse side effects. Procedure discussed prior to start. Peripheral nerve block education initiated, including fall precautions/sensory deficits and pain management.  Patient and responsible adult verbalized understanding.

## 2014-11-30 ENCOUNTER — Encounter: Payer: Self-pay | Admitting: Sports Medicine

## 2014-11-30 NOTE — Progress Notes (Signed)
Post-op Day 1    Date:  11/30/2014 Time:  12:34 PM      Visit Type:  Outpatient Phone Call:   Spoke with patient    Subjective:  No complaints    Objective:  Pain Score  3                    Motor Block  No                    Sensory Block  No                    Catheter site  N/A    Assessment:  Surgical pain controlled    Plan:  No further management. Po pain meds as needed.    Would pt receive block again?  Yes

## 2014-11-30 NOTE — Op Note (Signed)
Procedure Date: 11/29/2014     Patient Type: A     SURGEON: Joni Reining MD  ASSISTANT:  Beryle Flock PA     PREOPERATIVE DIAGNOSES:  1.  Posterior horn medial meniscus tear.  2.  Synovitis.  3.  Nondisplaced tibial plateau subchondral stress fracture.  4.  Nondisplaced medial femoral condyle subchondral stress fracture.     POSTOPERATIVE DIAGNOSES:  same     TITLE OF PROCEDURE:  1.  Partial medial meniscectomy, right knee.  2.  Partial synovectomy.  3.  Arthroscopically assisted open reduction and internal fixation tibial  plateau subchondral stress fracture.  4.  Open reduction and internal fixation/subchondroplasty medial femoral  condyle.  5.  Fluoroscopic guidance.     INDICATIONS:  Ms. Audrey Ray is a 64 year old female who presented with persistent pain and  discomfort in her right knee.  She had undergone an MRI scan that had  demonstrated some evidence of subchondral stress fracture of the medial  femoral condyle, as well as the medial tibial plateau and medial meniscus  tearing and synovitis.  Due to failure to respond to nonoperative methods  she was indicated for a right knee arthroscopy with arthroscopic partial  medial meniscectomy, along with arthroscopically aided treatment of a  proximal tibia fracture, along with percutaneous subchondroplasty of her  medial femoral condyle or a partial synovectomy.     ANESTHESIOLOGIST:  Dr. Thomasene Lot.     ANESTHESIA TYPE:  General plus regional.     COMPLICATIONS:  None.     DRAINS:  None.     DISPOSITION:  Patient was stable on arrival to the recovery room.     DESCRIPTION OF PROCEDURE:  All benefits, risks, and alternatives of procedure were discussed with  patient in the preoperative holding area.  The operative extremity was  correctly identified and clearly marked.  Patient was given prophylactic  intravenous antibiotics and brought to the operating theater.  After  adequate general anesthesia was administered, the right leg was prepped and  draped in sterile  fashion.  Next, a surgical pause was called, confirming  the right knee was the operative extremity.  Following this, we created an  anterolateral portal using standard technique.  We evaluated the  patellofemoral compartment which showed some signs of grade 2 and 1 small  area of grade 3 chondral change, but no evidence of grade 4 chondral  defects.  We did notice some synovitis appreciated in the suprapatellar  pouch as well as the medial gutter.  Next, we created our anteromedial  portal and inspected the anterior cruciate ligament.  It was found to be  intact, although there was a significant amount of synovitis within the  notch itself.  Partial synovectomy using the Vulcan wand was subsequently  performed without complication.  Next, we entered the medial compartment of  the knee.     Inspection of the medial femoral condyle and medial tibial plateau showed  only some minimal grade 2 changes, but no evidence of high-grade chondral  defects.  Because of this, we turned our attention to the medial meniscus  where there was a parrot beak, unstable flap tear of the medial meniscus  with the body of the meniscus flipped anteriorly over the anterior body and  anterior horn of the meniscus.  We subsequently proceeded with a partial  medial meniscectomy using a combination of meniscal shunts as well as the  arthroscopic shaver.  The meniscectomy extended from the posterior horn  into the body  and anterior portion of the body of the medial meniscus,  mostly in the white-white and a bit of the white-red zone.  Once we had  completed this, we then turned our attention laterally where inspection of  the lateral femoral condyle, lateral tibial plateau, and lateral meniscus  showed no evidence of damage.  Next, we moved back to the anterior  compartment.     Here, again we did notice some significant synovitis appreciated.  We  subsequently proceeded with a partial synovectomy using our Vulcan wand.   This was performed  without complication.  Once that was completed,  approximately 100 mL of fluid was run throughout the knee to remove any  loose debris.  We then turned our attention to the subchondral stress  fractures.     First, we addressed the tibial subchondral stress fracture under  fluoroscopic guidance.  The [employer] pin was drilled into place in the  area of the subchondral stress fracture.  At this point, once we had  determined on AP and lateral views, that the drill that was in good  position, we then proceeded with injection of the calcium phosphate cement  without complication.  As we let that set, we turned our attention to the  medial femoral condyle.  A small incision was made medially over the medial  epicondyle.  Next, under fluoroscopic guidance, we once again placed our  [employer] drillbit, subchondroplasty guide to the appropriate depth.   This was then followed by once again injection of calcium phosphate cement.   After allowing appropriate time for the cement to harden, both drill bits  were removed.  Final fluoroscopic views were taken to assure that there was  no evidence of calcium phosphate extravasation into the soft tissues.   After this, we once again entered the joint for diagnostic scope to assure  that there was no evidence of residual calcium phosphate noted in these  areas.  There was not.  Once this was completed, all arthroscopic equipment  was removed.  The portals were closed with 2-0 Vicryl followed by 3-0  Monocryl.  Both incisions over the tibia as well as the femoral condyle  were also closed with Vicryl and Monocryl.     Of note, my assistant, Coralyn Pear, was absolutely critical for patient  positioning, along with completion of the ORIF of the plateau, as well as  the subchondroplasty procedures.  In addition, her help was critical for  patient positioning, wound closure, and patient transport and the overall  safety of the patient.     POSTOPERATIVE PLAN:  I will allow Ms.  Masih to begin weightbearing with crutches immediately.   We will start her on an early course of physical therapy and put her on  appropriate p.o. pain medication.  Of note, I did request a regional pain  injection from Dr. Thomasene Lot to assist with postoperative pain control and  transition into oral pain medication.  I will see Ms. Aman back in the  office in approximately 2 weeks.           D:  11/29/2014 22:26 PM by Dr. Johnella Moloney. Alfredo Bach, MD ([id number])  T:  11/29/2014 23:20 PM by NTS      Everlean Cherry: [id number]) (Doc ID: [id number])

## 2015-03-26 DIAGNOSIS — I313 Pericardial effusion (noninflammatory): Secondary | ICD-10-CM

## 2015-03-26 DIAGNOSIS — I3139 Other pericardial effusion (noninflammatory): Secondary | ICD-10-CM

## 2015-03-26 HISTORY — DX: Other pericardial effusion (noninflammatory): I31.39

## 2015-03-26 HISTORY — DX: Pericardial effusion (noninflammatory): I31.3

## 2015-09-20 ENCOUNTER — Telehealth: Payer: Self-pay

## 2015-09-21 NOTE — Telephone Encounter (Signed)
I triaged pt and she has had a lot of COPD problems. On a lot of meds and also oxygen. I spoke to Laban Emperor, NP who said to bring her in for an OV prior to scheduling a colonoscopy. I have called and LMOM for a return call.

## 2015-09-21 NOTE — Telephone Encounter (Signed)
Pt has been scheduled for an OV with Walden Field, NP on 10/31/2015 at 11:00 PM due to COPD issues/meds.

## 2015-10-31 ENCOUNTER — Encounter: Payer: Self-pay | Admitting: Nurse Practitioner

## 2015-10-31 ENCOUNTER — Other Ambulatory Visit: Payer: Self-pay

## 2015-10-31 ENCOUNTER — Ambulatory Visit (INDEPENDENT_AMBULATORY_CARE_PROVIDER_SITE_OTHER): Payer: Medicare Other | Admitting: Nurse Practitioner

## 2015-10-31 DIAGNOSIS — Z1211 Encounter for screening for malignant neoplasm of colon: Secondary | ICD-10-CM

## 2015-10-31 DIAGNOSIS — J441 Chronic obstructive pulmonary disease with (acute) exacerbation: Secondary | ICD-10-CM

## 2015-10-31 MED ORDER — NA SULFATE-K SULFATE-MG SULF 17.5-3.13-1.6 GM/177ML PO SOLN: 1.0000 | ORAL | 0 refills | Status: DC

## 2015-10-31 NOTE — Progress Notes (Signed)
cc'ed to pcp °

## 2015-10-31 NOTE — Assessment & Plan Note (Signed)
65 year old female with no prior colonoscopy. She is currently significantly overdue. Generally asymptomatic from a GI standpoint. We'll proceed with the colonoscopy as needed. See notes above related to respiratory issues. We will message the intended endoscopist asked about the procedure given her BMI 54 and make any changes as needed. Return for follow-up based on postprocedure recommendations.  Proceed with colonoscopy with Dr. Oneida Alar in the near future. The risks, benefits, and alternatives have been discussed in detail with the patient. They state understanding and desire to proceed.   The patient is on Zoloft 100 mg daily. No other chronic pain medications, anxiolytics, anticoagulants, or antidepressants. Conscious sedation should be adequate for her procedure.

## 2015-10-31 NOTE — Patient Instructions (Signed)
1. We'll schedule your procedure for you. 2. We will notify respiratory therapy of your breathing issues that can be prepared for any CPAP or additional oxygen that may be needed. 3. Return for follow-up based on the recommendations made after your procedure.

## 2015-10-31 NOTE — Assessment & Plan Note (Signed)
Tissue significant COPD. Her symptoms and disease have improved with the addition of a new injectable medication. She does have CPAP at home that she uses at night along with oxygen. She is okay with lying on her left side. Good airflow noted today on exam. We will notify respiratory therapy to have CPAP available as well as close monitoring of O2 during her procedure. Proceed with colonoscopy as noted below.

## 2015-10-31 NOTE — Progress Notes (Addendum)
REVIEWED-NO ADDITIONAL RECOMMENDATIONS. NEEDS COLOWRAP XL AND CPAP.  Primary Care Physician:  Talmage Coin, MD Primary Gastroenterologist:  Dr. Oneida Alar  Chief Complaint  Patient presents with  . Colonoscopy    HPI:   Paula [name] is a 65 y.o. female who presents to schedule colonoscopy. Phone triage was deferred to office visit due to history of COPD as well as sedating medications. He is on oxygen at home. She does have sleep apnea as well. Her PO2 68 on room air, uses oxygen about 7 hours per day. Chest CT stable. No previous colonoscopy noted in our system. No previous colonoscopyincluded with patient's report.  Today she states her COPD is doing better with new injectable COPD medications. Oxygen levels "much better" on new medication. Still used O2 attached to her CPAP at night. Has never had a colonoscopy before. Denies persistent abdominal pain, N/V, hematochezia, melena, unintentional weight loss, fever, chills, sudden changes in bowel habits. Bowel habits vary somewhat with dietary intake: if eats a lot of fiber, has frequent stools; if not then tends to be slightly constipated. Denies chest pain, dyspnea, dizziness, lightheadedness, syncope, near syncope. Denies any other upper or lower GI symptoms.  Past Medical History:  Diagnosis Date  . Tubal ectopic pregnancy 1970    Past Surgical History:  Procedure Laterality Date  . APPENDECTOMY  1957  . BREAST SURGERY  1969   gland removed    Current Outpatient Prescriptions  Medication Sig Dispense Refill  . albuterol (PROVENTIL) (2.5 MG/3ML) 0.083% nebulizer solution Take 2.5 mg by nebulization every 6 (six) hours as needed for wheezing or shortness of breath.    Marland Kitchen arformoterol (BROVANA) 15 MCG/2ML NEBU Take 15 mcg by nebulization 2 (two) times daily.    . betamethasone dipropionate (DIPROLENE) 0.05 % cream Apply topically 2 (two) times daily.    . budesonide-formoterol (SYMBICORT) 160-4.5 MCG/ACT inhaler Inhale 2 puffs into  the lungs 2 (two) times daily.    . bumetanide (BUMEX) 1 MG tablet Take 1 mg by mouth daily.    . diclofenac (VOLTAREN) 75 MG EC tablet Take 75 mg by mouth 2 (two) times daily.    . fluticasone (VERAMYST) 27.5 MCG/SPRAY nasal spray Place 2 sprays into the nose daily.    Marland Kitchen losartan-hydrochlorothiazide (HYZAAR) 50-12.5 MG tablet Take 1 tablet by mouth daily.    . montelukast (SINGULAIR) 10 MG tablet Take 10 mg by mouth at bedtime.    . mupirocin ointment (BACTROBAN) 2 % Place 1 application into the nose 2 (two) times daily.    . NON FORMULARY INFUSION FOR COPD ONCE EVERY 2 WEEKS    . omalizumab (XOLAIR) 150 MG injection Inject 225 mg into the skin every 14 (fourteen) days.    Marland Kitchen omeprazole (PRILOSEC) 40 MG capsule Take 40 mg by mouth daily.    . sertraline (ZOLOFT) 100 MG tablet Take 100 mg by mouth daily.     No current facility-administered medications for this visit.     Allergies as of 10/31/2015 - Review Complete 10/31/2015  Allergen Reaction Noted  . Codeine Nausea Only 08/30/2014    No family history on file.  Social History   Social History  . Marital status: Married    Spouse name: N/A  . Number of children: N/A  . Years of education: N/A   Occupational History  . Not on file.   Social History Main Topics  . Smoking status: Never Smoker  . Smokeless tobacco: Never Used  . Alcohol use Yes  Comment: socialy   . Drug use: No  . Sexual activity: Not on file   Other Topics Concern  . Not on file   Social History Narrative  . No narrative on file    Review of Systems: 10-point ROS negative except as per HPI.    Physical Exam: BP 127/65 (BP Location: Right Arm, Patient Position: Sitting, Cuff Size: Large)   Pulse 74   Temp 97.8 F (36.6 C) (Oral)   Ht 5\' 1"  (1.549 m)   Wt 286 lb 6.4 oz (129.9 kg)   BMI 54.11 kg/m  General:   Morbidly obese female, alert and oriented. Pleasant and cooperative. Well-nourished and well-developed.  Head:  Normocephalic and  atraumatic. Eyes:  Without icterus, sclera clear and conjunctiva pink.  Ears:  Normal auditory acuity. Cardiovascular:  S1, S2 present without murmurs appreciated. Extremities without clubbing or edema. Respiratory:  Clear to auscultation bilaterally. No wheezes, rales, or rhonchi. No distress.  Gastrointestinal:  +BS, obese but soft, non-tender and non-distended. No HSM noted. No guarding or rebound. No masses appreciated.  Rectal:  Deferred  Musculoskalatal:  Symmetrical without gross deformities. Neurologic:  Alert and oriented x4;  grossly normal neurologically. Psych:  Alert and cooperative. Normal mood and affect. Heme/Lymph/Immune: No excessive bruising noted.    10/31/2015 12:00 PM   Disclaimer: This note was dictated with voice recognition software. Similar sounding words can inadvertently be transcribed and may not be corrected upon review.

## 2015-11-13 ENCOUNTER — Ambulatory Visit (HOSPITAL_COMMUNITY)
Admission: RE | Admit: 2015-11-13 | Discharge: 2015-11-13 | Disposition: A | Discharge status: Home / Self Care | Payer: Medicare Other | Source: Ambulatory Visit | Attending: Gastroenterology | Admitting: Gastroenterology

## 2015-11-13 ENCOUNTER — Encounter (HOSPITAL_COMMUNITY)
Admission: RE | Disposition: A | Discharge status: Home / Self Care | Payer: Self-pay | Source: Ambulatory Visit | Attending: Gastroenterology

## 2015-11-13 ENCOUNTER — Encounter (HOSPITAL_COMMUNITY): Payer: Self-pay | Admitting: *Deleted

## 2015-11-13 DIAGNOSIS — Z79899 Other long term (current) drug therapy: Secondary | ICD-10-CM | POA: Diagnosis not present

## 2015-11-13 DIAGNOSIS — J449 Chronic obstructive pulmonary disease, unspecified: Secondary | ICD-10-CM | POA: Insufficient documentation

## 2015-11-13 DIAGNOSIS — K648 Other hemorrhoids: Secondary | ICD-10-CM | POA: Insufficient documentation

## 2015-11-13 DIAGNOSIS — Z1211 Encounter for screening for malignant neoplasm of colon: Secondary | ICD-10-CM | POA: Diagnosis present

## 2015-11-13 DIAGNOSIS — K5730 Diverticulosis of large intestine without perforation or abscess without bleeding: Secondary | ICD-10-CM | POA: Diagnosis not present

## 2015-11-13 DIAGNOSIS — Z7951 Long term (current) use of inhaled steroids: Secondary | ICD-10-CM | POA: Diagnosis not present

## 2015-11-13 DIAGNOSIS — Z87891 Personal history of nicotine dependence: Secondary | ICD-10-CM | POA: Diagnosis not present

## 2015-11-13 DIAGNOSIS — D124 Benign neoplasm of descending colon: Secondary | ICD-10-CM | POA: Diagnosis not present

## 2015-11-13 DIAGNOSIS — I10 Essential (primary) hypertension: Secondary | ICD-10-CM | POA: Insufficient documentation

## 2015-11-13 DIAGNOSIS — D122 Benign neoplasm of ascending colon: Secondary | ICD-10-CM | POA: Insufficient documentation

## 2015-11-13 DIAGNOSIS — D123 Benign neoplasm of transverse colon: Secondary | ICD-10-CM | POA: Diagnosis not present

## 2015-11-13 DIAGNOSIS — D125 Benign neoplasm of sigmoid colon: Secondary | ICD-10-CM | POA: Diagnosis not present

## 2015-11-13 HISTORY — PX: COLONOSCOPY: SHX5424

## 2015-11-13 SURGERY — COLONOSCOPY
Anesthesia: Moderate Sedation

## 2015-11-13 MED ORDER — EPINEPHRINE HCL 0.1 MG/ML IJ SOSY
PREFILLED_SYRINGE | INTRAMUSCULAR | Status: AC
  Filled 2015-11-13: qty 10

## 2015-11-13 MED ORDER — SPOT INK MARKER SYRINGE KIT
PACK | SUBMUCOSAL | Status: DC | PRN
  Administered 2015-11-13: 2 mL via SUBMUCOSAL

## 2015-11-13 MED ORDER — SODIUM CHLORIDE 0.9 % IV SOLN
INTRAVENOUS | Status: DC
  Administered 2015-11-13: 10:00:00 via INTRAVENOUS

## 2015-11-13 MED ORDER — MEPERIDINE HCL 100 MG/ML IJ SOLN
INTRAMUSCULAR | Status: DC | PRN
  Administered 2015-11-13: 50 mg
  Administered 2015-11-13 (×2): 25 mg

## 2015-11-13 MED ORDER — MEPERIDINE HCL 100 MG/ML IJ SOLN
INTRAMUSCULAR | Status: AC
  Filled 2015-11-13: qty 2

## 2015-11-13 MED ORDER — SODIUM CHLORIDE 0.9 % IJ SOLN
PREFILLED_SYRINGE | INTRAMUSCULAR | Status: DC | PRN
  Administered 2015-11-13: 1 mL

## 2015-11-13 MED ORDER — MIDAZOLAM HCL 5 MG/5ML IJ SOLN
INTRAMUSCULAR | Status: AC
  Filled 2015-11-13: qty 10

## 2015-11-13 MED ORDER — MIDAZOLAM HCL 5 MG/5ML IJ SOLN
INTRAMUSCULAR | Status: DC | PRN
  Administered 2015-11-13: 2 mg via INTRAVENOUS
  Administered 2015-11-13: 1 mg via INTRAVENOUS
  Administered 2015-11-13: 2 mg via INTRAVENOUS
  Administered 2015-11-13: 1 mg via INTRAVENOUS

## 2015-11-13 MED ORDER — HYDROCORTISONE 2.5 % RE CREA: 1.0000 "application " | TOPICAL_CREAM | Freq: Two times a day (BID) | RECTAL | 1 refills | Status: DC

## 2015-11-13 NOTE — Op Note (Signed)
[facility] Patient Name: Paula [name] Procedure Date: 11/13/2015 10:18 AM MRN: [id number] Date of Birth: Oct 07, 1950 Attending MD: Barney Drain , MD CSN: [id number] Age: 65 Admit Type: Outpatient Procedure:                Colonoscopy with COLD FORCEPS/SNARE POLYPECTOMY &                            EPI/SPOT INJECTION/CLIPx1 Indications:              Screening for colorectal malignant neoplasm Providers:                Barney Drain, MD, Janeece Riggers, RN, Purcell Nails.                            Tina Griffiths, Technician Referring MD:             Rachel Bo. Harris Medicines:                Meperidine 100 mg IV, Midazolam 6 mg IV Complications:            No immediate complications. Estimated Blood Loss:     Estimated blood loss was minimal. Procedure:                Pre-Anesthesia Assessment:                           - Prior to the procedure, a History and Physical                            was performed, and patient medications and                            allergies were reviewed. The patient's tolerance of                            previous anesthesia was also reviewed. The risks                            and benefits of the procedure and the sedation                            options and risks were discussed with the patient.                            All questions were answered, and informed consent                            was obtained. Prior Anticoagulants: The patient has                            taken no previous anticoagulant or antiplatelet                            agents. ASA Grade Assessment: II - A patient with  mild systemic disease. After reviewing the risks                            and benefits, the patient was deemed in                            satisfactory condition to undergo the procedure.                            After obtaining informed consent, the colonoscope                            was passed under direct vision. Throughout  the                            procedure, the patient's blood pressure, pulse, and                            oxygen saturations were monitored continuously. The                            EC-[id number] scope was introduced through                            the anus and advanced to the the cecum, identified                            by appendiceal orifice and ileocecal valve. The                            ileocecal valve, appendiceal orifice, and rectum                            were photographed. The colonoscopy was somewhat                            difficult due to a tortuous colon. Successful                            completion of the procedure was aided by increasing                            the dose of sedation medication and COLOWRAP. The                            patient tolerated the procedure fairly well. The                            quality of the bowel preparation was good. Scope In: 10:58:40 AM Scope Out: 11:37:15 AM Scope Withdrawal Time: 0 hours 35 minutes 16 seconds  Total Procedure Duration: 0 hours 38 minutes 35 seconds  Findings:      Two sessile polyps were found in the transverse colon. The polyps were 2  to 4 mm in size. These polyps were removed with a cold biopsy forceps.       Resection and retrieval were complete.      Seven sessile polyps were found in the sigmoid colon, descending colon,       transverse colon and ascending colon. The polyps were 6 to 12 mm in       size. These polyps were removed with a hot snare. Resection and       retrieval were complete. Area was tattooed with an injection of 2 mL of       Spot (carbon black). Area was successfully injected with 1 mL of a       1:10,000 solution of epinephrine for improved access (by lifting the       lesion prior to destruction). To prevent bleeding post-intervention, one       hemostatic clip was successfully placed (MR conditional). There was no       bleeding at the end of the  procedure.      Many small and large-mouthed diverticula were found in the sigmoid       colon, descending colon and distal transverse colon.      Non-bleeding internal hemorrhoids were found. The hemorrhoids were       moderate. Impression:               - Two 2 to 4 mm polyps in the transverse colon,                            removed with a cold biopsy forceps. Resected and                            retrieved.                           - Seven 6 to 12 mm polyps in the sigmoid colon, in                            the descending colon, in the transverse colon and                            in the ascending colon, removed with a hot snare.                            Resected and retrieved.                           - Moderate diverticulosis in the sigmoid colon, in                            the descending colon and in the distal transverse                            colon.                           - Non-bleeding internal hemorrhoids. Moderate Sedation:      Moderate (conscious) sedation was administered by the endoscopy nurse  and supervised by the endoscopist. The following parameters were       monitored: oxygen saturation, heart rate, blood pressure, and response       to care. Total physician intraservice time was 52 minutes. Recommendation:           - High fiber diet.                           - Continue present medications.                           - Await pathology results.                           - Repeat colonoscopy 1-3 YEARS for surveillance.                           - Patient has a contact number available for                            emergencies. The signs and symptoms of potential                            delayed complications were discussed with the                            patient. Return to normal activities tomorrow.                            Written discharge instructions were provided to the                            patient. Procedure Code(s):         --- Professional ---                           [phone], Colonoscopy, flexible; with removal of                            tumor(s), polyp(s), or other lesion(s) by snare                            technique                           45380, 59, Colonoscopy, flexible; with biopsy,                            single or multiple                           99152, Moderate sedation services provided by the                            same physician or other qualified health care  professional performing the diagnostic or                            therapeutic service that the sedation supports,                            requiring the presence of an independent trained                            observer to assist in the monitoring of the                            patient's level of consciousness and physiological                            status; initial 15 minutes of intraservice time,                            patient age 103 years or older                           [phone], Moderate sedation services; each additional                            15 minutes intraservice time                           [phone], Moderate sedation services; each additional                            15 minutes intraservice time Diagnosis Code(s):        --- Professional ---                           Z12.11, Encounter for screening for malignant                            neoplasm of colon                           D12.5, Benign neoplasm of sigmoid colon                           D12.4, Benign neoplasm of descending colon                           D12.3, Benign neoplasm of transverse colon (hepatic                            flexure or splenic flexure)                           D12.2, Benign neoplasm of ascending colon                           K64.8, Other hemorrhoids  K57.30, Diverticulosis of large intestine without                            perforation or  abscess without bleeding CPT copyright 2016 [employer]. All rights reserved. The codes documented in this report are preliminary and upon coder review may  be revised to meet current compliance requirements. Barney Drain, MD Barney Drain, MD 11/13/2015 12:01:58 PM This report has been signed electronically. Number of Addenda: 0

## 2015-11-13 NOTE — Discharge Instructions (Signed)
You have internal hemorrhoids. YOU HAVE diverticulosis IN YOUR LEFT COLON & NINE POLYPS REMOVED. ONE WAS LARGER AND I TATTOOED THE BASE. I PLACED A CLIP TO PREVENT BLEEDING IN 7-10 DAYS.     NO MRI FOR 30 DAYS DUE TO METAL CLIP PLACEMENT IN THE COLON.  DRINK WATER TO KEEP YOUR URINE LIGHT YELLOW.  FOLLOW A HIGH FIBER DIET. AVOID ITEMS THAT CAUSE BLOATING. See info below.  CONTINUE YOUR WEIGHT LOSS EFFORTS. LOSE TEN POUNDS. YOUR BODY MASS INDEX IS 52. YOUR BODY MASS INDEX IS OVER 40 WHICH MEANS YOU ARE MORBIDLY OBESE. OBESITY IS ASSOCIATED WITH AN INCREASE FOR ALL CANCERS, INCLUDING ESOPHAGEAL AND COLON CANCER. A BMI OVER 40 SHORTENS YOUR LIFE EXPECTANCY BY 10 YEARS.  USE PREPARATION H  OR ANUSOL RECTAL CREAM  FOUR TIMESA DAY IF NEEDED TO RELIEVE RECTAL PAIN/PRESSURE/BLEEDING. RESTRICT USE OF HYDROCORTISONE SUPPOSITORIES TO 3-4 TIMES A YEAR.   YOUR BIOPSY RESULTS WILL BE AVAILABLE IN [facility] CHART  Aug 24 AND MY OFFICE WILL CONTACT YOU IN 10-14 DAYS WITH YOUR RESULTS.   Next colonoscopy in 1-3 years.  Colonoscopy Care After Read the instructions outlined below and refer to this sheet in the next week. These discharge instructions provide you with general information on caring for yourself after you leave the hospital. While your treatment has been planned according to the most current medical practices available, unavoidable complications occasionally occur. If you have any problems or questions after discharge, call DR. Diora Bellizzi, [phone].  ACTIVITY  You may resume your regular activity, but move at a slower pace for the next 24 hours.   Take frequent rest periods for the next 24 hours.   Walking will help get rid of the air and reduce the bloated feeling in your belly (abdomen).   No driving for 24 hours (because of the medicine (anesthesia) used during the test).   You may shower.   Do not sign any important legal documents or operate any machinery for 24 hours (because of the  anesthesia used during the test).    NUTRITION  Drink plenty of fluids.   You may resume your normal diet as instructed by your doctor.   Begin with a light meal and progress to your normal diet. Heavy or fried foods are harder to digest and may make you feel sick to your stomach (nauseated).   Avoid alcoholic beverages for 24 hours or as instructed.    MEDICATIONS  You may resume your normal medications.   WHAT YOU CAN EXPECT TODAY  Some feelings of bloating in the abdomen.   Passage of more gas than usual.   Spotting of blood in your stool or on the toilet paper  .  IF YOU HAD POLYPS REMOVED DURING THE COLONOSCOPY:  Eat a soft diet IF YOU HAVE NAUSEA, BLOATING, ABDOMINAL PAIN, OR VOMITING.    FINDING OUT THE RESULTS OF YOUR TEST Not all test results are available during your visit. DR. Oneida Alar WILL CALL YOU WITHIN 14 DAYS OF YOUR PROCEDUE WITH YOUR RESULTS. Do not assume everything is normal if you have not heard from DR. Julietta Batterman, CALL HER OFFICE AT [phone].  SEEK IMMEDIATE MEDICAL ATTENTION AND CALL THE OFFICE: [phone] IF:  You have more than a spotting of blood in your stool.   Your belly is swollen (abdominal distention).   You are nauseated or vomiting.   You have a temperature over 101F.   You have abdominal pain or discomfort that is severe or gets worse throughout the  day.  High-Fiber Diet A high-fiber diet changes your normal diet to include more whole grains, legumes, fruits, and vegetables. Changes in the diet involve replacing refined carbohydrates with unrefined foods. The calorie level of the diet is essentially unchanged. The Dietary Reference Intake (recommended amount) for adult males is 38 grams per day. For adult females, it is 25 grams per day. Pregnant and lactating women should consume 28 grams of fiber per day. Fiber is the intact part of a plant that is not broken down during digestion. Functional fiber is fiber that has been isolated  from the plant to provide a beneficial effect in the body. PURPOSE  Increase stool bulk.   Ease and regulate bowel movements.   Lower cholesterol.  REDUCE RISK OF COLON CANCER  INDICATIONS THAT YOU NEED MORE FIBER  Constipation and hemorrhoids.   Uncomplicated diverticulosis (intestine condition) and irritable bowel syndrome.   Weight management.   As a protective measure against hardening of the arteries (atherosclerosis), diabetes, and cancer.   GUIDELINES FOR INCREASING FIBER IN THE DIET  Start adding fiber to the diet slowly. A gradual increase of about 5 more grams (2 slices of whole-wheat bread, 2 servings of most fruits or vegetables, or 1 bowl of high-fiber cereal) per day is best. Too rapid an increase in fiber may result in constipation, flatulence, and bloating.   Drink enough water and fluids to keep your urine clear or pale yellow. Water, juice, or caffeine-free drinks are recommended. Not drinking enough fluid may cause constipation.   Eat a variety of high-fiber foods rather than one type of fiber.   Try to increase your intake of fiber through using high-fiber foods rather than fiber pills or supplements that contain small amounts of fiber.   The goal is to change the types of food eaten. Do not supplement your present diet with high-fiber foods, but replace foods in your present diet.   INCLUDE A VARIETY OF FIBER SOURCES  Replace refined and processed grains with whole grains, canned fruits with fresh fruits, and incorporate other fiber sources. White rice, white breads, and most bakery goods contain little or no fiber.   Brown whole-grain rice, buckwheat oats, and many fruits and vegetables are all good sources of fiber. These include: broccoli, Brussels sprouts, cabbage, cauliflower, beets, sweet potatoes, white potatoes (skin on), carrots, tomatoes, eggplant, squash, berries, fresh fruits, and dried fruits.   Cereals appear to be the richest source of fiber.  Cereal fiber is found in whole grains and bran. Bran is the fiber-rich outer coat of cereal grain, which is largely removed in refining. In whole-grain cereals, the bran remains. In breakfast cereals, the largest amount of fiber is found in those with "bran" in their names. The fiber content is sometimes indicated on the label.   You may need to include additional fruits and vegetables each day.   In baking, for 1 cup white flour, you may use the following substitutions:   1 cup whole-wheat flour minus 2 tablespoons.   1/2 cup white flour plus 1/2 cup whole-wheat flour.   Polyps, Colon  A polyp is extra tissue that grows inside your body. Colon polyps grow in the large intestine. The large intestine, also called the colon, is part of your digestive system. It is a long, hollow tube at the end of your digestive tract where your body makes and stores stool. Most polyps are not dangerous. They are benign. This means they are not cancerous. But over time, some types  of polyps can turn into cancer. Polyps that are smaller than a pea are usually not harmful. But larger polyps could someday become or may already be cancerous. To be safe, doctors remove all polyps and test them.   PREVENTION There is not one sure way to prevent polyps. You might be able to lower your risk of getting them if you:  Eat more fruits and vegetables and less fatty food.   Do not smoke.   Avoid alcohol.   Exercise every day.   Lose weight if you are overweight.   Eating more calcium and folate can also lower your risk of getting polyps. Some foods that are rich in calcium are milk, cheese, and broccoli. Some foods that are rich in folate are chickpeas, kidney beans, and spinach.    Diverticulosis Diverticulosis is a common condition that develops when small pouches (diverticula) form in the wall of the colon. The risk of diverticulosis increases with age. It happens more often in people who eat a low-fiber diet. Most  individuals with diverticulosis have no symptoms. Those individuals with symptoms usually experience belly (abdominal) pain, constipation, or loose stools (diarrhea).  HOME CARE INSTRUCTIONS  Increase the amount of fiber in your diet as directed by your caregiver or dietician. This may reduce symptoms of diverticulosis.   Drink at least 6 to 8 glasses of water each day to prevent constipation.   Try not to strain when you have a bowel movement.   Avoiding nuts and seeds to prevent complications is NOT NECESSARY.     FOODS HAVING HIGH FIBER CONTENT INCLUDE:  Fruits. Apple, peach, pear, tangerine, raisins, prunes.   Vegetables. Brussels sprouts, asparagus, broccoli, cabbage, carrot, cauliflower, romaine lettuce, spinach, summer squash, tomato, winter squash, zucchini.   Starchy Vegetables. Baked beans, kidney beans, lima beans, split peas, lentils, potatoes (with skin).   Grains. Whole wheat bread, brown rice, bran flake cereal, plain oatmeal, white rice, shredded wheat, bran muffins.    SEEK IMMEDIATE MEDICAL CARE IF:  You develop increasing pain or severe bloating.   You have an oral temperature above 101F.   You develop vomiting or bowel movements that are bloody or black.   Hemorrhoids Hemorrhoids are dilated (enlarged) veins around the rectum. Sometimes clots will form in the veins. This makes them swollen and painful. These are called thrombosed hemorrhoids. Causes of hemorrhoids include:  Constipation.   Straining to have a bowel movement.   HEAVY LIFTING  HOME CARE INSTRUCTIONS  Eat a well balanced diet and drink 6 to 8 glasses of water every day to avoid constipation. You may also use a bulk laxative.   Avoid straining to have bowel movements.   Keep anal area dry and clean.   Do not use a donut shaped pillow or sit on the toilet for long periods. This increases blood pooling and pain.   Move your bowels when your body has the urge; this will require less  straining and will decrease pain and pressure.

## 2015-11-13 NOTE — H&P (Signed)
Primary Care Physician:  Talmage Coin, MD Primary Gastroenterologist:  Dr. Oneida Alar  Pre-Procedure History & Physical: HPI:  Paula [name] is a 65 y.o. female here for [place].  Past Medical History:  Diagnosis Date  . Anxiety   . Arthritis   . COPD (chronic obstructive pulmonary disease) ([place])   . Hypertension   . Lung nodule    LLL  . Tubal ectopic pregnancy 1970    Past Surgical History:  Procedure Laterality Date  . APPENDECTOMY  1957  . BREAST SURGERY  1969   gland removed  . ECTOPIC PREGNANCY SURGERY  1978    Prior to Admission medications   Medication Sig Start Date End Date Taking? Authorizing Provider  albuterol (PROVENTIL) (2.5 MG/3ML) 0.083% nebulizer solution Take 2.5 mg by nebulization every 6 (six) hours as needed for wheezing or shortness of breath.   Yes Historical Provider, MD  arformoterol (BROVANA) 15 MCG/2ML NEBU Take 15 mcg by nebulization 2 (two) times daily.   Yes Historical Provider, MD  budesonide-formoterol (SYMBICORT) 160-4.5 MCG/ACT inhaler Inhale 2 puffs into the lungs 2 (two) times daily.   Yes Historical Provider, MD  bumetanide (BUMEX) 1 MG tablet Take 1 mg by mouth daily.   Yes Historical Provider, MD  diclofenac (VOLTAREN) 75 MG EC tablet Take 75 mg by mouth 2 (two) times daily.   Yes Historical Provider, MD  fluticasone (VERAMYST) 27.5 MCG/SPRAY nasal spray Place 2 sprays into the nose daily.   Yes Historical Provider, MD  losartan-hydrochlorothiazide (HYZAAR) 50-12.5 MG tablet Take 1 tablet by mouth daily.   Yes Historical Provider, MD  montelukast (SINGULAIR) 10 MG tablet Take 10 mg by mouth at bedtime.   Yes Historical Provider, MD  mupirocin ointment (BACTROBAN) 2 % Place 1 application into the nose 2 (two) times daily.   Yes Historical Provider, MD  Na Sulfate-K Sulfate-Mg Sulf (SUPREP BOWEL PREP KIT) 17.5-3.13-1.6 GM/180ML SOLN Take 1 kit by mouth as directed. 10/31/15  Yes Danie Binder, MD  NON FORMULARY INFUSION FOR  COPD ONCE EVERY 2 WEEKS   Yes Historical Provider, MD  omalizumab Arvid Right) 150 MG injection Inject 225 mg into the skin every 14 (fourteen) days.   Yes Historical Provider, MD  omeprazole (PRILOSEC) 40 MG capsule Take 40 mg by mouth daily.   Yes Historical Provider, MD  sertraline (ZOLOFT) 100 MG tablet Take 100 mg by mouth daily.   Yes Historical Provider, MD  betamethasone dipropionate (DIPROLENE) 0.05 % cream Apply topically 2 (two) times daily.    Historical Provider, MD    Allergies as of 10/31/2015 - Review Complete 10/31/2015  Allergen Reaction Noted  . Codeine Nausea Only 08/30/2014    Family History  Problem Relation Age of Onset  . Colon cancer Neg Hx     Social History   Social History  . Marital status: Married    Spouse name: N/A  . Number of children: N/A  . Years of education: N/A   Occupational History  . Not on file.   Social History Main Topics  . Smoking status: Former Smoker    Packs/day: 0.75    Years: 20.00    Types: Cigarettes  . Smokeless tobacco: Former Systems developer    Quit date: 10/31/1998  . Alcohol use Yes     Comment: about 1 drink a week  . Drug use: No  . Sexual activity: Not on file   Other Topics Concern  . Not on file   Social History Narrative  .  No narrative on file    Review of Systems: See HPI, otherwise negative ROS   Physical Exam: BP (!) 154/77   Pulse 63   Temp 97.6 F (36.4 C) (Oral)   Resp 16   Ht '5\' 1"'$  (1.549 m)   Wt 286 lb (129.7 kg)   SpO2 98%   BMI 54.04 kg/m  General:   Alert,  pleasant and cooperative in NAD Head:  Normocephalic and atraumatic. Neck:  Supple; Lungs:  Clear throughout to auscultation.    Heart:  Regular rate and rhythm. Abdomen:  Soft, nontender and nondistended. Normal bowel sounds, without guarding, and without rebound.   Neurologic:  Alert and  oriented x4;  grossly normal neurologically.  Impression/Plan:     SCREENING VARICES  PLAN:  1.EGD TODAY

## 2015-11-15 ENCOUNTER — Telehealth: Payer: Self-pay | Admitting: Gastroenterology

## 2015-11-15 NOTE — Telephone Encounter (Signed)
cc'ed to pcp °

## 2015-11-15 NOTE — Telephone Encounter (Signed)
Please call pt. She had EIGHT simple adenomas AND ONE HYPERPLASTIC POLYP removed.   NO MRI UNTIL DUE TO METAL CLIP PLACEMENT IN THE COLON.  DRINK WATER TO KEEP YOUR URINE LIGHT YELLOW.  FOLLOW A HIGH FIBER DIET. AVOID ITEMS THAT CAUSE BLOATING.   CONTINUE YOUR WEIGHT LOSS EFFORTS. LOSE TEN POUNDS.  USE PREPARATION H  OR ANUSOL RECTAL CREAM  FOUR TIMESA DAY IF NEEDED TO RELIEVE RECTAL PAIN/PRESSURE/BLEEDING. RESTRICT USE OF HYDROCORTISONE SUPPOSITORIES TO 3-4 TIMES A YEAR.   Next colonoscopy in 3 years. YOUR SISTERS, BROTHERS, CHILDREN, AND PARENTS NEED TO HAVE A COLONOSCOPY STARTING AT THE AGE OF 40.

## 2015-11-15 NOTE — Telephone Encounter (Signed)
Pt is aware.  

## 2015-11-16 ENCOUNTER — Encounter (HOSPITAL_COMMUNITY): Payer: Self-pay | Admitting: Gastroenterology

## 2015-12-18 ENCOUNTER — Emergency Department: Payer: Medicare Other

## 2015-12-18 ENCOUNTER — Emergency Department
Admission: EM | Admit: 2015-12-18 | Discharge: 2015-12-18 | Disposition: A | Discharge status: Home / Self Care | Payer: Medicare Other | Attending: Emergency Medical Services | Admitting: Emergency Medical Services

## 2015-12-18 DIAGNOSIS — Z87891 Personal history of nicotine dependence: Secondary | ICD-10-CM | POA: Insufficient documentation

## 2015-12-18 DIAGNOSIS — H9201 Otalgia, right ear: Secondary | ICD-10-CM | POA: Insufficient documentation

## 2015-12-18 LAB — BASIC METABOLIC PANEL
Anion Gap: 8 (ref 5.0–15.0)
BUN: 17 mg/dL (ref 7–19)
CO2: 26 mEq/L (ref 22–29)
Calcium: 8.3 mg/dL — ABNORMAL LOW (ref 8.5–10.5)
Chloride: 107 mEq/L (ref 100–111)
Creatinine: 1 mg/dL (ref 0.6–1.0)
Glucose: 91 mg/dL (ref 70–100)
Potassium: 4.7 mEq/L (ref 3.5–5.1)
Sodium: 141 mEq/L (ref 136–145)

## 2015-12-18 LAB — CBC AND DIFFERENTIAL
Absolute NRBC: 0 10*3/uL
Basophils Absolute Automated: 0.06 10*3/uL (ref 0.00–0.20)
Basophils Automated: 1.1 %
Eosinophils Absolute Automated: 0.03 10*3/uL (ref 0.00–0.70)
Eosinophils Automated: 0.5 %
Hematocrit: 41.7 % (ref 37.0–47.0)
Hgb: 14.2 g/dL (ref 12.0–16.0)
Immature Granulocytes Absolute: 0.01 10*3/uL
Immature Granulocytes: 0.2 %
Lymphocytes Absolute Automated: 1.45 10*3/uL (ref 0.50–4.40)
Lymphocytes Automated: 26.2 %
MCH: 30.2 pg (ref 28.0–32.0)
MCHC: 34.1 g/dL (ref 32.0–36.0)
MCV: 88.7 fL (ref 80.0–100.0)
MPV: 10.3 fL (ref 9.4–12.3)
Monocytes Absolute Automated: 0.32 10*3/uL (ref 0.00–1.20)
Monocytes: 5.8 %
Neutrophils Absolute: 3.67 10*3/uL (ref 1.80–8.10)
Neutrophils: 66.2 %
Nucleated RBC: 0 /100 WBC (ref 0.0–1.0)
Platelets: 116 10*3/uL — ABNORMAL LOW (ref 140–400)
RBC: 4.7 10*6/uL (ref 4.20–5.40)
RDW: 12 % (ref 12–15)
WBC: 5.54 10*3/uL (ref 3.50–10.80)

## 2015-12-18 LAB — GFR: EGFR: 55.4

## 2015-12-18 MED ORDER — CIPROFLOXACIN HCL 500 MG PO TABS
500.0000 mg | ORAL_TABLET | Freq: Two times a day (BID) | ORAL | 0 refills | Status: AC
Start: 2015-12-18 — End: 2015-12-28

## 2015-12-18 NOTE — ED Triage Notes (Signed)
Right ear canal pain since yesterday.  Pain increases when touching.  Denies fevers at home. choler implant right side.

## 2015-12-18 NOTE — Discharge Instructions (Signed)
Dear Ms. Audrey Ray:    I appreciate your choosing the [facility] for your healthcare needs, and hope your visit today was EXCELLENT.    Instructions:  Please follow-up with Dr. Ronne Binning (ENT Physician) as soon as possible.     Return to the Emergency Department for any worsening symptoms or concerns.    Below is some information that our patients often find helpful.    We wish you good health and please do not hesitate to contact us if we can ever be of any assistance.    Sincerely,  Harden Mo, MD  [facility]    ________________________________________________________________    If you do not continue to improve or your condition worsens, please contact your doctor or return immediately to the Emergency Department.    Thank you for choosing [facility] for your emergency care needs.  We strive to provide EXCELLENT care to you and your family.      DOCTOR REFERRALS  Call [phone] if you need any further referrals and we can help you find a primary care doctor or specialist.  Also, available online at:  [email or web address]    YOUR CONTACT INFORMATION  Before leaving please check with registration to make sure we have an up-to-date contact number.  You can call registration at [phone] to update your information.  For questions about your hospital bill, please call [phone].  For questions about your Emergency Dept Physician bill please call [phone].      FREE HEALTH SERVICES  If you need help with health or social services, please call 2-1-1 for a free referral to resources in your area.  2-1-1 is a free service connecting people with information on health insurance, free clinics, pregnancy, mental health, dental care, food assistance, housing, and substance abuse counseling.  Also, available online at:  http://www.[facility].org    MEDICAL RECORDS AND TESTS  Certain laboratory test results do not come  back the same day, for example urine cultures.   We will contact you if other important findings are noted.  Radiology films are often reviewed again to ensure accuracy.  If there is any discrepancy, we will notify you.      Please call [phone] to pick up a complimentary CD of any radiology studies performed.  If you or your doctor would like to request a copy of your medical records, please call [phone].      ORTHOPEDIC INJURY   Please know that significant injuries can exist even when an initial x-ray is read as normal or negative.  This can occur because some fractures (broken bones) are not initially visible on x-rays.  For this reason, close outpatient follow-up with your primary care doctor or bone specialist (orthopedist) is required.    MEDICATIONS AND FOLLOWUP  Please be aware that some prescription medications can cause drowsiness.  Use caution when driving or operating machinery.    The examination and treatment you have received in our Emergency Department is provided on an emergency basis, and is not intended to be a substitute for your primary care physician.  It is important that your doctor checks you again and that you report any new or remaining problems at that time.      24 HOUR PHARMACIES  [facility] - [address], [place], [place] [address] (1.4 miles, 7 minutes)  [facility] - [address], [place], [place] [address] (6.5 miles, 13 minutes)  Handout  with directions available on request

## 2015-12-18 NOTE — ED Provider Notes (Signed)
Physician/Midlevel provider first contact with patient: 12/18/15 1043         EMERGENCY DEPARTMENT HISTORY AND PHYSICAL EXAM    Date: 12/18/15  Patient Name: Audrey Ray  Attending Physician: Blanche East, MD  Patient DOB:  [birth year]  MRN:  [id number]  Room:  23/B23        History of Presenting Illness     Chief Complaint:    Chief Complaint   Patient presents with   . Otalgia       Historian:  Patient    65 y.o. female with h/o acoustic neuroma, craniotomy, tympanoplasty, mastoidectomy, and cochlear implant placement p/w sharp R otalgia (ranks 8/10) that began yesterday afternoon. Pt describes that her R ear also "feels full." Pt was treated for acoustic neuroma in 2015 and her R ear canal was surgically closed by ENT (Dr. Larita Fife). 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 Pt's previous sxs for acoustic neuroma included R ear "fullness" and hearing loss. Pt's most recent MRI done in February 2016 was negative for neuroma. Pt c/o tinnitus, which she says is normal for her. Of note, pt is planning to travel to [place] tomorroow for 2 weeks. Denies fever, L ear sxs, sinus congestion, ST, cough, and HA.      PMD: PcpHeriberto Ant[place]ua, MD      Past Medical History     Past Medical History:   Diagnosis Date   . Abnormal vision     Wear glasses   . Arthritis August 2016    right knee   . Depression 1990   . Disorder of musculoskeletal system 2009   . Gastroesophageal reflux disease     Controlled with med, states related with NSAID   . Headache 1990    Left side migraines a controlled by pamalor   . 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 Post-operative nausea and vomiting 1980    Has been controlled following during recent procedures       Past Surgical History     Past Surgical History:   Procedure Laterality Date   . ADENOIDECTOMY  1960   . ARTHROSCOPY, KNEE Right 11/29/2014    Procedure: ARTHROSCOPY, KNEE, RIGHT  KNEE PARTIAL MENISCECTOMY/SUBCHONDROPLASTY (ORIF TIBIAL PLATEU) ;  Surgeon: Joni Reining, MD;  [facility];  Service: Orthopedics;  Laterality: Right;  RIGHT KNEE PARTIAL MENISCECTOMY/SUBCHONDROPLASTY (ORIF TIBIAL PLATEU)     . CESAREAN SECTION  1995   . Marcy Panning APPROACH  04/14/2013    Procedure: Marcy Panning APPROACH;  Surgeon: Ericka Pontiff, MD;  [facility];  Service: Neurosurgery;  Laterality: Right;  RIGHT TRANSLABYRINTHINE APPROACH FOR VESTIBULAR SCHWANNOMA, ABDOMINAL FAT GRAFT   . HYSTERECTOMY  1995   . INSERTION, OSSEOINTEGRATED IMP/TEMPORAL BONE/BAHA  07/05/2013    Procedure: INSERTION, OSSEOINTEGRATED IMP/TEMPORAL BONE/BAHA;  Surgeon: Judie Bonus, MD PHD;  Location: [facility];  Service: ENT;  Laterality: N/A;  RIGHT BAHA   . KNEE ARTHROSCOPY W/ MENISCAL REPAIR Left 1999   . MASTOIDECTOMY, MODIFIED  04/14/2013    Procedure: MASTOIDECTOMY, MODIFIED;  Surgeon: Judie Bonus, MD PHD;  [facility];  Service: ENT;  Laterality: N/A;   . MASTOIDECTOMY, MODIFIED  07/05/2013    Procedure: MASTOIDECTOMY, MODIFIED;  Surgeon: Judie Bonus, MD PHD;  Location: [facility];  Service: ENT;  Laterality: N/A;  RIGHT  MIDDLE EAR OBLITERATION W/ OVERSEW OF EAR CANAL,   . MYRINGOPLASTY, FAT GRAFT  07/05/2013    Procedure: MYRINGOPLASTY, FAT GRAFT;  Surgeon: Judie Bonus, MD PHD;  Location: [facility];  Service: ENT;  Laterality: Right;   ABDOMINAL FAT GRAFT,    . TONSILLECTOMY  1960   . TYMPANOPLASTY, EXPLORATION , REMOVAL CHOLESTEATOMA  04/19/2013    Procedure: TYMPANOPLASTY, EXPLORATION , REMOVAL CHOLESTEATOMA;  Surgeon: Judie Bonus, MD PHD;  Location: [facility];  Service: ENT;  Laterality: Right;  Middle Ear Obliteration with fat graft harvest from abdomen; Control of CSF leak       Family History     No family history on file.    Social History     Social History     Social History   . Marital status: Married     Spouse name: N/A    . Number of children: N/A   . Years of education: N/A     Social History Main Topics   . Smoking status: Former Smoker     Packs/day: 1.00     Years: 1.00     Quit date: 11/10/1978   . Smokeless tobacco: Never Used   . Alcohol use Yes     5 Glasses of wine per week      Comment: This is in a week   . Drug use: No   . Sexual activity: Not Currently     Partners: Male     Birth control/ protection: Post-menopausal     Other Topics Concern   . Not on file     Social History Narrative   . No narrative on file       Allergies     No Known Allergies    Home Medications     Home medications reviewed by ED MD     Discharge Medication List as of 12/18/2015 12:31 PM      CONTINUE these medications which have NOT CHANGED    Details   calcium-vitamin D (OSCAL) 250-125 MG-UNIT per tablet Take 1 tablet by mouth daily., Until Discontinued, Historical Med      Cetirizine HCl (ZYRTEC ALLERGY) 10 MG Cap Take by mouth daily., Until Discontinued, Historical Med      esomeprazole (NEXIUM) 40 MG capsule Take 40 mg by mouth every morning before breakfast., Until Discontinued, Historical Med      magnesium 30 MG tablet Take 30 mg by mouth daily., Until Discontinued, Historical Med      Multiple Vitamin (MULTIVITAMIN) capsule Take 1 capsule by mouth daily., Until Discontinued, Historical Med      nortriptyline (PAMELOR) 75 MG capsule Take 75 mg by mouth nightly. , Until Discontinued, Historical Med      !! oxyCODONE-acetaminophen (PERCOCET) 5-325 MG per tablet 1-2 tablets PO Q4 hours prn pain, Print      !! oxyCODONE-acetaminophen (PERCOCET) 5-325 MG per tablet Take 1-2 tablets by mouth every 4 -6 hours as needed., Starting 11/29/2014, Until Discontinued, Normal      sucralfate (CARAFATE) 1 G tablet Take 1 tablet (1 g total) by mouth 4 (four) times daily., Starting 10/29/2014, Until Discontinued, Print      zolpidem (AMBIEN CR) 6.25 MG CR tablet Take 6.25 mg by mouth nightly as needed for Sleep., Until Discontinued, Historical Med       !! -  Potential duplicate medications found. Please discuss with provider.            Review of Systems  Constitutional:  No fever  ENT: +R otalgia, No L ear sxs, No sinus congestion, No ST  Resp:  No cough  Neuro:  No HA  All other systems reviewed and negative    Physical Exam     BP 120/80   Pulse 91   Temp 98.1 F (36.7 C)   Resp 18   Ht 5\' 7"  (1.702 m)   Wt 63.5 kg   SpO2 100%   BMI 21.93 kg/m     CONSTITUTIONAL Patient is afebrile, Vital signs reviewed.  HEAD Atraumatic, Normocephalic.  EYES No discharge from eyes, Sclera are normal.  EARS Tenderness of R ear over the R tragus. No significant swelling and no redness. R auditory canal has been closed off surgically. No mastoid tenderness. Cochlear implant appears to be in place. L TM appears normal.  NECK   Normal ROM, Cervical spine nontender  RESPIRATORY CHEST Chest is nontender, Breath sounds normal, No respiratory distress.  CARDIOVASCULAR RRR, Heart sounds normal.  ABDOMEN Abdomen is nontender, No peritoneal signs, No distension  BACK   There is no CVA tenderness, There is no tenderness to palpation  UPPER EXTREMITY No cyanosis, No edema  LOWER EXTREMITY No cyanosis, No edema  NEURO GCS is 15, No focal motor deficits, No focal sensory deficits.  SKIN Skin is warm, Skin is dry.  PSYCHIATRIC Normal affect, Normal insight    Monitors, EKG     EKG (interpreted by ED physician):     Cardiac Monitor (interpreted by ED physician):        Orders Placed During This Encounter     Orders Placed This Encounter   Procedures   . CT Internal Auditory Canals / Posterior Fossa WO Contrast   . CBC with differential   . Basic Metabolic Panel   . GFR   . ED Unit Sec Comm Order         ED Medications Administered     ED Medication Orders     None                Data Review     Nursing Records Reviewed and Agree: Yes  Laboratory results reviewed by ED provider: if applicable yes  Radiologic study results reviewed by ED provider:  If applicable yes      I, Blanche East, MD,  personally performed the services documented. Corlis Hove is scribing for me on [facility]. I reviewed and confirm the accuracy of the information in this medical record.    I, Corlis Hove, am serving as a scribe to document services personally performed by Blanche East, MD, based on the provider's statements to me.     Credentials: Corlis Hove, scribe    Rendering Provider: Blanche East, MD      Diagnostic Study Results     Labs     Results     Procedure Component Value Units Date/Time    Basic Metabolic Panel [[id number]]  (Abnormal) Collected:  12/18/15 1129    Specimen:  Blood Updated:  12/18/15 1149     Glucose 91 mg/dL      BUN 17 mg/dL      Creatinine 1.0 mg/dL      Calcium 8.3 (L) mg/dL      Sodium 409 mEq/L      Potassium 4.7 mEq/L      Chloride 107 mEq/L      CO2 26 mEq/L      Anion Gap 8.0    GFR [[id number]] Collected:  12/18/15 1129     Updated:  12/18/15 1149     EGFR 55.4    CBC with differential [[id number]]  (Abnormal) Collected:  12/18/15 1129    Specimen:  Blood from Blood Updated:  12/18/15 1146     WBC 5.54 x10 3/uL      Hgb 14.2 g/dL      Hematocrit 40.9 %      Platelets 116 (L) x10 3/uL      RBC 4.70 x10 6/uL      MCV 88.7 fL      MCH 30.2 pg      MCHC 34.1 g/dL      RDW 12 %      MPV 10.3 fL      Neutrophils 66.2 %      Lymphocytes Automated 26.2 %      Monocytes 5.8 %      Eosinophils Automated 0.5 %      Basophils Automated 1.1 %      Immature Granulocyte 0.2 %      Nucleated RBC 0.0 /100 WBC      Neutrophils Absolute 3.67 x10 3/uL      Abs Lymph Automated 1.45 x10 3/uL      Abs Mono Automated 0.32 x10 3/uL      Abs Eos Automated 0.03 x10 3/uL      Absolute Baso Automated 0.06 x10 3/uL      Absolute Immature Granulocyte 0.01 x10 3/uL      Absolute NRBC 0.00 x10 3/uL           Radiologic Studies  Radiology Results (24 Hour)     Procedure Component Value Units Date/Time    CT Internal Auditory Canals / Posterior Fossa WO Contrast [[id number]] Collected:  12/18/15 1211    Order Status:  Completed  Updated:  12/18/15 1222    Narrative:       HISTORY: 65 years old, ho acoustic neuroma with multiple post op  revisions due to csf leak. now with pain to rt ear,              TECHNIQUE: Routine  temporal Bone CT with axial 0.6 mm and coronal 1 mm  MPR images of the temporal bones were obtained without contrast. Dose  reduction with automatic exposure control, iterative reconstruction,  and/or adjustment of the mA and/or kV according to patient size.  COMPARISON: Limited MRI of the IAC dated 04/15/2013.  FINDINGS:  Right temporal bone:      Complex postoperative changes evident, with mastoidectomy. There is  fatty tissue within the operative defect consistent with graft. Large  portion of the anterior wall of the right sigmoid sinus appear  postoperatively deficient, with bone defect measuring 20 mm in size.  Mastoidectomy extends anteriorly and medially and extends to posterior  superior wall of the right internal to canal, with postoperative changes  of the vestibule, semicircular canals, with similar finding on prior  examination. There is improved aeration of the right petrous apex since  prior examination. No definite new opacification that would confirm  inflammatory changes, with partial opacification of residual mastoid air  cells which appear unchanged. Metallic device evident underneath the  scalp. No definite intracranial pathology on current temporal bone.    Left temporal bone:      The ossicles, the middle ear, the attic, and the mastoid air cells are  normal.   The cochlea,  and the vestibules are normal.  The semicircular canals, and the vestibular aqueduct are normal.  There is normal appearance of the internal auditory canal, carotid canal  and the jugular fossa.              Impression:          1.  Complex postoperative changes of the right mastoid and right inner  ear structures as noted above.         Einar Pheasant, MD   12/18/2015 12:17 PM        .        Procedures         MDM and  Clinical Notes     MDM:    Consults D/W:    1125: D/w Dr. Donn Pierini (covering for Dr. Larita Fife, ENT) who agrees with CT. If negative, okay to d/c on Cipro and f/u Dr. Ronne Binning, who is taking over for Dr. Jerolyn Center pts.    Reevaluation:       Diagnosis and Disposition   Diagnosis/Clinical Impression:  1. Otalgia, right        Disposition  ED Disposition     ED Disposition Condition Date/Time Comment    Discharge  Mon Dec 18, 2015 12:31 PM Audrey Ray discharge to home/self care.    Condition at disposition: Stable          Prescriptions    Discharge Medication List as of 12/18/2015 12:31 PM      START taking these medications    Details   ciprofloxacin (CIPRO) 500 MG tablet Take 1 tablet (500 mg total) by mouth 2 (two) times daily.for 10 days, Starting Mon 12/18/2015, Until Thu 12/28/2015, Print               Critical Care     Critical care exclusive of time spent performing procedures.    Total time:            Signout If Applicable     Patient signed out to:      Signout notes:               Harden Mo, MD  12/18/15 [phone]

## 2016-06-23 DIAGNOSIS — K5900 Constipation, unspecified: Secondary | ICD-10-CM

## 2016-06-23 HISTORY — DX: Constipation, unspecified: K59.00

## 2016-07-22 ENCOUNTER — Ambulatory Visit: Payer: Medicare Other | Attending: Gastroenterology

## 2016-07-23 DIAGNOSIS — K259 Gastric ulcer, unspecified as acute or chronic, without hemorrhage or perforation: Secondary | ICD-10-CM

## 2016-07-23 HISTORY — DX: Gastric ulcer, unspecified as acute or chronic, without hemorrhage or perforation: K25.9

## 2016-08-02 ENCOUNTER — Ambulatory Visit: Payer: Medicare Other | Admitting: Gastroenterology

## 2016-08-02 ENCOUNTER — Encounter
Admission: RE | Disposition: A | Discharge status: Home / Self Care | Payer: Self-pay | Source: Ambulatory Visit | Attending: Gastroenterology

## 2016-08-02 ENCOUNTER — Ambulatory Visit: Payer: Medicare Other | Admitting: Anesthesiology

## 2016-08-02 ENCOUNTER — Ambulatory Visit
Admission: RE | Admit: 2016-08-02 | Discharge: 2016-08-02 | Disposition: A | Discharge status: Home / Self Care | Payer: Medicare Other | Source: Ambulatory Visit | Attending: Gastroenterology | Admitting: Gastroenterology

## 2016-08-02 ENCOUNTER — Ambulatory Visit: Payer: Self-pay

## 2016-08-02 DIAGNOSIS — K635 Polyp of colon: Secondary | ICD-10-CM | POA: Insufficient documentation

## 2016-08-02 DIAGNOSIS — K5900 Constipation, unspecified: Secondary | ICD-10-CM | POA: Insufficient documentation

## 2016-08-02 DIAGNOSIS — R079 Chest pain, unspecified: Secondary | ICD-10-CM | POA: Insufficient documentation

## 2016-08-02 DIAGNOSIS — R1314 Dysphagia, pharyngoesophageal phase: Secondary | ICD-10-CM | POA: Insufficient documentation

## 2016-08-02 DIAGNOSIS — K648 Other hemorrhoids: Secondary | ICD-10-CM | POA: Insufficient documentation

## 2016-08-02 DIAGNOSIS — K219 Gastro-esophageal reflux disease without esophagitis: Secondary | ICD-10-CM

## 2016-08-02 DIAGNOSIS — Z86010 Personal history of colonic polyps: Secondary | ICD-10-CM | POA: Insufficient documentation

## 2016-08-02 DIAGNOSIS — K5730 Diverticulosis of large intestine without perforation or abscess without bleeding: Secondary | ICD-10-CM | POA: Insufficient documentation

## 2016-08-02 HISTORY — DX: Full incontinence of feces: R15.9

## 2016-08-02 HISTORY — DX: Cochlear implant status: Z96.21

## 2016-08-02 HISTORY — DX: Personal history of other benign neoplasm: Z86.018

## 2016-08-02 HISTORY — PX: EGD, COLONOSCOPY: SHX3799

## 2016-08-02 SURGERY — EGD, COLONOSCOPY
Anesthesia: Anesthesia General | Site: Abdomen | Wound class: Clean Contaminated

## 2016-08-02 MED ORDER — LIDOCAINE HCL 2 % IJ SOLN
INTRAMUSCULAR | Status: DC | PRN
Start: 2016-08-02 — End: 2016-08-02
  Administered 2016-08-02: 100 mg via INTRAVENOUS

## 2016-08-02 MED ORDER — ACETAMINOPHEN 325 MG PO TABS
ORAL_TABLET | ORAL | Status: AC
Start: 2016-08-02 — End: ?
  Filled 2016-08-02: qty 2

## 2016-08-02 MED ORDER — LACTATED RINGERS IV SOLN
INTRAVENOUS | Status: DC
Start: 2016-08-02 — End: 2016-08-02

## 2016-08-02 MED ORDER — ONDANSETRON HCL 4 MG/2ML IJ SOLN
INTRAMUSCULAR | Status: DC | PRN
Start: 2016-08-02 — End: 2016-08-02
  Administered 2016-08-02: 4 mg via INTRAVENOUS

## 2016-08-02 MED ORDER — GLYCOPYRROLATE 0.2 MG/ML IJ SOLN
INTRAMUSCULAR | Status: DC | PRN
Start: 2016-08-02 — End: 2016-08-02
  Administered 2016-08-02: .2 mg via INTRAVENOUS

## 2016-08-02 MED ORDER — FAMOTIDINE 10 MG/ML IV SOLN (WRAP)
INTRAVENOUS | Status: DC | PRN
Start: 2016-08-02 — End: 2016-08-02
  Administered 2016-08-02: 20 mg via INTRAVENOUS

## 2016-08-02 MED ORDER — PANTOPRAZOLE SODIUM 40 MG PO TBEC
40.0000 mg | DELAYED_RELEASE_TABLET | Freq: Two times a day (BID) | ORAL | 5 refills | Status: DC
Start: 2016-08-02 — End: 2016-10-11

## 2016-08-02 MED ORDER — PROPOFOL INFUSION 10 MG/ML
INTRAVENOUS | Status: DC | PRN
Start: 2016-08-02 — End: 2016-08-02
  Administered 2016-08-02 (×4): 50 mg via INTRAVENOUS
  Administered 2016-08-02: 20 mg via INTRAVENOUS
  Administered 2016-08-02 (×6): 50 mg via INTRAVENOUS

## 2016-08-02 MED ORDER — ACETAMINOPHEN 325 MG PO TABS
650.0000 mg | ORAL_TABLET | Freq: Once | ORAL | Status: AC
Start: 2016-08-02 — End: 2016-08-02
  Administered 2016-08-02: 650 mg via ORAL
  Filled 2016-08-02: qty 2

## 2016-08-02 SURGICAL SUPPLY — 41 items
BITE BLOCK MAXI 60F LATEX FREE (Procedure Accessories) ×1
BLOCK BITE MAXI 60FR LF STRD STRAP SDPRT (Procedure Accessories) ×1
BLOCK BITE OD60 FR STURDY STRAP SIDEPORT (Procedure Accessories) ×1
BLOCK BITE OD60 FR STURDY STRAP SIDEPORT DENTAL RETENTION RIM MAXI (Procedure Accessories) ×1 IMPLANT
FORCEPS BIOPSY L240 CM JUMBO MICROMESH (Instrument)
FORCEPS BIOPSY L240 CM JUMBO MICROMESH TEETH STREAMLINE CATHETER (Instrument) IMPLANT
FORCEPS BIOPSY L240 CM LARGE CAPACITY (Instrument) ×1
FORCEPS BIOPSY L240 CM MICROMESH TEETH STREAMLINE CATHETER NEEDLE (Instrument) IMPLANT
FORCEPS BX SS JMB RJ 4 2.8MM 240CM STRL (Instrument)
FORCEPS BX SS LG CPC RJ 4 2.4MM 240CM (Instrument) ×1
FORCEPS JAW RADIAL JUMBO (Instrument)
FORCEPS RAD JAW 4 BIOSPY W/NDL (Instrument) ×1
GAUZE SPONGE 4X4 NS (Dressing) ×1
GLOVES EXAM NITRILE ETS LG NS (Glove) ×3 IMPLANT
GOWN CP ELSTC WRIST REG/LG BL (Gown) ×2
GOWN ISL PP PE REG LG LF FULL BCK NK TIE (Gown) ×4
GOWN ISOLATION REGULAR LARGE FULL BACK NECK TIE ELASTIC CUFF (Gown) ×2 IMPLANT
MASK FLUID SHIELD W WRAP (Personal Protection) ×6 IMPLANT
NEEDLE CARR-LOCKE INJECT 25GX5 (Needles) IMPLANT
PAD ELECTROSRG GRND REM W CRD (Procedure Accessories) IMPLANT
SNARE CAPTIVATOR 13MMX240CM (GE Lab Supplies)
SNARE ESCP MIC CPTVTR 13MM 240IN STRL (GE Lab Supplies)
SNARE SMALL HEXAGON CAPTIVATOR STIFF ENDOSCOPIC POLYPECTOMY (GE Lab Supplies) IMPLANT
SPONGE GAUZE L4 IN X W4 IN 16 PLY (Dressing) ×1
SPONGE GAUZE L4 IN X W4 IN 16 PLY MAXIMUM ABSORBENT USP TYPE VII (Dressing) ×1 IMPLANT
SPONGE GZE CTTN CRTY 4X4IN LF NS 16 PLY (Dressing) ×1
SYRINGE 50 ML GRADUATE NONPYROGENIC DEHP (Syringes, Needles) ×1
SYRINGE 50 ML GRADUATE NONPYROGENIC DEHP FREE PVC FREE BD MEDICAL (Syringes, Needles) ×1 IMPLANT
SYRINGE MED 50ML LF STRL GRAD N-PYRG (Syringes, Needles) ×1
SYRINGE SLIP-TIP 60CC (Syringes, Needles) ×1
TRAP MCS PLS LF STRL SCR CAP TUBE ID LBL (Procedure Accessories)
TRAP MUCUS SCREW CAP TUBE ID LABEL (Procedure Accessories)
TRAP MUCUS SCREW CAP TUBE ID LABEL MEDLINE PLASTIC CLEAR (Procedure Accessories) IMPLANT
TRAP MUCUS SPEC 40CC (Procedure Accessories)
TRAP SPEC REM ETRAP 15CM LF STRL MAGNIFY (Procedure Accessories)
TRAP SPECIMEN ETRAP POLYP 15CM (Procedure Accessories)
TRAP SPECIMEN REMOVAL L15 CM MAGNIFY (Procedure Accessories)
TRAP SPECIMEN REMOVAL L15 CM MAGNIFY WINDOW MEASUREMENT GUIDE ETRAP (Procedure Accessories) IMPLANT
WATER STERILE PLASTIC POUR BOTTLE 250 ML (Irrigation Solutions) ×1 IMPLANT
WATER STRL 250ML LF PLS PR BTL (Irrigation Solutions) ×1
WATER STRL IRRIG 250ML BTL (Irrigation Solutions) ×1

## 2016-08-02 NOTE — Anesthesia Preprocedure Evaluation (Addendum)
Anesthesia Evaluation    AIRWAY    Mallampati: II    TM distance: >3 FB  Neck ROM: full  Mouth Opening:full   CARDIOVASCULAR    cardiovascular exam normal       DENTAL    no notable dental hx     PULMONARY    pulmonary exam normal     OTHER FINDINGS              Relevant Problems   No relevant active problems       PSS Anesthesia Comments: GERD, history of acoustic neuroma, constipation        Anesthesia Plan    ASA 2     general               (Risks discussed including but not limited to:    - Neurological complications such as stroke, TIA  - Cardiovascular complications such as heart attack, Arrhythmias, Cardiac arrest   - Pulmonary complications such as Asthmatic attack,Pulm. Aspiration, Bronchospasm and Pneumonia  - Intra-operative awareness,  - Dental Injuries  - Sore Throat.  - Allergic reactions.  - Death.     Anesthesia explained and Questions answered.     Pt understands and wishes to proceed.    York Cerise, MD)      intravenous induction           Post op pain management: per surgeon        Plan discussed with CRNA.                   Signed by: York Cerise 08/02/16 8:31 AM

## 2016-08-02 NOTE — Anesthesia Postprocedure Evaluation (Signed)
Anesthesia Post Evaluation    Patient: Krystena Reitter    Procedures performed: Procedure(s) with comments:  EGD, COLONOSCOPY - EGD/COLONOSCOPY  Q1=N    Anesthesia type: General TIVA    Patient location:PACU    Last vitals:   Vitals:    08/02/16 0904   BP: 116/70   Pulse: 73   Resp: 16   Temp:    SpO2: 100%       Post pain: Patient not complaining of pain, continue current therapy      Mental Status:awake    Respiratory Function: tolerating room air    Cardiovascular: stable    Nausea/Vomiting: patient not complaining of nausea or vomiting    Hydration Status: adequate    Post assessment: no apparent anesthetic complications    Signed by: York Cerise, 08/02/2016 10:55 AM

## 2016-08-02 NOTE — Brief Op Note (Signed)
Abdomin soft non-tender. Patient reports burning epigastric pain 8/10. Procedure/recovery plans reviewed with patient. Discharge/diet instructions reviewed with patient, no concerns voiced.

## 2016-08-02 NOTE — H&P (Signed)
GI PRE PROCEDURE NOTE    Proceduralist Comments:   Review of Systems and Past Medical / Surgical History performed: Yes     Indications:Abdominal pain, Dysphagia, Chest pain and GERD  and History of colonic polyps and Constipation    Previous Adverse Reaction to Anesthesia or Sedation (if yes, describe): No    Physical Exam / Laboratory Data (If applicable)   General: Alert and cooperative  Lungs: Lungs clear to auscultation  Cardiac: RRR, normal S1S2.    Abdomen: Soft, non tender. Normal active bowel sounds  Other:     No labs drawn    American Society of Anesthesiologists (ASA) Physical Status Classification:   Anesthesia ASA Score: 2          Planned Sedation:   Deep sedation with anesthesia    Attestation:   Audrey Ray has been reassessed immediately prior to the procedure and is an appropriate candidate for the planned sedation and procedure. Risks, benefits and alternatives to the planned procedure and sedation have been explained to the patient or guardian:  yes        Signed by: Juanetta Snow

## 2016-08-02 NOTE — Transfer of Care (Signed)
Anesthesia Transfer of Care Note    Patient: Audrey Ray    Procedures performed: Procedure(s) with comments:  EGD, COLONOSCOPY - EGD/COLONOSCOPY  Q1=N    Anesthesia type: General TIVA    Patient location:PACU    Last vitals:   Vitals:    08/02/16 0904   BP: 116/70   Pulse: 73   Resp: 16   Temp:    SpO2: 100%       Post pain: Patient not complaining of pain, continue current therapy      Mental Status:awake    Respiratory Function: tolerating room air    Cardiovascular: stable    Nausea/Vomiting: patient not complaining of nausea or vomiting    Hydration Status: adequate    Post assessment: no apparent anesthetic complications    Signed by: York Cerise  08/02/16 10:54 AM

## 2016-08-02 NOTE — Discharge Instr - AVS First Page (Signed)
Reason for your Hospital Admission:  ***      Instructions for after your discharge:  ***    EGD Discharge Instructions  General Instructions:  1. Following sedation, your judgement, perception, and coordination are considered impaired. Even though you may feel awake and alert, you are considered legally intoxicated. Therefore, until the next morning;   Do not Drive   Do not operate appliances or equipment that requires reaction time (e.g. Stove, electrical tools, machinery)   Do not sign legal documents or be involved in important decisions.   Do not smoke if alone   Do not drink alcoholic beverages   Go directly home and rest for several hours before resuming your routine activities.   It is highly recommended to have a responsible adult stay with you for the  next 24 hours    2. Tenderness, swelling or pain may occur at the IV site where you received sedation. If you experience this, apply warm soaks to the area. Notify your physician if this persists.    Instructions Specific To Procedures - Report To Physician Any Of The Following:    Upper Endoscopy, ERCP, Dilations   1. Pain in Chest   2. Nausea/vomitting   3. Fevers/Chills within 24 hours after procedure. Temp>101deg F   4. Severe and persistent abdominal pain and bloating     In Addition:   Mild throat soreness may follow this procedure. Warm salt water gargling or lozenges of your choice will most likely relieve your discomfort or cold drinks and  popsicles.       Additional Discharge Instructions  Your diet after the procedure: Start with something light (Toast, Jello, Soup, Etc.), Then Resume to Regular Diet as Tolerated. Nothing Spicy, Greasy or Fried Foods for the [employer]. NO RED FLUIDS, FOODS OR SAUCES FOR 24 HColonoscopy Discharge Instructions  General Instructions:  1. Following sedation, your judgement, perception, and coordination are considered impaired. Even though you may feel awake and alert, you are considered legally intoxicated.  Therefore, until the next morning;   Do not Drive   Do not operate appliances or equipment that requires reaction time (e.g.Stove, electrical tools, machinery)   Do not sign legal documents or be involved in important decisions.   Do not smoke if alone   Do not drink alcoholic beverages   Go directly home and rest for several hours before resuming your routine activities.   It is highly recommended to have a responsible adult stay with you for the next 24 hours    2. Tenderness, swelling or pain may occur at the IV site where you received sedation. If you experience this, apply warm soaks to the area. Notify your physician if this persists.    Instructions Specific To Procedures - Report To Physician Any Of The Following:    Colon/Sigmoidoscopy/Proctoscopy   1. Severe and persistent abdominal pain/bloating which does not subside within 2-3 hours   2. Large amount of rectal bleeding (some mucosal blood streaking may occur, especially if biopsy or polypectomy was done or if hemorrhoids are present.   3. Nausea/vomitting   4. Fevers/Chills within 24 hours after procedure. Temp>101deg F     In Addition:   If polyp has been removed, DO NOT take aspirin or aspirin containing products (e.g. Anacin, Alka Seltzer, Bufferin, Etc.) or non-steroidal anti-inflammatory drugs (e.g. Advil, Motrin, etc.) for *** days unless otherwise advised by doctor. Tylenol  or extra Strength Tylenol is permitted.    Additional Discharge Instructions  Your  diet after the procedure:  Start with something light (Toast, Jello, Soup, Etc.), Then Resume to Regular Diet as Tolerated. Nothing Spicy, Greasy or Fried Foods for the [employer].      If you have questions or problems contact your MD immediately. If you need immediate attention, call your MD, 911 and/or go to nearest emergency room.

## 2016-08-05 ENCOUNTER — Encounter: Payer: Self-pay | Admitting: Gastroenterology

## 2016-08-05 LAB — LAB USE ONLY - HISTORICAL SURGICAL PATHOLOGY

## 2016-10-08 ENCOUNTER — Ambulatory Visit: Payer: Medicare Other | Attending: Gastroenterology

## 2016-10-10 ENCOUNTER — Encounter: Payer: Self-pay | Admitting: Pain Medicine

## 2016-10-10 NOTE — Anesthesia Preprocedure Evaluation (Signed)
Anesthesia Evaluation    AIRWAY    Mallampati: I    TM distance: >3 FB  Neck ROM: full  Mouth Opening:full   CARDIOVASCULAR    cardiovascular exam normal       DENTAL    no notable dental hx     PULMONARY    pulmonary exam normal     OTHER FINDINGS              Relevant Problems   No relevant active problems               Anesthesia Plan    ASA 2     general                     intravenous induction   Detailed anesthesia plan: general IV        Post op pain management: per surgeon    informed consent obtained    Plan discussed with CRNA.                   Signed by: Loistine Chance 10/10/16 4:25 PM

## 2016-10-11 ENCOUNTER — Ambulatory Visit: Payer: Medicare Other | Admitting: Pain Medicine

## 2016-10-11 ENCOUNTER — Ambulatory Visit
Admission: RE | Admit: 2016-10-11 | Discharge: 2016-10-11 | Disposition: A | Discharge status: Home / Self Care | Payer: Medicare Other | Source: Ambulatory Visit | Attending: Gastroenterology | Admitting: Gastroenterology

## 2016-10-11 ENCOUNTER — Encounter
Admission: RE | Disposition: A | Discharge status: Home / Self Care | Payer: Self-pay | Source: Ambulatory Visit | Attending: Gastroenterology

## 2016-10-11 DIAGNOSIS — H9190 Unspecified hearing loss, unspecified ear: Secondary | ICD-10-CM | POA: Insufficient documentation

## 2016-10-11 DIAGNOSIS — K317 Polyp of stomach and duodenum: Secondary | ICD-10-CM | POA: Insufficient documentation

## 2016-10-11 DIAGNOSIS — K319 Disease of stomach and duodenum, unspecified: Secondary | ICD-10-CM | POA: Insufficient documentation

## 2016-10-11 DIAGNOSIS — K219 Gastro-esophageal reflux disease without esophagitis: Secondary | ICD-10-CM | POA: Insufficient documentation

## 2016-10-11 DIAGNOSIS — Z8719 Personal history of other diseases of the digestive system: Secondary | ICD-10-CM | POA: Insufficient documentation

## 2016-10-11 HISTORY — PX: EGD: SHX3789

## 2016-10-11 SURGERY — DONT USE, USE 1095-ESOPHAGOGASTRODUODENOSCOPY (EGD), DIAGNOSTIC
Anesthesia: Anesthesia General | Site: Abdomen | Wound class: Clean Contaminated

## 2016-10-11 MED ORDER — ONDANSETRON HCL 4 MG/2ML IJ SOLN
INTRAMUSCULAR | Status: AC
Start: 2016-10-11 — End: ?
  Filled 2016-10-11: qty 2

## 2016-10-11 MED ORDER — PROPOFOL 10 MG/ML IV EMUL (WRAP)
INTRAVENOUS | Status: AC
Start: 2016-10-11 — End: ?
  Filled 2016-10-11: qty 20

## 2016-10-11 MED ORDER — ONDANSETRON HCL 4 MG/2ML IJ SOLN
INTRAMUSCULAR | Status: DC | PRN
Start: 2016-10-11 — End: 2016-10-11
  Administered 2016-10-11: 4 mg via INTRAVENOUS

## 2016-10-11 MED ORDER — LACTATED RINGERS IV SOLN
INTRAVENOUS | Status: DC
Start: 2016-10-11 — End: 2016-10-11

## 2016-10-11 MED ORDER — FENTANYL CITRATE (PF) 50 MCG/ML IJ SOLN (WRAP)
INTRAMUSCULAR | Status: AC
Start: 2016-10-11 — End: ?
  Filled 2016-10-11: qty 2

## 2016-10-11 MED ORDER — FENTANYL CITRATE (PF) 50 MCG/ML IJ SOLN (WRAP)
INTRAMUSCULAR | Status: DC | PRN
Start: 2016-10-11 — End: 2016-10-11
  Administered 2016-10-11: 25 ug via INTRAVENOUS

## 2016-10-11 MED ORDER — MIDAZOLAM HCL 2 MG/2ML IJ SOLN
INTRAMUSCULAR | Status: DC | PRN
Start: 2016-10-11 — End: 2016-10-11
  Administered 2016-10-11: .5 mg via INTRAVENOUS

## 2016-10-11 MED ORDER — PROPOFOL INFUSION 10 MG/ML
INTRAVENOUS | Status: DC | PRN
Start: 2016-10-11 — End: 2016-10-11
  Administered 2016-10-11: 50 mg via INTRAVENOUS
  Administered 2016-10-11 (×2): 20 mg via INTRAVENOUS

## 2016-10-11 MED ORDER — LIDOCAINE HCL 2 % IJ SOLN
INTRAMUSCULAR | Status: DC | PRN
Start: 2016-10-11 — End: 2016-10-11
  Administered 2016-10-11: 60 mg via INTRAVENOUS

## 2016-10-11 MED ORDER — GLYCOPYRROLATE 1 MG/5ML IJ SOLN
INTRAMUSCULAR | Status: AC
Start: 2016-10-11 — End: ?
  Filled 2016-10-11: qty 5

## 2016-10-11 MED ORDER — GLYCOPYRROLATE 0.2 MG/ML IJ SOLN
INTRAMUSCULAR | Status: DC | PRN
Start: 2016-10-11 — End: 2016-10-11
  Administered 2016-10-11: 0.2 mg via INTRAVENOUS

## 2016-10-11 MED ORDER — LIDOCAINE HCL (PF) 2 % IJ SOLN
INTRAMUSCULAR | Status: AC
Start: 2016-10-11 — End: ?
  Filled 2016-10-11: qty 5

## 2016-10-11 MED ORDER — DEXLANSOPRAZOLE 60 MG PO CPDR
60.0000 mg | DELAYED_RELEASE_CAPSULE | Freq: Every day | ORAL | 5 refills | Status: DC
Start: 2016-10-11 — End: 2021-12-03

## 2016-10-11 MED ORDER — FAMOTIDINE 20 MG/2ML IV SOLN
INTRAVENOUS | Status: AC
Start: 2016-10-11 — End: ?
  Filled 2016-10-11: qty 2

## 2016-10-11 MED ORDER — FAMOTIDINE 10 MG/ML IV SOLN (WRAP)
INTRAVENOUS | Status: DC | PRN
Start: 2016-10-11 — End: 2016-10-11
  Administered 2016-10-11: 20 mg via INTRAVENOUS

## 2016-10-11 MED ORDER — MIDAZOLAM HCL 2 MG/2ML IJ SOLN
INTRAMUSCULAR | Status: AC
Start: 2016-10-11 — End: ?
  Filled 2016-10-11: qty 2

## 2016-10-11 SURGICAL SUPPLY — 43 items
BALLOON CRE DILTR 12-15MMX8CM (Balloons)
BITE BLOCK MAXI 60F LATEX FREE (Procedure Accessories) ×1
BLOCK BITE MAXI 60FR LF STRD STRAP SDPRT (Procedure Accessories) ×1
BLOCK BITE OD60 FR STURDY STRAP SIDEPORT (Procedure Accessories) ×1
BLOCK BITE OD60 FR STURDY STRAP SIDEPORT DENTAL RETENTION RIM MAXI (Procedure Accessories) ×1 IMPLANT
CATH BLN DIL BOSCI 15-18MMX8CM (Balloons)
CATH CRE 6F 10-12 8X180CM (Balloons)
CATHETER BALLOON DILATATION CRE 2.8 MM (Balloons)
CATHETER BALLOON DILATATION CRE PEBAX (Balloons)
CATHETER OD10-11-12 MM ODSEC6 FR L180 CM CRE BALLOON DILATATION L8 CM (Balloons) IMPLANT
CATHETER OD10-11-12 MM ODSEC6 FR L180 CM CREâ„¢ BALLOON DILATATION L8 CM (Balloons) IMPLANT
CATHETER OD15-16.5-18 MM ODSEC6 FR L180 CM CRE BALLOON DILATATION L8 (Balloons) IMPLANT
CATHETER OD15-16.5-18 MM ODSEC6 FR L180 CM CREâ„¢ BALLOON DILATATION L8 (Balloons) IMPLANT
CATHETER OD6 FR ODSEC12-13.5-15 MM L180 CM CRE BALLOON DILATATION L8 (Balloons) IMPLANT
CATHETER OD6 FR ODSEC12-13.5-15 MM L180 CM CREâ„¢ BALLOON DILATATION L8 (Balloons) IMPLANT
DILATOR ESCP PEBAX 2.8MM CRE 10-11-12MM (Balloons)
DILATOR ESCP PEBAX 2.8MM CRE 15-16.5-18 (Balloons)
DILATOR ESCP PEBAX CRE 6FR 12-13.5-15MM (Balloons)
FORCEPS BIOPSY L240 CM LARGE CAPACITY (Instrument) ×1
FORCEPS BIOPSY L240 CM MICROMESH TEETH STREAMLINE CATHETER NEEDLE (Instrument) ×1 IMPLANT
FORCEPS BX SS LG CPC RJ 4 2.4MM 240CM (Instrument) ×1
FORCEPS RAD JAW 4 BIOSPY W/NDL (Instrument) ×1
GAUZE SPONGE 4X4 NS (Dressing) ×1
GLOVES EXAM NITRILE ETS LG NS (Glove) ×6 IMPLANT
GOWN CP ELSTC WRIST REG/LG BL (Gown) ×2
GOWN ISL PP PE REG LG LF FULL BCK NK TIE (Gown) ×4
GOWN ISOLATION REGULAR LARGE FULL BACK NECK TIE ELASTIC CUFF (Gown) ×2 IMPLANT
SNARE CAPTIVATOR 13MMX240CM (GE Lab Supplies)
SNARE ESCP MIC CPTVTR 13MM 240IN STRL (GE Lab Supplies)
SNARE SMALL HEXAGON CAPTIVATOR STIFF ENDOSCOPIC POLYPECTOMY (GE Lab Supplies) IMPLANT
SPONGE GAUZE L4 IN X W4 IN 16 PLY (Dressing) ×1
SPONGE GAUZE L4 IN X W4 IN 16 PLY MAXIMUM ABSORBENT USP TYPE VII (Dressing) ×1 IMPLANT
SPONGE GZE CTTN CRTY 4X4IN LF NS 16 PLY (Dressing) ×1
SYRINGE 50 ML GRADUATE NONPYROGENIC DEHP (Syringes, Needles)
SYRINGE 50 ML GRADUATE NONPYROGENIC DEHP FREE PVC FREE BD MEDICAL (Syringes, Needles) IMPLANT
SYRINGE INFL 60ML ALN II STRL GA DISP (Syringes, Needles)
SYRINGE INFLATION 60 ML GAUGE CRE (Syringes, Needles) IMPLANT
SYRINGE INFLATION GAUGE 60CC (Syringes, Needles)
SYRINGE MED 50ML LF STRL GRAD N-PYRG (Syringes, Needles)
SYRINGE SLIP-TIP 60CC (Syringes, Needles)
WATER STERILE PLASTIC POUR BOTTLE 250 ML (Irrigation Solutions) ×1 IMPLANT
WATER STRL 250ML LF PLS PR BTL (Irrigation Solutions) ×1
WATER STRL IRRIG 250ML BTL (Irrigation Solutions) ×1

## 2016-10-11 NOTE — Addendum Note (Signed)
Addendum  created 10/11/16 0916 by Marene Lenz, CRNA    Anesthesia Intra Flowsheets edited

## 2016-10-11 NOTE — Discharge Instr - AVS First Page (Signed)
Endoscopy Discharge Instructions  General Instructions:  1. Following sedation, your judgement, perception, and coordination are considered impaired. Even though you may feel awake and alert, you are considered legally intoxicated. Therefore, until the next morning;   Do not Drive   Do not operate appliances or equipment that requires reaction time (e.g. stove, electrical tools, machinery)   Do not sign legal documents or be involved in important decisions.   Do not smoke if alone   Do not drink alcoholic beverages   Go directly home and rest for several hours before resuming your routine activities.   It is highly recommended to have a responsible adult stay with you for the next 24 hours    2. Tenderness, swelling or pain may occur at the IV site where you received sedation. If you experience this, apply warm soaks to the area. Notify your physician if this persists.    Instructions Specific To Procedures - Report To Physician Any Of The Following:    Upper Endoscopy, ERCP, Dilations   1. Pain in Chest   2. Nausea/vomitting   3. Fevers/Chills within 24 hours after procedure. Temp>101deg F   4. Severe and persistent abdominal pain and bloating     In Addition:   Mild throat soreness may follow this procedure. Warm salt water gargling or    lozenges of your choice will most likely relieve your discomfort or cold drinks and   popsicles.       Additional Discharge Instructions  Your diet after the procedure: Resume Previous Diet  Special Instructions: Avoid food and drinks red in color for 24 hours.  Patient education literature given: Procedural Report      If you have questions or problems contact your MD immediately. If you need immediate attention, call your MD, 911 and/or go to nearest emergency room.

## 2016-10-11 NOTE — Anesthesia Postprocedure Evaluation (Signed)
Anesthesia Post Evaluation    Patient: Audrey Ray    Procedures performed: Procedure(s) with comments:  EGD - EGD  Q1=N/A    Anesthesia type: General TIVA    Patient location:Phase II PACU    Last vitals:   Vitals:    10/11/16 0733   BP: 140/72   Pulse: 71   Resp: 14   Temp: 36.1 C (97 F)   SpO2: 100%       Post pain: Patient not complaining of pain, continue current therapy      Mental Status:awake    Respiratory Function: tolerating room air    Cardiovascular: stable    Nausea/Vomiting: patient not complaining of nausea or vomiting    Hydration Status: adequate    Post assessment: no apparent anesthetic complications    Signed by: Loistine Chance, 10/11/2016 8:21 AM

## 2016-10-11 NOTE — Addendum Note (Signed)
Addendum  created 10/11/16 0834 by Marene Lenz, CRNA    Anesthesia Intra Meds edited

## 2016-10-11 NOTE — H&P (Signed)
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    Abdomen: Soft, non tender. Normal active bowel sounds  Other:     No labs drawn    American Society of Anesthesiologists (ASA) Physical Status Classification:   Anesthesia ASA Score: 2          Planned Sedation:   Deep sedation with anesthesia    Attestation:   Esperansa Sarabia has been reassessed immediately prior to the procedure and is an appropriate candidate for the planned sedation and procedure. Risks, benefits and alternatives to the planned procedure and sedation have been explained to the patient or guardian:  yes        Signed by: Juanetta Snow

## 2016-10-11 NOTE — Progress Notes (Signed)
Dr. Nile Riggs aware of pts blood pressure.  MD states for pt to recheck at home.  Pt and friend agreeable.

## 2016-10-11 NOTE — Transfer of Care (Signed)
Anesthesia Transfer of Care Note    Patient: Audrey Ray    Procedures performed: Procedure(s) with comments:  EGD - EGD  Q1=N/A    Anesthesia type: General TIVA    Patient location:Phase II PACU    Last vitals:   Vitals:    10/11/16 0733   BP: 140/72   Pulse: 71   Resp: 14   Temp: 36.1 C (97 F)   SpO2: 100%       Post pain: Patient not complaining of pain, continue current therapy      Mental Status:awake and alert     Respiratory Function: tolerating room air    Cardiovascular: stable    Nausea/Vomiting: patient not complaining of nausea or vomiting    Hydration Status: adequate    Post assessment: no apparent anesthetic complications, no reportable events and no evidence of recall     Patient to PACU. Report to RN. All questions/comments/concerns addressed.   Patient denies pain at this time.      Signed by: Marene Lenz  10/11/16 8:18 AM

## 2016-10-14 ENCOUNTER — Encounter: Payer: Self-pay | Admitting: Gastroenterology

## 2016-10-14 LAB — LAB USE ONLY - HISTORICAL SURGICAL PATHOLOGY

## 2017-11-17 ENCOUNTER — Inpatient Hospital Stay: Payer: Medicare Other | Attending: Neurology | Admitting: Rehabilitative and Restorative Service Providers"

## 2017-11-17 ENCOUNTER — Encounter: Payer: Self-pay | Admitting: Rehabilitative and Restorative Service Providers"

## 2017-11-17 DIAGNOSIS — R42 Dizziness and giddiness: Secondary | ICD-10-CM | POA: Insufficient documentation

## 2017-11-17 NOTE — PT/OT Therapy Note (Signed)
Name: Audrey Ray Age: 67 y.o.   Today's Date: 11/17/2017  Referring Physician: Alphonse Guild, MD   Date of Injury: 11/12/2017  Date Care Plan Established/Reviewed: 11/17/2017  Date Treatment Started: 11/17/2017  End of Certification Date: 02/14/2018  Sessions in Plan of Care: 16  Surgery Date: No data was found    Visit Count: 1   Diagnosis:   1. Dizziness             Precautions: No data was found  Patient has no known allergies.                      ---      ---   Total Time   Timed Minutes  10 minutes   Untimed Minutes  30 minutes   Total Time  40 minutes      INITIAL EVALUATION (VESTIBULAR)  Occupation: retired     SUBJECTIVE:  Mechanism of Injury: January 2019 vertigo when standing up from chair. Pt states she did have cold during that time. Symptoms resolved within a minute. Patient still has vertigo with standing up from chair or bending over. Has hx of migraines - triggered by computer/TV, takes medicine which helps - last one was a month ago. No pressure sensitivity. Had motion sensitivity prior to acoustic neuroma sx, but no longer. Tingling/numbness B feet. Imbalance with walking in dark. 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 Had vestibular PT following acoustic neuroma for a couple months, which resolved.   Works out 3x/month, walks 2 half marathons per year  Other Treatment/Prior Therapy: Yes vestibular PT following acoustic neuroma sx. Meniscus tears.   Tests: MRI normal, but impaired signal due to R cochlear implant - MD not sure if possible acoustic neuroma R    Outcome Measure:   OUTCOMES IE    FOTO 86    ABC 78%    DHI 18      Dizziness IE     Current 0    Worst in past week 2    Best in past week 0      OBJECTIVE:  Vitals: NT    Observation/Posture/Gait/Integumentary  Observation/Posture: forward head and shoulders  Gait: WFL Gait Speed: WFL  Integumentary: No wound, lesion or rash noted    Musculoskeletal  Screen:  Cervical AROM: WFL    MMT     R L  R L   Shoulder flex   Hip flex     Shoulder abd   Knee ext     Biceps   Knee flex     Triceps   Ankle DF     Wrist flex   Ankle PF     Wrist ext        (blank fields were intentionally left blank)    Palpation: N/A    LE Sensation:  Light touch: NT  Proprioception: NT    Coordination:  . Finger to nose: NT  . Heel to shin: NT  . Dysdiadochokinesia: NT    Special Tests/Neurological Screen:    Test Results IE   Vertebral Artery NT   Spontaneous Nystagmus (fixation present) (-)   Spontaneous Nystagmus (fixation suppressed) (-)   Gaze-evoked Nystagmus (fixation present) (-)   Gaze-evoked Nystagmus (fixation suppressed) (-)   Near [place] Convergence    Smooth Pursuit intact   Saccade H intact   Saccade V intact   VOR H intact  VOR V intact   VOR Cancellation intact   Static Visual Acuity and Dynamic Visual Acuity SVA: 10/40  DVA: 10/12.5 (5 line difference)   Modified CTSIB NT   SLS R: 20  L: 30   Short Form DGI 9/12      IE R IE L   Head Thrust Test (+) (+)   Dix-Hallpike (-) (-)   Roll Test (-) (-)     Treatment Initial Visit:  Evaluation  TherAct: Patient Education on physiology, course of tx  NMR with instruction in HEP and provided patient written and illustrated handout Yes  Manual N/A  Modalities: None  For Next Visit Add modified CTSIB, head shaking test  Pt deaf R, hearing loss L  Pt will be travelling to [place] for next few weeks and transition to [place] location when she returns        Exercise Flow Sheet    Exercise Specifics                VORx1               VORx2                 rockerboard                 remembered targets                 Eye-head                   EC on floor normal BOS                                                                                                    Home Exercise Program                 (Initials = supervised exercise by clinician)  Access Code: [email or web address]   URL: [email or web address]   Date: 11/17/2017    Prepared by: Jayme Cloud     Exercises   Standing Gaze Stabilization with Head Rotation - 2 sets - 30 seconds hold - 3x daily - 7x weekly   Standing Gaze Stabilization with Head Nod - 2 sets - 2 reps - 30 seconds hold - 3x daily - 7x weekly   Standing Gaze Stabilization with Head Rotation and Horizontal Arm Movement - 2 sets - 30 seconds hold - 3x daily - 7x weekly   Standing Gaze Stabilization with Head Nod and Vertical Arm Movement - 2 sets - 30 seconds hold - 3x daily - 7x weekly        Goals    Goal 1:  Patient will demonstrate independence in prescribed HEP with proper form, sets and reps for safe discharge to an independent program.     Sessions:  16      Goal 2:  Patient will decrease DHI to 6%.   Sessions:  16      Goal 3:  Patient will improve short form DGI to 11/12.   Sessions:  16      Goal 4:  Patient will  improve DVA to within 3 lines of SVA for bending over to pick up object without vertigo.   Sessions:  [address] PT

## 2017-11-17 NOTE — Progress Notes (Signed)
Name:Audrey Ray Age: 67 y.o.   Today's Date: 11/17/2017  Referring Physician: Alphonse Guild, MD   Date of Injury: 11/12/2017  Date Care Plan Established/Reviewed: 11/17/2017  Date Treatment Started: 11/17/2017  End of Certification Date: 02/14/2018  Sessions in Plan of Care: 16  Surgery Date: No data was found      Visit Count: 1   Diagnosis:   1. Dizziness        Subjective     History of Present Illness   Functional Limitations (PLOF): Current: vertigo with standing up from chair, bending over, imbalance with walking in dark  PLOF: no vertigo/imbalance with above      Precautions: No data was found  Patient has no known allergies.  Past Medical History:   Diagnosis Date   . Abdominal pain 06/2016   . Abnormal vision     Wear glasses   . Arthritis August 2016    right knee   . Cochlear implant in place    . Constipation 06/2016   . Depression 1990   . Disorder of musculoskeletal system 2009   . Fecal incontinence    . Gastric ulceration 07/2016   . Gastroesophageal reflux disease     Controlled with med, states related with NSAID   . Headache 1990    Left side migraines a controlled by pamalor   . 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 History of acoustic neuroma    . Post-operative nausea and vomiting 1980    Has been controlled following during recent procedures                         ---      ---   Total Time   Timed Minutes  10 minutes   Untimed Minutes  30 minutes   Total Time  40 minutes        Assessment   Latricia is a 67 y.o. 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, with recent L unilateral vestibular hypofunction developing January 2019) who requires Physical Therapy for the following:  Impairments: impaired gaze stabilization, decreased balance    Pain located: N/A    Clinical presentation: stable   Barriers to therapy: Hx of vertigo  Prior Level of Function: Current:  vertigo with standing up from chair, bending over, imbalance with walking in dark  PLOF: no vertigo/imbalance with above  Plan   Visits per week: 2  Number of Sessions: 16  [employer]  08657: Therapeutic Exercise: To Develop Strength and Endurance, ROM and Flexibility  [id number]: Neuromuscular Reeducation (gaze stabilization, balance)  [id number]: Therapeutic Activities: Dynamic activities to improve functional performance    Goals    Goal 1:  Patient will demonstrate independence in prescribed HEP with proper form, sets and reps for safe discharge to an independent program.     Sessions:  16      Goal 2:  Patient will decrease DHI to 6%.   Sessions:  16      Goal 3:  Patient will improve short form DGI to 11/12.   Sessions:  16      Goal 4:  Patient will improve DVA to within 3 lines of SVA for bending over to pick up object without vertigo.   Sessions:  16  Audrey Ray, PT

## 2017-12-10 NOTE — PT/OT Therapy Note (Signed)
Name: Audrey Ray Age: 67 y.o.   Date of Service: 12/12/2017  Referring Physician: Alphonse Guild, MD   Date of Injury: 11/12/2017  Date Care Plan Established/Reviewed: 11/17/2017  Date Treatment Started: 11/17/2017  End of Certification Date: 02/14/2018  Sessions in Plan of Care: 16  Surgery Date: No data was found    Visit Count: 2   Diagnosis:   1. Dizziness                               Subjective     History of Present Illness   Mechanism of injury:  B VH : R acoustic neuroma sx 2015 uncompensated (R [facility]); L [facility]  History of Present Illness: Audrey Ray has been out of the country [place] 3 weeks since IE and has been doing her HEP. Dizziness is less      Precautions: No data was found  Allergies: Patient has no known allergies.                   OUTCOMES IE    FOTO 86    ABC 78%    DHI 18      Dizziness IE 12/12/17    Current 0 0   Worst in past week 2 5   Best in past week 0 0     Special Tests/Neurological Screen:    Test Results IE 12/12/17    Vertebral Artery NT     Spontaneous Nystagmus (fixation present) (-)     Spontaneous Nystagmus (fixation suppressed) (-)     Gaze-evoked Nystagmus (fixation present) (-)     Gaze-evoked Nystagmus (fixation suppressed) (-)     Near [employer]      Smooth Pursuit intact     Saccade H intact     Saccade V intact     VOR H intact     VOR V intact     VOR Cancellation intact     Static Visual Acuity and Dynamic Visual Acuity SVA: 10/40  DVA: 10/12.5 (5 line difference)     Modified CTSIB NT    mild difficulty condition 4  See media for details    SLS R: 20  L: 30     Short Form DGI 9/12        IE R IE L   Head Thrust Test (+) (+)   Dix-Hallpike (-) (-)   Roll Test (-) (-)       Treatment     Neuromuscular Re-Education   Justification: Gaze stabilization,habituation, and balance ex for improved stability with ADL's   See flow sheet for details    Cueing (verbal,,visual, and tactile cues) for proper exercise technique and speed with exercise    Therapeutic  Activity   Justification: To improve functional mobility/Education  NA          ---      ---   Total Time   Timed Minutes  45 minutes   Total Time  45 minutes                                                         Exercise Flow Sheet    Exercise Specifics 12/12/17  VORx1 H ,V conflict backround  30" x2  PH             VORx2   H,V 30" eH  PH -            rockerboard   Fb/ss  2x30" ea   PH             remembered targets    1'  PH             Eye-head    1'  PH               EC on foam normal BOS  30" x2  PH               Walk with head turns H, V -                                                                                 Home Exercise Program    remembered target,  Eye/head,   VOR x1 conflict backround             (Initials = supervised exercise by clinician)  Access Code: [email or web address]   URL: [email or web address]   Date: 11/17/2017   Prepared by: Jayme Cloud     Exercises    Standing Gaze Stabilization with Head Rotation - 2 sets - 30 seconds hold - 3x daily - 7x weekly    Standing Gaze Stabilization with Head Nod - 2 sets - 2 reps - 30 seconds hold - 3x daily - 7x weekly    Standing Gaze Stabilization with Head Rotation and Horizontal Arm Movement - 2 sets - 30 seconds hold - 3x daily - 7x weekly    Standing Gaze Stabilization with Head Nod and Vertical Arm Movement - 2 sets - 30 seconds hold - 3x daily - 7x weekly       Assessment   2nd visit. Decreasing symptoms. Performed additional testing today: modified CTsib.  First full treatment session today. Pt had difficulty with coordination of VOR X2 so discontinued.    Much cueing today for proper exercise technique        Pt deaf R, hearing loss L  Plan   Continue with POC Head turns with walking      Goals    Goal 1:  Patient will demonstrate independence in  prescribed HEP with proper form, sets and reps for safe discharge to an independent program.  12/12/17 :PH   Sessions:  16   Progression:  progressing      Goal 2:  Patient will decrease DHI to 6%.   Sessions:  16      Goal 3:  Patient will improve short form DGI to 11/12.   Sessions:  16      Goal 4:  Patient will improve DVA to within 3 lines of SVA for bending over to pick up object without vertigo.   Sessions:  [address]                                Gaynelle Arabian, PT

## 2017-12-10 NOTE — Progress Notes (Deleted)
Name:Selin Bonna Gains Age: 67 y.o.   Date of Service: 12/12/2017  Referring Physician: Alphonse Guild, MD   Date of Injury: No data was found  Date Care Plan Established/Reviewed: No data was found  Date Treatment Started: No data was found  End of Certification Date: No data was found  Sessions in Plan of Care: No data was found  Surgery Date: No data was found      Visit Count: Visit count could not be calculated. Make sure you are using a visit which is associated with an episode.   Diagnosis: No diagnosis found.         Precautions: No data was found  Allergies: Patient has no known allergies.    Past Medical History:   Diagnosis Date   . Abdominal pain 06/2016   . Abnormal vision     Wear glasses   . Arthritis August 2016    right knee   . Cochlear implant in place    . Constipation 06/2016   . Depression 1990   . Disorder of musculoskeletal system 2009   . Fecal incontinence    . Gastric ulceration 07/2016   . Gastroesophageal reflux disease     Controlled with med, states related with NSAID   . Headache 1990    Left side migraines a controlled by pamalor   . 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 History of acoustic neuroma    . Post-operative nausea and vomiting 1980    Has been controlled following during recent procedures                                  Gaynelle Arabian, PT

## 2017-12-12 ENCOUNTER — Inpatient Hospital Stay: Payer: Medicare Other | Attending: Neurology | Admitting: Rehabilitative and Restorative Service Providers"

## 2017-12-12 DIAGNOSIS — R42 Dizziness and giddiness: Secondary | ICD-10-CM

## 2017-12-19 ENCOUNTER — Inpatient Hospital Stay: Payer: Medicare Other | Admitting: Rehabilitative and Restorative Service Providers"

## 2017-12-19 DIAGNOSIS — R42 Dizziness and giddiness: Secondary | ICD-10-CM

## 2017-12-19 NOTE — PT/OT Therapy Note (Signed)
Name: Audrey Ray Age: 67 y.o.   Date of Service: 12/19/2017  Referring Physician: Alphonse Guild, MD   Date of Injury: 11/12/2017  Date Care Plan Established/Reviewed: 11/17/2017  Date Treatment Started: 11/17/2017  End of Certification Date: 02/14/2018  Sessions in Plan of Care: 16  Surgery Date: No data was found    Visit Count: 3   Diagnosis:   1. 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 Bending over to pick up object causes dizziness most of the time.      Precautions: No data was found  Allergies: Patient has no known allergies.                         OUTCOMES IE    FOTO 86    ABC 78%    DHI 18      Dizziness IE 12/12/17 12/19/17    Current 0 0 0   Worst in past week 2 5 4    Best in past week 0 0 0     Special Tests/Neurological Screen:    Test Results IE 12/12/17    Vertebral Artery NT     Spontaneous Nystagmus (fixation present) (-)     Spontaneous Nystagmus (fixation suppressed) (-)     Gaze-evoked Nystagmus (fixation present) (-)     Gaze-evoked Nystagmus (fixation suppressed) (-)     Near [employer] intact     Saccade H intact     Saccade V intact     VOR H intact     VOR V intact     VOR Cancellation intact     Static Visual Acuity and Dynamic Visual Acuity SVA: 10/40  DVA: 10/12.5 (5 line difference)     Modified CTSIB NT    mild difficulty condition 4  See media for details    SLS R: 20  L: 30     Short Form DGI 9/12        IE R IE L   Head Thrust Test (+) (+)   Dix-Hallpike (-) (-)   Roll Test (-) (-)       Treatment     Neuromuscular Re-Education   Justification: Gaze stabilization,habituation, and balance ex for improved stability with ADL's   See flow sheet for details    Cueing (verbal,,visual, and tactile cues) for  proper exercise technique and speed with exercise    Therapeutic Activity   Justification: To improve functional mobility/Education  Had to review exposure/recovery model again as pt was doing HEP too intenesly to cause increased symptoms. Answered questions pt had.          ---      ---   Total Time   Timed Minutes  38 minutes   Total Time  38 minutes  Exercise Flow Sheet    Exercise Specifics 12/12/17 12/19/17             VORx1 H ,V conflict backround  30" x2  PH 1' ea  PH           VORx2   H,V 30" eH  PH - Cone pick up  To table and down            rockerboard   Fb/ss  2x30" ea   PH PH            Tandem walk FW/BW  20'2 ea  PH           remembered targets    1'  PH PH           Eye-head    1'  PH PH             EC on foam normal BOS  30" x2  PH PH             Walk with head turns H, V - 20' x4  Ea  PH  hands on guarding                                                                           Home Exercise Program    remembered target,  Eye/head,   VOR x1 conflict backround            (Initials = supervised exercise by clinician)  Access Code: [email or web address]   URL: [email or web address]   Date: 11/17/2017   Prepared by: Jayme Cloud     Exercises    Standing Gaze Stabilization with Head Rotation - 2 sets - 30 seconds hold - 3x daily - 7x weekly    Standing Gaze Stabilization with Head Nod - 2 sets - 2 reps - 30 seconds hold - 3x daily - 7x weekly    Standing Gaze Stabilization with Head Rotation and Horizontal Arm Movement - 2 sets - 30 seconds hold - 3x daily - 7x weekly    Standing Gaze Stabilization with Head Nod and Vertical Arm Movement - 2 sets - 30 seconds hold - 3x daily - 7x weekly       Assessment    Decreasing symptoms.Much cueing today for proper exercise technique. Hands om  guarding for walk w head turns and pt was unsteady.        Pt deaf R, hearing loss L  Plan   Continue with POC 1x /wk      Goals    Goal 1:  Patient will demonstrate independence in prescribed HEP with proper form, sets and reps for safe discharge to an independent program.  12/12/17 :PH   Sessions:  16   Progression:  progressing      Goal 2:  Patient will decrease DHI to 6%.   Sessions:  16      Goal 3:  Patient will improve short form DGI to 11/12.   Sessions:  16      Goal 4:  Patient will improve DVA to within 3 lines of SVA for bending over to pick up object without vertigo.   Sessions:  16  Karl Bales, PT

## 2017-12-22 ENCOUNTER — Inpatient Hospital Stay: Payer: Medicare Other | Admitting: Rehabilitative and Restorative Service Providers"

## 2017-12-22 DIAGNOSIS — R42 Dizziness and giddiness: Secondary | ICD-10-CM

## 2017-12-22 NOTE — PT/OT Therapy Note (Signed)
Name: Audrey Ray Age: 67 y.o.   Date of Service: 12/22/2017  Referring Physician: Alphonse Guild, MD   Date of Injury: 11/12/2017  Date Care Plan Established/Reviewed: 11/17/2017  Date Treatment Started: 11/17/2017  End of Certification Date: 02/14/2018  Sessions in Plan of Care: 16  Surgery Date: No data was found    Visit Count: 4   Diagnosis:   1. Dizziness                               Subjective     History of Present Illness   Mechanism of injury:  B VH : R acoustic neuroma sx 2015 uncompensated (R UVH); L UVH  History of Present Illness: Audrey Ray reports continues to have less frequent dizziness n but can be increased intensity at times. Bending over to pick up object causes dizziness most of the time. Been walking  down [employer] aisles with little difficulty holding onto cart.      Precautions: No data was found  Allergies: Patient has no known allergies.                         OUTCOMES IE    FOTO 86    ABC 78%    DHI 18      Dizziness IE 12/12/17 12/19/17 12/22/17     Current 0 0 0 0    Worst in past week 2 5 4 5     Best in past week 0 0 0 0      Special Tests/Neurological Screen:    Test Results IE 12/12/17    Vertebral Artery NT     Spontaneous Nystagmus (fixation present) (-)     Spontaneous Nystagmus (fixation suppressed) (-)     Gaze-evoked Nystagmus (fixation present) (-)     Gaze-evoked Nystagmus (fixation suppressed) (-)     Near [employer] intact     Saccade H intact     Saccade V intact     VOR H intact     VOR V intact     VOR Cancellation intact     Static Visual Acuity and Dynamic Visual Acuity SVA: 10/40  DVA: 10/12.5 (5 line difference)     Modified CTSIB NT    mild difficulty condition 4  See media for details    SLS R: 20  L: 30     Short Form DGI 9/12        IE R IE L   Head Thrust Test (+) (+)   Dix-Hallpike (-) (-)   Roll Test (-) (-)       Treatment     Neuromuscular Re-Education   Justification: Gaze stabilization,habituation, and balance  ex for improved stability with ADL's   See flow sheet for details    Cueing (verbal,,visual, and tactile cues) for proper exercise technique and speed with exercise    Therapeutic Activity   Justification: To improve functional mobility/Education  NA          ---      ---   Total Time   Timed Minutes  38 minutes   Total Time  38 minutes  Exercise Flow Sheet    Exercise Specifics 12/12/17 12/19/17 12/22/17            VORx1 H ,V conflict backround  30" x2  PH 1' ea  PH 1' ea  PH          VORx2   H,V 30" e  PH - Cone pick up  To table and down  x2  PH            rockerboard   Fb/ss  2x30" ea   PH PH PH           Tandem walk FW/BW  20'2 ea  PH PH          remembered targets    1'  PH PH PH          Eye-head    1'  PH PH PH            EC on foam normal BOS  30" x2  PH PH PH            Walk with head turns H, V - 20' x4  Ea  PH  hands on guarding 20' x6 ea  PH Hallway  100' x2 ea                                                                         Home Exercise Program    remembered target,  Eye/head,   VOR x1 conflict backround            (Initials = supervised exercise by clinician)      Access Code: [email or web address]   URL: [email or web address]   Date: 12/22/2017   Prepared by: Candida Peeling     Exercises   Standing Gaze Stabilization with Head Rotation - 2 sets - 30 seconds hold - 3x daily - 7x weekly   Standing Gaze Stabilization with Head Nod - 2 sets - 2 reps - 30 seconds hold - 3x daily - 7x weekly   Standing VOR Cancellation - 10 reps - 3 sets - 1x daily - 7x weekly       Assessment    Decreasing  Frequency of symptoms. Less cueing today for proper exercise technique.     Hands on guarding for walk w head turns :pt was  More steady than last visit        Pt deaf R, hearing loss L  Plan   Continue  with POC 1x /wk: next visit: DVA and short form DGI and note to MD    MD appt 01/07/18      Goals    Goal 1:  Patient will demonstrate independence in prescribed HEP with proper form, sets and reps for safe discharge to an independent program.  12/12/17 :PH   Sessions:  16   Progression:  progressing      Goal 2:  Patient will decrease DHI to 6%.   Sessions:  16      Goal 3:  Patient will improve short form DGI to 11/12.   Sessions:  16      Goal 4:  Patient will improve DVA to within 3 lines of SVA for bending over to pick up object without vertigo.  Sessions:  [address]                                Gaynelle Arabian, PT

## 2018-01-02 ENCOUNTER — Inpatient Hospital Stay: Payer: Medicare Other | Attending: Neurology | Admitting: Rehabilitative and Restorative Service Providers"

## 2018-01-02 DIAGNOSIS — R42 Dizziness and giddiness: Secondary | ICD-10-CM | POA: Insufficient documentation

## 2018-01-02 NOTE — Progress Notes (Signed)
Name: Audrey Ray Age: 67 y.o.   Date of Service: 01/02/2018  Referring Physician: Alphonse Guild, MD   Date of Injury: 11/12/2017  Date Care Plan Established/Reviewed: 11/17/2017  Date Treatment Started: 11/17/2017  End of Certification Date: 02/14/2018  Sessions in Plan of Care: 16  Surgery Date: No data was found    Visit Count: 5   Diagnosis:   1. 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 Bending over to pick up object doesn't cause dizziness now.  The exercises no longer make her dizzy. She is training for a marathon now.      Precautions: No data was found  Allergies: Patient has no known allergies.               OUTCOMES IE 01/02/18   FOTO 86 97   ABC 78% 93%   DHI 18 8     Dizziness IE 12/12/17 12/19/17 12/22/17 01/02/18    Current 0 0 0 0 0   Worst in past week 2 5 4 5 7    Best in past week 0 0 0 0 0     Special Tests/Neurological Screen:    Test Results IE 12/12/17 01/03/12   Vertebral Artery NT     Spontaneous Nystagmus (fixation present) (-)     Spontaneous Nystagmus (fixation suppressed) (-)     Gaze-evoked Nystagmus (fixation present) (-)     Gaze-evoked Nystagmus (fixation suppressed) (-)     Near [employer]      Smooth Pursuit intact     Saccade H intact     Saccade V intact     VOR H intact     VOR V intact     VOR Cancellation intact     Static Visual Acuity and Dynamic Visual Acuity SVA: 10/40  DVA: 10/12.5 (5 line difference)  SVA 20/20    DVA 20/80  5 line difference   Modified CTSIB NT    mild difficulty condition 4  See media for details    SLS R: 20  L: 30     Short Form DGI 9/12  12/12      IE R IE L   Head Thrust Test (+) (+)   Dix-Hallpike (-) (-)   Roll Test (-) (-)         Assessment   Significantly less  frequent dizzy overall  but when it occurs it is more intense. Pt is now training for a marathon with no difficulties and feels comfortable with HEP.   Still with 5 line difference in clinical DVA but no more dizziness with bending.  Improved short form DGI to 12/12. DHI improved to 8% just shy of meeting goal.    Pt may be ready for [place] now but would like to discuss with her MD.      Pt deaf R, hearing loss L  Plan   PR sent to MD    MD appt 01/07/18      Goals    Goal 1:  Patient will demonstrate independence in prescribed HEP with proper form, sets  and reps for safe discharge to an independent program.  12/12/17 :PH    01/02/18: PH   Sessions:  16   Progression:  met      Goal 2:  Patient will decrease DHI to 6%.    01/02/18: 8%   Sessions:  16   Progression:  progressing       Goal 3:  Patient will improve short form DGI to 11/12.    01/02/18: 12/12   Sessions:  16   Progression:  met      Goal 4:  Patient will improve DVA to within 3 lines of SVA for bending over to pick up object without vertigo.  01/02/18: 5 line difference DVA but can bend over and pick up an object without dizziness   Sessions:  16   Progression:  progressing                                Gaynelle Arabian, PT

## 2018-01-02 NOTE — PT/OT Therapy Note (Addendum)
Name: Audrey Ray Age: 67 y.o.   Date of Service: 01/02/2018  Referring Physician: Alphonse Guild, MD   Date of Injury: 11/12/2017  Date Care Plan Established/Reviewed: 11/17/2017  Date Treatment Started: 11/17/2017  End of Certification Date: 02/14/2018  Sessions in Plan of Care: 16  Surgery Date: No data was found    Visit Count: 5   Diagnosis:   1. 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 Bending over to pick up object doesn't cause dizziness now.  The exercises no longer make her dizzy. She is training for a marathon now.      Precautions: No data was found  Allergies: Patient has no known allergies.                         OUTCOMES IE 01/02/18   FOTO 86 97   ABC 78% 93%   DHI 18 8     Dizziness IE 12/12/17 12/19/17 12/22/17 01/02/18    Current 0 0 0 0 0   Worst in past week 2 5 4 5 7    Best in past week 0 0 0 0 0     Special Tests/Neurological Screen:    Test Results IE 12/12/17 01/03/12   Vertebral Artery NT     Spontaneous Nystagmus (fixation present) (-)     Spontaneous Nystagmus (fixation suppressed) (-)     Gaze-evoked Nystagmus (fixation present) (-)     Gaze-evoked Nystagmus (fixation suppressed) (-)     Near [employer]      Smooth Pursuit intact     Saccade H intact     Saccade V intact     VOR H intact     VOR V intact     VOR Cancellation intact     Static Visual Acuity and Dynamic Visual Acuity SVA: 10/40  DVA: 10/12.5 (5 line difference)  SVA 20/20    DVA 20/80  5 line difference   Modified CTSIB NT    mild difficulty condition 4  See media for details    SLS R: 20  L: 30     Short Form DGI 9/12  12/12      IE R IE L   Head Thrust Test (+) (+)   Dix-Hallpike (-) (-)   Roll Test (-) (-)       Treatment     Neuromuscular Re-Education    Justification: Gaze stabilization,habituation, and balance ex for improved stability with ADL's   See flow sheet for details    Cueing (verbal,,visual, and tactile cues) for proper exercise technique and speed with exercise    Therapeutic Activity   Justification: To improve functional mobility/Education  Discussed test results and told her to continue with ex until dizziness all gone. She will discuss with MD whether to stop therapy now or continue a few more times. Pt feels comfortable with HEP.          ---      ---   Total Time   Timed Minutes  43 minutes   Total Time  43 minutes                                                         Exercise Flow Sheet    Exercise Specifics 12/12/17 12/19/17 12/22/17 01/02/18           VORx1 H ,V conflict backround  30" x2  PH 1' ea  PH 1' ea  PH 1' ea  PH         VORx2   H,V 30" e  PH - Cone pick up  To table and down  x2  PH   PH         rockerboard   Fb/ss  2x30" ea   PH PH PH PH          Tandem walk FW/BW  20'2 ea  PH PH PH         remembered targets    1'  PH PH PH -         Eye-head    1'  PH PH PH -           EC on foam normal BOS  30" x2  PH PH PH PH           Walk with head turns H, V - 20' x4  Ea  PH  hands on guarding 20' x6 ea  PH Hallway  100' x2 ea  [facility]                                                                         Home Exercise Program    remembered target,  Eye/head,   VOR x1 conflict backround            (Initials = supervised exercise by clinician)      Access Code: [email or web address]   URL: [email or web address]   Date: 12/22/2017   Prepared by: Candida Peeling     Exercises   Standing Gaze Stabilization with Head Rotation - 2 sets - 30 seconds hold - 3x daily - 7x weekly   Standing Gaze Stabilization with Head Nod - 2 sets - 2 reps - 30 seconds hold - 3x daily - 7x weekly   Standing VOR  Cancellation - 10 reps - 3 sets - 1x daily - 7x weekly       Assessment   Significantly less  frequent dizzy overall but when it occurs it is more intense. Pt is now training for a marathon with no difficulties and feels comfortable with HEP.   Still with 5 line difference in clinical DVA but no more dizziness with bending.  Improved short form DGI to 12/12. DHI improved to 8% just shy of meeting goal.    LOB x2 with EC on foam balance    Pt may be ready for [place] now but would like to discuss with her MD.          Pt deaf R, hearing loss L  Plan   PR sent to MD    MD appt 01/07/18  Goals    Goal 1:  Patient will demonstrate independence in prescribed HEP with proper form, sets and reps for safe discharge to an independent program.  12/12/17 :PH    01/02/18: PH   Sessions:  16   Progression:  met      Goal 2:  Patient will decrease DHI to 6%.    01/02/18: 8%   Sessions:  16   Progression:  progressing       Goal 3:  Patient will improve short form DGI to 11/12.    01/02/18: 12/12   Sessions:  16   Progression:  met      Goal 4:  Patient will improve DVA to within 3 lines of SVA for bending over to pick up object without vertigo.  01/02/18: 5 line difference DVA but can bend over and pick up an object without dizziness   Sessions:  16   Progression:  progressing                                Gaynelle Arabian, PT    01/07/18: pt called today and left message with front office saying her MD states she no longer needs therapy. She is cancelling all of her remaining appts.    Harvie Bridge Luz Brazen PT, MS, OCS [phone]

## 2018-01-03 ENCOUNTER — Emergency Department
Admission: EM | Admit: 2018-01-03 | Discharge: 2018-01-03 | Disposition: A | Discharge status: Home / Self Care | Payer: Medicare Other | Attending: Emergency Medicine | Admitting: Emergency Medicine

## 2018-01-03 ENCOUNTER — Emergency Department: Payer: Medicare Other

## 2018-01-03 DIAGNOSIS — S0181XA Laceration without foreign body of other part of head, initial encounter: Secondary | ICD-10-CM | POA: Insufficient documentation

## 2018-01-03 DIAGNOSIS — S60512A Abrasion of left hand, initial encounter: Secondary | ICD-10-CM | POA: Insufficient documentation

## 2018-01-03 DIAGNOSIS — S02612A Fracture of condylar process of left mandible, initial encounter for closed fracture: Secondary | ICD-10-CM | POA: Insufficient documentation

## 2018-01-03 DIAGNOSIS — S02642A Fracture of ramus of left mandible, initial encounter for closed fracture: Secondary | ICD-10-CM | POA: Insufficient documentation

## 2018-01-03 DIAGNOSIS — S02609A Fracture of mandible, unspecified, initial encounter for closed fracture: Secondary | ICD-10-CM

## 2018-01-03 DIAGNOSIS — W19XXXA Unspecified fall, initial encounter: Secondary | ICD-10-CM

## 2018-01-03 DIAGNOSIS — Y9301 Activity, walking, marching and hiking: Secondary | ICD-10-CM | POA: Insufficient documentation

## 2018-01-03 DIAGNOSIS — W01198A Fall on same level from slipping, tripping and stumbling with subsequent striking against other object, initial encounter: Secondary | ICD-10-CM | POA: Insufficient documentation

## 2018-01-03 MED ORDER — AMOXICILLIN 400 MG/5ML PO SUSR
400.0000 mg | Freq: Two times a day (BID) | ORAL | 0 refills | Status: AC
Start: 2018-01-03 — End: 2018-01-13

## 2018-01-03 MED ORDER — ACETAMINOPHEN 160 MG/5ML PO SOLN
650.0000 mg | Freq: Once | ORAL | Status: AC
Start: 2018-01-03 — End: 2018-01-03
  Administered 2018-01-03: 15:00:00 650 mg via ORAL
  Filled 2018-01-03: qty 20.3

## 2018-01-03 MED ORDER — HYDROCODONE-ACETAMINOPHEN 7.5-325 MG/15ML PO SOLN
10.0000 mL | Freq: Four times a day (QID) | ORAL | 0 refills | Status: AC | PRN
Start: 2018-01-03 — End: 2018-01-10

## 2018-01-03 MED ORDER — ONDANSETRON 4 MG PO TBDP
4.0000 mg | ORAL_TABLET | Freq: Four times a day (QID) | ORAL | 0 refills | Status: DC | PRN
Start: 2018-01-03 — End: 2021-12-03

## 2018-01-03 NOTE — Discharge Instructions (Signed)
Monitor symptoms for any worsening. Followup with your family doctor as discussed. Take any medications prescribed to you as directed. Return for any other concerns or for worsening. Please read all patient education sheets.     Your laceration has been repaired with sutures/staples, please keep this area clean and dry. Please return in 5-6 days for wound check and suture/staple removal. Watch for any increased redness, pain, swelling, discharge, fevers or other concerns. Take any medications given to you as prescribed. Return sooner for any other concerns or for any worsening.           You were seen today by Garlin Batdorf, PA-C. Thank you for choosing the [facility] for your healthcare needs. We hope your visit today was EXCELLENT.    Follow up with your doctor tomorrow if any symptoms persists.   Please take any medications prescribed as directed.   If you have any questions or concerns, I am available at [phone]. Please do not hesitate to contact me if I can be of assistance.   Below is some information and resources that our patients often find helpful.   Sincerely,   Anastacia Reinecke  [facility]  Department of Emergency Medicine   ________________________________________________________________   Thank you for choosing [facility] for your emergency care needs. We strive to provide EXCELLENT care to you and your family.   IF YOU DO NOT CONTINUE TO IMPROVE OR YOUR CONDITION WORSENS, PLEASE CONTACT YOUR DOCTOR OR RETURN IMMEDIATELY TO THE EMERGENCY DEPARTMENT.   DOCTOR REFERRALS   Call [phone] (available 24 hours a day, 7 days a week) if you need any further referrals and we can help you find a primary care doctor or specialist. Also, available online at: [email or web address]   YOUR CONTACT INFORMATION   Before leaving please check with registration to make sure we have an up-to-date contact number. You can call registration at [phone] to update  your information. For questions about your hospital bill, please call [phone]. For questions about your Emergency Dept Physician bill please call [phone].   FREE HEALTH SERVICES   If you need help with health or social services, please call 2-1-1 for a free referral to resources in your area. 2-1-1 is a free service connecting people with information on health insurance, free clinics, pregnancy, mental health, dental care, food assistance, housing, and substance abuse counseling. Also, available online at: [email or web address].org   MEDICAL RECORDS AND TESTS   Certain laboratory test results do not come back the same day, for example urine cultures. We will contact you if other important findings are noted. Radiology films are often reviewed again to ensure accuracy. If there is any discrepancy, we will notify you.   Please call [phone] to pick up a complimentary CD of any radiology studies performed. If you or your doctor would like to request a copy of your medical records, please call [phone].   ORTHOPEDIC INJURY   Please know that significant injuries can exist even when an initial x-ray is read as normal or negative. This can occur because some fractures (broken bones) are not initially visible on x-rays. For this reason, close outpatient follow-up with your primary care doctor or bone specialist (orthopedist) is required.   MEDICATIONS AND FOLLOWUP   Please be aware that some prescription medications can cause drowsiness. Use caution when driving or operating machinery.   The examination and treatment you have received in our Emergency Department is provided on   an emergency basis, and is not intended to be a substitute for your primary care physician. It is important that your doctor checks you again and that you report any new or remaining problems at that time.   24 [facility] PHARMACIES  [facility] - [address], [place], [place] [address] (1.4 miles, 7 minutes)   [facility] - [address],  [place], [place] [address] (6.5 miles, 13 minutes)   Handout with directions available on request.

## 2018-01-03 NOTE — ED Provider Notes (Signed)
Physician/Midlevel provider first contact with patient: 01/03/18 1142         History     Chief Complaint   Patient presents with   . Facial Injury   . Facial Laceration     Audrey Ray is a 67 y.o. female here for evaluation of ground-level fall.  Patient was walking when she accidentally tripped on the path.  Patient states she hit her chin on the ground had some mild bleeding which was controlled prior to arrival.  Patient states she has some jaw pain.  Denies any other head injury, no other LOC altered mentation visual changes nausea vomiting shortness of breath chest pain or other concerns.  Tetanus up-to-date.  Also has left hand abrasions.  Denies any significant hand or wrist pain             Nursing (triage) note reviewed for the following pertinent information:  Pt with trip and fall on uneven concrete at 10:55am. Laceration to chin and pt states teeth feel non aligned and painful to move jaw. No LOC.    Past Medical History:   Diagnosis Date   . Abdominal pain 06/2016   . Abnormal vision     Wear glasses   . Arthritis August 2016    right knee   . Cochlear implant in place    . Constipation 06/2016   . Depression 1990   . Disorder of musculoskeletal system 2009   . Fecal incontinence    . Gastric ulceration 07/2016   . Gastroesophageal reflux disease     Controlled with med, states related with NSAID   . Headache 1990    Left side migraines a controlled by pamalor   . 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 History of acoustic neuroma    . Post-operative nausea and vomiting 1980    Has been controlled following during recent procedures       Past Surgical History:   Procedure Laterality Date   . ADENOIDECTOMY  1960   . ARTHROSCOPY, KNEE Right 11/29/2014    Procedure: ARTHROSCOPY, KNEE, RIGHT KNEE PARTIAL MENISCECTOMY/SUBCHONDROPLASTY (ORIF TIBIAL PLATEU) ;  Surgeon: Joni Reining, MD;  Location: [facility];  Service: Orthopedics;  Laterality: Right;   RIGHT KNEE PARTIAL MENISCECTOMY/SUBCHONDROPLASTY (ORIF TIBIAL PLATEU)     . CESAREAN SECTION  1995   . Marcy Panning APPROACH  04/14/2013    Procedure: Marcy Panning APPROACH;  Surgeon: Ericka Pontiff, MD;  Location: [facility];  Service: Neurosurgery;  Laterality: Right;  RIGHT TRANSLABYRINTHINE APPROACH FOR VESTIBULAR SCHWANNOMA, ABDOMINAL FAT GRAFT   . EGD N/A 10/11/2016    Procedure: EGD;  Surgeon: Juanetta Snow, MD;  Location: [facility];  Service: Gastroenterology;  Laterality: N/A;  EGD  Q1=N/A   . EGD, COLONOSCOPY N/A 08/02/2016    Procedure: EGD, COLONOSCOPY;  Surgeon: Juanetta Snow, MD;  Location: [facility];  Service: Gastroenterology;  Laterality: N/A;  EGD/COLONOSCOPY  Q1=N   . HYSTERECTOMY  1995   . INSERTION, OSSEOINTEGRATED IMP/TEMPORAL BONE/BAHA  07/05/2013    Procedure: INSERTION, OSSEOINTEGRATED IMP/TEMPORAL BONE/BAHA;  Surgeon: Judie Bonus, MD PHD;  Location: [facility];  Service: ENT;  Laterality: N/A;  RIGHT BAHA   . KNEE ARTHROSCOPY W/ MENISCAL REPAIR Left 1999   . MASTOIDECTOMY, MODIFIED  04/14/2013    Procedure: MASTOIDECTOMY, MODIFIED;  Surgeon: Judie Bonus, MD PHD;  Location: [facility];  Service: ENT;  Laterality: N/A;   . MASTOIDECTOMY, MODIFIED  07/05/2013    Procedure: MASTOIDECTOMY, MODIFIED;  Surgeon: Judie Bonus, MD PHD;  Location: [facility];  Service: ENT;  Laterality: N/A;  RIGHT MIDDLE EAR OBLITERATION W/ OVERSEW OF EAR CANAL,   . MYRINGOPLASTY, FAT GRAFT  07/05/2013    Procedure: MYRINGOPLASTY, FAT GRAFT;  Surgeon: Judie Bonus, MD PHD;  Location: [facility];  Service: ENT;  Laterality: Right;   ABDOMINAL FAT GRAFT,    . TONSILLECTOMY  1960   . TYMPANOPLASTY, EXPLORATION , REMOVAL CHOLESTEATOMA  04/19/2013    Procedure: TYMPANOPLASTY, EXPLORATION , REMOVAL CHOLESTEATOMA;  Surgeon: Judie Bonus, MD PHD;  Location: [facility];  Service: ENT;  Laterality: Right;  Middle Ear Obliteration with fat graft harvest from  abdomen; Control of CSF leak       History reviewed. No pertinent family history.    Social  Social History     Tobacco Use   . Smoking status: Former Smoker     Packs/day: 1.00     Years: 2.00     Pack years: 2.00     Last attempt to quit: 11/10/1978     Years since quitting: 39.1   . Smokeless tobacco: Never Used   Substance Use Topics   . Alcohol use: Yes     Types: 5 Glasses of wine per week     Comment: glass of wine daily   . Drug use: No       .     No Known Allergies    Home Medications     Med List Status:  In Progress Set By: Lennox Pippins, RN at 01/03/2018 11:41 AM                calcium carbonate-vitamin D 600-400 MG-UNIT per tablet     Take 1 tablet by mouth 2 (two) times daily.         dexlansoprazole 60 MG capsule     Take 1 capsule (60 mg total) by mouth daily.     levocetirizine (XYZAL) 5 MG tablet     Take 5 mg by mouth every evening.     Multiple Vitamin (MULTIVITAMIN) capsule     Take 1 capsule by mouth daily.     nortriptyline (PAMELOR) 25 MG capsule     Take 50 mg by mouth nightly.         polyethylene glycol (MIRALAX) packet     Take 17 g by mouth daily.     raNITIdine (ZANTAC) 150 MG tablet     Take 300 mg by mouth nightly.         sucralfate (CARAFATE) 1 GM/10ML suspension     Take 1 g by mouth 4 times daily with meals and at bedtime.     zolpidem (AMBIEN CR) 6.25 MG CR tablet     Take 6.25 mg by mouth nightly as needed for Sleep.           Review of Systems   Constitutional: Negative for fever.   HENT: Negative for congestion.    Eyes: Negative for visual disturbance.   Respiratory: Negative for cough, shortness of breath and wheezing.    Cardiovascular: Negative for chest pain.   Genitourinary: Negative for dysuria.   Musculoskeletal: Positive for arthralgias.   Skin: Positive for wound.   Neurological: Negative for headaches.       Physical Exam    BP: 146/85, Heart Rate: 84, Temp:  97.7 F (36.5 C), Resp Rate: 18, SpO2: 96 %, Weight: 61.6 kg    Physical Exam  Vitals signs and nursing  note reviewed.   Constitutional:       Appearance: Normal appearance.   HENT:      Head: Normocephalic.        Right Ear: Tympanic membrane and external ear normal.      Left Ear: Tympanic membrane and external ear normal.      Nose: Nose normal.      Mouth/Throat:      Mouth: Mucous membranes are moist.   Eyes:      Extraocular Movements: Extraocular movements intact.      Pupils: Pupils are equal, round, and reactive to light.   Neck:      Musculoskeletal: Normal range of motion.   Cardiovascular:      Rate and Rhythm: Normal rate.   Pulmonary:      Effort: Pulmonary effort is normal. No respiratory distress.      Breath sounds: Normal breath sounds.   Musculoskeletal:         General: Tenderness present.      Comments: No tenderness to palpation to left hand, abrasions noted, full sensation, FROM  No neck pain to palpation   Neurological:      General: No focal deficit present.      Mental Status: She is alert. Mental status is at baseline. She is disoriented.      Cranial Nerves: No cranial nerve deficit.      Sensory: No sensory deficit.      Motor: No weakness.      Comments: No facial droop, no slurred speech, no pronator drift, normal finger-to-nose           MDM and ED Course     ED Medication Orders (From admission, onward)    Start Ordered     Status Ordering Provider    01/03/18 1524 01/03/18 1524  acetaminophen (TYLENOL) 160 MG/5ML oral solution 650 mg  Once     Route: Oral  Ordered Dose: 650 mg     Last MAR action:  Given Claryce Friel             MDM  Number of Diagnoses or Management Options  Chin laceration, initial encounter:   Fall, initial encounter:   Jaw fracture, closed, initial encounter:   Diagnosis management comments: Patient here for evaluation of mechanical ground-level trip and fall prior to arrival.  Patient hit her chin, mild bleeding on evaluation.  Patient states that her jaw hurts.  Denies any other head injury, LOC, altered mentation, visual changes or other concerns.  No focal  neuro deficits on exam.  She laceration sutured.  Imaging obtained shows concern for 2 left-sided mandibular fractures.  Discussed case with oral surgery on-call.  Advised on pain control antibiotics close follow-up on Monday and return to the ED for any concerns worsening.  This was relayed to the patient understands.  Advised on liquid and/or soft food diet                   Lac Repair  Date/Time: 01/03/2018 4:11 PM  Performed by: Nettie Elm, PA  Authorized by: Isidoro Donning, MD     Consent:     Consent obtained:  Verbal    Consent given by:  Patient    Alternatives discussed:  No treatment  Anesthesia (see MAR for exact dosages):     Anesthesia method:  Local infiltration  Local anesthetic:  Lidocaine 1% WITH epi  Laceration details:     Location: Chin.    Length (cm):  1    Depth (mm):  1  Repair type:     Repair type:  Simple  Pre-procedure details:     Preparation:  Patient was prepped and draped in usual sterile fashion  Exploration:     Contaminated: no    Treatment:     Area cleansed with:  Saline and Shur-Clens    Amount of cleaning:  Standard    Irrigation solution:  Sterile saline    Visualized foreign bodies/material removed: no    Skin repair:     Repair method:  Sutures    Suture size:  6-0    Suture material:  Nylon    Suture technique:  Simple interrupted    Number of sutures:  2  Approximation:     Approximation:  Close  Post-procedure details:     Dressing:  Antibiotic ointment    Patient tolerance of procedure:  Tolerated well, no immediate complications        Clinical Impression & Disposition     Clinical Impression  Final diagnoses:   Jaw fracture, closed, initial encounter   Fall, initial encounter   Chin laceration, initial encounter        ED Disposition     ED Disposition Condition Date/Time Comment    Discharge  Sat Jan 03, 2018  3:26 PM Zollie Scale discharge to home/self care.    Condition at disposition: Stable           Discharge Medication List as of 01/03/2018  3:26 PM       START taking these medications    Details   amoxicillin (AMOXIL) 400 MG/5ML suspension Take 5 mLs (400 mg total) by mouth 2 (two) times daily for 10 days, Starting Sat 01/03/2018, Until Tue 01/13/2018, Print      HYDROcodone-acetaminophen (HYCET) 7.5-325 MG/15ML solution Take 10 mLs by mouth every 6 (six) hours as needed for Pain 0.1 mg/kg/dose of hydrocodone component, Starting Sat 01/03/2018, Until Sat 01/10/2018, Print      ondansetron (ZOFRAN-ODT) 4 MG disintegrating tablet Take 1 tablet (4 mg total) by mouth every 6 (six) hours as needed for Nausea, Starting Sat 01/03/2018, Print                       [place], [place], [place]  01/03/18 1613       Isidoro Donning, MD  01/03/18 2013

## 2018-01-07 ENCOUNTER — Emergency Department
Admission: EM | Admit: 2018-01-07 | Discharge: 2018-01-07 | Disposition: A | Discharge status: Home / Self Care | Payer: Medicare Other | Attending: Emergency Medical Services | Admitting: Emergency Medical Services

## 2018-01-07 DIAGNOSIS — Z4802 Encounter for removal of sutures: Secondary | ICD-10-CM | POA: Insufficient documentation

## 2018-01-16 ENCOUNTER — Inpatient Hospital Stay: Payer: Medicare Other | Admitting: Rehabilitative and Restorative Service Providers"

## 2018-01-19 ENCOUNTER — Inpatient Hospital Stay: Payer: Medicare Other | Admitting: Rehabilitative and Restorative Service Providers"

## 2018-02-02 ENCOUNTER — Inpatient Hospital Stay: Payer: Medicare Other | Admitting: Rehabilitative and Restorative Service Providers"

## 2018-02-12 ENCOUNTER — Inpatient Hospital Stay: Payer: Medicare Other | Admitting: Rehabilitative and Restorative Service Providers"

## 2018-02-16 ENCOUNTER — Inpatient Hospital Stay: Payer: Medicare Other | Admitting: Rehabilitative and Restorative Service Providers"

## 2018-10-23 ENCOUNTER — Encounter: Payer: Self-pay | Admitting: Gastroenterology

## 2018-11-23 ENCOUNTER — Ambulatory Visit: Payer: Medicare Other

## 2018-11-23 NOTE — Pre-Procedure Instructions (Signed)
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 LR    -Other information:   COVID19 screening and visitor restriction guidelines reviewed with patient.    -Chart room handoff for further follow-up:   COVID19

## 2018-11-28 ENCOUNTER — Ambulatory Visit (FREE_STANDING_LABORATORY_FACILITY): Payer: Medicare Other

## 2018-11-28 DIAGNOSIS — Z01818 Encounter for other preprocedural examination: Secondary | ICD-10-CM

## 2018-11-28 DIAGNOSIS — Z1159 Encounter for screening for other viral diseases: Secondary | ICD-10-CM

## 2018-11-29 LAB — COVID-19 (SARS-COV-2): SARS CoV 2 Overall Result: NOT DETECTED

## 2018-12-01 ENCOUNTER — Ambulatory Visit: Payer: Medicare Other | Admitting: Certified Registered"

## 2018-12-01 ENCOUNTER — Ambulatory Visit
Admission: RE | Admit: 2018-12-01 | Discharge: 2018-12-01 | Disposition: A | Discharge status: Home / Self Care | Payer: Medicare Other | Source: Ambulatory Visit | Attending: Gastroenterology | Admitting: Gastroenterology

## 2018-12-01 ENCOUNTER — Ambulatory Visit: Payer: Self-pay

## 2018-12-01 ENCOUNTER — Encounter
Admission: RE | Disposition: A | Discharge status: Home / Self Care | Payer: Self-pay | Source: Ambulatory Visit | Attending: Gastroenterology

## 2018-12-01 DIAGNOSIS — K2950 Unspecified chronic gastritis without bleeding: Secondary | ICD-10-CM | POA: Insufficient documentation

## 2018-12-01 DIAGNOSIS — K219 Gastro-esophageal reflux disease without esophagitis: Secondary | ICD-10-CM | POA: Insufficient documentation

## 2018-12-01 DIAGNOSIS — R1013 Epigastric pain: Secondary | ICD-10-CM

## 2018-12-01 DIAGNOSIS — H9191 Unspecified hearing loss, right ear: Secondary | ICD-10-CM | POA: Insufficient documentation

## 2018-12-01 DIAGNOSIS — K228 Other specified diseases of esophagus: Secondary | ICD-10-CM | POA: Insufficient documentation

## 2018-12-01 DIAGNOSIS — K3189 Other diseases of stomach and duodenum: Secondary | ICD-10-CM | POA: Insufficient documentation

## 2018-12-01 HISTORY — PX: EGD, 48 HR PH MONITOR, INSERTION: SHX3792

## 2018-12-01 HISTORY — PX: 24, 48, OR 96 HOUR AMBULATORY ESOPHAGEAL PH MONITORING (BRAVO): SHX3792

## 2018-12-01 SURGERY — 24, 48, OR 96 HOUR AMBULATORY ESOPHAGEAL PH MONITORING (BRAVO)
Anesthesia: Anesthesia General | Site: Esophagus

## 2018-12-01 MED ORDER — ONDANSETRON HCL 4 MG/2ML IJ SOLN
INTRAMUSCULAR | Status: DC | PRN
Start: 2018-12-01 — End: 2018-12-01
  Administered 2018-12-01: 4 mg via INTRAVENOUS

## 2018-12-01 MED ORDER — LABETALOL HCL 5 MG/ML IV SOLN (WRAP)
INTRAVENOUS | Status: DC | PRN
Start: 2018-12-01 — End: 2018-12-01
  Administered 2018-12-01: 10 mg via INTRAVENOUS

## 2018-12-01 MED ORDER — PROPOFOL INFUSION 10 MG/ML
INTRAVENOUS | Status: DC | PRN
Start: 2018-12-01 — End: 2018-12-01
  Administered 2018-12-01: 50 mg via INTRAVENOUS
  Administered 2018-12-01: 30 mg via INTRAVENOUS

## 2018-12-01 MED ORDER — FENTANYL CITRATE (PF) 50 MCG/ML IJ SOLN (WRAP)
INTRAMUSCULAR | Status: DC | PRN
Start: 2018-12-01 — End: 2018-12-01
  Administered 2018-12-01: 50 ug via INTRAVENOUS

## 2018-12-01 MED ORDER — LACTATED RINGERS IV SOLN
INTRAVENOUS | Status: DC
Start: 2018-12-01 — End: 2018-12-01

## 2018-12-01 MED ORDER — FENTANYL CITRATE (PF) 50 MCG/ML IJ SOLN (WRAP)
INTRAMUSCULAR | Status: AC
Start: 2018-12-01 — End: ?
  Filled 2018-12-01: qty 2

## 2018-12-01 MED ORDER — LIDOCAINE HCL 2 % IJ SOLN
INTRAMUSCULAR | Status: DC | PRN
Start: 2018-12-01 — End: 2018-12-01
  Administered 2018-12-01: 50 mg via INTRAVENOUS

## 2018-12-01 MED ORDER — DEXAMETHASONE SODIUM PHOSPHATE 4 MG/ML IJ SOLN (WRAP)
INTRAMUSCULAR | Status: DC | PRN
Start: 2018-12-01 — End: 2018-12-01
  Administered 2018-12-01: 4 mg via INTRAVENOUS

## 2018-12-01 SURGICAL SUPPLY — 18 items
BLOCK BITE MAXI 60FR LF STRD STRAP SDPRT (Procedure Accessories) ×1
BLOCK BITE OD60 FR STURDY STRAP SIDEPORT (Procedure Accessories) ×1
BLOCK BITE OD60 FR STURDY STRAP SIDEPORT DENTAL RETENTION RIM MAXI (Procedure Accessories) ×1 IMPLANT
CAPSULE BRAVO PILL (Endoscopic Supplies) ×2 IMPLANT
FORCEPS BIOPSY L240 CM LARGE CAPACITY (Procedure Accessories) ×1
FORCEPS BIOPSY L240 CM LARGE CAPACITY MICROMESH TEETH STREAMLINE (Procedure Accessories) IMPLANT
FORCEPS BX SS LG CPC RJ 4 2.4MM 240CM (Procedure Accessories) ×1
GLOVE EXAM LARGE NITRILE CHEMOTHERAPY POWDER FREE SENSE OATMEAL (Glove) ×1 IMPLANT
GLOVE EXAM LARGE NITRILE POWDER FREE SENSE OATMEAL (Glove) ×1 IMPLANT
GLOVE EXAM NITRILE RESTORE LG (Glove) ×1
GLV EXAM NITRILE RESTORE LG (Glove) ×2
KIT UNIVERSAL IRRIGATION SOL (Kits) ×2 IMPLANT
SPONGE GAUZE L4 IN X W4 IN 16 PLY (Dressing) ×1
SPONGE GAUZE L4 IN X W4 IN 16 PLY MAXIMUM ABSORBENT USP TYPE VII (Dressing) ×1 IMPLANT
SPONGE GZE CTTN CRTY 4X4IN LF NS 16 PLY (Dressing) ×1
WATER STERILE PLASTIC POUR BOTTLE 1000 (Irrigation Solutions) ×1
WATER STERILE PLASTIC POUR BOTTLE 1000 ML (Irrigation Solutions) ×1 IMPLANT
WATER STRL 1000ML LF PLS PR BTL (Irrigation Solutions) ×1

## 2018-12-01 NOTE — Anesthesia Preprocedure Evaluation (Signed)
Anesthesia Evaluation    AIRWAY    Mallampati: II    TM distance: >3 FB  Neck ROM: full  Mouth Opening:full  Planned to use difficult airway equipment: No CARDIOVASCULAR    cardiovascular exam normal, regular and normal       DENTAL         PULMONARY    pulmonary exam normal     OTHER FINDINGS                  Relevant Problems   No relevant active problems       PSS Anesthesia Comments: Hx of acustic neuroma, Postop nausea/ vomiting, , GERD,  STOP BANG=1, BMI  19.89        Anesthesia Plan    ASA 2     general                     intravenous induction   Detailed anesthesia plan: general IV  Monitors/Adjuncts: other    Post Op: other  Trial extubation is not planned.      informed consent obtained    Plan discussed with CRNA.                   Signed by: Juanell Fairly 12/01/18 1:48 PM

## 2018-12-01 NOTE — Discharge Instr - AVS First Page (Signed)
Instructions for after your discharge:  Endoscopy Discharge Instructions  General Instructions:  1. Following sedation, your judgement, perception, and coordination are considered impaired. Even though you may feel awake and alert, you are considered legally intoxicated. Therefore, until the next morning;   Do not Drive   Do not operate appliances or equipment that requires reaction time (e.g. stove, electrical tools, machinery)   Do not sign legal documents or be involved in important decisions.   Do not smoke if alone   Do not drink alcoholic beverages   Go directly home and rest for several hours before resuming your routine activities.   It is highly recommended to have a responsible adult stay with you for the next 24 hours    2. Tenderness, swelling or pain may occur at the IV site where you received sedation. If you experience this, apply warm soaks to the area. Notify your physician if this persists.    Instructions Specific To Procedures - Report To Physician Any Of The Following:    Upper Endoscopy, ERCP, Dilations   1. Pain in Chest   2. Nausea/vomitting   3. Fevers/Chills within 24 hours after procedure. Temp>101deg F   4. Severe and persistent abdominal pain and bloating     In Addition:   Mild throat soreness may follow this procedure. Warm salt water gargling or    lozenges of your choice will most likely relieve your discomfort or cold drinks and   popsicles.       Additional Discharge Instructions  Your diet after the procedure: First meal should be small, lite, and bland; nothing spicy, fried, or greasy.  Special Instructions: Follow MD instructions on procedural report.  No foods or drinks containing red dye/color for next 24 hours.      If you have questions or problems contact your MD immediately. If you need immediate attention, call your MD, 911 and/or go to nearest emergency room.

## 2018-12-01 NOTE — Transfer of Care (Signed)
Anesthesia Transfer of Care Note    Patient: Audrey Ray    Procedures performed: Procedure(s) with comments:  EGD, 48 HR PH MONITOR, INSERTION - egd w/ bravo  q1-unk, md req 30 min    Anesthesia type: General TIVA    Patient location:Phase II PACU    Last vitals:   Vitals:    12/01/18 1357   BP: 151/82   Pulse: 79   Resp: 16   Temp: 37.1 C (98.8 F)   SpO2: 100%       Post pain: Patient not complaining of pain, continue current therapy      Mental Status:sedated    Respiratory Function: tolerating room air    Cardiovascular: stable    Nausea/Vomiting: patient not complaining of nausea or vomiting    Hydration Status: adequate    Post assessment: no apparent anesthetic complications    Signed by: Micki Riley  12/01/18 3:58 PM

## 2018-12-01 NOTE — H&P (Signed)
GI PRE PROCEDURE NOTE    Proceduralist Comments:   Review of Systems and Past Medical / Surgical History performed: Yes     Indications:GERD     Previous Adverse Reaction to Anesthesia or Sedation (if yes, describe): No    Physical Exam / Laboratory Data (If applicable)   General: Alert and cooperative  Lungs: Lungs clear to auscultation  Cardiac: RRR, normal S1S2.    Abdomen: Soft, non tender. Normal active bowel sounds  Other:     No labs drawn    American Society of Anesthesiologists (ASA) Physical Status Classification:   Anesthesia ASA Score: 2          Planned Sedation:   Deep sedation with anesthesia    Attestation:   Logann Whitebread has been reassessed immediately prior to the procedure and is an appropriate candidate for the planned sedation and procedure. Risks, benefits and alternatives to the planned procedure and sedation have been explained to the patient or guardian:  yes        Signed by: Alisia Ferrari

## 2018-12-01 NOTE — Anesthesia Postprocedure Evaluation (Signed)
Anesthesia Post Evaluation    Patient: Audrey Ray    Procedure(s) with comments:  EGD, 48 HR PH MONITOR, INSERTION - egd w/ bravo  q1-unk, md req 30 min    Anesthesia type: general    Last Vitals:   Vitals Value Taken Time   BP 187/92 12/01/2018  4:20 PM   Temp  12/01/2018  4:27 PM   Pulse 78 12/01/2018  4:20 PM   Resp 18 12/01/2018  4:20 PM   SpO2 99 % 12/01/2018  4:20 PM                 Anesthesia Post Evaluation:     Patient Evaluated: PACU  Patient Participation: complete - patient participated  Level of Consciousness: awake  Pain Score: 0  Pain Management: adequate    Airway Patency: patent    Anesthetic complications: No      PONV Status: none    Cardiovascular status: stable  Respiratory status: room air  Hydration status: stable        Signed by: Juanell Fairly, 12/01/2018 4:27 PM

## 2018-12-01 NOTE — Addendum Note (Signed)
Addendum  created 12/01/18 1653 by Juanell Fairly, MD    Intraprocedure Meds edited

## 2018-12-02 ENCOUNTER — Encounter: Payer: Self-pay | Admitting: Gastroenterology

## 2018-12-02 ENCOUNTER — Ambulatory Visit (INDEPENDENT_AMBULATORY_CARE_PROVIDER_SITE_OTHER): Payer: Medicare Other | Admitting: Nurse Practitioner

## 2018-12-02 ENCOUNTER — Other Ambulatory Visit: Payer: Self-pay | Admitting: *Deleted

## 2018-12-02 ENCOUNTER — Other Ambulatory Visit: Payer: Self-pay

## 2018-12-02 ENCOUNTER — Encounter: Payer: Self-pay | Admitting: *Deleted

## 2018-12-02 ENCOUNTER — Encounter: Payer: Self-pay | Admitting: Nurse Practitioner

## 2018-12-02 DIAGNOSIS — Z86010 Personal history of colonic polyps: Secondary | ICD-10-CM

## 2018-12-02 NOTE — Assessment & Plan Note (Signed)
Last colonoscopy 11/13/2015 which found a total of 9 colon polyps.  8 of these polyps were tubular adenoma.  Recommended 3-year repeat exam.  She is currently due.  She does not have COPD but it continues to be well managed.  Wears oxygen and CPAP at night.  I have asked her to bring CPAP settings with her to endoscopy.  No other overt GI complaints.  GERD well managed.  We will proceed with colonoscopy at this time.  Proceed with colonoscopy with Dr. Oneida Alar in the near future. The risks, benefits, and alternatives have been discussed in detail with the patient. They state understanding and desire to proceed.   The patient is currently on Zoloft.  No other anticoagulants, anxiolytics, chronic pain medications, or antidepressants.  She was previously on the same dose of Zoloft and had her procedure completed with conscious sedation without sedation issue.  No other anticoagulants, anxiolytics, chronic pain medications, antidepressants, antidiabetics, or iron supplements.  We will proceed with the procedure on conscious sedation which should be adequate for her procedure as it was for her last.

## 2018-12-02 NOTE — Progress Notes (Addendum)
REVIEWED-NO ADDITIONAL RECOMMENDATIONS.  Referring Provider: Talmage Coin, MD Primary Care Physician:  Moshe Cipro, MD Primary GI:  Dr. Oneida Alar  Chief Complaint  Patient presents with   Colonoscopy    due for 3 yr tcs    HPI:   Paula [name] is a 68 y.o. female who presents to schedule III year repeat colonoscopy.  Patient was last seen in our office 10/31/2015 for COPD and encounter for screening colonoscopy.  History of sleep apnea, COPD on oxygen at home.  Uses oxygen about 7 hours a day.  At her last visit she was doing better with injectable COPD medications and oxygen levels much better.  Still uses oxygen attached to CPAP at night.  No previous colonoscopy at that time.  Bowel habits vary with dietary intake.  No other GI complaints.  Recommended colonoscopy.  Colonoscopy was completed 11/13/2015 which found two 2 to 4 mm sessile polyps in the transverse colon.  An additional seven sessile polyps were in the sigmoid, ascending, transverse, descending colon ranging from 6 to 12 mm in size.  Status post carbon black tattooing and resection assisted with epinephrine solution to lift the lesion prior to destruction.  Bleeding prevention by placement of an MR conditional hemostasis clip.  Moderate diverticulosis in the sigmoid colon, descending colon, transverse colon.  Nonbleeding internal hemorrhoids.  Surgical pathology found the polyps to be mostly tubular adenoma with some hyperplastic polyps.  Recommended 3-year repeat colonoscopy, Preparation H or Anusol for hemorrhoids.  Primary relatives should have first colonoscopy at age 70.  Today she states she's doing ok overall. Still doing well with COPD; humidity makes it worse. Wears O2 at night with CPAP. Last saw pulmonary a couple months ago. GERD doing well on Prilosec with occasional/rare breakthrough (occurs with acidic foods). Having regular bowel movements. Denies any abdominal pain, N/V, hematochezia, melena, fever, chills,  unintentional weight loss. Denies URI or flu-like symptoms. Denies loss of sense of taste or smell. Denies chest pain, dyspnea, dizziness, lightheadedness, syncope, near syncope. Denies any other upper or lower GI symptoms.  Past Medical History:  Diagnosis Date   Anxiety    Arthritis    COPD (chronic obstructive pulmonary disease) ([place])    Hypertension    Lung nodule    LLL   Tubal ectopic pregnancy 1970    Past Surgical History:  Procedure Laterality Date   [place]   gland removed   COLONOSCOPY N/A 11/13/2015   Procedure: COLONOSCOPY;  Surgeon: Danie Binder, MD;  Location: [facility];  Service: Endoscopy;  Laterality: N/A;  1030   ECTOPIC PREGNANCY SURGERY  1978    Current Outpatient Medications  Medication Sig Dispense Refill   albuterol (PROVENTIL) (2.5 MG/3ML) 0.083% nebulizer solution Take 2.5 mg by nebulization every 6 (six) hours as needed for wheezing or shortness of breath.     albuterol (VENTOLIN HFA) 108 (90 Base) MCG/ACT inhaler Inhale into the lungs as needed for wheezing or shortness of breath.     arformoterol (BROVANA) 15 MCG/2ML NEBU Take 15 mcg by nebulization 2 (two) times daily.     azelastine (ASTELIN) 0.1 % nasal spray Place into both nostrils 2 (two) times daily. Use in each nostril as directed     betamethasone dipropionate (DIPROLENE) 0.05 % cream Apply topically 2 (two) times daily.     budesonide-formoterol (SYMBICORT) 160-4.5 MCG/ACT inhaler Inhale 2 puffs into the lungs 2 (two) times daily.     Cholecalciferol (  VITAMIN D) 125 MCG (5000 UT) CAPS Take by mouth daily.     diclofenac (VOLTAREN) 75 MG EC tablet Take 75 mg by mouth as needed.      ezetimibe (ZETIA) 10 MG tablet Take 10 mg by mouth daily.     fluticasone (VERAMYST) 27.5 MCG/SPRAY nasal spray Place 2 sprays into the nose daily.     hydrochlorothiazide (HYDRODIURIL) 25 MG tablet Take 1 tablet by mouth daily.     hydrocortisone  (ANUSOL-HC) 2.5 % rectal cream Place 1 application rectally 2 (two) times daily. USE FOR 10 DAYS 30 g 1   ketoconazole (NIZORAL) 2 % cream Apply 1 application topically 2 (two) times daily as needed for irritation.     losartan (COZAAR) 50 MG tablet Take 1 tablet by mouth 2 (two) times daily.     montelukast (SINGULAIR) 10 MG tablet Take 10 mg by mouth at bedtime.     Multiple Vitamins-Minerals (MULTIVITAMIN ADULTS 50+) TABS Take 1 tablet by mouth daily.     NON FORMULARY CPAP, O2 at night     omalizumab (XOLAIR) 150 MG injection Inject 225 mg into the skin every 14 (fourteen) days.     omeprazole (PRILOSEC) 40 MG capsule Take 40 mg by mouth daily.     sertraline (ZOLOFT) 100 MG tablet Take 100 mg by mouth daily.     No current facility-administered medications for this visit.     Allergies as of 12/02/2018 - Review Complete 12/02/2018  Allergen Reaction Noted   Codeine Nausea Only 08/30/2014    Family History  Problem Relation Age of Onset   Colon cancer Neg Hx     Social History   Socioeconomic History   Marital status: Married    Spouse name: Not on file   Number of children: Not on file   Years of education: Not on file   Highest education level: Not on file  Occupational History   Not on file  Social Needs   Financial resource strain: Not on file   Food insecurity    Worry: Not on file    Inability: Not on file   Transportation needs    Medical: Not on file    Non-medical: Not on file  Tobacco Use   Smoking status: Former Smoker    Packs/day: 0.75    Years: 20.00    Pack years: 15.00    Types: Cigarettes   Smokeless tobacco: Former Systems developer    Quit date: 10/31/1998  Substance and Sexual Activity   Alcohol use: Yes    Comment: glass of wine 4-5 times weekly   Drug use: No   Sexual activity: Not on file  Lifestyle   Physical activity    Days per week: Not on file    Minutes per session: Not on file   Stress: Not on file  Relationships     Social connections    Talks on phone: Not on file    Gets together: Not on file    Attends religious service: Not on file    Active member of club or organization: Not on file    Attends meetings of clubs or organizations: Not on file    Relationship status: Not on file  Other Topics Concern   Not on file  Social History Narrative   Not on file    Review of Systems: Complete ROS negative except as per HPI.   Physical Exam: BP (!) 154/77    Pulse 76    Temp (!)  96.8 F (36 C) (Temporal)    Ht 5\' 1"  (1.549 m)    Wt (!) 302 lb 3.2 oz (137.1 kg)    BMI 57.10 kg/m  General:   Alert and oriented. Pleasant and cooperative. Well-nourished and well-developed.  Eyes:  Without icterus, sclera clear and conjunctiva pink.  Ears:  Normal auditory acuity. Cardiovascular:  S1, S2 present without murmurs appreciated. Extremities without clubbing or edema. Respiratory:  Clear to auscultation bilaterally. No wheezes, rales, or rhonchi. No distress.  Gastrointestinal:  +BS, soft, non-tender and non-distended. No HSM noted. No guarding or rebound. No masses appreciated.  Rectal:  Deferred  Musculoskalatal:  Symmetrical without gross deformities. Neurologic:  Alert and oriented x4;  grossly normal neurologically. Psych:  Alert and cooperative. Normal mood and affect. Heme/Lymph/Immune: No excessive bruising noted.    12/02/2018 3:00 PM   Disclaimer: This note was dictated with voice recognition software. Similar sounding words can inadvertently be transcribed and may not be corrected upon review.

## 2018-12-02 NOTE — Patient Instructions (Signed)
Your health issues we discussed today were:   Need for colonoscopy: 1. I am glad you are doing well! 2. We will schedule your colonoscopy for you. 3. As we discussed, bring your CPAP settings with you to endoscopy 4. Further recommendations will follow your colonoscopy  Overall I recommend:  1. Return for follow-up based on post procedure recommendations, or as needed 2. Continue your other current medications 3. Call us if you have any questions or concerns.   Because of recent events of COVID-19 ("Coronavirus"), follow CDC recommendations:  1. Wash your hand frequently 2. Avoid touching your face 3. Stay away from people who are sick 4. If you have symptoms such as fever, cough, shortness of breath then call your healthcare provider for further guidance 5. If you are sick, STAY AT HOME unless otherwise directed by your healthcare provider. 6. Follow directions from state and national officials regarding staying safe   At [facility] we value your feedback. You may receive a survey about your visit today. Please share your experience as we strive to create trusting relationships with our patients to provide genuine, compassionate, quality care.  We appreciate your understanding and patience as we review any laboratory studies, imaging, and other diagnostic tests that are ordered as we care for you. Our office policy is 5 business days for review of these results, and any emergent or urgent results are addressed in a timely manner for your best interest. If you do not hear from our office in 1 week, please contact us.   We also encourage the use of MyChart, which contains your medical information for your review as well. If you are not enrolled in this feature, an access code is on this after visit summary for your convenience. Thank you for allowing [place] to be involved in your care.  It was great to see you today!  I hope you have a great Fall!!

## 2018-12-03 LAB — LAB USE ONLY - HISTORICAL SURGICAL PATHOLOGY

## 2018-12-15 ENCOUNTER — Encounter (INDEPENDENT_AMBULATORY_CARE_PROVIDER_SITE_OTHER): Payer: Self-pay | Admitting: Internal Medicine

## 2018-12-15 ENCOUNTER — Other Ambulatory Visit (INDEPENDENT_AMBULATORY_CARE_PROVIDER_SITE_OTHER): Payer: Self-pay | Admitting: Internal Medicine

## 2018-12-15 DIAGNOSIS — H903 Sensorineural hearing loss, bilateral: Secondary | ICD-10-CM | POA: Insufficient documentation

## 2018-12-15 DIAGNOSIS — G4700 Insomnia, unspecified: Secondary | ICD-10-CM | POA: Insufficient documentation

## 2018-12-15 DIAGNOSIS — J309 Allergic rhinitis, unspecified: Secondary | ICD-10-CM | POA: Insufficient documentation

## 2018-12-15 DIAGNOSIS — N952 Postmenopausal atrophic vaginitis: Secondary | ICD-10-CM | POA: Insufficient documentation

## 2018-12-15 DIAGNOSIS — M1990 Unspecified osteoarthritis, unspecified site: Secondary | ICD-10-CM | POA: Insufficient documentation

## 2018-12-15 DIAGNOSIS — R7401 Elevation of levels of liver transaminase levels: Secondary | ICD-10-CM | POA: Insufficient documentation

## 2018-12-15 DIAGNOSIS — K2100 Gastro-esophageal reflux disease with esophagitis, without bleeding: Secondary | ICD-10-CM | POA: Insufficient documentation

## 2018-12-15 DIAGNOSIS — R9389 Abnormal findings on diagnostic imaging of other specified body structures: Secondary | ICD-10-CM | POA: Insufficient documentation

## 2018-12-15 DIAGNOSIS — M26629 Arthralgia of temporomandibular joint, unspecified side: Secondary | ICD-10-CM | POA: Insufficient documentation

## 2018-12-15 DIAGNOSIS — G43909 Migraine, unspecified, not intractable, without status migrainosus: Secondary | ICD-10-CM | POA: Insufficient documentation

## 2018-12-15 DIAGNOSIS — M8580 Other specified disorders of bone density and structure, unspecified site: Secondary | ICD-10-CM | POA: Insufficient documentation

## 2018-12-28 ENCOUNTER — Encounter (INDEPENDENT_AMBULATORY_CARE_PROVIDER_SITE_OTHER): Payer: Self-pay

## 2019-01-13 ENCOUNTER — Other Ambulatory Visit: Payer: Self-pay | Admitting: Otolaryngology

## 2019-02-11 ENCOUNTER — Telehealth: Payer: Self-pay | Admitting: Gastroenterology

## 2019-02-11 MED ORDER — NA SULFATE-K SULFATE-MG SULF 17.5-3.13-1.6 GM/177ML PO SOLN: 1.0000 | Freq: Once | ORAL | 0 refills | Status: AC

## 2019-02-11 NOTE — Telephone Encounter (Signed)
Rx sent in

## 2019-02-11 NOTE — Telephone Encounter (Signed)
Pt said her prep hadn't been called into [facility] in [place] and she is scheduled with [facility] on 02/15/2019

## 2019-02-12 ENCOUNTER — Other Ambulatory Visit (HOSPITAL_COMMUNITY)
Admission: RE | Admit: 2019-02-12 | Discharge: 2019-02-12 | Disposition: A | Discharge status: Home / Self Care | Payer: Medicare Other | Source: Ambulatory Visit | Attending: Gastroenterology | Admitting: Gastroenterology

## 2019-02-12 ENCOUNTER — Other Ambulatory Visit: Payer: Self-pay

## 2019-02-12 DIAGNOSIS — Z20828 Contact with and (suspected) exposure to other viral communicable diseases: Secondary | ICD-10-CM | POA: Diagnosis not present

## 2019-02-12 DIAGNOSIS — Z01812 Encounter for preprocedural laboratory examination: Secondary | ICD-10-CM | POA: Insufficient documentation

## 2019-02-12 LAB — SARS CORONAVIRUS 2 (TAT 6-24 HRS): SARS Coronavirus 2: NEGATIVE

## 2019-02-15 ENCOUNTER — Other Ambulatory Visit: Payer: Self-pay

## 2019-02-15 ENCOUNTER — Encounter (HOSPITAL_COMMUNITY)
Admission: RE | Disposition: A | Discharge status: Home / Self Care | Payer: Self-pay | Source: Home / Self Care | Attending: Gastroenterology

## 2019-02-15 ENCOUNTER — Ambulatory Visit (HOSPITAL_COMMUNITY)
Admission: RE | Admit: 2019-02-15 | Discharge: 2019-02-15 | Disposition: A | Discharge status: Home / Self Care | Payer: Medicare Other | Attending: Gastroenterology | Admitting: Gastroenterology

## 2019-02-15 ENCOUNTER — Encounter (HOSPITAL_COMMUNITY): Payer: Self-pay | Admitting: *Deleted

## 2019-02-15 DIAGNOSIS — K5730 Diverticulosis of large intestine without perforation or abscess without bleeding: Secondary | ICD-10-CM | POA: Diagnosis not present

## 2019-02-15 DIAGNOSIS — Z87891 Personal history of nicotine dependence: Secondary | ICD-10-CM | POA: Insufficient documentation

## 2019-02-15 DIAGNOSIS — J449 Chronic obstructive pulmonary disease, unspecified: Secondary | ICD-10-CM | POA: Diagnosis not present

## 2019-02-15 DIAGNOSIS — M1990 Unspecified osteoarthritis, unspecified site: Secondary | ICD-10-CM | POA: Diagnosis not present

## 2019-02-15 DIAGNOSIS — G4730 Sleep apnea, unspecified: Secondary | ICD-10-CM | POA: Insufficient documentation

## 2019-02-15 DIAGNOSIS — Z7951 Long term (current) use of inhaled steroids: Secondary | ICD-10-CM | POA: Diagnosis not present

## 2019-02-15 DIAGNOSIS — Z86010 Personal history of colonic polyps: Secondary | ICD-10-CM | POA: Diagnosis not present

## 2019-02-15 DIAGNOSIS — Z79899 Other long term (current) drug therapy: Secondary | ICD-10-CM | POA: Insufficient documentation

## 2019-02-15 DIAGNOSIS — Z1211 Encounter for screening for malignant neoplasm of colon: Secondary | ICD-10-CM | POA: Insufficient documentation

## 2019-02-15 DIAGNOSIS — F419 Anxiety disorder, unspecified: Secondary | ICD-10-CM | POA: Insufficient documentation

## 2019-02-15 DIAGNOSIS — K648 Other hemorrhoids: Secondary | ICD-10-CM | POA: Diagnosis not present

## 2019-02-15 DIAGNOSIS — K644 Residual hemorrhoidal skin tags: Secondary | ICD-10-CM | POA: Insufficient documentation

## 2019-02-15 DIAGNOSIS — I10 Essential (primary) hypertension: Secondary | ICD-10-CM | POA: Diagnosis not present

## 2019-02-15 HISTORY — PX: COLONOSCOPY: SHX5424

## 2019-02-15 HISTORY — DX: Sleep apnea, unspecified: G47.30

## 2019-02-15 SURGERY — COLONOSCOPY
Anesthesia: Moderate Sedation

## 2019-02-15 MED ORDER — MEPERIDINE HCL 100 MG/ML IJ SOLN
INTRAMUSCULAR | Status: DC | PRN
  Administered 2019-02-15: 25 mg via INTRAVENOUS
  Administered 2019-02-15: 50 mg via INTRAVENOUS
  Administered 2019-02-15: 25 mg via INTRAVENOUS

## 2019-02-15 MED ORDER — MEPERIDINE HCL 100 MG/ML IJ SOLN
INTRAMUSCULAR | Status: AC
  Filled 2019-02-15: qty 2

## 2019-02-15 MED ORDER — MIDAZOLAM HCL 5 MG/5ML IJ SOLN
INTRAMUSCULAR | Status: AC
  Filled 2019-02-15: qty 10

## 2019-02-15 MED ORDER — SODIUM CHLORIDE 0.9 % IV SOLN
INTRAVENOUS | Status: DC
  Administered 2019-02-15: 08:00:00 via INTRAVENOUS

## 2019-02-15 MED ORDER — MIDAZOLAM HCL 5 MG/5ML IJ SOLN
INTRAMUSCULAR | Status: DC | PRN
  Administered 2019-02-15 (×2): 2 mg via INTRAVENOUS
  Administered 2019-02-15 (×2): 1 mg via INTRAVENOUS

## 2019-02-15 NOTE — H&P (Signed)
Primary Care Physician:  Moshe Cipro, MD Primary Gastroenterologist:  Dr. Oneida Alar  Pre-Procedure History & Physical: HPI:  BRITTENEY [name] is a 68 y.o. female here for  PERSONAL HISTORY OF POLYPS.  Past Medical History:  Diagnosis Date  . Anxiety   . Arthritis   . COPD (chronic obstructive pulmonary disease) ([place])   . Hypertension   . Lung nodule    LLL  . Sleep apnea   . Tubal ectopic pregnancy 1970    Past Surgical History:  Procedure Laterality Date  . APPENDECTOMY  1957  . BREAST SURGERY  1969   gland removed  . COLONOSCOPY N/A 11/13/2015   Procedure: COLONOSCOPY;  Surgeon: Danie Binder, MD;  Location: [facility];  Service: Endoscopy;  Laterality: N/A;  1030  . ECTOPIC PREGNANCY SURGERY  1978    Prior to Admission medications   Medication Sig Start Date End Date Taking? Authorizing Provider  acetaminophen (TYLENOL) 500 MG tablet Take 1,000 mg by mouth every 6 (six) hours as needed (for pain.).   Yes [provider]  albuterol (VENTOLIN HFA) 108 (90 Base) MCG/ACT inhaler Inhale into the lungs as needed for wheezing or shortness of breath.   Yes [provider]  arformoterol (BROVANA) 15 MCG/2ML NEBU Take 15 mcg by nebulization See admin instructions. Use 1 nebulizer (15 mcg) scheduled with lunch, may use an additional nebulizer in the evening if needed for respiratory issues.   Yes [provider]  azelastine (ASTELIN) 0.1 % nasal spray Place 2 sprays into both nostrils 2 (two) times daily. Use in each nostril as directed    Yes [provider]  betamethasone dipropionate (DIPROLENE) 0.05 % cream Apply 1 application topically 2 (two) times daily as needed (SKIN IRRITATION).    Yes [provider]  budesonide-formoterol (SYMBICORT) 160-4.5 MCG/ACT inhaler Inhale 2 puffs into the lungs 2 (two) times daily.   Yes [provider]  Cholecalciferol (VITAMIN D) 125 MCG (5000 UT) CAPS Take 5,000 Units by mouth daily.    Yes  [provider]  clotrimazole-betamethasone (LOTRISONE) cream Apply 1 application topically 2 (two) times daily as needed (skin irritation.).  11/28/18  Yes [provider]  diclofenac (VOLTAREN) 75 MG EC tablet Take 75 mg by mouth 2 (two) times daily as needed (arthritis pain).    Yes [provider]  ezetimibe (ZETIA) 10 MG tablet Take 10 mg by mouth daily.   Yes [provider]  fluticasone (FLONASE) 50 MCG/ACT nasal spray Place 2 sprays into both nostrils daily.  10/26/18  Yes [provider]  hydrochlorothiazide (HYDRODIURIL) 25 MG tablet Take 25 mg by mouth daily.  11/02/18  Yes [provider]  ketoconazole (NIZORAL) 2 % cream Apply 1 application topically 2 (two) times daily as needed for irritation.   Yes [provider]  losartan (COZAAR) 50 MG tablet Take 100 mg by mouth daily.  11/02/18  Yes [provider]  montelukast (SINGULAIR) 10 MG tablet Take 10 mg by mouth at bedtime.   Yes [provider]  Multiple Vitamin (MULTIVITAMIN WITH MINERALS) TABS tablet Take 1 tablet by mouth daily. One-A-Day   Yes [provider]  omalizumab Arvid Right) 150 MG injection Inject 225 mg into the skin every 14 (fourteen) days.   Yes [provider]  omeprazole (PRILOSEC) 40 MG capsule Take 40 mg by mouth daily.   Yes [provider]  sertraline (ZOLOFT) 100 MG tablet Take 100 mg by mouth every evening.  Yes [provider]  NON FORMULARY CPAP, O2 at night    [provider]    Allergies as of 12/02/2018 - Review Complete 12/02/2018  Allergen Reaction Noted  . Codeine Nausea Only 08/30/2014    Family History  Problem Relation Age of Onset  . Colon cancer Neg Hx     Social History   Socioeconomic History  . Marital status: Married    Spouse name: Not on file  . Number of children: Not on file  . Years of education: Not on file  . Highest education level: Not on file   Occupational History  . Not on file  Social Needs  . Financial resource strain: Not on file  . Food insecurity    Worry: Not on file    Inability: Not on file  . Transportation needs    Medical: Not on file    Non-medical: Not on file  Tobacco Use  . Smoking status: Former Smoker    Packs/day: 0.75    Years: 20.00    Pack years: 15.00    Types: Cigarettes  . Smokeless tobacco: Former Systems developer    Quit date: 10/31/1998  Substance and Sexual Activity  . Alcohol use: Yes    Comment: glass of wine 4-5 times weekly  . Drug use: No  . Sexual activity: Not on file  Lifestyle  . Physical activity    Days per week: Not on file    Minutes per session: Not on file  . Stress: Not on file  Relationships  . Social Herbalist on phone: Not on file    Gets together: Not on file    Attends religious service: Not on file    Active member of club or organization: Not on file    Attends meetings of clubs or organizations: Not on file    Relationship status: Not on file  . Intimate partner violence    Fear of current or ex partner: Not on file    Emotionally abused: Not on file    Physically abused: Not on file    Forced sexual activity: Not on file  Other Topics Concern  . Not on file  Social History Narrative  . Not on file    Review of Systems: See HPI, otherwise negative ROS   Physical Exam: BP (!) 128/50   Pulse 80   Temp 97.7 F (36.5 C) (Oral)   Resp 14   Ht 5\' 1"  (1.549 m)   Wt 136.1 kg   SpO2 95%   BMI 56.68 kg/m  General:   Alert,  pleasant and cooperative in NAD Head:  Normocephalic and atraumatic. Neck:  Supple; Lungs:  Clear throughout to auscultation.    Heart:  Regular rate and rhythm. Abdomen:  Soft, nontender and nondistended. Normal bowel sounds, without guarding, and without rebound.   Neurologic:  Alert and  oriented x4;  grossly normal neurologically.  Impression/Plan:      PERSONAL HISTORY OF POLYPS.  PLAN: 1. TCS TODAY. DISCUSSED  PROCEDURE, BENEFITS, & RISKS: < 1% chance of medication reaction, bleeding, perforation, ASPIRATION, or rupture of spleen/liver requiring surgery to fix it and missed polyps < 1 cm 10-20% of the time.

## 2019-02-15 NOTE — Discharge Instructions (Signed)
You DID NOT HAVE ANY POLYPS. YOU HAVE DIVERTICULOSIS IN YOUR LEFT COLON. You have internal AND EXTERNAL hemorrhoids.  EAT TO LIVE AND THINK OF FOOD AS MEDICINE. 75% OF YOUR PLATE SHOULD BE FRUITS/VEGGIES.  To have more energy, and to lose weight:      1. CONTINUE YOUR WEIGHT LOSS EFFORTS. I RECOMMEND YOU READ AND FOLLOW RECOMMENDATIONS BY DR. MARK HYMAN, "10-DAY DETOX DIET".    2. If you must eat bread, EAT EZEKIEL BREAD. IT IS IN THE FROZEN SECTION OF THE GROCERY STORE.    3. DRINK WATER WITH FRUIT OR CUCUMBER ADDED. YOUR URINE SHOULD BE LIGHT YELLOW. AVOID SODA, GATORADE, ENERGY DRINKS, OR DIET SODA.     4. AVOID HIGH FRUCTOSE CORN SYRUP AND CAFFEINE.     5. DO NOT chew SUGAR FREE GUM OR USE ARTIFICIAL SWEETENERS. IF NEEDED USE STEVIA AS A SWEETENER.    6. DO NOT EAT ENRICHED WHEAT FLOUR, PASTA, RICE, OR CEREAL.    7. ONLY EAT WILD CAUGHT SEAFOOD, GRASS FED BEEF OR CHICKEN, PORK FROM PASTURE RAISE PIGS, OR EGGS FROM PASTURE RAISED CHICKENS.    8. PRACTICE CHAIR YOGA FOR 15-30 MINS 3 OR 4 TIMES A WEEK AND PROGRESS TO HATHA YOGA OVER NEXT 6 MOS.    9. START TAKING A MULTIVITAMIN, VITAMIN B12, AND VITAMIN D3 2000 IU DAILY.   ADDITIONAL SUPPLEMENTS TO DECREASE CRAVING AND SUPPRESS YOUR APPETITE:    1. CINNAMON 500 MG EVERY AM PRIOR TO FIRST MEAL.   **STABILIZES BLOOD GLUCOSE/REDUCES CRAVINGS**    2. CHROMIUM 400-500 MG WITH MEALS TWICE DAILY.    **FAT BURNER**    3. GREEN TEA EXTRACT ONE DAILY.   **FAT BURNER/SUPPRESSES YOUR APPETITE**    4. ALPHA LIPOIC ACID TWICE DAILY.   **NATURAL ANTI-INFLAMMATORY SUPPLEMENT THAT IS AN ALTERNATIVE TO IBUPROFEN OR NAPROXEN**   Next colonoscopy in 5 years.   Colonoscopy Care After Read the instructions outlined below and refer to this sheet in the next week. These discharge instructions provide you with general information on caring for yourself after you leave the hospital. While your treatment has been planned according to the most  current medical practices available, unavoidable complications occasionally occur. If you have any problems or questions after discharge, call DR. Sueko Dimichele, [phone].  ACTIVITY  You may resume your regular activity, but move at a slower pace for the next 24 hours.   Take frequent rest periods for the next 24 hours.   Walking will help get rid of the air and reduce the bloated feeling in your belly (abdomen).   No driving for 24 hours (because of the medicine (anesthesia) used during the test).   You may shower.   Do not sign any important legal documents or operate any machinery for 24 hours (because of the anesthesia used during the test).    NUTRITION  Drink plenty of fluids.   You may resume your normal diet as instructed by your doctor.   Begin with a light meal and progress to your normal diet. Heavy or fried foods are harder to digest and may make you feel sick to your stomach (nauseated).   Avoid alcoholic beverages for 24 hours or as instructed.    MEDICATIONS  You may resume your normal medications.   WHAT YOU CAN EXPECT TODAY  Some feelings of bloating in the abdomen.   Passage of more gas than usual.   Spotting of blood in your stool or on the toilet paper  .  IF YOU  HAD POLYPS REMOVED DURING THE COLONOSCOPY:  Eat a soft diet IF YOU HAVE NAUSEA, BLOATING, ABDOMINAL PAIN, OR VOMITING.    FINDING OUT THE RESULTS OF YOUR TEST Not all test results are available during your visit. DR. Oneida Alar WILL CALL YOU WITHIN 7 DAYS OF YOUR PROCEDUE WITH YOUR RESULTS. Do not assume everything is normal if you have not heard from DR. Undrea Shipes IN ONE WEEK, CALL HER OFFICE AT [phone].  SEEK IMMEDIATE MEDICAL ATTENTION AND CALL THE OFFICE: [phone] IF:  You have more than a spotting of blood in your stool.   Your belly is swollen (abdominal distention).   You are nauseated or vomiting.   You have a temperature over 101F.   You have abdominal pain or discomfort  that is severe or gets worse throughout the day.    High-Fiber Diet A high-fiber diet changes your normal diet to include more whole grains, legumes, fruits, and vegetables. Changes in the diet involve replacing refined carbohydrates with unrefined foods. The calorie level of the diet is essentially unchanged. The Dietary Reference Intake (recommended amount) for adult males is 38 grams per day. For adult females, it is 25 grams per day. Pregnant and lactating women should consume 28 grams of fiber per day. Fiber is the intact part of a plant that is not broken down during digestion. Functional fiber is fiber that has been isolated from the plant to provide a beneficial effect in the body.  PURPOSE  Increase stool bulk.   Ease and regulate bowel movements.   Lower cholesterol.   REDUCE RISK OF COLON CANCER  INDICATIONS THAT YOU NEED MORE FIBER  Constipation and hemorrhoids.   Uncomplicated diverticulosis (intestine condition) and irritable bowel syndrome.   Weight management.   As a protective measure against hardening of the arteries (atherosclerosis), diabetes, and cancer.   GUIDELINES FOR INCREASING FIBER IN THE DIET  Start adding fiber to the diet slowly. A gradual increase of about 5 more grams (2 servings of most fruits or vegetables) per day is best. Too rapid an increase in fiber may result in constipation, flatulence, and bloating.   Drink enough water and fluids to keep your urine clear or pale yellow. Water, juice, or caffeine-free drinks are recommended. Not drinking enough fluid may cause constipation.   Eat a variety of high-fiber foods rather than one type of fiber.   Try to increase your intake of fiber through using high-fiber foods rather than fiber pills or supplements that contain small amounts of fiber.   The goal is to change the types of food eaten. Do not supplement your present diet with high-fiber foods, but replace foods in your present diet.     Diverticulosis Diverticulosis is a common condition that develops when small pouches (diverticula) form in the wall of the colon. The risk of diverticulosis increases with age. It happens more often in people who eat a low-fiber diet. Most individuals with diverticulosis have no symptoms. Those individuals with symptoms usually experience belly (abdominal) pain, constipation, or loose stools (diarrhea).  HOME CARE INSTRUCTIONS  Increase the amount of fiber in your diet as directed by your caregiver or dietician. This may reduce symptoms of diverticulosis.   Drink at least 6 to 8 glasses of water each day to prevent constipation.   Try not to strain when you have a bowel movement.   THERE IS NO NEED TO Avoid nuts and seeds to prevent complications.   FOODS HAVING HIGH FIBER CONTENT INCLUDE:  Fruits. Apple, peach,  pear, tangerine, raisins, prunes.   Vegetables. Brussels sprouts, asparagus, broccoli, cabbage, carrot, cauliflower, romaine lettuce, spinach, summer squash, tomato, winter squash, zucchini.   Starchy Vegetables. Baked beans, kidney beans, lima beans, split peas, lentils, potatoes (with skin).

## 2019-02-15 NOTE — Op Note (Signed)
[facility] Patient Name: Paula [name] Procedure Date: 02/15/2019 8:48 AM MRN: [id number] Date of Birth: 11/11/1950 Attending MD: Barney Drain MD, MD CSN: [id number] Age: 68 Admit Type: Outpatient Procedure:                Colonoscopy, SURVEILLANCE Indications:              Personal history of colonic polyps Providers:                Barney Drain MD, MD, Nelda Severe, RN, Randa Spike, Technician Referring MD:             Moshe Cipro Medicines:                Meperidine 100 mg IV, Midazolam 6 mg IV Complications:            No immediate complications. Estimated Blood Loss:     Estimated blood loss: none. Procedure:                Pre-Anesthesia Assessment:                           - Prior to the procedure, a History and Physical                            was performed, and patient medications and                            allergies were reviewed. The patient's tolerance of                            previous anesthesia was also reviewed. The risks                            and benefits of the procedure and the sedation                            options and risks were discussed with the patient.                            All questions were answered, and informed consent                            was obtained. Prior Anticoagulants: The patient has                            taken no previous anticoagulant or antiplatelet                            agents. ASA Grade Assessment: III - A patient with                            severe systemic disease. After reviewing the risks  and benefits, the patient was deemed in                            satisfactory condition to undergo the procedure.                            After obtaining informed consent, the colonoscope                            was passed under direct vision. Throughout the                            procedure, the patient's blood pressure, pulse, and                           oxygen saturations were monitored continuously. The                            [id number]) was introduced through the                            anus and advanced to the the cecum, identified by                            appendiceal orifice and ileocecal valve. The                            colonoscopy was somewhat difficult due to a                            tortuous colon. Successful completion of the                            procedure was aided by straightening and shortening                            the scope to obtain bowel loop reduction and                            COLOWRAP. The patient tolerated the procedure well.                            The quality of the bowel preparation was excellent. Scope In: 9:34:19 AM Scope Out: 9:48:03 AM Scope Withdrawal Time: 0 hours 10 minutes 18 seconds  Total Procedure Duration: 0 hours 13 minutes 44 seconds  Findings:      Multiple small and large-mouthed diverticula were found in the       recto-sigmoid colon and sigmoid colon.      External and internal hemorrhoids were found.      The recto-sigmoid colon and sigmoid colon were mildly tortuous. Impression:               - MODERATE Diverticulosis in the recto-sigmoid  colon and in the sigmoid colon.                           - External and internal hemorrhoids.                           - Tortuous colon. Moderate Sedation:      Moderate (conscious) sedation was administered by the endoscopy nurse       and supervised by the endoscopist. The following parameters were       monitored: oxygen saturation, heart rate, blood pressure, and response       to care. Total physician intraservice time was 31 minutes. Recommendation:           - Patient has a contact number available for                            emergencies. The signs and symptoms of potential                            delayed complications were discussed with the                             patient. Return to normal activities tomorrow.                            Written discharge instructions were provided to the                            patient.                           - High fiber diet.                           - Continue present medications.                           - Repeat colonoscopy in 5 years for surveillance. Procedure Code(s):        --- Professional ---                           [id number], Colonoscopy, flexible; diagnostic, including                            collection of specimen(s) by brushing or washing,                            when performed (separate procedure)                           99153, Moderate sedation; each additional 15                            minutes intraservice time                           G0500,  Moderate sedation services provided by the                            same physician or other qualified health care                            professional performing a gastrointestinal                            endoscopic service that sedation supports,                            requiring the presence of an independent trained                            observer to assist in the monitoring of the                            patient's level of consciousness and physiological                            status; initial 15 minutes of intra-service time;                            patient age 13 years or older (additional time may                            be reported with [phone], as appropriate) Diagnosis Code(s):        --- Professional ---                           K64.8, Other hemorrhoids                           Z86.010, Personal history of colonic polyps                           K57.30, Diverticulosis of large intestine without                            perforation or abscess without bleeding                           Q43.8, Other specified congenital malformations of                            intestine CPT  copyright 2019 American Medical Association. All rights reserved. The codes documented in this report are preliminary and upon coder review may  be revised to meet current compliance requirements. Barney Drain, MD Barney Drain MD, MD 02/15/2019 10:04:28 AM This report has been signed electronically. Number of Addenda: 0

## 2019-02-20 ENCOUNTER — Encounter (HOSPITAL_COMMUNITY): Payer: Self-pay | Admitting: Gastroenterology

## 2019-07-06 ENCOUNTER — Other Ambulatory Visit: Payer: Self-pay | Admitting: Internal Medicine

## 2019-09-21 ENCOUNTER — Other Ambulatory Visit (INDEPENDENT_AMBULATORY_CARE_PROVIDER_SITE_OTHER): Payer: Self-pay | Admitting: Internal Medicine

## 2019-09-21 ENCOUNTER — Other Ambulatory Visit: Payer: Self-pay | Admitting: Internal Medicine

## 2019-09-21 DIAGNOSIS — R0600 Dyspnea, unspecified: Secondary | ICD-10-CM

## 2019-10-14 ENCOUNTER — Ambulatory Visit
Admission: RE | Admit: 2019-10-14 | Discharge: 2019-10-14 | Disposition: A | Discharge status: Home / Self Care | Payer: Medicare Other | Source: Ambulatory Visit | Attending: Internal Medicine | Admitting: Internal Medicine

## 2019-10-14 DIAGNOSIS — I058 Other rheumatic mitral valve diseases: Secondary | ICD-10-CM | POA: Insufficient documentation

## 2019-10-14 DIAGNOSIS — R0600 Dyspnea, unspecified: Secondary | ICD-10-CM | POA: Insufficient documentation

## 2019-10-14 DIAGNOSIS — I051 Rheumatic mitral insufficiency: Secondary | ICD-10-CM | POA: Insufficient documentation

## 2019-11-04 ENCOUNTER — Telehealth: Payer: Medicare Other

## 2019-11-04 ENCOUNTER — Encounter: Payer: Self-pay | Admitting: Otolaryngology

## 2019-11-23 ENCOUNTER — Ambulatory Visit: Payer: Medicare Other | Admitting: Registered Nurse

## 2019-11-23 ENCOUNTER — Ambulatory Visit
Admission: RE | Admit: 2019-11-23 | Discharge: 2019-11-23 | Disposition: A | Discharge status: Home / Self Care | Payer: Medicare Other | Source: Ambulatory Visit | Attending: Otolaryngology | Admitting: Otolaryngology

## 2019-11-23 ENCOUNTER — Ambulatory Visit: Payer: Self-pay

## 2019-11-23 ENCOUNTER — Encounter
Admission: RE | Disposition: A | Discharge status: Home / Self Care | Payer: Self-pay | Source: Ambulatory Visit | Attending: Otolaryngology

## 2019-11-23 DIAGNOSIS — M8580 Other specified disorders of bone density and structure, unspecified site: Secondary | ICD-10-CM | POA: Insufficient documentation

## 2019-11-23 DIAGNOSIS — T8579XA Infection and inflammatory reaction due to other internal prosthetic devices, implants and grafts, initial encounter: Secondary | ICD-10-CM

## 2019-11-23 DIAGNOSIS — K219 Gastro-esophageal reflux disease without esophagitis: Secondary | ICD-10-CM | POA: Insufficient documentation

## 2019-11-23 DIAGNOSIS — E785 Hyperlipidemia, unspecified: Secondary | ICD-10-CM | POA: Insufficient documentation

## 2019-11-23 HISTORY — PX: INSERTION, OSSEOINTEGRATED IMP/TEMPORAL BONE/BAHA: SHX4379

## 2019-11-23 HISTORY — DX: Hyperlipidemia, unspecified: E78.5

## 2019-11-23 SURGERY — INSERTION, OSSEOINTEGRATED IMP/TEMPORAL BONE/BAHA
Anesthesia: Anesthesia General | Laterality: Right | Wound class: Clean

## 2019-11-23 MED ORDER — DEXAMETHASONE SODIUM PHOSPHATE 4 MG/ML IJ SOLN (WRAP)
INTRAMUSCULAR | Status: DC | PRN
Start: 2019-11-23 — End: 2019-11-23
  Administered 2019-11-23 (×2): 4 mg via INTRAVENOUS

## 2019-11-23 MED ORDER — LACTATED RINGERS IV SOLN
INTRAVENOUS | Status: DC
Start: 2019-11-23 — End: 2019-11-23

## 2019-11-23 MED ORDER — CEFAZOLIN SODIUM 1 G IJ SOLR
INTRAMUSCULAR | Status: DC | PRN
Start: 2019-11-23 — End: 2019-11-23
  Administered 2019-11-23: 2 g via INTRAVENOUS

## 2019-11-23 MED ORDER — OXYCODONE-ACETAMINOPHEN 5-325 MG PO TABS
1.0000 | ORAL_TABLET | ORAL | 0 refills | Status: AC | PRN
Start: 2019-11-23 — End: 2019-11-30

## 2019-11-23 MED ORDER — DEXAMETHASONE SODIUM PHOSPHATE 20 MG/5ML IJ SOLN
INTRAMUSCULAR | Status: AC
Start: 2019-11-23 — End: ?
  Filled 2019-11-23: qty 5

## 2019-11-23 MED ORDER — MUPIROCIN 2 % EX OINT
TOPICAL_OINTMENT | CUTANEOUS | Status: DC | PRN
Start: 2019-11-23 — End: 2019-11-23
  Administered 2019-11-23: 1 g via TOPICAL

## 2019-11-23 MED ORDER — PROPOFOL 10 MG/ML IV EMUL (WRAP)
INTRAVENOUS | Status: DC | PRN
Start: 2019-11-23 — End: 2019-11-23
  Administered 2019-11-23: 150 mg via INTRAVENOUS

## 2019-11-23 MED ORDER — CEFAZOLIN SODIUM 1 G IJ SOLR
INTRAMUSCULAR | Status: AC
Start: 2019-11-23 — End: ?
  Filled 2019-11-23: qty 2000

## 2019-11-23 MED ORDER — AMMONIA AROMATIC IN INHA
1.0000 | Freq: Once | RESPIRATORY_TRACT | Status: DC | PRN
Start: 2019-11-23 — End: 2019-11-23

## 2019-11-23 MED ORDER — SODIUM CHLORIDE 0.9% BAG (IRRIGATION USE)
INTRAVENOUS | Status: DC | PRN
Start: 2019-11-23 — End: 2019-11-23
  Administered 2019-11-23: 1000 mL

## 2019-11-23 MED ORDER — PROPOFOL 10 MG/ML IV EMUL (WRAP)
INTRAVENOUS | Status: AC
Start: 2019-11-23 — End: ?
  Filled 2019-11-23: qty 20

## 2019-11-23 MED ORDER — KETOROLAC TROMETHAMINE 30 MG/ML IJ SOLN
INTRAMUSCULAR | Status: DC | PRN
Start: 2019-11-23 — End: 2019-11-23
  Administered 2019-11-23: 30 mg via INTRAVENOUS

## 2019-11-23 MED ORDER — HYDROMORPHONE HCL 0.5 MG/0.5 ML IJ SOLN
0.5000 mg | INTRAMUSCULAR | Status: DC | PRN
Start: 2019-11-23 — End: 2019-11-23

## 2019-11-23 MED ORDER — ONDANSETRON HCL 4 MG/2ML IJ SOLN
INTRAMUSCULAR | Status: DC | PRN
Start: 2019-11-23 — End: 2019-11-23
  Administered 2019-11-23: 4 mg via INTRAVENOUS

## 2019-11-23 MED ORDER — LIDOCAINE HCL 2 % IJ SOLN
INTRAMUSCULAR | Status: DC | PRN
Start: 2019-11-23 — End: 2019-11-23
  Administered 2019-11-23: 60 mg via INTRAVENOUS

## 2019-11-23 MED ORDER — PROPOFOL INFUSION 10 MG/ML
INTRAVENOUS | Status: DC | PRN
Start: 2019-11-23 — End: 2019-11-23
  Administered 2019-11-23: 100 ug/kg/min via INTRAVENOUS

## 2019-11-23 MED ORDER — FENTANYL CITRATE (PF) 50 MCG/ML IJ SOLN (WRAP)
INTRAMUSCULAR | Status: AC
Start: 2019-11-23 — End: ?
  Filled 2019-11-23: qty 2

## 2019-11-23 MED ORDER — LACTATED RINGERS IV SOLN
INTRAVENOUS | Status: DC | PRN
Start: 2019-11-23 — End: 2019-11-23

## 2019-11-23 MED ORDER — PROPOFOL 10 MG/ML IV EMUL (WRAP)
INTRAVENOUS | Status: AC
Start: 2019-11-23 — End: ?
  Filled 2019-11-23: qty 50

## 2019-11-23 MED ORDER — FENTANYL CITRATE (PF) 50 MCG/ML IJ SOLN (WRAP)
INTRAMUSCULAR | Status: DC | PRN
Start: 2019-11-23 — End: 2019-11-23
  Administered 2019-11-23: 25 ug via INTRAVENOUS
  Administered 2019-11-23: 50 ug via INTRAVENOUS

## 2019-11-23 MED ORDER — ONDANSETRON HCL 4 MG/2ML IJ SOLN
4.0000 mg | Freq: Once | INTRAMUSCULAR | Status: DC | PRN
Start: 2019-11-23 — End: 2019-11-23

## 2019-11-23 MED ORDER — FENTANYL CITRATE (PF) 50 MCG/ML IJ SOLN (WRAP)
25.0000 ug | INTRAMUSCULAR | Status: DC | PRN
Start: 2019-11-23 — End: 2019-11-23

## 2019-11-23 MED ORDER — ONDANSETRON HCL 4 MG/2ML IJ SOLN
INTRAMUSCULAR | Status: AC
Start: 2019-11-23 — End: ?
  Filled 2019-11-23: qty 2

## 2019-11-23 MED ORDER — LIDOCAINE HCL (PF) 2 % IJ SOLN
INTRAMUSCULAR | Status: AC
Start: 2019-11-23 — End: ?
  Filled 2019-11-23: qty 5

## 2019-11-23 MED ORDER — GLYCOPYRROLATE 0.2 MG/ML IJ SOLN (WRAP)
INTRAMUSCULAR | Status: DC | PRN
Start: 2019-11-23 — End: 2019-11-23
  Administered 2019-11-23: .1 mg via INTRAVENOUS

## 2019-11-23 MED ORDER — LIDOCAINE-EPINEPHRINE 1 %-1:100000 IJ SOLN
INTRAMUSCULAR | Status: DC | PRN
Start: 2019-11-23 — End: 2019-11-23
  Administered 2019-11-23: 4 mL

## 2019-11-23 SURGICAL SUPPLY — 79 items
BLADE 60 D BEVEL DOWN W2.5 MM NARROW (Blade) ×1
BLADE 60 D BEVEL DOWN W2.5 MM NARROW SHAFT SHARP ALL AROUND BEAVER (Blade) ×1 IMPLANT
BLADE AND 60 DEG SHP (Blade) ×1
COVER FLEXIBLE LIGHT HANDLE PLASTIC GREEN (Procedure Accessories) ×2 IMPLANT
COVER FLEXIBLE MEDLINE LIGHT HANDLE (Procedure Accessories) ×2
CVR LGHTHNDL FLEX SFT 1PK (Procedure Accessories) ×2
DRAPE CRANIOTOMY (Drape) ×1
DRAPE EQUIPMENT W48 IN X H118 IN OPMI (Drape) ×1
DRAPE MICROSCOPE TYPE 25 OPMI VISIONGUARD 48 X 118 IN (Drape) ×1 IMPLANT
DRAPE OPMI (Drape) ×1
DRAPE SURGICAL L11.5 IN X W7.5 IN POLY (Drape) ×1
DRAPE SURGICAL L11.5 IN X W7.5 IN POLY CRANIOTOMY (Drape) ×1 IMPLANT
DRESSING EAR GLASSOCK ADLT (Dressing)
DRESSING EAR GLASSOCK PED (Dressing)
DRESSING EAR GLSCK 5.5IN LF ADLT (Dressing)
DRESSING EAR OD5.5 IN GLASSCOCK ADULT (Dressing) IMPLANT
DRESSING TEGADERM FRAME 6X7CM (Dressing) ×4
DRESSING TRANSPARENT L2 3/4 IN X W2 3/8 (Dressing) ×4
DRESSING TRANSPARENT L2 3/4 IN X W2 3/8 IN POLYURETHANE ADHESIVE (Dressing) ×4 IMPLANT
ELECTRODE ADULT PATIENT RETURN L9 FT REM POLYHESIVE ACRYLIC FOAM (Procedure Accessories) ×1 IMPLANT
ELECTRODE EEG NDL RCD SBDRL (Nervemon) ×1
ELECTRODE EEG NEEDLE SUBDURAL RECORD (Nervemon) ×1 IMPLANT
ELECTRODE PATIENT RETURN L9 FT VALLEYLAB (Procedure Accessories) ×1
EYE PAD CURITY 1-5/8X2-5/8IN (Dressing)
GLOVE SURGICAL 8 1/2 BIOGEL PI INDICATOR (Glove)
GLOVE SURGICAL 8 1/2 BIOGEL PI INDICATOR UNDERGLOVE POWDER FREE SMOOTH (Glove) IMPLANT
GLOVE SURGICAL 8 BIOGEL PI MICRO POWDER (Glove) ×1
GLOVE SURGICAL 8 BIOGEL PI MICRO POWDER FREE BEAD CUFF MICRO ROUGHENED (Glove) ×1 IMPLANT
GLOVES BIOGEL PI MICRO SZ 8 (Glove) ×1
GOWN SURGICAL XL POLY BLUE LEVEL 4 (Gown)
GOWN SURGICAL XL POLY BLUE LEVEL 4 IMPERVIOUS REINFORCE SET IN SLEEVE (Gown) IMPLANT
GOWN X-LRG POLY REINFORCED (Gown)
KIT DRESSING OD3.5 IN GLASSCOCK EAR (Dressing) IMPLANT
MEROGEL ODOLOGIC PACKING 4X4CM (Packing)
NIM ELECTRD 8 CHANN (Nervemon) ×1
PACK SURGICAL BASIN SET FOAK (Other) ×1 IMPLANT
PACK SURGICAL BASIN SET FOAK MEDLINE INDUSTRIES, INC. (Other) ×1 IMPLANT
PACKING EAR 9 MM X 13 MM SCHINDLER EAR (Packing)
PACKING EAR 9 MM X 13 MM SCHINDLER EAR PACKING DRAWSTRING MEROCEL (Packing) IMPLANT
PACKING NASAL L4 CM X W4 CM DRESSING (Packing)
PACKING NASAL L4 CM X W4 CM DRESSING SINUS STENT HYDRATE MEROGEL HYAFF (Packing) IMPLANT
PAD ARMBOARD FOAM 20X8X2IN (Positioning Supplies) ×1
PAD ARMBOARD L20 IN X W8 IN X H2 IN (Positioning Supplies) ×1
PAD ARMBOARD L20 IN X W8 IN X H2 IN CONVOLUTE FOAM PURPLE (Positioning Supplies) ×1 IMPLANT
PAD ELECTROSRG GRND REM W CRD (Procedure Accessories) ×1
PAD EYE L2 5/8 IN X W2 1/8 IN (Dressing) IMPLANT
PAK EAR SCHINDLER W/STRING (Packing)
POSITIONER HEAD DONUT 9IN (Positioning Supplies) ×2
POSITIONER HEAD FOAM POSITIONING DONUT OD9 IN (Positioning Supplies) ×1 IMPLANT
SET BASIN SINGLE DISP (Other) ×1
SLEEVE SURGICAL L21.5 IN ELASTIC END (Drape)
SLEEVE SURGICAL L21.5 IN ELASTIC END NONWOVEN OD3-5.5 IN CONVERTORS (Drape) IMPLANT
SOL NACL .9% IRRIG 3000ML ARTH (Irrigation Solutions)
SOLUTION IRRIGATION 0.9% SODIUM CHLORIDE (Irrigation Solutions) IMPLANT
SPONGE ABSORBABLE L6 CM X W2 CM PORCINE (Hemostat) ×1
SPONGE ABSORBABLE L6 CM X W2 CM PORCINE GELATIN SURGIFOAM THK7 MM 12-7 (Hemostat) ×1 IMPLANT
SPONGE GELATIN SURGIFOAM S12-7 (Hemostat) ×1
STAPLER APPOSE ULC 35 W SKIN (Staplers) ×1
STAPLER SKIN L4.1 MM X W6.5 MM 35 WIDE (Staplers) ×1
STAPLER SKIN L4.1 MM X W6.5 MM 35 WIDE STAPLE CARTRIDGE APPOSE ULC (Staplers) ×1 IMPLANT
STERILE SLEEVE (Drape)
SUTURE CHROMIC 4-0 PS2 18IN (Suture) ×1
SUTURE CHROMIC GUT CHROMIC 4-0 PS-2 L18 (Suture) ×1 IMPLANT
SUTURE MONOCRYL 4-0 PS-2 L27 IN (Suture) ×1
SUTURE MONOCRYL 4-0 PS-2 L27 IN MONOFILAMENT UNDYED ABSORBABLE (Suture) ×1 IMPLANT
SUTURE MONOCRYL 4-0 PS2 27IN (Suture) ×1
SYRINGE 10 ML GRADUATE NONPYROGENIC DEHP (Syringes, Needles) ×2
SYRINGE 10 ML GRADUATE NONPYROGENIC DEHP FREE PVC FREE LOK MEDICAL (Syringes, Needles) ×2 IMPLANT
SYRINGE IRR BULB 60ML (Syringes, Needles) ×1
SYRINGE LUER LOCK 10CC (Syringes, Needles) ×2
SYRINGE MEDLINE 60 ML LID SOFT BULB TIP (Syringes, Needles) ×1
SYRINGE MEDLINE 60 ML LID SOFT BULB TIP IRRIGATION TYVEK (Syringes, Needles) ×1 IMPLANT
TOWEL L27 IN X W17 IN COTTON PREWASH (Other) ×1
TOWEL L27 IN X W17 IN COTTON PREWASH DELINT HIGH ABSORBENT BLUE (Other) ×1 IMPLANT
TOWEL OR DISP 10PK (Other) ×1
TRAY ENT IRRIGATION IFH (Tray) ×2 IMPLANT
TRAY MAJOR EAR FFX (Pack) ×1
TRAY SURGICAL MAJOR EAR (Pack) ×1 IMPLANT
UNDRGLOV SZ8.5 PI INDICATR BLU (Glove)

## 2019-11-23 NOTE — Op Note (Signed)
FULL OPERATIVE NOTE    Date Time: 11/23/19 8:09 PM  Patient Name: Audrey Ray, Audrey Ray  Attending Physician: No att. 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 After a timeout was performed, general anethesia was induced, and an LMA was placed.  The right postauricular area was prepped and draped in sterile fashion.  The site of the implant as was marked and the planned incision. It was then injected with 4cc of 1% lidocaine with 1:100000 epinephrine.  A linear postauricular incision was made as marked just anterior to the [facility] attract magnet. A flap was elevated posteriorly to expose the mplant laterally and medially. The overlying skin flap was retracted to allow access to the screw that secures the magnet in place. The screw was loosened then the magnet removed without incident. Minor bleeding was encountered posteriorly during development of this flap. Hemostasis was achieved with bipolar electrocautery. Closure was performed in two layers. The deep layer was closed with interrupted 4-0 Monocryl sutures. The superificial layer was closure of the epidermis was closed with a running 5-0 fast absorbing gut suture. The incision was covered with mupirocin ointment. The patient was returned to the anesthesia team. General anesthesia was reveresed,  the patient was awakened and the LMA was removed. The patient was then transferred to the recovery room in stable condition.    Estimated Blood Loss:   * No values recorded between 11/23/2019 10:24 AM and 11/23/2019 11:22 AM *    Implants:   * No implants in log *    Drains:   Drains: no    Specimens:     ID Type Source Tests Collected by Time Destination   A : BAHA  ATTRACT MAGNET Gross Only Bone SURGICAL PATHOLOGY Thurman Coyer, MD 11/23/2019 1109          Complications:   None    Signed by: Thurman Coyer, MD

## 2019-11-23 NOTE — Anesthesia Postprocedure Evaluation (Signed)
Anesthesia Post Evaluation    Patient: Audrey Ray    Procedure(s) with comments:  REMOVAL OF OSSEOINTEGRATED IMP/TEMPORAL BONE/BAHA - REMOVAL OF RIGHT BAHA ATTRACT, DEBRIDEMENT OF RIGHT MASTOID CAVITY    Anesthesia type: general    Last Vitals:   Vitals Value Taken Time   BP 150/75 11/23/19 1200   Temp 36.8 C (98.2 F) 11/23/19 1200   Pulse 73 11/23/19 1200   Resp 17 11/23/19 1200   SpO2 97 % 11/23/19 1200                 Anesthesia Post Evaluation:     Patient Evaluated: PACU    Level of Consciousness: awake and alert    Pain Management: adequate    Airway Patency: patent    Anesthetic complications: No      PONV Status: none    Cardiovascular status: acceptable  Respiratory status: acceptable  Hydration status: acceptable        Signed by: Santiago Bumpers Giovanie Lefebre, 11/23/2019 3:21 PM

## 2019-11-23 NOTE — Discharge Instr - AVS First Page (Addendum)
Audrey Ray.  [phone]  Discharge Instructions for Patients who have had:  Ear Surgery    Dressing/Drainage  * There is a small incision behind your ear. You can apply antibiotic ointment such as mupirocin or bactroban twice daily for a week.   * You may have bloody or watery drainage from the ear incision for the first few days which is normal.  Keep gauze or band-aid over the ear until bleeding stops.    Medications  * Take all medications as prescribed.    Other  * Some patients may experience some numbness in and around the ear for up to 6 months.  As sensation returns, you may experience intermittent stabbing pains or dizziness.  These should be temporary due to the healing process.  A few patients have reported taste disturbance, and this is usually temporary.  * You will want to avoid getting tired and being around others who have a cold or the flu.  Being in crowds may increase your risk of exposure to others who have upper respiratory infections.  *Be sure to keep all post operative appointments with your doctor.      Call Triage at [phone] ext. [phone], Monday thru Friday 9am to 4pm  OR the Doctor on call if you have any questions or any of the following symptoms:    * The discharge from your ear develops an odor or is discolored - other than with blood.    * You suddenly have significant bloody, watery discharge or redness from the incision behind the surgical ear.    *You have an oral temperature greater than 101 degrees for 24 hours.    *Any progressively increasing tenderness or pain.    *Facial paralysis.      Post Operative Ear Surgery Activity Restrictions  *Avoid bending over, dont lift anything, dont bump head, and avoid straining, i.e., avoid constipation, for the first week.    * You may wash your hair on post-op day #1.  Also keep the incision behind ear dry until then.      *No swimming for one month for ALL ear surgeries.  Anesthesia Discharge Instructions: After  Your Surgery  Youve just had surgery. During surgery you were given medicine called anesthesia to keep you relaxed and comfortable. After surgery you may have some pain or nausea. This is common. Your doctor or nurse will show you how to take care of yourself when you go home. He or she will also answer your questions. Here are some tips for feeling better and getting well after surgery.        For the first 24 hours after your surgery:   Do not drive or use heavy equipment.    Do not make important decisions or sign legal papers.   Do not drink alcohol.   Have someone stay with you, if needed. He or she can watch for problems and help keep you safe.   Have an adult family member or friend drive you home.    Managing pain  If you have pain after surgery, pain medicine will help you feel better. Take it as ordered by your surgeon, before pain becomes severe. Also, ask your doctor about other ways to control pain. This might be with rest, ice, repositioning, and elevation. Follow any other instructions your surgeon or nurse gives you.    Tips for taking pain medicine:    Stay on schedule with your medication   Most pain relievers taken by  mouth need at least 20 to 30 minutes to start to work.   Taking medicine on a schedule can help you remember to take it. Try to time your medicine so that you can take it before starting an activity. This might be before you get dressed, go for a walk, or sit down for dinner.    Common side effects of prescription pain medications   Pain medicines can cause nausea. Eat a little food before taking pain medicine to avoid this.   Constipation is a common side effect of pain medicines. Drinking lots of fluids and eating foods, such as fruits and vegetables, that are high in fiber can also help.    Call your doctor before taking any medicines such as laxatives or stool softeners to help ease constipation. Also, ask if you should avoid any foods. Remember, do not take laxatives  unless your surgeon has prescribed them   Drinking alcohol and taking pain medicines can cause dizziness and slow your breathing. It can even be deadly. Do not drink alcohol while taking pain medicines.   Pain medicines can make you react more slowly to things. Do not drive or run machinery while taking pain medicines.    Important facts about Acetaminophen (Tylenol)   Acetaminophen or Tylenol is a common pain reliever.   Check your prescription pain medication labels to see if it contains acetaminophen.   Your health care provider may tell you to take acetaminophen to help ease your pain. Ask him or her how much you should to take each day.    Some prescription pain medicines have acetaminophen and other ingredients. Using both prescription pain medicines and over the counter (OTC) acetaminophen for pain can cause you to overdose.   Max dose of acetaminophen is 4000mg /24 hours for most people. Overdosing on acetaminophen may lead to liver failure.   Read the labels on your (over the counter) medicines with care. This will help you to clearly know the list of ingredients, how much to take, and any warnings. This will prevent you from taking too much acetaminophen.   If you have questions or do not understand the information, ask your pharmacist or health care provider to explain it to you before you take the OTC medicine.    Managing nausea  Some people have an upset stomach after surgery. This is often because of anesthesia, pain, pain medicines, or the stress of surgery. If you were on a special food plan before surgery, ask your doctor if you should follow it while you get better. The following are tips to help you manage nausea after anesthesia:   Do not push yourself to eat. Your body will tell you when to eat and how much.   Start off with clear liquids and soup. They are easier to digest.   Next try semi-solid foods such as mashed potatoes, applesauce, and gelatin, as you feel ready.   Slowly  move to solid foods. Dont eat fatty, rich, or spicy foods at first.   Do not force yourself to have 3 large meals a day. Instead eat smaller amounts more often.   Take pain medicines with a small amount of food, such as crackers or toast, to avoid nausea.     Call your surgeon if:   You have worsening pain an hour after taking pain medicine. The pain medicine may not be strong enough.   You feel too sleepy, dizzy, or groggy. The pain medicine may be too strong.   You  You have side effects like persistent nausea, vomiting, or skin changes such as rash, itching, or hives.    You have bleeding through your dressing or your dressing becomes saturated.   You have signs of infection such as redness, fever, or increased/foul smelling drainage.      If you have obstructive sleep apnea (OSA)  You were given anesthesia medicine during surgery to keep you comfortable and free of pain. After surgery, you may have more apnea spells because of this medicine and other medicines you were given. These episodes may last longer than usual.   At home:   Keep using the continuous positive airway pressure (CPAP) device when you sleep. Unless your health care provider tells you not to, use it when you sleep,   or take a nap; day or night. CPAP is a common device used to treat OSA.   *    Sleep/nap in upright position(ie. with pillows), or use a recliner for first week  and/or while on opioid medications or medications that are sedating.  *     Another adult needs to be with you at all times during the first 24 hours home.  *     Limit opioid use when possible and use alternative comfort measures.  * Talk with your provider before taking any pain medicine, muscle relaxants, or  sedatives. Your provider will tell you about the possible dangers of taking these         medicines.

## 2019-11-23 NOTE — Brief Op Note (Signed)
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 Thin capsule overlying implant, removed en bloc. Healthy appearing surrounding tissue.     Complications:   none      Signed by: Iantha Fallen, MD                                                                              [facility]

## 2019-11-23 NOTE — H&P (Signed)
History reviewed and the patient examined by me. H&P is up to date and without changes.

## 2019-11-23 NOTE — Anesthesia Preprocedure Evaluation (Signed)
Anesthesia Evaluation    AIRWAY    Mallampati: II    TM distance: >3 FB  Neck ROM: full  Mouth Opening:full  Planned to use difficult airway equipment: No CARDIOVASCULAR    cardiovascular exam normal       DENTAL    no notable dental hx     PULMONARY    pulmonary exam normal     OTHER FINDINGS                  Relevant Problems   CARDIO   (+) Migraine headache      GI   (+) Gastroesophageal reflux disease with esophagitis       PSS Anesthesia Comments: Generally healthy, significant h/o PONV        Anesthesia Plan    ASA 2     general                                 informed consent obtained    ECG reviewed  pertinent labs reviewed             Signed by: Chrissie Noa A Reginald Mangels 11/23/19 10:16 AM

## 2019-11-23 NOTE — Transfer of Care (Signed)
Anesthesia Transfer of Care Note    Patient: Audrey Ray    Procedures performed: Procedure(s) with comments:  REMOVAL OF OSSEOINTEGRATED IMP/TEMPORAL BONE/BAHA - REMOVAL OF RIGHT BAHA ATTRACT, DEBRIDEMENT OF RIGHT MASTOID CAVITY    Anesthesia type: General LMA    Patient location:Phase I PACU    Last vitals:   Vitals:    11/23/19 0923   BP: 139/68   Pulse: 65   Temp: 36.7 C (98 F)   SpO2: 100%       Post pain: Patient not complaining of pain, continue current therapy      Mental Status:sedated    Respiratory Function: tolerating face mask    Cardiovascular: stable    Nausea/Vomiting: patient not complaining of nausea or vomiting    Hydration Status: adequate    Post assessment: no apparent anesthetic complications    Signed by: Chrissie Noa A Ebbie Cherry  11/23/19 11:26 AM

## 2019-11-24 ENCOUNTER — Encounter: Payer: Self-pay | Admitting: Otolaryngology

## 2020-02-08 ENCOUNTER — Other Ambulatory Visit: Payer: Self-pay | Admitting: Otolaryngology

## 2020-02-08 ENCOUNTER — Ambulatory Visit
Admission: RE | Admit: 2020-02-08 | Discharge: 2020-02-08 | Disposition: A | Discharge status: Home / Self Care | Payer: Medicare Other | Source: Ambulatory Visit | Attending: Otolaryngology | Admitting: Otolaryngology

## 2020-02-08 DIAGNOSIS — H903 Sensorineural hearing loss, bilateral: Secondary | ICD-10-CM

## 2020-02-16 ENCOUNTER — Ambulatory Visit
Admission: RE | Admit: 2020-02-16 | Discharge: 2020-02-16 | Disposition: A | Discharge status: Home / Self Care | Payer: Medicare Other | Source: Ambulatory Visit | Attending: Otolaryngology | Admitting: Otolaryngology

## 2020-02-16 DIAGNOSIS — H903 Sensorineural hearing loss, bilateral: Secondary | ICD-10-CM | POA: Insufficient documentation

## 2020-02-16 DIAGNOSIS — Z9889 Other specified postprocedural states: Secondary | ICD-10-CM | POA: Insufficient documentation

## 2020-02-16 DIAGNOSIS — R9089 Other abnormal findings on diagnostic imaging of central nervous system: Secondary | ICD-10-CM | POA: Insufficient documentation

## 2020-02-16 MED ORDER — GADOBUTROL 1 MMOL/ML IV SOLN
7.0000 mL | Freq: Once | INTRAVENOUS | Status: AC | PRN
Start: 2020-02-16 — End: 2020-02-16
  Administered 2020-02-16: 13:00:00 7 mmol via INTRAVENOUS
  Filled 2020-02-16: qty 7.5

## 2020-03-09 ENCOUNTER — Ambulatory Visit (INDEPENDENT_AMBULATORY_CARE_PROVIDER_SITE_OTHER): Payer: Medicare Other | Admitting: Internal Medicine

## 2020-03-09 ENCOUNTER — Encounter: Payer: Self-pay | Admitting: Internal Medicine

## 2020-03-09 ENCOUNTER — Other Ambulatory Visit: Payer: Self-pay

## 2020-03-09 VITALS — BP 145/75 | HR 89 | Temp 97.3°F | Wt 268.8 lb

## 2020-03-09 DIAGNOSIS — K859 Acute pancreatitis without necrosis or infection, unspecified: Secondary | ICD-10-CM | POA: Diagnosis not present

## 2020-03-09 DIAGNOSIS — R1013 Epigastric pain: Secondary | ICD-10-CM | POA: Diagnosis not present

## 2020-03-09 DIAGNOSIS — Z86010 Personal history of colonic polyps: Secondary | ICD-10-CM

## 2020-03-09 NOTE — Patient Instructions (Signed)
I will obtain your hospital records from your recent admission.  Pending these results we will help determine what the next course of action is.  I want you to avoid all alcohol going forward.  I am going to start you on pancreatic enzyme replacement therapy today.  I want you to take 2 tablets 3 times a day with meals.  If this helps, please let me know and I will send in a formal prescription.  Follow-up in 4 to 6 weeks or sooner if needed.  At [facility] we value your feedback. You may receive a survey about your visit today. Please share your experience as we strive to create trusting relationships with our patients to provide genuine, compassionate, quality care.  We appreciate your understanding and patience as we review any laboratory studies, imaging, and other diagnostic tests that are ordered as we care for you. Our office policy is 5 business days for review of these results, and any emergent or urgent results are addressed in a timely manner for your best interest. If you do not hear from our office in 1 week, please contact us.   We also encourage the use of MyChart, which contains your medical information for your review as well. If you are not enrolled in this feature, an access code is on this after visit summary for your convenience. Thank you for allowing [place] to be involved in your care.  It was great to see you today!  I hope you have a great rest of your winter!!    Elon Alas. Abbey Chatters, D.O. Gastroenterology and Hepatology [facility]

## 2020-03-12 NOTE — Progress Notes (Signed)
Primary Care Physician:  Moshe Cipro, MD Primary Gastroenterologist:  Dr. Abbey Chatters  Chief Complaint  Patient presents with   Pancreatitis    Yellow loose stools 4 times daily started 02/17/20    HPI:   Paula [name] is a 69 y.o. female who presents to the clinic today by referral from her PCP Dr. Deatra Ina for evaluation.  Patient states that she was recently admitted to hospital in [facility] for acute pancreatitis.  She states that this occurred after eating a cheeseburger.  Notes drinking 1 glass of wine most nights and occasional glass of scotch 1-2 times per month.  No previous episodes of pancreatitis in the past.  She had an outpatient ultrasound done which showed a gallbladder polyp.  I do not have any records from her hospitalization.  Denies hypertriglyceridemia.  No history of autoimmune issues.  She is chronically on losartan and omeprazole.  She also takes Xolair.  States her abdominal pain is significantly improved, still having some soreness.  Her main complaint for me is diarrhea.  States she is having 3-4 yellow stools a day.  Last colonoscopy was last year WNL though the one before that had numerous adenomatous colon polyps.  Past Medical History:  Diagnosis Date   Anxiety    Arthritis    COPD (chronic obstructive pulmonary disease) ([place])    Hypertension    Lung nodule    LLL   Sleep apnea    Tubal ectopic pregnancy 1970    Past Surgical History:  Procedure Laterality Date   [place]   gland removed   COLONOSCOPY N/A 11/13/2015   Procedure: COLONOSCOPY;  Surgeon: Danie Binder, MD;  [facility];  Service: Endoscopy;  Laterality: N/A;  1030   COLONOSCOPY N/A 02/15/2019   Procedure: COLONOSCOPY;  Surgeon: Danie Binder, MD;  [facility];  Service: Endoscopy;  Laterality: N/A;  8:30am   ECTOPIC PREGNANCY SURGERY  1978    Current Outpatient Medications  Medication Sig Dispense Refill    acetaminophen (TYLENOL) 500 MG tablet Take 1,000 mg by mouth as needed (for pain.).     albuterol (VENTOLIN HFA) 108 (90 Base) MCG/ACT inhaler Inhale into the lungs as needed for wheezing or shortness of breath.     arformoterol (BROVANA) 15 MCG/2ML NEBU Take 15 mcg by nebulization See admin instructions. Use 1 nebulizer (15 mcg) scheduled with lunch, may use an additional nebulizer in the evening if needed for respiratory issues.     azelastine (ASTELIN) 0.1 % nasal spray Place 2 sprays into both nostrils 2 (two) times daily. Use in each nostril as directed     betamethasone dipropionate (DIPROLENE) 0.05 % cream Apply 1 application topically 2 (two) times daily as needed (SKIN IRRITATION).      budesonide-formoterol (SYMBICORT) 160-4.5 MCG/ACT inhaler Inhale 2 puffs into the lungs 2 (two) times daily.     Cholecalciferol (VITAMIN D) 125 MCG (5000 UT) CAPS Take 5,000 Units by mouth daily.      clotrimazole-betamethasone (LOTRISONE) cream Apply 1 application topically 2 (two) times daily as needed (skin irritation.).      diclofenac (VOLTAREN) 75 MG EC tablet Take 75 mg by mouth as needed (arthritis pain).     ezetimibe (ZETIA) 10 MG tablet Take 10 mg by mouth daily.     fluticasone (FLONASE) 50 MCG/ACT nasal spray Place 2 sprays into both nostrils daily.      hydrochlorothiazide (HYDRODIURIL) 25 MG tablet  Take 25 mg by mouth daily.      ketoconazole (NIZORAL) 2 % cream Apply 1 application topically 2 (two) times daily as needed for irritation.     losartan (COZAAR) 50 MG tablet Take 100 mg by mouth daily.      montelukast (SINGULAIR) 10 MG tablet Take 10 mg by mouth at bedtime.     Multiple Vitamins-Minerals (PRESERVISION AREDS) CAPS Take by mouth daily.     NON FORMULARY CPAP, O2 at night     omalizumab (XOLAIR) 150 MG injection Inject 225 mg into the skin every 14 (fourteen) days.     omeprazole (PRILOSEC) 40 MG capsule Take 40 mg by mouth daily.     sertraline (ZOLOFT) 100 MG  tablet Take 100 mg by mouth every evening.      Multiple Vitamin (MULTIVITAMIN WITH MINERALS) TABS tablet Take 1 tablet by mouth daily. One-A-Day (Patient not taking: Reported on 03/09/2020)     No current facility-administered medications for this visit.    Allergies as of 03/09/2020 - Review Complete 03/09/2020  Allergen Reaction Noted   Codeine Nausea Only 08/30/2014   Statins  03/09/2020    Family History  Problem Relation Age of Onset   Colon cancer Neg Hx     Social History   Socioeconomic History   Marital status: Married    Spouse name: Not on file   Number of children: Not on file   Years of education: Not on file   Highest education level: Not on file  Occupational History   Not on file  Tobacco Use   Smoking status: Former Smoker    Packs/day: 0.75    Years: 20.00    Pack years: 15.00    Types: Cigarettes   Smokeless tobacco: Former Systems developer    Quit date: 10/31/1998  Vaping Use   Vaping Use: Never used  Substance and Sexual Activity   Alcohol use: Yes    Comment: glass of wine 4-5 times weekly   Drug use: No   Sexual activity: Not on file  Other Topics Concern   Not on file  Social History Narrative   Not on file   Social Determinants of Health   Financial Resource Strain: Not on file  Food Insecurity: Not on file  Transportation Needs: Not on file  Physical Activity: Not on file  Stress: Not on file  Social Connections: Not on file  Intimate Partner Violence: Not on file    Subjective: Review of Systems  Constitutional: Negative for chills and fever.  HENT: Negative for congestion and hearing loss.   Eyes: Negative for blurred vision and double vision.  Respiratory: Negative for cough and shortness of breath.   Cardiovascular: Negative for chest pain and palpitations.  Gastrointestinal: Positive for abdominal pain and diarrhea. Negative for blood in stool, constipation, heartburn, melena and vomiting.  Genitourinary: Negative  for dysuria and urgency.  Musculoskeletal: Negative for joint pain and myalgias.  Skin: Negative for itching and rash.  Neurological: Negative for dizziness and headaches.  Psychiatric/Behavioral: Negative for depression. The patient is not nervous/anxious.        Objective: BP (!) 145/75    Pulse 89    Temp (!) 97.3 F (36.3 C) (Temporal)    Wt 268 lb 12.8 oz (121.9 kg)    BMI 50.79 kg/m  Physical Exam Constitutional:      Appearance: Normal appearance.  HENT:     Head: Normocephalic and atraumatic.  Eyes:     Extraocular Movements: Extraocular  movements intact.     Conjunctiva/sclera: Conjunctivae normal.  Cardiovascular:     Rate and Rhythm: Normal rate and regular rhythm.  Pulmonary:     Effort: Pulmonary effort is normal.     Breath sounds: Normal breath sounds.  Abdominal:     General: Bowel sounds are normal.     Palpations: Abdomen is soft.  Musculoskeletal:        General: No swelling. Normal range of motion.     Cervical back: Normal range of motion and neck supple.  Skin:    General: Skin is warm and dry.     Coloration: Skin is not jaundiced.  Neurological:     General: No focal deficit present.     Mental Status: She is alert and oriented to person, place, and time.  Psychiatric:        Mood and Affect: Mood normal.        Behavior: Behavior normal.      Assessment: *Pancreatitis *History of adenomatous colon polyps *Abdominal pain *Gallbladder polyp  Plan: Etiology of patient's recent hospital admission for pancreatitis unclear.  We will need to request all her hospital records including imaging to further evaluate.    Counseled patient on alcohol cessation going forward.  Given the small amount of alcohol she actually drinks, unclear if this actually caused the pancreatitis or not.  Ultrasound noted gallbladder polyp.  We will need a follow-up ultrasound in 6 to 12 months.  Consider trial off of omeprazole and/or losartan as these have both  been implicated in causing pancreatitis.  Going to start patient on pancreatic enzyme replacement therapy today, samples provided.  If noted improvement in diarrhea we will send in formal prescription.  Further recommendations to follow after medical records reviewed from hospitalization.  03/12/2020 4:03 PM   Disclaimer: This note was dictated with voice recognition software. Similar sounding words can inadvertently be transcribed and may not be corrected upon review.

## 2020-04-27 ENCOUNTER — Other Ambulatory Visit: Payer: Self-pay

## 2020-04-27 ENCOUNTER — Encounter: Payer: Self-pay | Admitting: Internal Medicine

## 2020-04-27 ENCOUNTER — Ambulatory Visit (INDEPENDENT_AMBULATORY_CARE_PROVIDER_SITE_OTHER): Payer: Medicare Other | Admitting: Internal Medicine

## 2020-04-27 VITALS — BP 135/67 | HR 70 | Temp 97.6°F | Ht 60.0 in | Wt 261.8 lb

## 2020-04-27 DIAGNOSIS — K859 Acute pancreatitis without necrosis or infection, unspecified: Secondary | ICD-10-CM | POA: Insufficient documentation

## 2020-04-27 DIAGNOSIS — R197 Diarrhea, unspecified: Secondary | ICD-10-CM

## 2020-04-27 DIAGNOSIS — Z86010 Personal history of colonic polyps: Secondary | ICD-10-CM | POA: Diagnosis not present

## 2020-04-27 DIAGNOSIS — K8500 Idiopathic acute pancreatitis without necrosis or infection: Secondary | ICD-10-CM | POA: Diagnosis not present

## 2020-04-27 NOTE — Patient Instructions (Signed)
We will schedule you for MRI to further evaluate your pancreas to exclude any structural abnormalities that may have caused her pancreatitis.  I will call you with these results.  At [facility] we value your feedback. You may receive a survey about your visit today. Please share your experience as we strive to create trusting relationships with our patients to provide genuine, compassionate, quality care.  We appreciate your understanding and patience as we review any laboratory studies, imaging, and other diagnostic tests that are ordered as we care for you. Our office policy is 5 business days for review of these results, and any emergent or urgent results are addressed in a timely manner for your best interest. If you do not hear from our office in 1 week, please contact us.   We also encourage the use of MyChart, which contains your medical information for your review as well. If you are not enrolled in this feature, an access code is on this after visit summary for your convenience. Thank you for allowing [place] to be involved in your care.  It was great to see you today!  I hope you have a great rest of your winter!!    Elon Alas. Abbey Chatters, D.O. Gastroenterology and Hepatology [facility]

## 2020-05-01 NOTE — Progress Notes (Signed)
Referring Provider: Romeo Rabon, MD Primary Care Physician:  Romeo Rabon, MD Primary GI:  Dr. Marletta Lor  Chief Complaint  Patient presents with  . Follow-up    Doing ok, still watching what she eats, has belching with some foods. Trying to loose weight. Has lost 30lbs so far.     HPI:   Paula [name] is a 70 y.o. female who presents to the clinic today for follow-up visit.  Previously seen after she was admitted to hospital in [facility] for acute pancreatitis.  I obtained records from that hospitalization and her work-up was relatively unremarkable.  Ultrasound noted gallbladder polyp otherwise no stones or sludge.  Her LFTs were elevated during that hospitalization however.  Triglyceride levels were normal.  CT imaging did not show any obvious pancreatic anomaly or mass.  Does have a history of mild alcohol use. Notes drinking 1 glass of wine most nights and occasional glass of scotch 1-2 times per month.  No previous episodes of pancreatitis in the past.  Also takes omeprazole and losartan but has been on both these for years.  Since her hospitalization, she is doing quite well.  No longer having pain.  Does note some diarrhea which is yellow.  I started her on pancreatic enzyme replacement therapy previously she states she cannot tolerate this.  Last colonoscopy 2020 was WNL though the colonoscopy in 2017 had numerous adenomatous colon polyps.  Past Medical History:  Diagnosis Date  . Anxiety   . Arthritis   . COPD (chronic obstructive pulmonary disease) ([facility])   . Hypertension   . Lung nodule    LLL  . Sleep apnea   . Tubal ectopic pregnancy 1970    Past Surgical History:  Procedure Laterality Date  . APPENDECTOMY  1957  . BREAST SURGERY  1969   gland removed  . COLONOSCOPY N/A 11/13/2015   Procedure: COLONOSCOPY;  Surgeon: West Bali, MD;  [facility];  Service: Endoscopy;  Laterality: N/A;  1030  . COLONOSCOPY N/A 02/15/2019   Procedure: COLONOSCOPY;   Surgeon: West Bali, MD;  [facility];  Service: Endoscopy;  Laterality: N/A;  8:30am  . ECTOPIC PREGNANCY SURGERY  1978    Current Outpatient Medications  Medication Sig Dispense Refill  . acetaminophen (TYLENOL) 500 MG tablet Take 1,000 mg by mouth as needed (for pain.).    Marland Kitchen albuterol (VENTOLIN HFA) 108 (90 Base) MCG/ACT inhaler Inhale into the lungs as needed for wheezing or shortness of breath.    Marland Kitchen arformoterol (BROVANA) 15 MCG/2ML NEBU Take 15 mcg by nebulization See admin instructions. Use 1 nebulizer (15 mcg) scheduled with lunch, may use an additional nebulizer in the evening if needed for respiratory issues.    Marland Kitchen azelastine (ASTELIN) 0.1 % nasal spray Place 2 sprays into both nostrils 2 (two) times daily. Use in each nostril as directed    . betamethasone dipropionate (DIPROLENE) 0.05 % cream Apply 1 application topically 2 (two) times daily as needed (SKIN IRRITATION).     Marland Kitchen budesonide-formoterol (SYMBICORT) 160-4.5 MCG/ACT inhaler Inhale 2 puffs into the lungs 2 (two) times daily.    . Cholecalciferol (VITAMIN D) 125 MCG (5000 UT) CAPS Take 5,000 Units by mouth daily.     . clotrimazole-betamethasone (LOTRISONE) cream Apply 1 application topically 2 (two) times daily as needed (skin irritation.).     Marland Kitchen diclofenac (VOLTAREN) 75 MG EC tablet Take 75 mg by mouth as needed (arthritis pain).    Marland Kitchen ezetimibe (ZETIA)  10 MG tablet Take 10 mg by mouth daily.    . famotidine (PEPCID) 40 MG tablet Take 40 mg by mouth at bedtime.    . fluticasone (FLONASE) 50 MCG/ACT nasal spray Place 2 sprays into both nostrils daily.     . hydrochlorothiazide (HYDRODIURIL) 25 MG tablet Take 25 mg by mouth daily.     Marland Kitchen ketoconazole (NIZORAL) 2 % cream Apply 1 application topically 2 (two) times daily as needed for irritation.    Marland Kitchen losartan (COZAAR) 50 MG tablet Take 100 mg by mouth daily.     . montelukast (SINGULAIR) 10 MG tablet Take 10 mg by mouth at bedtime.    . Multiple  Vitamins-Minerals (PRESERVISION AREDS) CAPS Take by mouth daily.    . NON FORMULARY CPAP, O2 at night    . omalizumab (XOLAIR) 150 MG injection Inject 225 mg into the skin every 14 (fourteen) days.    Marland Kitchen omeprazole (PRILOSEC) 40 MG capsule Take 40 mg by mouth daily.    . sertraline (ZOLOFT) 100 MG tablet Take 100 mg by mouth every evening.     . Multiple Vitamin (MULTIVITAMIN WITH MINERALS) TABS tablet Take 1 tablet by mouth daily. One-A-Day (Patient not taking: No sig reported)     No current facility-administered medications for this visit.    Allergies as of 04/27/2020 - Review Complete 04/27/2020  Allergen Reaction Noted  . Codeine Nausea Only 08/30/2014  . Statins  03/09/2020    Family History  Problem Relation Age of Onset  . Colon cancer Neg Hx     Social History   Socioeconomic History  . Marital status: Married    Spouse name: Not on file  . Number of children: Not on file  . Years of education: Not on file  . Highest education level: Not on file  Occupational History  . Not on file  Tobacco Use  . Smoking status: Former Smoker    Packs/day: 0.75    Years: 20.00    Pack years: 15.00    Types: Cigarettes  . Smokeless tobacco: Former Systems developer    Quit date: 10/31/1998  Vaping Use  . Vaping Use: Never used  Substance and Sexual Activity  . Alcohol use: Yes    Comment: glass of wine 4-5 times weekly  . Drug use: No  . Sexual activity: Not on file  Other Topics Concern  . Not on file  Social History Narrative  . Not on file   Social Determinants of Health   Financial Resource Strain: Not on file  Food Insecurity: Not on file  Transportation Needs: Not on file  Physical Activity: Not on file  Stress: Not on file  Social Connections: Not on file    Subjective: Review of Systems  Constitutional: Negative for chills and fever.  HENT: Negative for congestion and hearing loss.   Eyes: Negative for blurred vision and double vision.  Respiratory: Negative for  cough and shortness of breath.   Cardiovascular: Negative for chest pain and palpitations.  Gastrointestinal: Negative for abdominal pain, blood in stool, constipation, diarrhea, heartburn, melena and vomiting.  Genitourinary: Negative for dysuria and urgency.  Musculoskeletal: Negative for joint pain and myalgias.  Skin: Negative for itching and rash.  Neurological: Negative for dizziness and headaches.  Psychiatric/Behavioral: Negative for depression. The patient is not nervous/anxious.      Objective: BP 135/67   Pulse 70   Temp 97.6 F (36.4 C) (Temporal)   Ht 5' (1.524 m)   Wt 261 lb  12.8 oz (118.8 kg)   BMI 51.13 kg/m  Physical Exam Constitutional:      Appearance: Normal appearance. She is obese.  HENT:     Head: Normocephalic and atraumatic.  Eyes:     Extraocular Movements: Extraocular movements intact.     Conjunctiva/sclera: Conjunctivae normal.  Cardiovascular:     Rate and Rhythm: Normal rate and regular rhythm.  Pulmonary:     Effort: Pulmonary effort is normal.     Breath sounds: Normal breath sounds.  Abdominal:     General: Bowel sounds are normal.     Palpations: Abdomen is soft.  Musculoskeletal:        General: No swelling. Normal range of motion.     Cervical back: Normal range of motion and neck supple.  Skin:    General: Skin is warm and dry.     Coloration: Skin is not jaundiced.  Neurological:     General: No focal deficit present.     Mental Status: She is alert and oriented to person, place, and time.  Psychiatric:        Mood and Affect: Mood normal.        Behavior: Behavior normal.      Assessment: *Pancreatitis *History of adenomatous colon polyps *Abdominal pain *Gallbladder polyp  Plan: Etiology of patient's recent hospital admission for pancreatitis unclear.    Ultrasound showed gallbladder polyp no stones or sludge.  However she did have elevated LFTs so there is some concern for microlithiasis.  Mild alcohol use probably  not because of her pancreatitis.  Triglycerides normal.  Possible medication induced from omeprazole and/or losartan though she has been on these for years.  Given her age, I am going to check MRI/MRCP of her pancreas to rule out small tumors.  Pending results she may benefit from endoscopic ultrasound.  Colonoscopy recall 2025.  05/01/2020 12:08 PM   Disclaimer: This note was dictated with voice recognition software. Similar sounding words can inadvertently be transcribed and may not be corrected upon review.

## 2020-05-22 ENCOUNTER — Ambulatory Visit
Admission: RE | Admit: 2020-05-22 | Discharge: 2020-05-22 | Disposition: A | Discharge status: Home / Self Care | Payer: Medicare Other | Source: Ambulatory Visit | Attending: Internal Medicine | Admitting: Internal Medicine

## 2020-05-22 ENCOUNTER — Other Ambulatory Visit: Payer: Self-pay

## 2020-05-22 DIAGNOSIS — K8500 Idiopathic acute pancreatitis without necrosis or infection: Secondary | ICD-10-CM

## 2020-05-22 MED ORDER — GADOBENATE DIMEGLUMINE 529 MG/ML IV SOLN
20.0000 mL | Freq: Once | INTRAVENOUS | Status: AC | PRN
  Administered 2020-05-22: 20 mL via INTRAVENOUS

## 2020-05-25 ENCOUNTER — Other Ambulatory Visit: Payer: Self-pay | Admitting: *Deleted

## 2020-05-25 DIAGNOSIS — K819 Cholecystitis, unspecified: Secondary | ICD-10-CM

## 2020-05-25 DIAGNOSIS — K859 Acute pancreatitis without necrosis or infection, unspecified: Secondary | ICD-10-CM

## 2020-05-29 ENCOUNTER — Telehealth: Payer: Self-pay | Admitting: Internal Medicine

## 2020-05-29 NOTE — Telephone Encounter (Signed)
Pt has been referred to surgeon but no appt has been scheduled. She was referred last week.  Called pt, gave her phone# to MyChart help desk if needed.

## 2020-05-29 NOTE — Telephone Encounter (Signed)
Pt called to say that Dr Abbey Chatters was going to refer her to a surgeon regarding her gallbladder. She received a text from [place], but couldn't log into it because it was accepting her dob. She doesn't know what the message is and thought maybe it was her appointment with the surgeon. Please advise if she has been referred and to which doctor so she can call them. [phone]

## 2020-05-30 ENCOUNTER — Telehealth: Payer: Self-pay | Admitting: Internal Medicine

## 2020-05-30 NOTE — Telephone Encounter (Signed)
[phone]  PATIENT CALLED ASKING ABOUT HER REFERRAL TO A SURGEON.  SHE HAS NOT HEARD FROM ANYONE YET

## 2020-05-30 NOTE — Telephone Encounter (Signed)
Called pt and provided # to Dr. Fransisca Kaufmann office

## 2020-06-15 ENCOUNTER — Encounter (INDEPENDENT_AMBULATORY_CARE_PROVIDER_SITE_OTHER): Payer: Self-pay

## 2020-06-15 ENCOUNTER — Ambulatory Visit (INDEPENDENT_AMBULATORY_CARE_PROVIDER_SITE_OTHER): Payer: Medicare Other | Admitting: General Surgery

## 2020-06-15 ENCOUNTER — Other Ambulatory Visit: Payer: Self-pay

## 2020-06-15 ENCOUNTER — Encounter: Payer: Self-pay | Admitting: General Surgery

## 2020-06-15 VITALS — BP 131/75 | HR 66 | Temp 97.8°F | Resp 20 | Ht 60.0 in | Wt 251.0 lb

## 2020-06-15 DIAGNOSIS — K8020 Calculus of gallbladder without cholecystitis without obstruction: Secondary | ICD-10-CM

## 2020-06-15 NOTE — Patient Instructions (Signed)
Minimally Invasive Cholecystectomy Minimally invasive cholecystectomy is surgery to remove the gallbladder. The gallbladder is a pear-shaped organ that lies beneath the liver on the right side of the body. The gallbladder stores bile, which is a fluid that helps the body digest fats. Cholecystectomy is often done to treat inflammation of the gallbladder (cholecystitis). This condition is usually caused by a buildup of gallstones (cholelithiasis) in the gallbladder. Gallstones can block the flow of bile, which can result in inflammation and pain. In severe cases, emergency surgery may be required. This procedure is done though small incisions in the abdomen, instead of one large incision. It is also called laparoscopic surgery. A thin scope with a camera (laparoscope) is inserted through one incision. Then surgical instruments are inserted through the other incisions. In some cases, a minimally invasive surgery may need to be changed to a surgery that is done through a larger incision. This is called open surgery. Tell a health care provider about:  Any allergies you have.  All medicines you are taking, including vitamins, herbs, eye drops, creams, and over-the-counter medicines.  Any problems you or family members have had with anesthetic medicines.  Any blood disorders you have.  Any surgeries you have had.  Any medical conditions you have.  Whether you are pregnant or may be pregnant. What are the risks? Generally, this is a safe procedure. However, problems may occur, including:  Infection.  Bleeding.  Allergic reactions to medicines.  Damage to nearby structures or organs.  A stone remaining in the common bile duct. The common bile duct carries bile from the gallbladder into the small intestine.  A bile leak from the cyst duct that is clipped when your gallbladder is removed. What happens before the procedure? Medicines Ask your health care provider about:  Changing or  stopping your regular medicines. This is especially important if you are taking diabetes medicines or blood thinners.  Taking medicines such as aspirin and ibuprofen. These medicines can thin your blood. Do not take these medicines unless your health care provider tells you to take them.  Taking over-the-counter medicines, vitamins, herbs, and supplements. General instructions  Let your health care provider know if you develop a cold or an infection before surgery.  Plan to have someone take you home from the hospital or clinic.  If you will be going home right after the procedure, plan to have someone with you for 24 hours.  Ask your health care provider: ? How your surgery site will be marked. ? What steps will be taken to help prevent infection. These may include:  Removing hair at the surgery site.  Washing skin with a germ-killing soap.  Taking antibiotic medicine. What happens during the procedure?  An IV will be inserted into one of your veins.  You will be given one or both of the following: ? A medicine to help you relax (sedative). ? A medicine to make you fall asleep (general anesthetic).  A breathing tube will be placed in your mouth.  Your surgeon will make several small incisions in your abdomen.  The laparoscope will be inserted through one of the small incisions. The camera on the laparoscope will send images to a monitor in the operating room. This lets your surgeon see inside your abdomen.  A gas will be pumped into your abdomen. This will expand your abdomen to give the surgeon more room to perform the surgery.  Other tools that are needed for the procedure will be inserted through the   other incisions. The gallbladder will be removed through one of the incisions.  Your common bile duct may be examined. If stones are found in the common bile duct, they may be removed.  After your gallbladder has been removed, the incisions will be closed with stitches  (sutures), staples, or skin glue.  Your incisions may be covered with a bandage (dressing). The procedure may vary among health care providers and hospitals.   What happens after the procedure?  Your blood pressure, heart rate, breathing rate, and blood oxygen level will be monitored until you leave the hospital or clinic.  You will be given medicines as needed to control your pain.  If you were given a sedative during the procedure, it can affect you for several hours. Do not drive or operate machinery until your health care provider says that it is safe. Summary  Minimally invasive cholecystectomy, also called laparoscopic cholecystectomy, is surgery to remove the gallbladder using small incisions.  Tell your health care provider about all the medical conditions you have and all the medicines you are taking for those conditions.  Before the procedure, follow instructions about eating or drinking restrictions and changing or stopping medicines.  If you were given a sedative during the procedure, it can affect you for several hours. Do not drive or operate machinery until your health care provider says that it is safe. This information is not intended to replace advice given to you by your health care provider. Make sure you discuss any questions you have with your health care provider. Document Revised: 12/14/2018 Document Reviewed: 12/14/2018 Elsevier Patient Education  2021 [employer]  

## 2020-06-16 NOTE — Progress Notes (Signed)
Paula [name]; [id number]; March 01, 1951   HPI Patient is a 70 year old white female who was referred to my care by Dr. Abbey Chatters of gastroenterology for evaluation and treatment of cholelithiasis.  Patient has been followed by Dr. Abbey Chatters.  She apparently had an admission to a hospital in [place] for acute pancreatitis.  It was noted that her LFTs were elevated during that hospitalization.  CT scan imaging did not show any pancreatic pathology.  She was noted on ultrasound to have a gallbladder polyp but no stones or sludge.  He ordered an MRCP which revealed cholelithiasis.  She states she intermittently has right upper quadrant abdominal pain.  She denies any pain at the present time.  She does have intermittent bloating.  She denies any fever, chills, or jaundice. She does have a history of COPD, but this is well controlled with medications. Past Medical History:  Diagnosis Date  . Anxiety   . Arthritis   . COPD (chronic obstructive pulmonary disease) ([place])   . Hypertension   . Lung nodule    LLL  . Sleep apnea   . Tubal ectopic pregnancy 1970    Past Surgical History:  Procedure Laterality Date  . APPENDECTOMY  1957  . BREAST SURGERY  1969   gland removed  . COLONOSCOPY N/A 11/13/2015   Procedure: COLONOSCOPY;  Surgeon: Danie Binder, MD;  [facility];  Service: Endoscopy;  Laterality: N/A;  1030  . COLONOSCOPY N/A 02/15/2019   Procedure: COLONOSCOPY;  Surgeon: Danie Binder, MD;  [facility];  Service: Endoscopy;  Laterality: N/A;  8:30am  . ECTOPIC PREGNANCY SURGERY  1978    Family History  Problem Relation Age of Onset  . Colon cancer Neg Hx     Current Outpatient Medications on File Prior to Visit  Medication Sig Dispense Refill  . acetaminophen (TYLENOL) 500 MG tablet Take 1,000 mg by mouth every 6 (six) hours as needed for moderate pain.    Marland Kitchen albuterol (VENTOLIN HFA) 108 (90 Base) MCG/ACT inhaler Inhale 2 puffs into the lungs every 6 (six)  hours as needed for wheezing or shortness of breath.    Marland Kitchen arformoterol (BROVANA) 15 MCG/2ML NEBU Take 15 mcg by nebulization See admin instructions. Use 1 nebulizer (15 mcg) scheduled with lunch, may use an additional nebulizer in the evening if needed for respiratory issues.    Marland Kitchen azelastine (ASTELIN) 0.1 % nasal spray Place 2 sprays into both nostrils 2 (two) times daily. Use in each nostril as directed    . budesonide-formoterol (SYMBICORT) 160-4.5 MCG/ACT inhaler Inhale 2 puffs into the lungs 2 (two) times daily.    . Cholecalciferol (VITAMIN D) 125 MCG (5000 UT) CAPS Take 5,000 Units by mouth daily.     . clotrimazole-betamethasone (LOTRISONE) cream Apply 1 application topically 2 (two) times daily as needed (skin irritation.).     Marland Kitchen diclofenac (VOLTAREN) 75 MG EC tablet Take 75 mg by mouth as needed (arthritis pain).    Marland Kitchen ezetimibe (ZETIA) 10 MG tablet Take 10 mg by mouth daily.    . famotidine (PEPCID) 40 MG tablet Take 40 mg by mouth at bedtime.    . fluticasone (FLONASE) 50 MCG/ACT nasal spray Place 2 sprays into both nostrils daily.     . hydrochlorothiazide (HYDRODIURIL) 25 MG tablet Take 25 mg by mouth daily.     Marland Kitchen ketoconazole (NIZORAL) 2 % cream Apply 1 application topically 2 (two) times daily as needed for irritation.    . montelukast (  SINGULAIR) 10 MG tablet Take 10 mg by mouth at bedtime.    . Multiple Vitamins-Minerals (PRESERVISION AREDS) CAPS Take 1 capsule by mouth daily.    . NON FORMULARY CPAP, O2 at night    . omeprazole (PRILOSEC) 40 MG capsule Take 40 mg by mouth daily.    . sertraline (ZOLOFT) 100 MG tablet Take 100 mg by mouth every evening.      No current facility-administered medications on file prior to visit.    Allergies  Allergen Reactions  . Codeine Nausea Only  . Amlodipine     May have caused hypotension   . Statins     Pain in legs  . Tape     Leaves bruises   . Verapamil     May have caused hypotension     Social History   Substance and  Sexual Activity  Alcohol Use Yes   Comment: glass of wine 4-5 times weekly    Social History   Tobacco Use  Smoking Status Former Smoker  . Packs/day: 0.75  . Years: 20.00  . Pack years: 15.00  . Types: Cigarettes  Smokeless Tobacco Former Systems developer  . Quit date: 10/31/1998    Review of Systems  Constitutional: Negative.   HENT: Positive for sinus pain.   Eyes: Negative.   Respiratory: Positive for shortness of breath.   Cardiovascular: Negative.   Gastrointestinal: Positive for abdominal pain and heartburn.  Genitourinary: Negative.   Musculoskeletal: Positive for back pain, joint pain and neck pain.  Skin: Negative.   Neurological: Negative.   Endo/Heme/Allergies: Negative.   Psychiatric/Behavioral: Negative.     Objective   Vitals:   06/15/20 0916  BP: 131/75  Pulse: 66  Resp: 20  Temp: 97.8 F (36.6 C)  SpO2: 94%    Physical Exam Vitals reviewed.  Constitutional:      Appearance: Normal appearance. She is obese. She is not ill-appearing.  HENT:     Head: Normocephalic and atraumatic.  Eyes:     General: No scleral icterus. Cardiovascular:     Rate and Rhythm: Normal rate and regular rhythm.     Heart sounds: Normal heart sounds. No murmur heard. No friction rub. No gallop.   Pulmonary:     Effort: Pulmonary effort is normal. No respiratory distress.     Breath sounds: Normal breath sounds. No stridor. No wheezing, rhonchi or rales.  Abdominal:     General: Bowel sounds are normal. There is no distension.     Palpations: Abdomen is soft. There is no mass.     Tenderness: There is no abdominal tenderness. There is no guarding or rebound.     Hernia: No hernia is present.  Skin:    General: Skin is warm and dry.  Neurological:     Mental Status: She is alert and oriented to person, place, and time.    Labs from Dr. Layne Benton office reviewed.  LFTs and electrolytes within normal limits. Assessment  History of gallstone pancreatitis, COPD, morbid  obesity, hypertension Plan   I do recommend a laparoscopic cholecystectomy to prevent any further episodes of gallstone pancreatitis.  Patient understands and agrees.  She is at increased risk for surgical intervention given her morbid obesity and COPD.  The risks and benefits of the procedure including bleeding, infection, cardiopulmonary difficulties, hepatobiliary injury, and the possibility of an open procedure were fully explained to the patient, who gave informed consent.  She is scheduled for surgery on 06/23/2020.

## 2020-06-16 NOTE — H&P (Signed)
Paula [name]; [id number]; [birth year]   HPI Patient is a 70 year old white female who was referred to my care by Dr. Abbey Chatters of gastroenterology for evaluation and treatment of cholelithiasis.  Patient has been followed by Dr. Abbey Chatters.  She apparently had an admission to a hospital in [place] for acute pancreatitis.  It was noted that her LFTs were elevated during that hospitalization.  CT scan imaging did not show any pancreatic pathology.  She was noted on ultrasound to have a gallbladder polyp but no stones or sludge.  He ordered an MRCP which revealed cholelithiasis.  She states she intermittently has right upper quadrant abdominal pain.  She denies any pain at the present time.  She does have intermittent bloating.  She denies any fever, chills, or jaundice. She does have a history of COPD, but this is well controlled with medications. Past Medical History:  Diagnosis Date  . Anxiety   . Arthritis   . COPD (chronic obstructive pulmonary disease) ([place])   . Hypertension   . Lung nodule    LLL  . Sleep apnea   . Tubal ectopic pregnancy 1970    Past Surgical History:  Procedure Laterality Date  . APPENDECTOMY  1957  . BREAST SURGERY  1969   gland removed  . COLONOSCOPY N/A 11/13/2015   Procedure: COLONOSCOPY;  Surgeon: Danie Binder, MD;  [facility];  Service: Endoscopy;  Laterality: N/A;  1030  . COLONOSCOPY N/A 02/15/2019   Procedure: COLONOSCOPY;  Surgeon: Danie Binder, MD;  [facility];  Service: Endoscopy;  Laterality: N/A;  8:30am  . ECTOPIC PREGNANCY SURGERY  1978    Family History  Problem Relation Age of Onset  . Colon cancer Neg Hx     Current Outpatient Medications on File Prior to Visit  Medication Sig Dispense Refill  . acetaminophen (TYLENOL) 500 MG tablet Take 1,000 mg by mouth every 6 (six) hours as needed for moderate pain.    Marland Kitchen albuterol (VENTOLIN HFA) 108 (90 Base) MCG/ACT inhaler Inhale 2 puffs into the lungs every 6 (six)  hours as needed for wheezing or shortness of breath.    Marland Kitchen arformoterol (BROVANA) 15 MCG/2ML NEBU Take 15 mcg by nebulization See admin instructions. Use 1 nebulizer (15 mcg) scheduled with lunch, may use an additional nebulizer in the evening if needed for respiratory issues.    Marland Kitchen azelastine (ASTELIN) 0.1 % nasal spray Place 2 sprays into both nostrils 2 (two) times daily. Use in each nostril as directed    . budesonide-formoterol (SYMBICORT) 160-4.5 MCG/ACT inhaler Inhale 2 puffs into the lungs 2 (two) times daily.    . Cholecalciferol (VITAMIN D) 125 MCG (5000 UT) CAPS Take 5,000 Units by mouth daily.     . clotrimazole-betamethasone (LOTRISONE) cream Apply 1 application topically 2 (two) times daily as needed (skin irritation.).     Marland Kitchen diclofenac (VOLTAREN) 75 MG EC tablet Take 75 mg by mouth as needed (arthritis pain).    Marland Kitchen ezetimibe (ZETIA) 10 MG tablet Take 10 mg by mouth daily.    . famotidine (PEPCID) 40 MG tablet Take 40 mg by mouth at bedtime.    . fluticasone (FLONASE) 50 MCG/ACT nasal spray Place 2 sprays into both nostrils daily.     . hydrochlorothiazide (HYDRODIURIL) 25 MG tablet Take 25 mg by mouth daily.     Marland Kitchen ketoconazole (NIZORAL) 2 % cream Apply 1 application topically 2 (two) times daily as needed for irritation.    . montelukast (  SINGULAIR) 10 MG tablet Take 10 mg by mouth at bedtime.    . Multiple Vitamins-Minerals (PRESERVISION AREDS) CAPS Take 1 capsule by mouth daily.    . NON FORMULARY CPAP, O2 at night    . omeprazole (PRILOSEC) 40 MG capsule Take 40 mg by mouth daily.    . sertraline (ZOLOFT) 100 MG tablet Take 100 mg by mouth every evening.      No current facility-administered medications on file prior to visit.    Allergies  Allergen Reactions  . Codeine Nausea Only  . Amlodipine     May have caused hypotension   . Statins     Pain in legs  . Tape     Leaves bruises   . Verapamil     May have caused hypotension     Social History   Substance and  Sexual Activity  Alcohol Use Yes   Comment: glass of wine 4-5 times weekly    Social History   Tobacco Use  Smoking Status Former Smoker  . Packs/day: 0.75  . Years: 20.00  . Pack years: 15.00  . Types: Cigarettes  Smokeless Tobacco Former Systems developer  . Quit date: 10/31/1998    Review of Systems  Constitutional: Negative.   HENT: Positive for sinus pain.   Eyes: Negative.   Respiratory: Positive for shortness of breath.   Cardiovascular: Negative.   Gastrointestinal: Positive for abdominal pain and heartburn.  Genitourinary: Negative.   Musculoskeletal: Positive for back pain, joint pain and neck pain.  Skin: Negative.   Neurological: Negative.   Endo/Heme/Allergies: Negative.   Psychiatric/Behavioral: Negative.     Objective   Vitals:   06/15/20 0916  BP: 131/75  Pulse: 66  Resp: 20  Temp: 97.8 F (36.6 C)  SpO2: 94%    Physical Exam Vitals reviewed.  Constitutional:      Appearance: Normal appearance. She is obese. She is not ill-appearing.  HENT:     Head: Normocephalic and atraumatic.  Eyes:     General: No scleral icterus. Cardiovascular:     Rate and Rhythm: Normal rate and regular rhythm.     Heart sounds: Normal heart sounds. No murmur heard. No friction rub. No gallop.   Pulmonary:     Effort: Pulmonary effort is normal. No respiratory distress.     Breath sounds: Normal breath sounds. No stridor. No wheezing, rhonchi or rales.  Abdominal:     General: Bowel sounds are normal. There is no distension.     Palpations: Abdomen is soft. There is no mass.     Tenderness: There is no abdominal tenderness. There is no guarding or rebound.     Hernia: No hernia is present.  Skin:    General: Skin is warm and dry.  Neurological:     Mental Status: She is alert and oriented to person, place, and time.    Labs from Dr. Layne Benton office reviewed.  LFTs and electrolytes within normal limits. Assessment  History of gallstone pancreatitis, COPD, morbid  obesity, hypertension Plan   I do recommend a laparoscopic cholecystectomy to prevent any further episodes of gallstone pancreatitis.  Patient understands and agrees.  She is at increased risk for surgical intervention given her morbid obesity and COPD.  The risks and benefits of the procedure including bleeding, infection, cardiopulmonary difficulties, hepatobiliary injury, and the possibility of an open procedure were fully explained to the patient, who gave informed consent.  She is scheduled for surgery on 06/23/2020.

## 2020-06-20 NOTE — Patient Instructions (Signed)
Paula [name]  06/20/2020     @PREFPERIOPPHARMACY @   Your procedure is scheduled on  06/23/2020   Report to [facility] at  [place].M.   Call this number if you have problems the morning of surgery:  [phone]   Remember:  Do not eat or drink after midnight.                        Take these medicines the morning of surgery with A SIP OF WATER  Voltaren, claritin. Prilosec.  Use your nebulizer and your inhalers before you come. Bring your rescue inhaler with you.        Do not wear jewelry, make-up or nail polish.  Do not wear lotions, powders, or perfumes, or deodorant.  Do not shave 48 hours prior to surgery.  Men may shave face and neck.  Do not bring valuables to the hospital.  [facility] is not responsible for any belongings or valuables.    Contacts, dentures or bridgework may not be worn into surgery.  Leave your suitcase in the car.  After surgery it may be brought to your room.  For patients admitted to the hospital, discharge time will be determined by your treatment team.  Patients discharged the day of surgery will not be allowed to drive home and must have someone with them for 24 hours.  Place clean sheets on your bed the night before your procedure. DO NOT sleep with pets this night.  Shower with CHG the night before and the morning of your procedure. DO NOT put CHG on your face, hair or genitals.  After each shower, dry off with a clean towel, put on clean, comfortable clothes and brush your teeth.   Special instructions:  DO NOT smoke tobacco or vape the morning of your procedure.   Please read over the following fact sheets that you were given. Coughing and Deep Breathing, Surgical Site Infection Prevention, Anesthesia Post-op Instructions and Care and Recovery After Surgery       Minimally Invasive Cholecystectomy, Care After This sheet gives you information about how to care for yourself after your procedure. Your health care  provider may also give you more specific instructions. If you have problems or questions, contact your health care provider. What can I expect after the procedure? After the procedure, it is common to have:  Pain at your incision sites. You will be given medicines to control this pain.  Mild nausea or vomiting.  Bloating and possible shoulder pain from the gas that was used during the procedure. Follow these instructions at home: Medicines  Take over-the-counter and prescription medicines only as told by your health care provider.  If you were prescribed an antibiotic medicine, take or use it as told by your health care provider. Do not stop using the antibiotic even if you start to feel better.  Ask your health care provider if the medicine prescribed to you: ? Requires you to avoid driving or using machinery. ? Can cause constipation. You may need to take these actions to prevent or treat constipation:  Drink enough fluid to keep your urine pale yellow.  Take over-the-counter or prescription medicines.  Eat foods that are high in fiber, such as beans, whole grains, and fresh fruits and vegetables.  Limit foods that are high in fat and processed sugars, such as fried or sweet foods. Incision care  Follow instructions from your health care  provider about how to take care of your incisions. Make sure you: ? Wash your hands with soap and water for at least 20 seconds before and after you change your bandage (dressing). If soap and water are not available, use hand sanitizer. ? Change your dressing as told by your health care provider. ? Leave stitches (sutures), skin glue, or adhesive strips in place. These skin closures may need to be in place for 2 weeks or longer. If adhesive strip edges start to loosen and curl up, you may trim the loose edges. Do not remove adhesive strips completely unless your health care provider tells you to do that.  Do not take baths, swim, or use a hot tub  until your health care provider approves. Ask your health care provider if you may take showers. You may only be allowed to take sponge baths.  Check your incision area every day for signs of infection. Check for: ? More redness, swelling, or pain. ? Fluid or blood. ? Warmth. ? Pus or a bad smell.   Activity  Rest as told by your health care provider.  Avoid sitting for a long time without moving. Get up to take short walks every 1-2 hours. This is important to improve blood flow and breathing. Ask for help if you feel weak or unsteady.  Do not lift anything that is heavier than 10 lb (4.5 kg), or the limit that you are told, until your health care provider says that it is safe.  Do not play contact sports until your health care provider approves.  Do not return to work or school until your health care provider approves.  Return to your normal activities as told by your health care provider. Ask your health care provider what activities are safe for you. General instructions  If you were given a sedative during the procedure, it can affect you for several hours. Do not drive or operate machinery until your health care provider says that it is safe.  Keep all follow-up visits as told by your health care provider. This is important. Contact a health care provider if:  You develop a rash.  You have more redness, swelling, or pain around your incisions.  You have fluid or blood coming from your incisions.  Your incisions feel warm to the touch.  You have pus or a bad smell coming from your incisions.  You have a fever.  One or more of your incisions breaks open. Get help right away if:  You have trouble breathing.  You have chest pain.  You have increasing pain in your shoulders.  You faint or feel dizzy when you stand.  You have severe pain in your abdomen.  You have nausea or vomiting that lasts for more than one day.  You have leg pain. Summary  After your  procedure, it is common to have pain at the incision sites. You may also have nausea or bloating.  Follow your health care provider's instructions about medicine, activity restrictions, and caring for your incision areas. Do not do activities that require a lot of effort.  Contact a health care provider if you have a fever or other signs of infection, such as more redness, swelling, or pain around the incisions.  Get help right away if you have chest pain, increasing pain in the shoulders, or trouble breathing. This information is not intended to replace advice given to you by your health care provider. Make sure you discuss any questions you have with  your health care provider. Document Revised: 12/09/2018 Document Reviewed: 12/09/2018 Elsevier Patient Education  2021 [place] Anesthesia, Adult, Care After This sheet gives you information about how to care for yourself after your procedure. Your health care provider may also give you more specific instructions. If you have problems or questions, contact your health care provider. What can I expect after the procedure? After the procedure, the following side effects are common:  Pain or discomfort at the IV site.  Nausea.  Vomiting.  Sore throat.  Trouble concentrating.  Feeling cold or chills.  Feeling weak or tired.  Sleepiness and fatigue.  Soreness and body aches. These side effects can affect parts of the body that were not involved in surgery. Follow these instructions at home: For the time period you were told by your health care provider:  Rest.  Do not participate in activities where you could fall or become injured.  Do not drive or use machinery.  Do not drink alcohol.  Do not take sleeping pills or medicines that cause drowsiness.  Do not make important decisions or sign legal documents.  Do not take care of children on your own.   Eating and drinking  Follow any instructions from your  health care provider about eating or drinking restrictions.  When you feel hungry, start by eating small amounts of foods that are soft and easy to digest (bland), such as toast. Gradually return to your regular diet.  Drink enough fluid to keep your urine pale yellow.  If you vomit, rehydrate by drinking water, juice, or clear broth. General instructions  If you have sleep apnea, surgery and certain medicines can increase your risk for breathing problems. Follow instructions from your health care provider about wearing your sleep device: ? Anytime you are sleeping, including during daytime naps. ? While taking prescription pain medicines, sleeping medicines, or medicines that make you drowsy.  Have a responsible adult stay with you for the time you are told. It is important to have someone help care for you until you are awake and alert.  Return to your normal activities as told by your health care provider. Ask your health care provider what activities are safe for you.  Take over-the-counter and prescription medicines only as told by your health care provider.  If you smoke, do not smoke without supervision.  Keep all follow-up visits as told by your health care provider. This is important. Contact a health care provider if:  You have nausea or vomiting that does not get better with medicine.  You cannot eat or drink without vomiting.  You have pain that does not get better with medicine.  You are unable to pass urine.  You develop a skin rash.  You have a fever.  You have redness around your IV site that gets worse. Get help right away if:  You have difficulty breathing.  You have chest pain.  You have blood in your urine or stool, or you vomit blood. Summary  After the procedure, it is common to have a sore throat or nausea. It is also common to feel tired.  Have a responsible adult stay with you for the time you are told. It is important to have someone help care  for you until you are awake and alert.  When you feel hungry, start by eating small amounts of foods that are soft and easy to digest (bland), such as toast. Gradually return to your regular diet.  Drink enough fluid to  keep your urine pale yellow.  Return to your normal activities as told by your health care provider. Ask your health care provider what activities are safe for you. This information is not intended to replace advice given to you by your health care provider. Make sure you discuss any questions you have with your health care provider. Document Revised: 11/25/2019 Document Reviewed: 06/24/2019 Elsevier Patient Education  2021 [employer].

## 2020-06-21 ENCOUNTER — Other Ambulatory Visit (HOSPITAL_COMMUNITY)
Admission: RE | Admit: 2020-06-21 | Discharge: 2020-06-21 | Disposition: A | Discharge status: Home / Self Care | Payer: Medicare Other | Source: Ambulatory Visit | Attending: General Surgery | Admitting: General Surgery

## 2020-06-21 ENCOUNTER — Other Ambulatory Visit: Payer: Self-pay

## 2020-06-21 ENCOUNTER — Encounter (HOSPITAL_COMMUNITY): Payer: Self-pay

## 2020-06-21 ENCOUNTER — Encounter (HOSPITAL_COMMUNITY)
Admission: RE | Admit: 2020-06-21 | Discharge: 2020-06-21 | Disposition: A | Discharge status: Home / Self Care | Payer: Medicare Other | Source: Ambulatory Visit | Attending: General Surgery | Admitting: General Surgery

## 2020-06-21 DIAGNOSIS — Z20822 Contact with and (suspected) exposure to covid-19: Secondary | ICD-10-CM | POA: Diagnosis not present

## 2020-06-21 DIAGNOSIS — Z01812 Encounter for preprocedural laboratory examination: Secondary | ICD-10-CM | POA: Insufficient documentation

## 2020-06-21 HISTORY — DX: Cardiac murmur, unspecified: R01.1

## 2020-06-21 HISTORY — DX: Dystrophies primarily involving the retinal pigment epithelium: H35.54

## 2020-06-21 LAB — COMPREHENSIVE METABOLIC PANEL
ALT: 24 U/L (ref 0–44)
AST: 21 U/L (ref 15–41)
Albumin: 3.7 g/dL (ref 3.5–5.0)
Alkaline Phosphatase: 81 U/L (ref 38–126)
Anion gap: 10 (ref 5–15)
BUN: 13 mg/dL (ref 8–23)
CO2: 26 mmol/L (ref 22–32)
Calcium: 9.3 mg/dL (ref 8.9–10.3)
Chloride: 102 mmol/L (ref 98–111)
Creatinine, Ser: 0.85 mg/dL (ref 0.44–1.00)
GFR, Estimated: >60 mL/min (ref >60)
Glucose, Bld: 95 mg/dL (ref 70–99)
Potassium: 3.5 mmol/L (ref 3.5–5.1)
Sodium: 138 mmol/L (ref 135–145)
Total Bilirubin: 0.4 mg/dL (ref 0.3–1.2)
Total Protein: 7.5 g/dL (ref 6.5–8.1)

## 2020-06-21 LAB — CBC WITH DIFFERENTIAL/PLATELET
Abs Immature Granulocytes: 0.01 10*3/uL (ref 0.00–0.07)
Basophils Absolute: 0.1 10*3/uL (ref 0.0–0.1)
Basophils Relative: 1 %
Eosinophils Absolute: 0.1 10*3/uL (ref 0.0–0.5)
Eosinophils Relative: 2 %
HCT: 38.6 % (ref 36.0–46.0)
Hemoglobin: 12.5 g/dL (ref 12.0–15.0)
Immature Granulocytes: 0 %
Lymphocytes Relative: 40 %
Lymphs Abs: 2.8 10*3/uL (ref 0.7–4.0)
MCH: 30.7 pg (ref 26.0–34.0)
MCHC: 32.4 g/dL (ref 30.0–36.0)
MCV: 94.8 fL (ref 80.0–100.0)
Monocytes Absolute: 0.6 10*3/uL (ref 0.1–1.0)
Monocytes Relative: 9 %
Neutro Abs: 3.5 10*3/uL (ref 1.7–7.7)
Neutrophils Relative %: 48 %
Platelets: 276 10*3/uL (ref 150–400)
RBC: 4.07 MIL/uL (ref 3.87–5.11)
RDW: 13.6 % (ref 11.5–15.5)
WBC: 7.1 10*3/uL (ref 4.0–10.5)
nRBC: 0 % (ref 0.0–0.2)

## 2020-06-21 LAB — SARS CORONAVIRUS 2 (TAT 6-24 HRS): SARS Coronavirus 2: NEGATIVE

## 2020-06-21 NOTE — Progress Notes (Signed)
Dr Charna Elizabeth reviewed EKG and patient history.  No orders given.

## 2020-06-23 ENCOUNTER — Encounter (HOSPITAL_COMMUNITY)
Admission: RE | Disposition: A | Discharge status: Home / Self Care | Payer: Self-pay | Source: Home / Self Care | Attending: General Surgery

## 2020-06-23 ENCOUNTER — Ambulatory Visit (HOSPITAL_COMMUNITY)
Admission: RE | Admit: 2020-06-23 | Discharge: 2020-06-23 | Disposition: A | Discharge status: Home / Self Care | Payer: Medicare Other | Attending: General Surgery | Admitting: General Surgery

## 2020-06-23 ENCOUNTER — Ambulatory Visit (HOSPITAL_COMMUNITY): Payer: Medicare Other | Admitting: Anesthesiology

## 2020-06-23 ENCOUNTER — Encounter (HOSPITAL_COMMUNITY): Payer: Self-pay | Admitting: General Surgery

## 2020-06-23 DIAGNOSIS — K851 Biliary acute pancreatitis without necrosis or infection: Secondary | ICD-10-CM

## 2020-06-23 DIAGNOSIS — K828 Other specified diseases of gallbladder: Secondary | ICD-10-CM | POA: Diagnosis not present

## 2020-06-23 DIAGNOSIS — Z87891 Personal history of nicotine dependence: Secondary | ICD-10-CM | POA: Insufficient documentation

## 2020-06-23 DIAGNOSIS — Z91048 Other nonmedicinal substance allergy status: Secondary | ICD-10-CM | POA: Diagnosis not present

## 2020-06-23 DIAGNOSIS — Z7951 Long term (current) use of inhaled steroids: Secondary | ICD-10-CM | POA: Insufficient documentation

## 2020-06-23 DIAGNOSIS — J449 Chronic obstructive pulmonary disease, unspecified: Secondary | ICD-10-CM | POA: Insufficient documentation

## 2020-06-23 DIAGNOSIS — Z885 Allergy status to narcotic agent status: Secondary | ICD-10-CM | POA: Insufficient documentation

## 2020-06-23 DIAGNOSIS — Z79899 Other long term (current) drug therapy: Secondary | ICD-10-CM | POA: Diagnosis not present

## 2020-06-23 DIAGNOSIS — Z791 Long term (current) use of non-steroidal anti-inflammatories (NSAID): Secondary | ICD-10-CM | POA: Insufficient documentation

## 2020-06-23 DIAGNOSIS — K8010 Calculus of gallbladder with chronic cholecystitis without obstruction: Secondary | ICD-10-CM | POA: Insufficient documentation

## 2020-06-23 DIAGNOSIS — Z888 Allergy status to other drugs, medicaments and biological substances status: Secondary | ICD-10-CM | POA: Diagnosis not present

## 2020-06-23 HISTORY — PX: CHOLECYSTECTOMY: SHX55

## 2020-06-23 SURGERY — LAPAROSCOPIC CHOLECYSTECTOMY
Anesthesia: General | Site: Abdomen

## 2020-06-23 MED ORDER — PROPOFOL 10 MG/ML IV BOLUS
INTRAVENOUS | Status: DC | PRN
  Administered 2020-06-23: 180 mg via INTRAVENOUS

## 2020-06-23 MED ORDER — SODIUM CHLORIDE 0.9 % IR SOLN
Status: DC | PRN
  Administered 2020-06-23: 1000 mL

## 2020-06-23 MED ORDER — FENTANYL CITRATE (PF) 100 MCG/2ML IJ SOLN
INTRAMUSCULAR | Status: AC
  Filled 2020-06-23: qty 2

## 2020-06-23 MED ORDER — SCOPOLAMINE 1 MG/3DAYS TD PT72
1.0000 | MEDICATED_PATCH | TRANSDERMAL | Status: DC
  Administered 2020-06-23: 1.5 mg via TRANSDERMAL

## 2020-06-23 MED ORDER — EPHEDRINE SULFATE-NACL 50-0.9 MG/10ML-% IV SOSY
PREFILLED_SYRINGE | INTRAVENOUS | Status: DC | PRN
  Administered 2020-06-23: 15 mg via INTRAVENOUS

## 2020-06-23 MED ORDER — ALBUTEROL SULFATE HFA 108 (90 BASE) MCG/ACT IN AERS
INHALATION_SPRAY | RESPIRATORY_TRACT | Status: AC
  Filled 2020-06-23: qty 6.7

## 2020-06-23 MED ORDER — ORAL CARE MOUTH RINSE: 15.0000 mL | Freq: Once | OROMUCOSAL | Status: AC

## 2020-06-23 MED ORDER — CHLORHEXIDINE GLUCONATE 0.12 % MT SOLN
15.0000 mL | Freq: Once | OROMUCOSAL | Status: AC
  Administered 2020-06-23: 15 mL via OROMUCOSAL
  Filled 2020-06-23: qty 15

## 2020-06-23 MED ORDER — ROCURONIUM BROMIDE 10 MG/ML (PF) SYRINGE
PREFILLED_SYRINGE | INTRAVENOUS | Status: AC
  Filled 2020-06-23: qty 20

## 2020-06-23 MED ORDER — ROCURONIUM BROMIDE 10 MG/ML (PF) SYRINGE
PREFILLED_SYRINGE | INTRAVENOUS | Status: DC | PRN
  Administered 2020-06-23: 60 mg via INTRAVENOUS

## 2020-06-23 MED ORDER — LACTATED RINGERS IV SOLN: INTRAVENOUS | Status: DC

## 2020-06-23 MED ORDER — PROPOFOL 10 MG/ML IV BOLUS
INTRAVENOUS | Status: AC
  Filled 2020-06-23: qty 20

## 2020-06-23 MED ORDER — SCOPOLAMINE 1 MG/3DAYS TD PT72
MEDICATED_PATCH | TRANSDERMAL | Status: AC
  Filled 2020-06-23: qty 1

## 2020-06-23 MED ORDER — SUCCINYLCHOLINE CHLORIDE 200 MG/10ML IV SOSY
PREFILLED_SYRINGE | INTRAVENOUS | Status: AC
  Filled 2020-06-23: qty 10

## 2020-06-23 MED ORDER — LIDOCAINE HCL (PF) 2 % IJ SOLN
INTRAMUSCULAR | Status: AC
  Filled 2020-06-23: qty 10

## 2020-06-23 MED ORDER — DEXAMETHASONE SODIUM PHOSPHATE 10 MG/ML IJ SOLN
INTRAMUSCULAR | Status: DC | PRN
  Administered 2020-06-23: 10 mg via INTRAVENOUS

## 2020-06-23 MED ORDER — ONDANSETRON HCL 4 MG/2ML IJ SOLN
INTRAMUSCULAR | Status: DC | PRN
  Administered 2020-06-23: 4 mg via INTRAVENOUS

## 2020-06-23 MED ORDER — CEFAZOLIN SODIUM-DEXTROSE 2-4 GM/100ML-% IV SOLN
2.0000 g | INTRAVENOUS | Status: AC
  Administered 2020-06-23: 2 g via INTRAVENOUS
  Filled 2020-06-23: qty 100

## 2020-06-23 MED ORDER — BUPIVACAINE LIPOSOME 1.3 % IJ SUSP
INTRAMUSCULAR | Status: AC
  Filled 2020-06-23: qty 10

## 2020-06-23 MED ORDER — ONDANSETRON HCL 4 MG/2ML IJ SOLN
INTRAMUSCULAR | Status: AC
  Filled 2020-06-23: qty 2

## 2020-06-23 MED ORDER — BUPIVACAINE LIPOSOME 1.3 % IJ SUSP
INTRAMUSCULAR | Status: DC | PRN
  Administered 2020-06-23: 20 mL

## 2020-06-23 MED ORDER — KETOROLAC TROMETHAMINE 30 MG/ML IJ SOLN
15.0000 mg | Freq: Once | INTRAMUSCULAR | Status: DC
  Filled 2020-06-23: qty 1

## 2020-06-23 MED ORDER — FENTANYL CITRATE (PF) 100 MCG/2ML IJ SOLN
25.0000 ug | INTRAMUSCULAR | Status: DC | PRN
  Administered 2020-06-23: 50 ug via INTRAVENOUS
  Filled 2020-06-23: qty 2

## 2020-06-23 MED ORDER — CHLORHEXIDINE GLUCONATE CLOTH 2 % EX PADS: 6.0000 | MEDICATED_PAD | Freq: Once | CUTANEOUS | Status: DC

## 2020-06-23 MED ORDER — GLYCOPYRROLATE PF 0.2 MG/ML IJ SOSY
PREFILLED_SYRINGE | INTRAMUSCULAR | Status: DC | PRN
  Administered 2020-06-23 (×2): .2 mg via INTRAVENOUS

## 2020-06-23 MED ORDER — EPHEDRINE 5 MG/ML INJ
INTRAVENOUS | Status: AC
  Filled 2020-06-23: qty 10

## 2020-06-23 MED ORDER — DEXAMETHASONE SODIUM PHOSPHATE 10 MG/ML IJ SOLN
INTRAMUSCULAR | Status: AC
  Filled 2020-06-23: qty 1

## 2020-06-23 MED ORDER — HEMOSTATIC AGENTS (NO CHARGE) OPTIME
TOPICAL | Status: DC | PRN
  Administered 2020-06-23: 1 via TOPICAL

## 2020-06-23 MED ORDER — TRAMADOL HCL 50 MG PO TABS: 50.0000 mg | ORAL_TABLET | Freq: Four times a day (QID) | ORAL | 0 refills | Status: DC | PRN

## 2020-06-23 MED ORDER — SUGAMMADEX SODIUM 200 MG/2ML IV SOLN
INTRAVENOUS | Status: DC | PRN
  Administered 2020-06-23: 400 mg via INTRAVENOUS

## 2020-06-23 MED ORDER — FENTANYL CITRATE (PF) 250 MCG/5ML IJ SOLN
INTRAMUSCULAR | Status: DC | PRN
  Administered 2020-06-23 (×2): 50 ug via INTRAVENOUS

## 2020-06-23 MED ORDER — ONDANSETRON HCL 4 MG PO TABS: 4.0000 mg | ORAL_TABLET | Freq: Three times a day (TID) | ORAL | 1 refills | Status: DC | PRN

## 2020-06-23 MED ORDER — ARTIFICIAL TEARS OPHTHALMIC OINT
TOPICAL_OINTMENT | OPHTHALMIC | Status: AC
  Filled 2020-06-23: qty 3.5

## 2020-06-23 MED ORDER — ONDANSETRON HCL 4 MG/2ML IJ SOLN: 4.0000 mg | Freq: Once | INTRAMUSCULAR | Status: DC | PRN

## 2020-06-23 MED ORDER — LIDOCAINE 2% (20 MG/ML) 5 ML SYRINGE
INTRAMUSCULAR | Status: DC | PRN
  Administered 2020-06-23: 60 mg via INTRAVENOUS

## 2020-06-23 SURGICAL SUPPLY — 42 items
ADH SKN CLS APL DERMABOND .7 (GAUZE/BANDAGES/DRESSINGS) ×1
APL SRG 38 LTWT LNG FL B (MISCELLANEOUS)
APPLICATOR ARISTA FLEXITIP XL (MISCELLANEOUS) IMPLANT
APPLIER CLIP ROT 10 11.4 M/L (STAPLE) ×2
APR CLP MED LRG 11.4X10 (STAPLE) ×1
BAG RETRIEVAL 10 (BASKET) ×1
CLIP APPLIE ROT 10 11.4 M/L (STAPLE) ×1 IMPLANT
CLOTH BEACON ORANGE TIMEOUT ST (SAFETY) ×2 IMPLANT
COVER LIGHT HANDLE STERIS (MISCELLANEOUS) ×4 IMPLANT
COVER WAND RF STERILE (DRAPES) ×2 IMPLANT
DERMABOND ADVANCED (GAUZE/BANDAGES/DRESSINGS) ×1
DERMABOND ADVANCED .7 DNX12 (GAUZE/BANDAGES/DRESSINGS) ×1 IMPLANT
DURAPREP 26ML APPLICATOR (WOUND CARE) ×2 IMPLANT
ELECT REM PT RETURN 9FT ADLT (ELECTROSURGICAL) ×2
ELECTRODE REM PT RTRN 9FT ADLT (ELECTROSURGICAL) ×1 IMPLANT
GLOVE SURG SS PI 7.5 STRL IVOR (GLOVE) ×2 IMPLANT
GLOVE SURG UNDER POLY LF SZ7 (GLOVE) ×4 IMPLANT
GOWN STRL REUS W/TWL LRG LVL3 (GOWN DISPOSABLE) ×6 IMPLANT
HEMOSTAT ARISTA ABSORB 3G PWDR (HEMOSTASIS) IMPLANT
HEMOSTAT SNOW SURGICEL 2X4 (HEMOSTASIS) ×2 IMPLANT
INST SET LAPROSCOPIC AP (KITS) ×2 IMPLANT
KIT TURNOVER KIT A (KITS) ×2 IMPLANT
MANIFOLD NEPTUNE II (INSTRUMENTS) ×2 IMPLANT
NEEDLE HYPO 18GX1.5 BLUNT FILL (NEEDLE) ×2 IMPLANT
NEEDLE HYPO 21X1.5 SAFETY (NEEDLE) ×2 IMPLANT
NEEDLE INSUFFLATION 14GA 120MM (NEEDLE) ×2 IMPLANT
NS IRRIG 1000ML POUR BTL (IV SOLUTION) ×2 IMPLANT
PACK LAP CHOLE LZT030E (CUSTOM PROCEDURE TRAY) ×2 IMPLANT
PAD ARMBOARD 7.5X6 YLW CONV (MISCELLANEOUS) ×2 IMPLANT
SET BASIN LINEN APH (SET/KITS/TRAYS/PACK) ×2 IMPLANT
SET TUBE SMOKE EVAC HIGH FLOW (TUBING) ×2 IMPLANT
SLEEVE ENDOPATH XCEL 5M (ENDOMECHANICALS) ×2 IMPLANT
SUT MNCRL AB 4-0 PS2 18 (SUTURE) ×4 IMPLANT
SUT VICRYL 0 UR6 27IN ABS (SUTURE) ×2 IMPLANT
SYR 20ML LL LF (SYRINGE) ×4 IMPLANT
SYS BAG RETRIEVAL 10MM (BASKET) ×1
SYSTEM BAG RETRIEVAL 10MM (BASKET) ×1 IMPLANT
TROCAR ENDO BLADELESS 11MM (ENDOMECHANICALS) ×2 IMPLANT
TROCAR XCEL NON-BLD 5MMX100MML (ENDOMECHANICALS) ×2 IMPLANT
TROCAR XCEL UNIV SLVE 11M 100M (ENDOMECHANICALS) ×2 IMPLANT
TUBE CONNECTING 12X1/4 (SUCTIONS) ×2 IMPLANT
WARMER LAPAROSCOPE (MISCELLANEOUS) ×2 IMPLANT

## 2020-06-23 NOTE — Op Note (Signed)
Patient:  Paula [name]  DOB:  Nov 28, 1950  MRN:  [id number]   Preop Diagnosis: Gallstone pancreatitis  Postop Diagnosis: Same  Procedure: Laparoscopic cholecystectomy  Surgeon: Aviva Signs, MD  Anes: General endotracheal  Indications: Patient is a 70 year old white female who was diagnosed with gallstone pancreatitis at an outside facility.  She now presents for laparoscopic cholecystectomy.  The risks and benefits of the procedure including bleeding, infection, hepatobiliary injury, and the possibility of an open procedure were fully explained to the patient, who gave informed consent.  Procedure note: The patient was placed in supine position.  After induction of general endotracheal anesthesia, the abdomen was prepped and draped using the usual sterile technique with ChloraPrep.  Surgical site confirmation was performed.  A supraumbilical incision was made down to the fascia.  A Veress needle was introduced into the abdominal cavity and confirmation of placement was done using the saline drop test.  The abdomen was then insufflated to 15 mmHg pressure.  An 11 mm trocar was introduced into the abdominal cavity under direct visualization without difficulty.  Patient was placed in reverse Trendelenburg position and an additional 11 mm trocar was placed in the epigastric region and 5 mm trochars were placed the right upper quadrant and right flank regions.  Liver was inspected and multiple adhesions from the liver to the abdominal wall were observed.  These were lysed sharply using EndoShears.  The gallbladder was then retracted in a dynamic fashion in order to provide a critical view of the triangle of Calot.  The cystic duct was first identified.  Its juncture to the infundibulum was fully identified.  Endoclips were placed proximally and distally on the cystic duct, and the cystic duct was divided.  This was likewise done to the cystic artery.  The gallbladder was freed away from the  gallbladder fossa using Bovie electrocautery.  The gallbladder was delivered through the epigastric trocar site using an Endo Catch bag.  The gallbladder fossa was inspected and no abnormal bleeding or bile leakage was noted.  Surgicel was placed on the gallbladder fossa.  All fluid and air were then evacuated from the abdominal cavity prior to the removal of the trochars.  All wounds were irrigated with normal saline.  All wounds were injected with Exparel.  All incisions were closed using a 4-0 Monocryl subcuticular suture.  Dermabond was applied.  All tape and needle counts were correct at the end of the procedure.  The patient was extubated in the operating room and transferred to PACU in stable condition.  Complications: None  EBL: Minimal  Specimen: Gallbladder

## 2020-06-23 NOTE — Anesthesia Postprocedure Evaluation (Signed)
Anesthesia Post Note  Patient: Paula [name]  Procedure(s) Performed: LAPAROSCOPIC CHOLECYSTECTOMY (N/A Abdomen)  Anesthesia Type: General Level of consciousness: awake and alert and oriented Pain management: pain level controlled Vital Signs Assessment: post-procedure vital signs reviewed and stable Respiratory status: spontaneous breathing and respiratory function stable Cardiovascular status: blood pressure returned to baseline Anesthetic complications: no   No complications documented.   Last Vitals:  Vitals:   06/23/20 0830 06/23/20 0852  BP: (!) 115/92 134/63  Pulse: 90 88  Resp: 20 20  Temp:  36.4 C  SpO2: 90% 97%    Last Pain:  Vitals:   06/23/20 0852  TempSrc: Oral  PainSc: 2                  Myna Bright

## 2020-06-23 NOTE — Anesthesia Procedure Notes (Signed)
Procedure Name: Intubation Date/Time: 06/23/2020 7:32 AM Performed by: Myna Bright, CRNA Pre-anesthesia Checklist: Patient identified, Emergency Drugs available, Suction available and Patient being monitored Patient Re-evaluated:Patient Re-evaluated prior to induction Oxygen Delivery Method: Circle system utilized Preoxygenation: Pre-oxygenation with 100% oxygen Induction Type: IV induction Ventilation: Mask ventilation without difficulty Laryngoscope Size: Mac and 3 Grade View: Grade II Tube type: Oral Tube size: 7.0 mm Number of attempts: 1 Airway Equipment and Method: Stylet Placement Confirmation: ETT inserted through vocal cords under direct vision,  positive ETCO2 and breath sounds checked- equal and bilateral Secured at: 21 cm Tube secured with: Tape Dental Injury: Teeth and Oropharynx as per pre-operative assessment

## 2020-06-23 NOTE — Discharge Instructions (Signed)
Minimally Invasive Cholecystectomy, Care After This sheet gives you information about how to care for yourself after your procedure. Your health care provider may also give you more specific instructions. If you have problems or questions, contact your health care provider. What can I expect after the procedure? After the procedure, it is common to have:  Pain at your incision sites. You will be given medicines to control this pain.  Mild nausea or vomiting.  Bloating and possible shoulder pain from the gas that was used during the procedure. Follow these instructions at home: Medicines  Take over-the-counter and prescription medicines only as told by your health care provider.  If you were prescribed an antibiotic medicine, take or use it as told by your health care provider. Do not stop using the antibiotic even if you start to feel better.  Ask your health care provider if the medicine prescribed to you: ? Requires you to avoid driving or using machinery. ? Can cause constipation. You may need to take these actions to prevent or treat constipation:  Drink enough fluid to keep your urine pale yellow.  Take over-the-counter or prescription medicines.  Eat foods that are high in fiber, such as beans, whole grains, and fresh fruits and vegetables.  Limit foods that are high in fat and processed sugars, such as fried or sweet foods. Incision care  Follow instructions from your health care provider about how to take care of your incisions. Make sure you: ? Wash your hands with soap and water for at least 20 seconds before and after you change your bandage (dressing). If soap and water are not available, use hand sanitizer. ? Change your dressing as told by your health care provider. ? Leave stitches (sutures), skin glue, or adhesive strips in place. These skin closures may need to be in place for 2 weeks or longer. If adhesive strip edges start to loosen and curl up, you may trim the  loose edges. Do not remove adhesive strips completely unless your health care provider tells you to do that.  Do not take baths, swim, or use a hot tub until your health care provider approves. Ask your health care provider if you may take showers. You may only be allowed to take sponge baths.  Check your incision area every day for signs of infection. Check for: ? More redness, swelling, or pain. ? Fluid or blood. ? Warmth. ? Pus or a bad smell.   Activity  Rest as told by your health care provider.  Avoid sitting for a long time without moving. Get up to take short walks every 1-2 hours. This is important to improve blood flow and breathing. Ask for help if you feel weak or unsteady.  Do not lift anything that is heavier than 10 lb (4.5 kg), or the limit that you are told, until your health care provider says that it is safe.  Do not play contact sports until your health care provider approves.  Do not return to work or school until your health care provider approves.  Return to your normal activities as told by your health care provider. Ask your health care provider what activities are safe for you. General instructions  If you were given a sedative during the procedure, it can affect you for several hours. Do not drive or operate machinery until your health care provider says that it is safe.  Keep all follow-up visits as told by your health care provider. This is important. Contact a health  care provider if:  You develop a rash.  You have more redness, swelling, or pain around your incisions.  You have fluid or blood coming from your incisions.  Your incisions feel warm to the touch.  You have pus or a bad smell coming from your incisions.  You have a fever.  One or more of your incisions breaks open. Get help right away if:  You have trouble breathing.  You have chest pain.  You have increasing pain in your shoulders.  You faint or feel dizzy when you  stand.  You have severe pain in your abdomen.  You have nausea or vomiting that lasts for more than one day.  You have leg pain. Summary  After your procedure, it is common to have pain at the incision sites. You may also have nausea or bloating.  Follow your health care provider's instructions about medicine, activity restrictions, and caring for your incision areas. Do not do activities that require a lot of effort.  Contact a health care provider if you have a fever or other signs of infection, such as more redness, swelling, or pain around the incisions.  Get help right away if you have chest pain, increasing pain in the shoulders, or trouble breathing. This information is not intended to replace advice given to you by your health care provider. Make sure you discuss any questions you have with your health care provider. Document Revised: 12/09/2018 Document Reviewed: 12/09/2018 Elsevier Patient Education  [place].    Bupivacaine Liposomal Suspension for Injection What is this medicine? BUPIVACAINE LIPOSOMAL (bue PIV a kane LIP oh som al) is an anesthetic. It causes loss of feeling in the skin or other tissues. It is used to prevent and to treat pain from some procedures. This medicine may be used for other purposes; ask your health care provider or pharmacist if you have questions. COMMON BRAND NAME(S): EXPAREL What should I tell my health care provider before I take this medicine? They need to know if you have any of these conditions:  G6PD deficiency  heart disease  kidney disease  liver disease  low blood pressure  lung or breathing disease, like asthma  an unusual or allergic reaction to bupivacaine, other medicines, foods, dyes, or preservatives  pregnant or trying to get pregnant  breast-feeding How should I use this medicine? This medicine is injected into the affected area. It is given by a health care provider in a hospital or clinic  setting. Talk to your health care provider about the use of this medicine in children. While it may be given to children as young as 6 years for selected conditions, precautions do apply. Overdosage: If you think you have taken too much of this medicine contact a poison control center or emergency room at once. NOTE: This medicine is only for you. Do not share this medicine with others. What if I miss a dose? This does not apply. What may interact with this medicine? This medicine may interact with the following medications:  acetaminophen  certain antibiotics like dapsone, nitrofurantoin, aminosalicylic acid, sulfonamides  certain medicines for seizures like phenobarbital, phenytoin, valproic acid  chloroquine  cyclophosphamide  flutamide  hydroxyurea  ifosfamide  metoclopramide  nitric oxide  nitroglycerin  nitroprusside  nitrous oxide  other local anesthetics like lidocaine, pramoxine, tetracaine  primaquine  quinine  rasburicase  sulfasalazine This list may not describe all possible interactions. Give your health care provider a list of all the medicines, herbs, non-prescription drugs,  or dietary supplements you use. Also tell them if you smoke, drink alcohol, or use illegal drugs. Some items may interact with your medicine. What should I watch for while using this medicine? Your condition will be monitored carefully while you are receiving this medicine. Be careful to avoid injury while the area is numb, and you are not aware of pain. What side effects may I notice from receiving this medicine? Side effects that you should report to your doctor or health care professional as soon as possible:  allergic reactions like skin rash, itching or hives, swelling of the face, lips, or tongue  seizures  signs and symptoms of a dangerous change in heartbeat or heart rhythm like chest pain; dizziness; fast, irregular heartbeat; palpitations; feeling faint or  lightheaded; falls; breathing problems  signs and symptoms of methemoglobinemia such as pale, gray, or blue colored skin; headache; fast heartbeat; shortness of breath; feeling faint or lightheaded, falls; tiredness Side effects that usually do not require medical attention (report to your doctor or health care professional if they continue or are bothersome):  anxious  back pain  changes in taste  changes in vision  constipation  dizziness  fever  nausea, vomiting This list may not describe all possible side effects. Call your doctor for medical advice about side effects. You may report side effects to FDA at 1-800-FDA-1088. Where should I keep my medicine? This drug is given in a hospital or clinic and will not be stored at home. NOTE: This sheet is a summary. It may not cover all possible information. If you have questions about this medicine, talk to your doctor, pharmacist, or health care provider.  2021 Elsevier/Gold Standard (2019-06-17 12:24:57)   General Anesthesia, Adult, Care After This sheet gives you information about how to care for yourself after your procedure. Your health care provider may also give you more specific instructions. If you have problems or questions, contact your health care provider. What can I expect after the procedure? After the procedure, the following side effects are common:  Pain or discomfort at the IV site.  Nausea.  Vomiting.  Sore throat.  Trouble concentrating.  Feeling cold or chills.  Feeling weak or tired.  Sleepiness and fatigue.  Soreness and body aches. These side effects can affect parts of the body that were not involved in surgery. Follow these instructions at home: For the time period you were told by your health care provider:  Rest.  Do not participate in activities where you could fall or become injured.  Do not drive or use machinery.  Do not drink alcohol.  Do not take sleeping pills or medicines  that cause drowsiness.  Do not make important decisions or sign legal documents.  Do not take care of children on your own.   Eating and drinking  Follow any instructions from your health care provider about eating or drinking restrictions.  When you feel hungry, start by eating small amounts of foods that are soft and easy to digest (bland), such as toast. Gradually return to your regular diet.  Drink enough fluid to keep your urine pale yellow.  If you vomit, rehydrate by drinking water, juice, or clear broth. General instructions  If you have sleep apnea, surgery and certain medicines can increase your risk for breathing problems. Follow instructions from your health care provider about wearing your sleep device: ? Anytime you are sleeping, including during daytime naps. ? While taking prescription pain medicines, sleeping medicines, or medicines that make  you drowsy.  Have a responsible adult stay with you for the time you are told. It is important to have someone help care for you until you are awake and alert.  Return to your normal activities as told by your health care provider. Ask your health care provider what activities are safe for you.  Take over-the-counter and prescription medicines only as told by your health care provider.  If you smoke, do not smoke without supervision.  Keep all follow-up visits as told by your health care provider. This is important. Contact a health care provider if:  You have nausea or vomiting that does not get better with medicine.  You cannot eat or drink without vomiting.  You have pain that does not get better with medicine.  You are unable to pass urine.  You develop a skin rash.  You have a fever.  You have redness around your IV site that gets worse. Get help right away if:  You have difficulty breathing.  You have chest pain.  You have blood in your urine or stool, or you vomit blood. Summary  After the procedure,  it is common to have a sore throat or nausea. It is also common to feel tired.  Have a responsible adult stay with you for the time you are told. It is important to have someone help care for you until you are awake and alert.  When you feel hungry, start by eating small amounts of foods that are soft and easy to digest (bland), such as toast. Gradually return to your regular diet.  Drink enough fluid to keep your urine pale yellow.  Return to your normal activities as told by your health care provider. Ask your health care provider what activities are safe for you. This information is not intended to replace advice given to you by your health care provider. Make sure you discuss any questions you have with your health care provider. Document Revised: 11/25/2019 Document Reviewed: 06/24/2019 Elsevier Patient Education  2021 [employer].

## 2020-06-23 NOTE — Interval H&P Note (Signed)
History and Physical Interval Note:  06/23/2020 7:14 AM  Paula [name]  has presented today for surgery, with the diagnosis of Cholelithiasis.  The various methods of treatment have been discussed with the patient and family. After consideration of risks, benefits and other options for treatment, the patient has consented to  Procedure(s): LAPAROSCOPIC CHOLECYSTECTOMY (N/A) as a surgical intervention.  The patient's history has been reviewed, patient examined, no change in status, stable for surgery.  I have reviewed the patient's chart and labs.  Questions were answered to the patient's satisfaction.     Aviva Signs

## 2020-06-23 NOTE — Transfer of Care (Signed)
Immediate Anesthesia Transfer of Care Note  Patient: Paula [name]  Procedure(s) Performed: LAPAROSCOPIC CHOLECYSTECTOMY (N/A Abdomen)  Patient Location: PACU  Anesthesia Type:General  Level of Consciousness: awake, alert , oriented and patient cooperative  Airway & Oxygen Therapy: Patient Spontanous Breathing and Patient connected to nasal cannula oxygen  Post-op Assessment: Report given to RN, Post -op Vital signs reviewed and stable and Patient moving all extremities  Post vital signs: Reviewed and stable  Last Vitals:  Vitals Value Taken Time  BP 150/113 06/23/20 0810  Temp    Pulse 94 06/23/20 0813  Resp 15 06/23/20 0813  SpO2 99 % 06/23/20 0813  Vitals shown include unvalidated device data.  Last Pain:  Vitals:   06/23/20 0635  PainSc: 0-No pain      Patients Stated Pain Goal: 5 (96/04/54 0981)  Complications: No complications documented.

## 2020-06-23 NOTE — Anesthesia Preprocedure Evaluation (Signed)
Anesthesia Evaluation  Patient identified by MRN, date of birth, ID band Patient awake    Reviewed: Allergy & Precautions, NPO status , Patient's Chart, lab work & pertinent test results  History of Anesthesia Complications Negative for: history of anesthetic complications  Airway Mallampati: II  TM Distance: >3 FB Neck ROM: Full    Dental  (+) Teeth Intact, Dental Advisory Given   Pulmonary shortness of breath (sometimes) and with exertion, sleep apnea, Continuous Positive Airway Pressure Ventilation and Oxygen sleep apnea , COPD,  COPD inhaler, former smoker,    Pulmonary exam normal breath sounds clear to auscultation       Cardiovascular Exercise Tolerance: Good hypertension, Pt. on medications Normal cardiovascular exam+ Valvular Problems/Murmurs  Rhythm:Regular Rate:Normal     Neuro/Psych Anxiety negative neurological ROS     GI/Hepatic GERD  Medicated and Controlled,(+)     substance abuse  alcohol use,   Endo/Other  Morbid obesity  Renal/GU      Musculoskeletal  (+) Arthritis , Osteoarthritis,    Abdominal   Peds  Hematology   Anesthesia Other Findings   Reproductive/Obstetrics negative OB ROS                            Anesthesia Physical Anesthesia Plan  ASA: III  Anesthesia Plan: General   Post-op Pain Management:    Induction: Intravenous  PONV Risk Score and Plan: 4 or greater and Ondansetron, Dexamethasone, Midazolam and Scopolamine patch - Pre-op  Airway Management Planned: Oral ETT  Additional Equipment:   Intra-op Plan:   Post-operative Plan: Extubation in OR  Informed Consent: I have reviewed the patients History and Physical, chart, labs and discussed the procedure including the risks, benefits and alternatives for the proposed anesthesia with the patient or authorized representative who has indicated his/her understanding and acceptance.     Dental  advisory given  Plan Discussed with: CRNA and Surgeon  Anesthesia Plan Comments:         Anesthesia Quick Evaluation

## 2020-06-26 LAB — SURGICAL PATHOLOGY

## 2020-06-27 ENCOUNTER — Encounter (HOSPITAL_COMMUNITY): Payer: Self-pay | Admitting: General Surgery

## 2020-06-27 ENCOUNTER — Telehealth: Payer: Self-pay | Admitting: Internal Medicine

## 2020-06-27 NOTE — Telephone Encounter (Signed)
Phoned the pt back and Dr. Arnoldo Morale office had just called her back. Pt already had Rx for "something for constipation" . They advised her to take this medication and I advised her to drink plenty of liquids behind it to flush and to make her stools more softer. The pt agreed.

## 2020-06-27 NOTE — Telephone Encounter (Signed)
Pt said she had gallbladder surgery last week and since then she has become constipated and wanted to know what Dr Paula [name] would recommend to help her go. Please advise. [phone]

## 2020-06-27 NOTE — Telephone Encounter (Signed)
Pt called back again saying that she was hurting bad from her constipation. I told her we had a nurse out and the other nurse was with patients and there's a message that was sent earlier for her to address. I asked her if she's called her surgeon that did her procedure to see if they could recommend something, but she said, "they haven't called me back either".

## 2020-06-28 ENCOUNTER — Telehealth (INDEPENDENT_AMBULATORY_CARE_PROVIDER_SITE_OTHER): Payer: Medicare Other | Admitting: General Surgery

## 2020-06-28 DIAGNOSIS — Z09 Encounter for follow-up examination after completed treatment for conditions other than malignant neoplasm: Secondary | ICD-10-CM

## 2020-06-28 NOTE — Telephone Encounter (Signed)
Telephone visit performed.  Patient is pleased with the results.  She is constipated and is taking MiraLAX.  She does have to strain to move her bowels.  She is drinking plenty fluids.  I told her to add milk of magnesia 2 tablespoons every 6 hours as needed.  Should she still be constipated in a few days, I told her to contact my office.  This was a postoperative telephone visit and part of the global surgical fee, thus it is not a billable service.

## 2020-07-10 ENCOUNTER — Telehealth: Payer: Self-pay | Admitting: Internal Medicine

## 2020-07-10 NOTE — Telephone Encounter (Signed)
[phone] please call patient, she had her gallbladder removed and she is having abdominal pain on the opposite side.  Please advise.

## 2020-07-11 ENCOUNTER — Telehealth: Payer: Self-pay | Admitting: Internal Medicine

## 2020-07-11 NOTE — Telephone Encounter (Addendum)
Had gallbladder removed 2 weeks ago by Dr. Arnoldo Morale. Sunday morning, pt reached down to pick up something and experienced terrible pain in stomach around rib cage.  Upper left quadrant pain.  Sore in this area.  Feels like it could be a knot in this area.  Loose bowels but no diarrhea.  Pt says loose bowels are normal for her.  Pt doesn't think it is gallbladder related since it is on opposite side.

## 2020-07-11 NOTE — Telephone Encounter (Signed)
[phone] please call patient, she called yesterday about a pain she was having and has not been called back yet

## 2020-07-12 NOTE — Telephone Encounter (Signed)
Sounds musculoskeletal.  Does not sound GI related.  Recommend she take Tylenol or ibuprofen to help with pain.  If she continues have issues I recommend that she discuss further with her PCP.  Thank you

## 2020-07-12 NOTE — Telephone Encounter (Signed)
Called pt and made her aware that Dr. Abbey Chatters thinks her symptoms are musculoskeletal and not GI related.  She was advised to take tylenol or ibuprofen to manage pain.  She was made aware to contact her PCP to discuss further.  She voiced understanding.

## 2020-07-24 ENCOUNTER — Other Ambulatory Visit: Payer: Self-pay | Admitting: Internal Medicine

## 2021-03-28 IMAGING — MR MR ABDOMEN WO/W CM MRCP
16 of 20 series · 38 of 48 positions shown · IV contrast (20 ml multihance)
Comparison: Recent clinic note.  No prior imaging available.

CLINICAL DATA: History of pancreatitis. Rule out structural defects
or tumor.

EXAM:
MRI ABDOMEN WITHOUT AND WITH CONTRAST (INCLUDING MRCP)
TECHNIQUE: Multiplanar multisequence MR imaging of the abdomen was performed
both before and after the administration of intravenous contrast.
Heavily T2-weighted images of the biliary and pancreatic ducts were
obtained, and three-dimensional MRCP images were rendered by post
processing.
CONTRAST:  20mL MULTIHANCE GADOBENATE DIMEGLUMINE 529 MG/ML IV SOLN

[Series 3: T2 · coronal · 5.0mm · 1.72mm/px · 1 of 38 slices shown (1 of 4)]
[im 1/38]
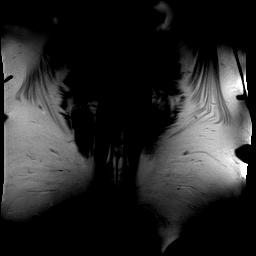

[Series 4: T1 · axial · 3.0mm · 1.31mm/px · z∈[-128,+85]mm · 4 of 144 slices shown]
[im 1/144]
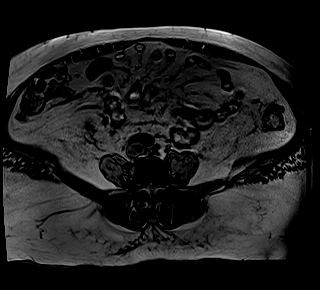
[im 48/144]
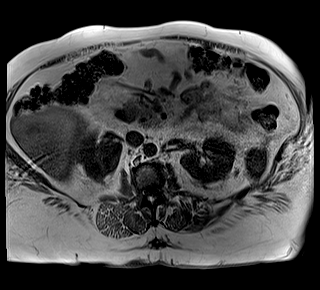
[im 96/144]
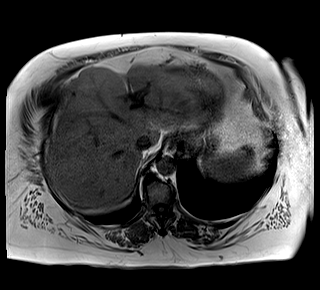
[im 144/144]
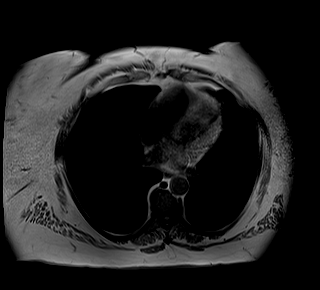

[Series 5: T2 · axial · 6.0mm · 1.31mm/px · 1 of 32 slices shown (2 of 4)]
[im 1/32]
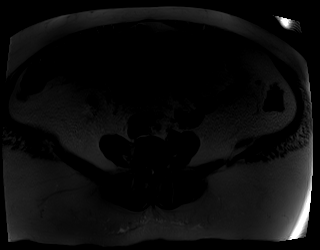

[Series 6: T2 · axial · 5.0mm · 1.64mm/px · 1 of 42 slices shown (3 of 4)]
[im 1/42]
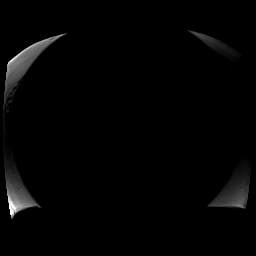

[Series 7: DWI · axial · 5.0mm · 1.57mm/px · z∈[-131,+115]mm · 4 of 126 slices shown (1 of 2)]
[im 1/126]
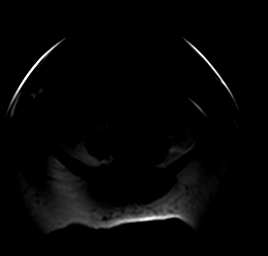
[im 42/126]
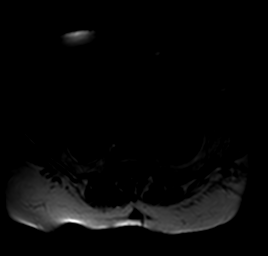
[im 84/126]
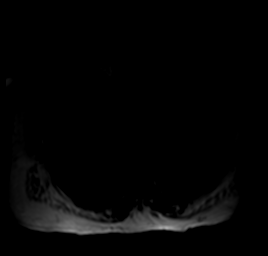
[im 126/126]
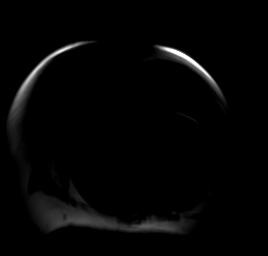

[Series 8: DWI · axial · 5.0mm · 1.57mm/px · 1 of 42 slices shown (2 of 2)]
[im 1/42]
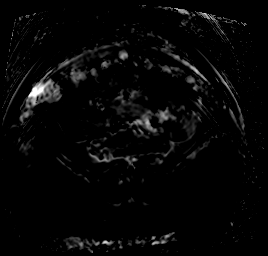

[Series 9: MRCP · coronal · 1.0mm · 0.55mm/px · 3 of 72 slices shown]
[im 1/72]
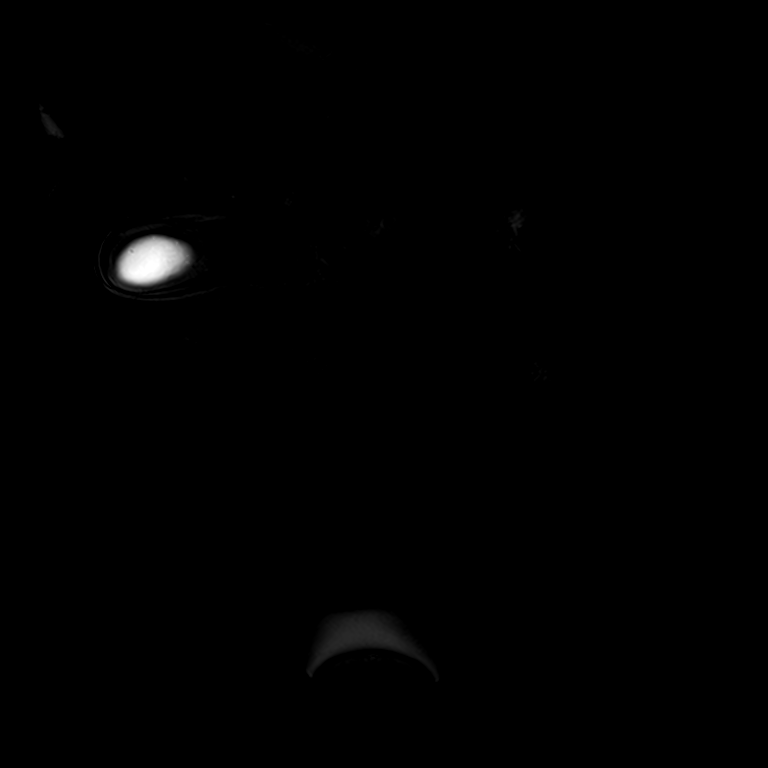
[im 36/72]
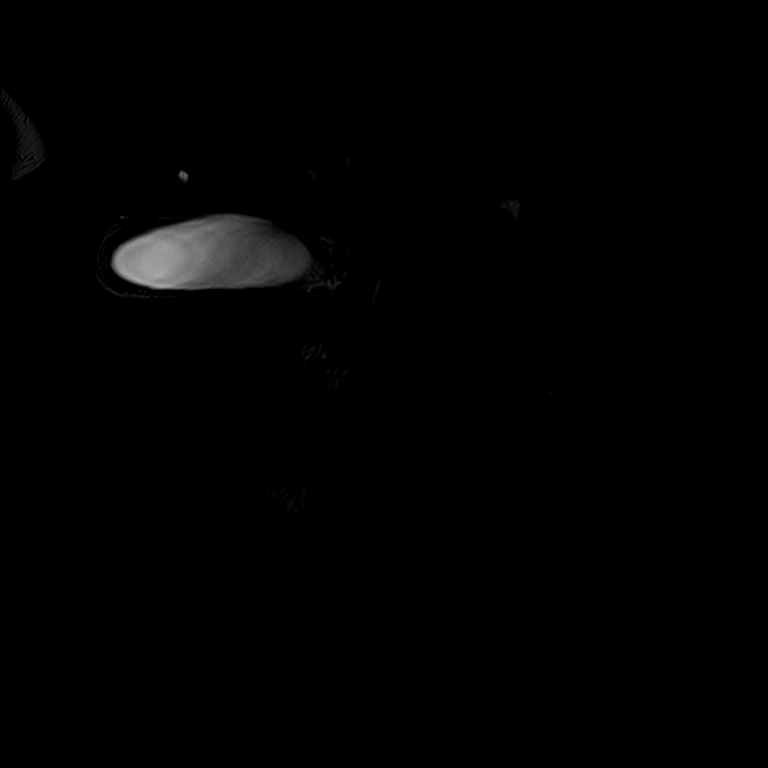
[im 72/72]
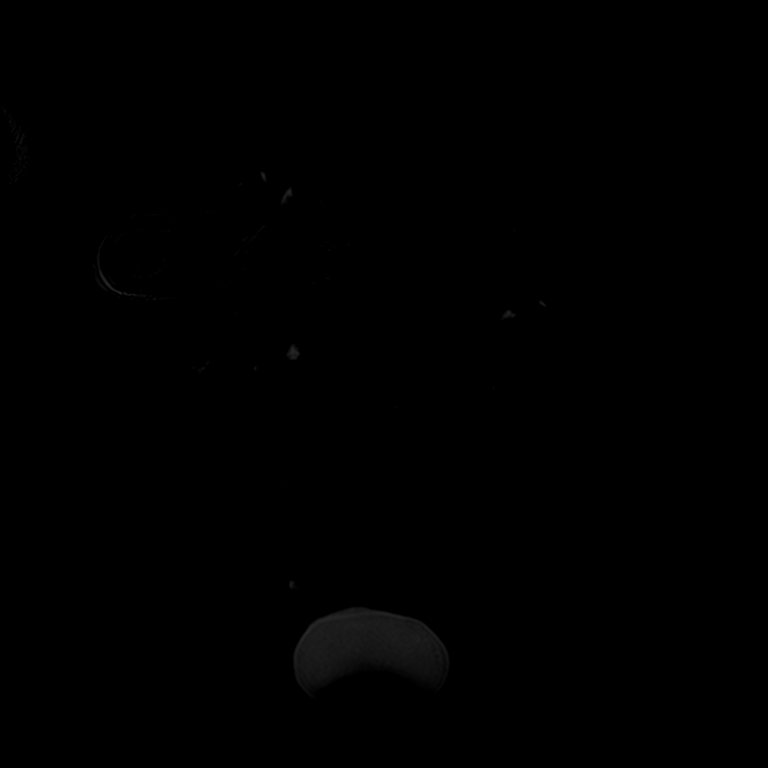

[Series 11: bSSFP · axial · 5.0mm · 1.31mm/px · 1 of 38 slices shown]
[im 1/38]
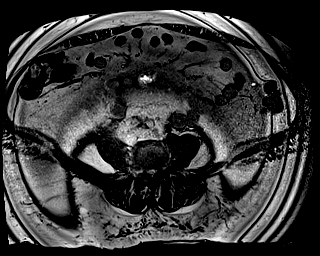

[Series 13: T2 · coronal · 3.0mm · 1.31mm/px · 1 of 24 slices shown (4 of 4)]
[im 1/24]
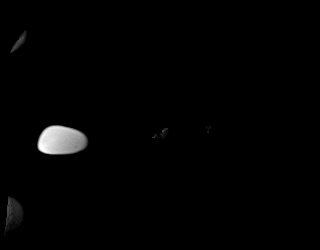

[Series 14: T1 dynamic · axial · non-contrast · 3.0mm · 1.31mm/px · z∈[-128,+85]mm · 3 of 72 slices shown]
[im 1/72]
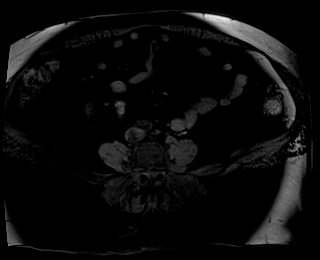
[im 36/72]
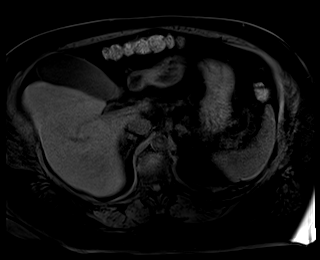
[im 72/72]
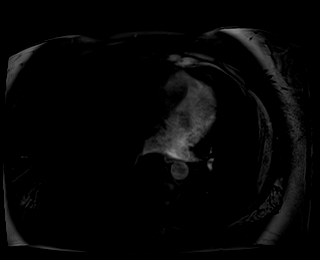

[Series 15: T1 dynamic post-contrast · axial · 3.0mm · 1.31mm/px · z∈[-128,+85]mm · 3 of 72 slices shown (1 of 6)]
[im 1/72]
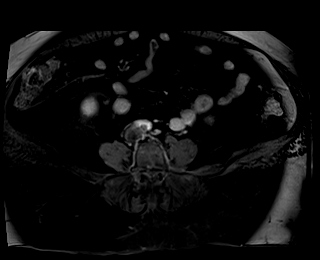
[im 36/72]
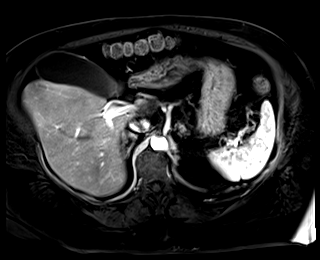
[im 72/72]
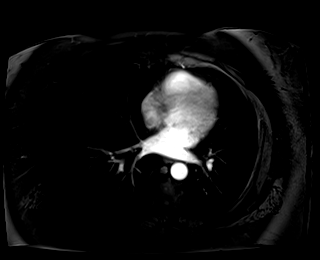

[Series 16: T1 dynamic post-contrast · axial · 3.0mm · 1.31mm/px · z∈[-128,+85]mm · 3 of 72 slices shown (2 of 6)]
[im 1/72]
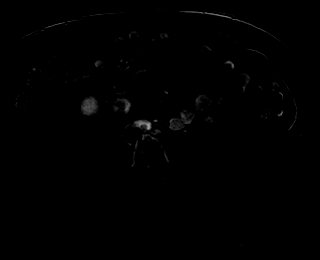
[im 36/72]
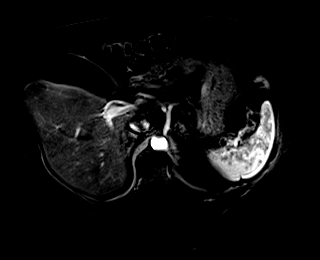
[im 72/72]
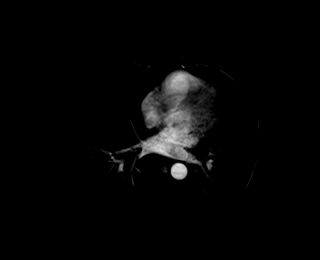

[Series 17: T1 dynamic post-contrast · axial · 3.0mm · 1.31mm/px · z∈[-128,+85]mm · 3 of 72 slices shown (3 of 6)]
[im 1/72]
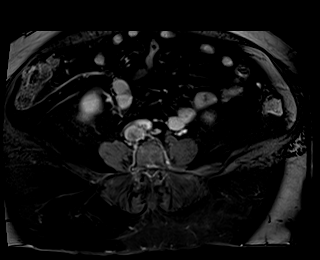
[im 36/72]
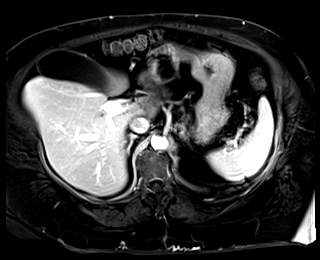
[im 72/72]
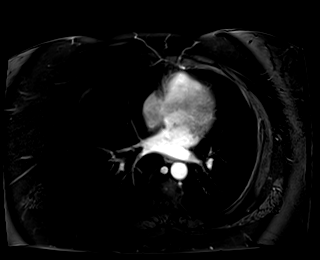

[Series 18: T1 dynamic post-contrast · axial · 3.0mm · 1.31mm/px · z∈[-128,+85]mm · 3 of 72 slices shown (4 of 6)]
[im 1/72]
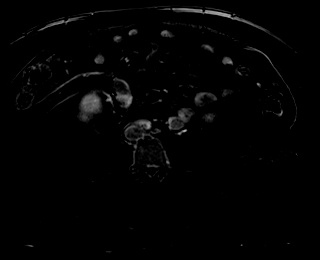
[im 36/72]
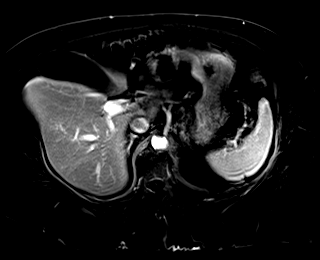
[im 72/72]
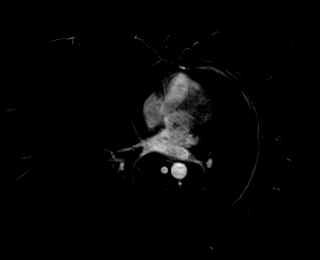

[Series 19: T1 dynamic post-contrast · axial · 3.0mm · 1.31mm/px · z∈[-128,+85]mm · 3 of 72 slices shown (5 of 6)]
[im 1/72]
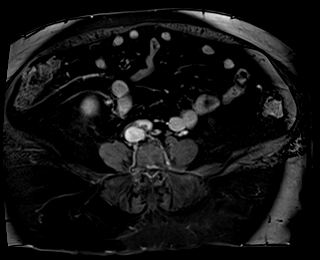
[im 36/72]
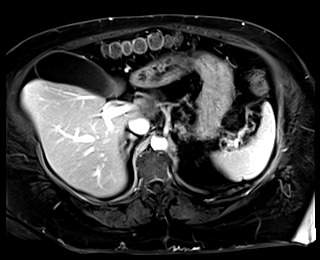
[im 72/72]
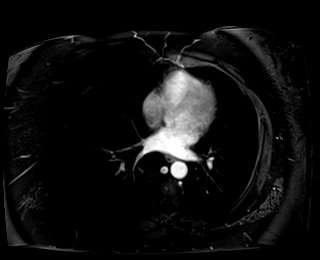

[Series 20: T1 dynamic post-contrast · axial · 3.0mm · 1.31mm/px · z∈[-128,+85]mm · 3 of 72 slices shown (6 of 6)]
[im 1/72]
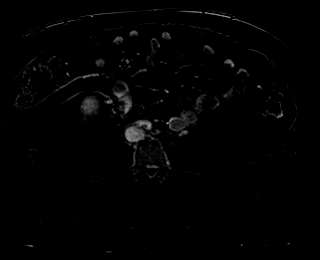
[im 36/72]
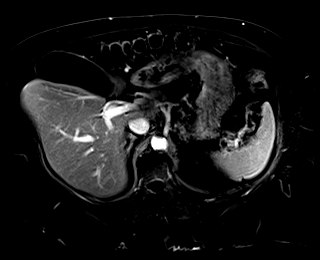
[im 72/72]
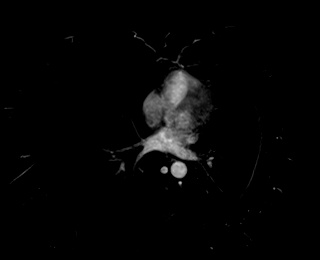

[38 of 48 positions shown; findings below may reference images not displayed]

FINDINGS: Lower chest: Normal heart size without pericardial or pleural
effusion.

Hepatobiliary: High right hepatic lobe subcentimeter cyst.

Tiny gallstones including on [DATE]. No cholecystitis. No biliary duct
dilatation or choledocholithiasis.

Pancreas: No evidence of acute pancreatitis. No pancreatic duct
dilatation. No pancreatic mass or evidence of pancreas divisum.

Spleen:  Normal in size, without focal abnormality.

Adrenals/Urinary Tract: Normal adrenal glands. Tiny left renal
lesions are likely cysts. Normal right kidney, without
hydronephrosis.

Stomach/Bowel: Proximal gastric underdistention, with apparent
greater curvature gastric wall thickening of 2.5 cm on [DATE]. Normal
abdominal bowel loops.

Vascular/Lymphatic: Aortic atherosclerosis. No retroperitoneal or
retrocrural adenopathy.

Other:  No ascites.

Musculoskeletal: Soft tissue fullness within the lower left rib on
[DATE] is likely due to remote trauma and callus deposition.
IMPRESSION: 1. No evidence of pancreatitis, pancreas divisum, or pancreatic
mass.
2.  No acute abdominal process.
3. Apparent greater curvature gastric wall thickening is favored to
be due to underdistention. Correlate with symptoms of gastritis.
4.  Aortic Atherosclerosis (BQUIG-UBC.C).
5. Cholelithiasis.

## 2021-05-02 ENCOUNTER — Other Ambulatory Visit: Payer: Self-pay | Admitting: Internal Medicine

## 2021-05-29 ENCOUNTER — Other Ambulatory Visit: Payer: Self-pay | Admitting: Orthopaedic Surgery

## 2021-11-01 ENCOUNTER — Other Ambulatory Visit: Payer: Self-pay

## 2021-11-01 DIAGNOSIS — R1310 Dysphagia, unspecified: Secondary | ICD-10-CM | POA: Insufficient documentation

## 2021-11-01 DIAGNOSIS — Z86010 Personal history of colonic polyps: Secondary | ICD-10-CM

## 2021-11-01 DIAGNOSIS — Z1211 Encounter for screening for malignant neoplasm of colon: Secondary | ICD-10-CM | POA: Insufficient documentation

## 2021-11-13 ENCOUNTER — Other Ambulatory Visit: Payer: Self-pay | Admitting: Internal Medicine

## 2021-12-03 ENCOUNTER — Ambulatory Visit: Payer: Medicare Other

## 2021-12-03 NOTE — PSS Phone Screening (Signed)
Pre-Anesthesia Evaluation    Pre-op phone visit requested by: Francisca December, MD  Reason for pre-op phone visit: Patient anticipating COLONOSCOPY, EGD, BIOPSY procedure.    Language Assistant  Interpreter: N/A - English is preferred language    No orders of the defined types were placed in this encounter.      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   12/15/2018    Atrophic vaginitis N95.2   12/15/2018    Elevated aspartate aminotransferase level R74.01   12/15/2018    Gastroesophageal reflux disease with esophagitis K21.00   12/15/2018    Insomnia G47.00   12/15/2018    Overview Signed 12/15/2018  1:33 PM by Rutherford Guys, MD     Pt does not wish to try sleep aids at this time  - discussed sleep hygeine issues to help improve sleep pattern  - When unable to quickly fall back asleep, Pt to try getting out of bed and performing short activity (reading, watch tv, etc) prior to attempting to fall asleep again; will not spend hours of time just lying awake in bed.  - use bed for sleep only, not for reading, t.v., eating etc  -f/u if no improvement  Increase Nortriptyline to 100 mg qhs.         Migraine headache G43.909   12/15/2018    Overview Signed 12/15/2018  1:33 PM by Rutherford Guys, MD     Given trial of Imitrex.         Osteoarthritis M19.90   12/15/2018    Overview Signed 12/15/2018  1:33 PM by Rutherford Guys, MD     OA pain of L thumb;     -trial of high dose acetaminophen  - cannot provide ultram for breakthrough pain due to reaction between tramadol and  TCAs (which Pt takes for migraines)   - splint thumb while  sleeping and during times of increased pain during the day  - trial of ice tx during pain exacerbations  - f/u if no improvement         Osteopenia M85.80   12/15/2018    Dysphagia, unspecified type R13.10   11/01/2021    Personal history of colonic polyps Z86.010   11/01/2021        Medical History   Diagnosis Date    Abnormal vision     glasses    Arthritis 10/2014    right knee    Cochlear implant in place     Constipation 06/2016    Depression 1990    Fecal incontinence     Gastric ulceration 07/2016    Gastroesophageal reflux disease     Controlled with diet    Headache 1990    Left side migraines- controlled by pamalor    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, right ear; has Left ear hearing aid    History of acoustic neuroma  Hyperlipidemia     Controlled diet and exercise    Pericardial effusion 2017    "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     Past Surgical History:   Procedure Laterality Date    ADENOIDECTOMY  1960    ARTHROSCOPY, KNEE Right 11/29/2014    Procedure: ARTHROSCOPY, KNEE, RIGHT KNEE PARTIAL MENISCECTOMY/SUBCHONDROPLASTY (ORIF TIBIAL PLATEU) ;  Surgeon: Joni Reining, MD;  Location: [facility];  Service: Orthopedics;  Laterality: Right;  RIGHT KNEE PARTIAL MENISCECTOMY/SUBCHONDROPLASTY (ORIF TIBIAL PLATEU)      BRAIN SURGERY  2015    Acoustic neuroma and 4 CFL leak procedures    CESAREAN SECTION  1985    CRANIOTOMY, TRANS-PETROSAL APPROACH  04/14/2013    Procedure: Marcy Panning APPROACH;  Surgeon: Ericka Pontiff, MD;  Location: [facility];  Service: Neurosurgery;  Laterality: Right;  RIGHT TRANSLABYRINTHINE APPROACH FOR VESTIBULAR SCHWANNOMA, ABDOMINAL FAT GRAFT    EGD N/A 10/11/2016    Procedure: EGD;  Surgeon: Juanetta Snow, MD;  Location: [facility];  Service: Gastroenterology;  Laterality:  N/A;  EGD  Q1=N/A    EGD, 48 HR PH MONITOR, INSERTION N/A 12/01/2018    Procedure: EGD, 48 HR PH MONITOR, INSERTION;  Surgeon: Alisia Ferrari, MD;  Location: [facility];  Service: Gastroenterology;  Laterality: N/A;  egd w/ bravo  q1-unk, md req 30 min    EGD, COLONOSCOPY N/A 08/02/2016    Procedure: EGD, COLONOSCOPY;  Surgeon: Juanetta Snow, MD;  Location: [facility];  Service: Gastroenterology;  Laterality: N/A;  EGD/COLONOSCOPY  Q1=N    HYSTERECTOMY  1995    INSERTION, OSSEOINTEGRATED IMP/TEMPORAL BONE/BAHA  07/05/2013    Procedure: INSERTION, OSSEOINTEGRATED IMP/TEMPORAL BONE/BAHA;  Surgeon: Judie Bonus, MD PHD;  Location: [facility];  Service: ENT;  Laterality: N/A;  RIGHT BAHA    INSERTION, OSSEOINTEGRATED IMP/TEMPORAL BONE/BAHA Right 11/23/2019    Procedure: REMOVAL OF OSSEOINTEGRATED IMP/TEMPORAL BONE/BAHA;  Surgeon: Thurman Coyer, MD;  Location: [facility];  Service: ENT;  Laterality: Right;  REMOVAL OF RIGHT BAHA ATTRACT, DEBRIDEMENT OF RIGHT MASTOID CAVITY    KNEE ARTHROSCOPY W/ MENISCAL REPAIR Left 1999    MASTOIDECTOMY, MODIFIED  04/14/2013    Procedure: MASTOIDECTOMY, MODIFIED;  Surgeon: Judie Bonus, MD PHD;  Location: [facility];  Service: ENT;  Laterality: N/A;    MASTOIDECTOMY, MODIFIED  07/05/2013    Procedure: MASTOIDECTOMY, MODIFIED;  Surgeon: Judie Bonus, MD PHD;  Location: [facility];  Service: ENT;  Laterality: N/A;  RIGHT MIDDLE EAR OBLITERATION W/ OVERSEW OF EAR CANAL,    MYRINGOPLASTY, FAT GRAFT  07/05/2013    Procedure: MYRINGOPLASTY, FAT GRAFT;  Surgeon: Judie Bonus, MD PHD;  Location: [facility];  Service: ENT;  Laterality: Right;   ABDOMINAL FAT GRAFT,     TONSILLECTOMY  1960    TYMPANOPLASTY, EXPLORATION , REMOVAL CHOLESTEATOMA  04/19/2013    Procedure: TYMPANOPLASTY, EXPLORATION , REMOVAL CHOLESTEATOMA;  Surgeon: Judie Bonus, MD PHD;  Location: [facility];  Service: ENT;  Laterality: Right;  Middle Ear Obliteration with fat graft  harvest from abdomen; Control of CSF leak        Medication List            Accurate as of December 03, 2021  1:54 PM. Always use your most recent med list.  calcium carbonate-vitamin D 600-400 MG-UNIT per tablet  Take 1 tablet by mouth every evening  Medication Adjustments for Surgery: Hold day of surgery     Calcium-Magnesium-Zinc 500-250-12.5 MG Tabs  Take 1 tablet by mouth every morning     cetirizine 5 MG tablet  Take 1 tablet (5 mg) by mouth every evening  Commonly known as: ZyrTEC  Medication Adjustments for Surgery: Take as prescribed     multivitamin capsule  Take 1 capsule by mouth every morning  Medication Adjustments for Surgery: Hold day of surgery     nortriptyline 25 MG capsule  Take 2 capsules (50 mg) by mouth nightly  Commonly known as: PAMELOR  Medication Adjustments for Surgery: Take as prescribed     polyethylene glycol 17 g packet  Take 17 g by mouth every other day  Commonly known as: MIRALAX  Medication Adjustments for Surgery: Take as prescribed     zolpidem 6.25 MG CR tablet  Take 1 tablet (6.25 mg) by mouth nightly as needed for Sleep  Commonly known as: AMBIEN CR  Medication Adjustments for Surgery: Take as needed            No Known Allergies  History reviewed. No pertinent family history.  Social History     Occupational History    Not on file   Tobacco Use    Smoking status: Former     Packs/day: 0.50     Years: 2.00     Additional pack years: 0.00     Total pack years: 1.00     Types: Cigarettes     Quit date: 03/26/1971     Years since quitting: 50.7    Smokeless tobacco: Never    Tobacco comments:     Smoked in college   Vaping Use    Vaping Use: Never used   Substance and Sexual Activity    Alcohol use: Yes     Alcohol/week: 5.0 standard drinks of alcohol     Types: 5 Glasses of wine per week     Comment: Some weeks less or none    Drug use: No    Sexual activity: Never       Menstrual History:   LMP / Status  Hysterectomy     No LMP recorded. Patient has had a  hysterectomy.    Tubal Ligation?  No valid surgical or medical questions entered.             Exam Scores:   SDB score Risk Category: Confirmed No OSA    PONV score Nausea Risk: MODERATE RISK    MST score MST Score: 0    Allergy score Risk Category: Low Risk    Frailty score CFS Score: 3       Visit Vitals  Ht 1.702 m (5\' 7" )   Wt 58.5 kg (129 lb)   BMI 20.20 kg/m

## 2021-12-06 ENCOUNTER — Encounter (HOSPITAL_COMMUNITY)
Admission: RE | Admit: 2021-12-06 | Discharge: 2021-12-06 | Disposition: A | Discharge status: Home / Self Care | Payer: Medicare Other | Source: Ambulatory Visit | Attending: Internal Medicine | Admitting: Internal Medicine

## 2021-12-06 ENCOUNTER — Encounter (HOSPITAL_COMMUNITY): Payer: Self-pay

## 2021-12-06 VITALS — BP 134/66 | HR 73 | Ht 60.0 in | Wt 264.1 lb

## 2021-12-06 DIAGNOSIS — J449 Chronic obstructive pulmonary disease, unspecified: Secondary | ICD-10-CM | POA: Insufficient documentation

## 2021-12-06 NOTE — Progress Notes (Signed)
Pulmonary Individual Treatment Plan  Patient Details  Name: Paula [name] MRN: [id number] Date of Birth: October 28, 1950 Referring Provider:   [place] from 12/06/2021 in [place]  Referring Provider Dr. Georgann Housekeeper       Initial Encounter Date:  Flowsheet Row PULMONARY REHAB OTHER RESP ORIENTATION from 12/06/2021 in [facility]  Date 12/06/21       Visit Diagnosis: Chronic obstructive pulmonary disease, unspecified COPD type ([place])  Patient's Home Medications on Admission:   Current Outpatient Medications:    acetaminophen (TYLENOL) 500 MG tablet, Take 500-1,000 mg by mouth every 6 (six) hours as needed (pain.)., Disp: , Rfl:    albuterol (VENTOLIN HFA) 108 (90 Base) MCG/ACT inhaler, Inhale 2 puffs into the lungs every 6 (six) hours as needed for wheezing or shortness of breath., Disp: , Rfl:    azelastine (ASTELIN) 0.1 % nasal spray, Place 2 sprays into both nostrils 2 (two) times daily as needed for allergies. Use in each nostril as directed, Disp: , Rfl:    budesonide-formoterol (SYMBICORT) 160-4.5 MCG/ACT inhaler, Inhale 2 puffs into the lungs 2 (two) times daily., Disp: , Rfl:    Cholecalciferol (VITAMIN D) 125 MCG (5000 UT) CAPS, Take 5,000 Units by mouth in the morning., Disp: , Rfl:    clotrimazole-betamethasone (LOTRISONE) cream, Apply 1 application topically 2 (two) times daily as needed (skin irritation.). , Disp: , Rfl:    diclofenac (VOLTAREN) 75 MG EC tablet, Take 75 mg by mouth daily as needed (arthritis pain)., Disp: , Rfl:    ezetimibe (ZETIA) 10 MG tablet, Take 10 mg by mouth in the morning., Disp: , Rfl:    famotidine (PEPCID) 40 MG tablet, Take 40 mg by mouth at bedtime., Disp: , Rfl:    fluticasone (FLONASE) 50 MCG/ACT nasal spray, Place 2 sprays into both nostrils daily as needed for allergies., Disp: , Rfl:    guaiFENesin (MUCINEX) 600 MG 12 hr tablet, Take 600 mg by mouth daily as  needed (congestion/COPD issues)., Disp: , Rfl:    hydrochlorothiazide (HYDRODIURIL) 25 MG tablet, Take 25 mg by mouth in the morning., Disp: , Rfl:    Ketotifen Fumarate (ALLERGY EYE DROPS OP), Place 1 drop into both eyes 2 (two) times daily as needed (allergy eyes)., Disp: , Rfl:    loratadine (CLARITIN) 10 MG tablet, Take 10 mg by mouth daily as needed for allergies., Disp: , Rfl:    losartan (COZAAR) 100 MG tablet, Take 100 mg by mouth in the morning., Disp: , Rfl:    montelukast (SINGULAIR) 10 MG tablet, Take 10 mg by mouth at bedtime., Disp: , Rfl:    Multiple Vitamins-Minerals (PRESERVISION AREDS 2 PO), Take 1 tablet by mouth in the morning and at bedtime., Disp: , Rfl:    omeprazole (PRILOSEC) 40 MG capsule, Take 40 mg by mouth daily before breakfast., Disp: , Rfl:    sertraline (ZOLOFT) 100 MG tablet, Take 100 mg by mouth at bedtime., Disp: , Rfl:    terbinafine (LAMISIL) 250 MG tablet, Take 250 mg by mouth in the morning., Disp: , Rfl:    NON FORMULARY, CPAP, O2 at night, Disp: , Rfl:   Past Medical History: Past Medical History:  Diagnosis Date   Anxiety    Arthritis    COPD (chronic obstructive pulmonary disease) (HCC)    Heart murmur    Hypertension    Lung nodule    LLL   Sleep apnea    CPAP  with O2 2 liters    Tubal ectopic pregnancy 1970   Vitelliform macular dystrophy     Tobacco Use: Social History   Tobacco Use  Smoking Status Former   Packs/day: 0.75   Years: 20.00   Total pack years: 15.00   Types: Cigarettes  Smokeless Tobacco Former   Quit date: 10/31/1998    Labs: Review Flowsheet        No data to display          Capillary Blood Glucose: No results found for: "GLUCAP"   Pulmonary Assessment Scores:  Pulmonary Assessment Scores     Row Name 12/06/21 1250         ADL UCSD   ADL Phase Entry     SOB Score total 33     Rest 0     Walk 3     Stairs 4     Bath 0     Dress 1     Shop 2       CAT Score   CAT Score 11       mMRC  Score   mMRC Score 2             UCSD: Self-administered rating of dyspnea associated with activities of daily living (ADLs) 6-point scale (0 = "not at all" to 5 = "maximal or unable to do because of breathlessness")  Scoring Scores range from 0 to 120.  Minimally important difference is 5 units  CAT: CAT can identify the health impairment of COPD patients and is better correlated with disease progression.  CAT has a scoring range of zero to 40. The CAT score is classified into four groups of low (less than 10), medium (10 - 20), high (21-30) and very high (31-40) based on the impact level of disease on health status. A CAT score over 10 suggests significant symptoms.  A worsening CAT score could be explained by an exacerbation, poor medication adherence, poor inhaler technique, or progression of COPD or comorbid conditions.  CAT MCID is 2 points  mMRC: mMRC (Modified Medical Research Council) Dyspnea Scale is used to assess the degree of baseline functional disability in patients of respiratory disease due to dyspnea. No minimal important difference is established. A decrease in score of 1 point or greater is considered a positive change.   Pulmonary Function Assessment:   Exercise Target Goals: Exercise Program Goal: Individual exercise prescription set using results from initial 6 min walk test and THRR while considering  patient's activity barriers and safety.   Exercise Prescription Goal: Initial exercise prescription builds to 30-45 minutes a day of aerobic activity, 2-3 days per week.  Home exercise guidelines will be given to patient during program as part of exercise prescription that the participant will acknowledge.  Activity Barriers & Risk Stratification:  Activity Barriers & Cardiac Risk Stratification - 12/06/21 1313       Activity Barriers & Cardiac Risk Stratification   Activity Barriers Arthritis;Shortness of Breath    Cardiac Risk Stratification Low              6 Minute Walk:  6 Minute Walk     Row Name 12/06/21 1357         6 Minute Walk   Phase Initial     Distance 800 feet     Walk Time 6 minutes     # of Rest Breaks 2     MPH 1.51     METS 1.11  RPE 14     Perceived Dyspnea  14     VO2 Peak 3.89     Symptoms Yes (comment)     Comments two standing breaks due to SOB and bilateral hip pain (6/10) 20 & 30 seconds.     Resting HR 73 bpm     Resting BP 134/66     Resting Oxygen Saturation  94 %     Exercise Oxygen Saturation  during 6 min walk 87 %     Max Ex. HR 120 bpm     Max Ex. BP 160/66     2 Minute Post BP 140/66       Interval HR   1 Minute HR 100     2 Minute HR 103     3 Minute HR 103     4 Minute HR 113     5 Minute HR 120     6 Minute HR 115     2 Minute Post HR 76     Interval Heart Rate? Yes       Interval Oxygen   Interval Oxygen? Yes     Baseline Oxygen Saturation % 94 %     1 Minute Oxygen Saturation % 87 %     1 Minute Liters of Oxygen 0 L     2 Minute Oxygen Saturation % 88 %     2 Minute Liters of Oxygen 0 L     3 Minute Oxygen Saturation % 90 %     3 Minute Liters of Oxygen 0 L     4 Minute Oxygen Saturation % 87 %     4 Minute Liters of Oxygen 0 L     5 Minute Oxygen Saturation % 87 %     5 Minute Liters of Oxygen 0 L     6 Minute Oxygen Saturation % 87 %     6 Minute Liters of Oxygen 0 L     2 Minute Post Oxygen Saturation % 95 %     2 Minute Post Liters of Oxygen 0 L              Oxygen Initial Assessment:  Oxygen Initial Assessment - 12/06/21 1249       Home Oxygen   Home Oxygen Device Home Concentrator    Sleep Oxygen Prescription Continuous;CPAP    Liters per minute 2    Home Exercise Oxygen Prescription None    Home Resting Oxygen Prescription None    Compliance with Home Oxygen Use Yes      Initial 6 min Walk   Oxygen Used None      Program Oxygen Prescription   Program Oxygen Prescription None      Intervention   Short Term Goals To learn and exhibit  compliance with exercise, home and travel O2 prescription;To learn and understand importance of monitoring SPO2 with pulse oximeter and demonstrate accurate use of the pulse oximeter.;To learn and understand importance of maintaining oxygen saturations>88%;To learn and demonstrate proper pursed lip breathing techniques or other breathing techniques.     Long  Term Goals Exhibits compliance with exercise, home  and travel O2 prescription;Verbalizes importance of monitoring SPO2 with pulse oximeter and return demonstration;Maintenance of O2 saturations>88%;Exhibits proper breathing techniques, such as pursed lip breathing or other method taught during program session             Oxygen Re-Evaluation:   Oxygen Discharge (Final Oxygen Re-Evaluation):   Initial Exercise Prescription:  Initial  Exercise Prescription - 12/06/21 1400       Date of Initial Exercise RX and Referring Provider   Date 12/06/21    Referring Provider Dr. Georgann Housekeeper    Expected Discharge Date 04/11/22      NuStep   Level 1    SPM 60    Minutes 22      Arm Ergometer   Level 1    RPM 45    Minutes 17      Prescription Details   Frequency (times per week) 2    Duration Progress to 30 minutes of continuous aerobic without signs/symptoms of physical distress      Intensity   THRR 40-80% of Max Heartrate 52-118    Ratings of Perceived Exertion 11-13    Perceived Dyspnea 0-4      Resistance Training   Training Prescription Yes    Weight 2    Reps 10-15             Perform Capillary Blood Glucose checks as needed.  Exercise Prescription Changes:   Exercise Comments:   Exercise Goals and Review:   Exercise Goals     Row Name 12/06/21 1402             Exercise Goals   Increase Physical Activity Yes       Intervention Provide advice, education, support and counseling about physical activity/exercise needs.;Develop an individualized exercise prescription for aerobic and resistive training  based on initial evaluation findings, risk stratification, comorbidities and participant's personal goals.       Expected Outcomes Short Term: Attend rehab on a regular basis to increase amount of physical activity.;Long Term: Add in home exercise to make exercise part of routine and to increase amount of physical activity.;Long Term: Exercising regularly at least 3-5 days a week.       Increase Strength and Stamina Yes       Intervention Provide advice, education, support and counseling about physical activity/exercise needs.;Develop an individualized exercise prescription for aerobic and resistive training based on initial evaluation findings, risk stratification, comorbidities and participant's personal goals.       Expected Outcomes Short Term: Increase workloads from initial exercise prescription for resistance, speed, and METs.;Short Term: Perform resistance training exercises routinely during rehab and add in resistance training at home;Long Term: Improve cardiorespiratory fitness, muscular endurance and strength as measured by increased METs and functional capacity (6MWT)       Able to understand and use rate of perceived exertion (RPE) scale Yes       Intervention Provide education and explanation on how to use RPE scale       Expected Outcomes Short Term: Able to use RPE daily in rehab to express subjective intensity level;Long Term:  Able to use RPE to guide intensity level when exercising independently       Able to understand and use Dyspnea scale Yes       Intervention Provide education and explanation on how to use Dyspnea scale       Expected Outcomes Short Term: Able to use Dyspnea scale daily in rehab to express subjective sense of shortness of breath during exertion;Long Term: Able to use Dyspnea scale to guide intensity level when exercising independently       Knowledge and understanding of Target Heart Rate Range (THRR) Yes       Intervention Provide education and explanation of  THRR including how the numbers were predicted and where they are located for reference  Expected Outcomes Short Term: Able to state/look up THRR;Short Term: Able to use daily as guideline for intensity in rehab;Long Term: Able to use THRR to govern intensity when exercising independently       Able to check pulse independently Yes       Intervention Provide education and demonstration on how to check pulse in carotid and radial arteries.;Review the importance of being able to check your own pulse for safety during independent exercise       Expected Outcomes Short Term: Able to explain why pulse checking is important during independent exercise;Long Term: Able to check pulse independently and accurately       Understanding of Exercise Prescription Yes       Intervention Provide education, explanation, and written materials on patient's individual exercise prescription       Expected Outcomes Short Term: Able to explain program exercise prescription;Long Term: Able to explain home exercise prescription to exercise independently                Exercise Goals Re-Evaluation :   Discharge Exercise Prescription (Final Exercise Prescription Changes):   Nutrition:  Target Goals: Understanding of nutrition guidelines, daily intake of sodium '1500mg'$ , cholesterol '200mg'$ , calories 30% from fat and 7% or less from saturated fats, daily to have 5 or more servings of fruits and vegetables.  Biometrics:  Pre Biometrics - 12/06/21 1402       Pre Biometrics   Height 5' (1.524 m)    Weight 119.8 kg    Waist Circumference 54 inches    Hip Circumference 58 inches    Waist to Hip Ratio 0.93 %    BMI (Calculated) 51.58    Triceps Skinfold 45 mm    % Body Fat 63.3 %    Grip Strength 12.3 kg    Flexibility 0 in    Single Leg Stand 0 seconds              Nutrition Therapy Plan and Nutrition Goals:  Nutrition Therapy & Goals - 12/06/21 1246       Personal Nutrition Goals   Comments  Patient scored 52 on her diet assessment. Handout provided and explained regarding healthier choices. We offer 2 educational sessions on heart healthy nutrition with handouts and assistance with RD referral if patient is interested.      Intervention Plan   Intervention Nutrition handout(s) given to patient.    Expected Outcomes Short Term Goal: Understand basic principles of dietary content, such as calories, fat, sodium, cholesterol and nutrients.             Nutrition Assessments:  Nutrition Assessments - 12/06/21 1246       MEDFICTS Scores   Pre Score 52            MEDIFICTS Score Key: ?70 Need to make dietary changes  40-70 Heart Healthy Diet ? 40 Therapeutic Level Cholesterol Diet   Picture Your Plate Scores: <08 Unhealthy dietary pattern with much room for improvement. 41-50 Dietary pattern unlikely to meet recommendations for good health and room for improvement. 51-60 More healthful dietary pattern, with some room for improvement.  >60 Healthy dietary pattern, although there may be some specific behaviors that could be improved.    Nutrition Goals Re-Evaluation:   Nutrition Goals Discharge (Final Nutrition Goals Re-Evaluation):   Psychosocial: Target Goals: Acknowledge presence or absence of significant depression and/or stress, maximize coping skills, provide positive support system. Participant is able to verbalize types and ability to use techniques  and skills needed for reducing stress and depression.  Initial Review & Psychosocial Screening:  Initial Psych Review & Screening - 12/06/21 1348       Initial Review   Current issues with Current Psychotropic Meds;Current Depression;Current Sleep Concerns      Family Dynamics   Good Support System? Yes      Barriers   Psychosocial barriers to participate in program There are no identifiable barriers or psychosocial needs.      Screening Interventions   Interventions Encouraged to exercise;Provide  feedback about the scores to participant    Expected Outcomes Short Term goal: Identification and review with participant of any Quality of Life or Depression concerns found by scoring the questionnaire.             Quality of Life Scores:  Quality of Life - 12/06/21 1403       Quality of Life   Select Quality of Life      Quality of Life Scores   Health/Function Pre 11.63 %    Socioeconomic Pre 17.14 %    Psych/Spiritual Pre 14.93 %    Family Pre 21 %    GLOBAL Pre 14.54 %            Scores of 19 and below usually indicate a poorer quality of life in these areas.  A difference of  2-3 points is a clinically meaningful difference.  A difference of 2-3 points in the total score of the Quality of Life Index has been associated with significant improvement in overall quality of life, self-image, physical symptoms, and general health in studies assessing change in quality of life.   PHQ-9: Review Flowsheet       12/06/2021  Depression screen PHQ 2/9  Decreased Interest 2  Down, Depressed, Hopeless 0  PHQ - 2 Score 2  Altered sleeping 1  Tired, decreased energy 2  Change in appetite 2  Feeling bad or failure about yourself  0  Trouble concentrating 0  Moving slowly or fidgety/restless 0  Suicidal thoughts 0  PHQ-9 Score 7  Difficult doing work/chores Somewhat difficult   Interpretation of Total Score  Total Score Depression Severity:  1-4 = Minimal depression, 5-9 = Mild depression, 10-14 = Moderate depression, 15-19 = Moderately severe depression, 20-27 = Severe depression   Psychosocial Evaluation and Intervention:  Psychosocial Evaluation - 12/06/21 1349       Psychosocial Evaluation & Interventions   Interventions Stress management education;Relaxation education;Encouraged to exercise with the program and follow exercise prescription    Comments Patient has no psychosocial barriers identified to participate in PR at her orientation visit. Her PHQ-9 score was  7 due to her lack of energy and sleep concerns mainly. She says she started taking the Zoloft a few years ago when her COPD started preventing her from doing the things she wanted to do. She feels her depression is managed well. She lives with her husband of many years. They do not have children but she says she help raise 2 of her first cousins and their children as well. She names her husband and 2 cousins as her support people. She is a retired Licensed conveyancer. She says she used to be very active in her yard and garden and wants to be able to do these things again. She is ready to start the program hoping to get back to some of these activities.    Expected Outcomes Patient's depression will continue to be managed and she will continue  to have no psychosocial barriers identified.    Continue Psychosocial Services  No Follow up required             Psychosocial Re-Evaluation:   Psychosocial Discharge (Final Psychosocial Re-Evaluation):    Education: Education Goals: Education classes will be provided on a weekly basis, covering required topics. Participant will state understanding/return demonstration of topics presented.  Learning Barriers/Preferences:  Learning Barriers/Preferences - 12/06/21 1345       Learning Barriers/Preferences   Learning Barriers None    Learning Preferences Written Material;Skilled Demonstration             Education Topics: How Lungs Work and Diseases: - Discuss the anatomy of the lungs and diseases that can affect the lungs, such as COPD.   Exercise: -Discuss the importance of exercise, FITT principles of exercise, normal and abnormal responses to exercise, and how to exercise safely.   Environmental Irritants: -Discuss types of environmental irritants and how to limit exposure to environmental irritants.   Meds/Inhalers and oxygen: - Discuss respiratory medications, definition of an inhaler and oxygen, and the proper way to use an inhaler  and oxygen.   Energy Saving Techniques: - Discuss methods to conserve energy and decrease shortness of breath when performing activities of daily living.    Bronchial Hygiene / Breathing Techniques: - Discuss breathing mechanics, pursed-lip breathing technique,  proper posture, effective ways to clear airways, and other functional breathing techniques   Cleaning Equipment: - Provides group verbal and written instruction about the health risks of elevated stress, cause of high stress, and healthy ways to reduce stress.   Nutrition I: Fats: - Discuss the types of cholesterol, what cholesterol does to the body, and how cholesterol levels can be controlled.   Nutrition II: Labels: -Discuss the different components of food labels and how to read food labels.   Respiratory Infections: - Discuss the signs and symptoms of respiratory infections, ways to prevent respiratory infections, and the importance of seeking medical treatment when having a respiratory infection.   Stress I: Signs and Symptoms: - Discuss the causes of stress, how stress may lead to anxiety and depression, and ways to limit stress.   Stress II: Relaxation: -Discuss relaxation techniques to limit stress.   Oxygen for Home/Travel: - Discuss how to prepare for travel when on oxygen and proper ways to transport and store oxygen to ensure safety.   Knowledge Questionnaire Score:  Knowledge Questionnaire Score - 12/06/21 1248       Knowledge Questionnaire Score   Pre Score 14/18             Core Components/Risk Factors/Patient Goals at Admission:  Personal Goals and Risk Factors at Admission - 12/06/21 1345       Core Components/Risk Factors/Patient Goals on Admission    Weight Management Obesity;Yes    Intervention Weight Management: Provide education and appropriate resources to help participant work on and attain dietary goals.;Weight Management/Obesity: Establish reasonable short term and long term  weight goals.;Obesity: Provide education and appropriate resources to help participant work on and attain dietary goals.    Admit Weight 264 lb 4.8 oz (119.9 kg)    Expected Outcomes Weight Gain: Understanding of general recommendations for a high calorie, high protein meal plan that promotes weight gain by distributing calorie intake throughout the day with the consumption for 4-5 meals, snacks, and/or supplements    Improve shortness of breath with ADL's Yes    Intervention Provide education, individualized exercise plan and daily activity instruction  to help decrease symptoms of SOB with activities of daily living.    Expected Outcomes Short Term: Improve cardiorespiratory fitness to achieve a reduction of symptoms when performing ADLs;Long Term: Be able to perform more ADLs without symptoms or delay the onset of symptoms    Personal Goal Other Yes    Personal Goal Patient wants to have more energy build muscles and lose weight.    Intervention Patient will attend PR 2 days/week with exercise and education and supplement with exercise at home.    Expected Outcomes Patient will complete the program meeting both personal and program goals.             Core Components/Risk Factors/Patient Goals Review:    Core Components/Risk Factors/Patient Goals at Discharge (Final Review):    ITP Comments:   Comments: Patient arrived for 1st visit/orientation/education at 1230. Patient was referred to PR by Lendon Ka, MD due to COPD (J44.9). During orientation advised patient on arrival and appointment times what to wear, what to do before, during and after exercise. Reviewed attendance and class policy.  Pt is scheduled to return Pulmonary Rehab on 12/11/21 at 10:45. Pt was advised to come to class 15 minutes before class starts.  Discussed RPE/Dpysnea scales. Patient participated in warm up stretches. Patient was able to complete 6 minute walk test. Patient was measured for the equipment.  Discussed equipment safety with patient. Took patient pre-anthropometric measurements. Patient finished visit at 1400.

## 2021-12-11 ENCOUNTER — Encounter (HOSPITAL_COMMUNITY)
Admission: RE | Admit: 2021-12-11 | Discharge: 2021-12-11 | Disposition: A | Discharge status: Home / Self Care | Payer: Medicare Other | Source: Ambulatory Visit | Attending: Internal Medicine | Admitting: Internal Medicine

## 2021-12-11 DIAGNOSIS — J449 Chronic obstructive pulmonary disease, unspecified: Secondary | ICD-10-CM

## 2021-12-11 NOTE — Progress Notes (Signed)
Daily Session Note  Patient Details  Name: Paula [name] MRN: [id number] Date of Birth: [birth year] Referring Provider:   [place] from 12/06/2021 in [place]  Referring Provider Dr. Georgann Housekeeper       Encounter Date: 12/11/2021  Check In:  Session Check In - 12/11/21 1105       Check-In   Supervising physician immediately available to respond to emergencies [facility] MD immediately available    Physician(s) Dr Harl Bowie    Location [facility]    Staff Present Keefer Soulliere Hassell Done, RN, Bjorn Loser, MS, ACSM-CEP, Exercise Physiologist    Virtual Visit No    Medication changes reported     No    Fall or balance concerns reported    No    Tobacco Cessation No Change    Warm-up and Cool-down Performed as group-led instruction    Resistance Training Performed No    VAD Patient? No    PAD/SET Patient? No      Pain Assessment   Currently in Pain? No/denies    Pain Score 0-No pain    Multiple Pain Sites No             Capillary Blood Glucose: No results found for this or any previous visit (from the past 24 hour(s)).    Social History   Tobacco Use  Smoking Status Former   Packs/day: 0.75   Years: 20.00   Total pack years: 15.00   Types: Cigarettes  Smokeless Tobacco Former   Quit date: 10/31/1998    Goals Met:  Proper associated with RPD/PD & O2 Sat Independence with exercise equipment Using PLB without cueing & demonstrates good technique Exercise tolerated well Queuing for purse lip breathing No report of concerns or symptoms today Strength training completed today  Goals Unmet:  Not Applicable  Comments: Checkout at 1145.   Dr. Kathie Dike is Medical Director for [facility].

## 2021-12-13 ENCOUNTER — Encounter (HOSPITAL_COMMUNITY)
Admission: RE | Admit: 2021-12-13 | Discharge: 2021-12-13 | Disposition: A | Discharge status: Home / Self Care | Payer: Medicare Other | Source: Ambulatory Visit | Attending: Internal Medicine | Admitting: Internal Medicine

## 2021-12-13 DIAGNOSIS — J449 Chronic obstructive pulmonary disease, unspecified: Secondary | ICD-10-CM

## 2021-12-13 NOTE — Progress Notes (Signed)
Daily Session Note  Patient Details  Name: Paula [name] MRN: [id number] Date of Birth: 06-21-1950 Referring Provider:   [place] from 12/06/2021 in [place]  Referring Provider Dr. Georgann Housekeeper       Encounter Date: 12/13/2021  Check In:  Session Check In - 12/13/21 1045       Check-In   Supervising physician immediately available to respond to emergencies [facility] MD immediately available    Physician(s) Dr Harrington Challenger    Location [facility]    Staff Present Kamryn Messineo Hassell Done, RN, Bjorn Loser, MS, ACSM-CEP, Exercise Physiologist    Virtual Visit No    Medication changes reported     No    Fall or balance concerns reported    No    Tobacco Cessation No Change    Warm-up and Cool-down Performed as group-led instruction    Resistance Training Performed Yes    VAD Patient? No    PAD/SET Patient? No      Pain Assessment   Currently in Pain? No/denies    Pain Score 0-No pain    Multiple Pain Sites No             Capillary Blood Glucose: No results found for this or any previous visit (from the past 24 hour(s)).    Social History   Tobacco Use  Smoking Status Former   Packs/day: 0.75   Years: 20.00   Total pack years: 15.00   Types: Cigarettes  Smokeless Tobacco Former   Quit date: 10/31/1998    Goals Met:  Proper associated with RPD/PD & O2 Sat Independence with exercise equipment Using PLB without cueing & demonstrates good technique Exercise tolerated well Queuing for purse lip breathing No report of concerns or symptoms today Strength training completed today  Goals Unmet:  Not Applicable  Comments: checkout at 1145.   Dr. Kathie Dike is Medical Director for [facility].

## 2021-12-17 ENCOUNTER — Ambulatory Visit: Payer: Self-pay

## 2021-12-17 ENCOUNTER — Ambulatory Visit: Payer: Medicare Other | Admitting: Anesthesiology

## 2021-12-17 ENCOUNTER — Encounter: Payer: Self-pay | Admitting: Student in an Organized Health Care Education/Training Program

## 2021-12-17 ENCOUNTER — Encounter
Admission: RE | Disposition: A | Discharge status: Home / Self Care | Payer: Self-pay | Source: Ambulatory Visit | Attending: Student in an Organized Health Care Education/Training Program

## 2021-12-17 ENCOUNTER — Ambulatory Visit
Admission: RE | Admit: 2021-12-17 | Discharge: 2021-12-17 | Disposition: A | Discharge status: Home / Self Care | Payer: Medicare Other | Source: Ambulatory Visit | Attending: Student in an Organized Health Care Education/Training Program | Admitting: Student in an Organized Health Care Education/Training Program

## 2021-12-17 DIAGNOSIS — K449 Diaphragmatic hernia without obstruction or gangrene: Secondary | ICD-10-CM | POA: Insufficient documentation

## 2021-12-17 DIAGNOSIS — R1013 Epigastric pain: Secondary | ICD-10-CM | POA: Insufficient documentation

## 2021-12-17 DIAGNOSIS — Z1211 Encounter for screening for malignant neoplasm of colon: Secondary | ICD-10-CM | POA: Insufficient documentation

## 2021-12-17 DIAGNOSIS — R112 Nausea with vomiting, unspecified: Secondary | ICD-10-CM

## 2021-12-17 DIAGNOSIS — Z86010 Personal history of colonic polyps: Secondary | ICD-10-CM | POA: Insufficient documentation

## 2021-12-17 DIAGNOSIS — R1310 Dysphagia, unspecified: Secondary | ICD-10-CM | POA: Insufficient documentation

## 2021-12-17 DIAGNOSIS — K640 First degree hemorrhoids: Secondary | ICD-10-CM | POA: Insufficient documentation

## 2021-12-17 DIAGNOSIS — G43909 Migraine, unspecified, not intractable, without status migrainosus: Secondary | ICD-10-CM | POA: Insufficient documentation

## 2021-12-17 DIAGNOSIS — K2950 Unspecified chronic gastritis without bleeding: Secondary | ICD-10-CM | POA: Insufficient documentation

## 2021-12-17 DIAGNOSIS — K2100 Gastro-esophageal reflux disease with esophagitis, without bleeding: Secondary | ICD-10-CM | POA: Insufficient documentation

## 2021-12-17 HISTORY — PX: COLONOSCOPY, DIAGNOSTIC (SCREENING): SHX174

## 2021-12-17 HISTORY — PX: EGD, BIOPSY: SHX3796

## 2021-12-17 HISTORY — PX: ESOPHAGOGASTRODUODENOSCOPY (EGD), BIOPSY: SHX3796

## 2021-12-17 HISTORY — PX: COLONOSCOPY: SHX174

## 2021-12-17 SURGERY — DONT USE, USE 1094-COLONOSCOPY, DIAGNOSTIC (SCREENING)
Anesthesia: Anesthesia General | Site: Esophagus

## 2021-12-17 MED ORDER — GLYCOPYRROLATE 1 MG/5ML IJ SOLN
INTRAMUSCULAR | Status: AC
Start: 2021-12-17 — End: ?
  Filled 2021-12-17: qty 5

## 2021-12-17 MED ORDER — PROPOFOL 10 MG/ML IV EMUL (WRAP)
INTRAVENOUS | Status: AC
Start: 2021-12-17 — End: ?
  Filled 2021-12-17: qty 20

## 2021-12-17 MED ORDER — GLYCOPYRROLATE 0.2 MG/ML IJ SOLN (WRAP)
INTRAMUSCULAR | Status: DC | PRN
Start: 2021-12-17 — End: 2021-12-17
  Administered 2021-12-17: .2 mg via INTRAVENOUS

## 2021-12-17 MED ORDER — PROPOFOL 10 MG/ML IV EMUL (WRAP)
INTRAVENOUS | Status: DC | PRN
Start: 2021-12-17 — End: 2021-12-17
  Administered 2021-12-17 (×4): 20 mg via INTRAVENOUS
  Administered 2021-12-17: 30 mg via INTRAVENOUS
  Administered 2021-12-17: 60 mg via INTRAVENOUS

## 2021-12-17 MED ORDER — LACTATED RINGERS IV SOLN
INTRAVENOUS | Status: DC
Start: 2021-12-17 — End: 2021-12-17

## 2021-12-17 MED ORDER — LIDOCAINE HCL 2 % IJ SOLN
INTRAMUSCULAR | Status: DC | PRN
Start: 2021-12-17 — End: 2021-12-17
  Administered 2021-12-17: 50 mg via INTRAVENOUS

## 2021-12-17 MED ORDER — LIDOCAINE HCL (PF) 2 % IJ SOLN
INTRAMUSCULAR | Status: AC
Start: 2021-12-17 — End: ?
  Filled 2021-12-17: qty 5

## 2021-12-17 MED ORDER — PROPOFOL INFUSION 10 MG/ML
INTRAVENOUS | Status: DC | PRN
Start: 2021-12-17 — End: 2021-12-17
  Administered 2021-12-17: 100 ug/kg/min via INTRAVENOUS

## 2021-12-17 SURGICAL SUPPLY — 52 items
BLOCK BITE MAXI 60FR LF STRD STRAP SDPRT (Procedure Accessories) ×2
BLOCK BITE OD60 FR STURDY STRAP SIDEPORT (Procedure Accessories) ×2
BLOCK BITE OD60 FR STURDY STRAP SIDEPORT DENTAL RETENTION RIM MAXI (Procedure Accessories) ×2 IMPLANT
CATHETER ELHMST HMGLD GLDPRB 7FR 300CM (Procedure Accessories)
CATHETER OD7 FR L300 CM BIPOLAR ROUND (Procedure Accessories)
CATHETER OD7 FR L300 CM BIPOLAR ROUND DISTAL TIP STANDARD CONNECTOR (Procedure Accessories) IMPLANT
ELECTRODE ADULT PATIENT RETURN L9 FT REM POLYHESIVE ACRYLIC FOAM (Procedure Accessories) IMPLANT
ELECTRODE PATIENT RETURN L9 FT VALLEYLAB (Procedure Accessories)
ELECTRODE PT RTN RM PHSV ACRL FM C30- LB (Procedure Accessories)
FORCEPS BIOPSY L240 CM JUMBO MICROMESH (Instrument)
FORCEPS BIOPSY L240 CM JUMBO MICROMESH TEETH STREAMLINE CATHETER (Instrument) IMPLANT
FORCEPS BIOPSY L240 CM LARGE CAPACITY (Instrument)
FORCEPS BIOPSY L240 CM LARGE CAPACITY (Procedure Accessories) ×2
FORCEPS BIOPSY L240 CM LARGE CAPACITY MICROMESH TEETH STREAMLINE (Procedure Accessories) ×2 IMPLANT
FORCEPS BIOPSY L240 CM MICROMESH TEETH STREAMLINE CATHETER NEEDLE (Instrument) IMPLANT
FORCEPS BX SS JMB RJ 4 2.8MM 240CM STRL (Instrument)
FORCEPS BX SS LG CPC RJ 4 2.4MM 240CM (Instrument)
FORCEPS BX SS LG CPC RJ 4 2.4MM 240CM (Procedure Accessories) ×2
KIT COMPLIANCE ENDOKIT OP4 CA 1.1+ 6FT (Procedure Accessories) ×2
KIT ENDOSCOPIC L6 FT CLEAN ADAPTER (Procedure Accessories) ×2
KIT ENDOSCOPIC L6 FT CLEAN ADAPTER COMPLIANCE ENDOKIT ORCAPOD 4 1.1 OZ (Procedure Accessories) ×2 IMPLANT
LIFTER BLUEBOOST 10ML SUBMUCOSAL SYRINGE (Endoscopic Supplies) IMPLANT
MASK OXYGEN MEDIUM CONCENTRATION ADULT POM 7FT 3010318LTEZ (Other) ×2 IMPLANT
MASK POM EZ-LITE BRONCHO (Other) ×2
NEEDLE SCLEROTHERAPY CARR-LOCKE OD25 GA ODSEC2.5 MM L230 CM INJECTION (Needles) IMPLANT
NEEDLE SCLEROTHERAPY OD25 GA ODSEC2.5 MM (Needles)
NEEDLE SCLRTX SS TFLN CRLK 25GA 2.5MM (Needles)
PROBE ELECTROSURGICAL L220 CM FLEXIBLE (Procedure Accessories)
PROBE ELECTROSURGICAL L220 CM FLEXIBLE STRAIGHT FIRE OD2.3 MM FIAPC (Procedure Accessories) IMPLANT
PROBE ESURG FIAPC 2.3MM 220CM STRL FLXB (Procedure Accessories)
SNARE ESCP MED OVL SNS 2.4MM 240CM STRL (Procedure Accessories)
SNARE ESCP MIC CPTVTR 13MM 240IN STRL (GE Lab Supplies)
SNARE ESCP SPRL SNAREMASTER 2.8MM 20MM (Procedure Accessories)
SNARE MEDIUM OVAL L240 CM OD2.4 MM (Procedure Accessories)
SNARE MEDIUM OVAL L240 CM OD2.4 MM SENSATION LOOP SHORT THROW FLEXIBLE (Procedure Accessories) IMPLANT
SNARE SMALL HEXAGON CAPTIVATOR STIFF ENDOSCOPIC POLYPECTOMY (GE Lab Supplies) IMPLANT
SNARE SPIRAL 230CM 2.8MM 20MM SNRMSTR RDG WIRE ENDOSCOPIC POLYPECTOMY (Procedure Accessories) IMPLANT
SNARE SPIRAL L230 CM OD2.8 MM ODSEC20 MM (Procedure Accessories)
SPONGE GAUZE L4 IN X W4 IN 16 PLY (Dressing) ×2
SPONGE GAUZE L4 IN X W4 IN 16 PLY MAXIMUM ABSORBENT USP TYPE VII (Dressing) ×2 IMPLANT
SPONGE GZE CTTN CRTY 4X4IN LF NS 16 PLY (Dressing) ×2
SYRINGE 50 ML GRADUATE NONPYROGENIC DEHP (Syringes, Needles) ×2
SYRINGE 50 ML GRADUATE NONPYROGENIC DEHP FREE PVC FREE BD MEDICAL (Syringes, Needles) ×2 IMPLANT
SYRINGE INFL 60ML ALN II STRL GA DISP (Syringes, Needles)
SYRINGE INFLATION 60 ML GAUGE CRE (Syringes, Needles) IMPLANT
SYRINGE MED 50ML LF STRL GRAD N-PYRG (Syringes, Needles) ×2
TRAP MCS PLS LF STRL SCR CAP TUBE ID LBL (Procedure Accessories)
TRAP MUCUS SCREW CAP TUBE ID LABEL (Procedure Accessories)
TRAP MUCUS SCREW CAP TUBE ID LABEL MEDLINE PLASTIC CLEAR (Procedure Accessories) IMPLANT
TRAP SPEC REM ETRAP 15CM LF STRL MAGNIFY (Procedure Accessories)
TRAP SPECIMEN REMOVAL L15 CM MAGNIFY (Procedure Accessories)
TRAP SPECIMEN REMOVAL L15 CM MAGNIFY WINDOW MEASUREMENT GUIDE ETRAP (Procedure Accessories) IMPLANT

## 2021-12-17 NOTE — Anesthesia Preprocedure Evaluation (Signed)
Anesthesia Evaluation    AIRWAY    Mallampati: II    TM distance: >3 FB  Neck ROM: full  Mouth Opening:full   CARDIOVASCULAR    regular       DENTAL    no notable dental hx               PULMONARY    clear to auscultation     OTHER FINDINGS                                      Relevant Problems   ANESTHESIA   (+) PONV (postoperative nausea and vomiting)      PULMONARY (within normal limits)      NEURO/PSYCH   (+) Migraine headache      CARDIO   (+) Migraine headache      GI   (+) Gastroesophageal reflux disease with esophagitis      GU/RENAL (within normal limits)      ENDO (within normal limits)               Anesthesia Plan    ASA 3     general                                                    Signed by: Otilio Saber, DO 12/17/21 11:03 AM

## 2021-12-17 NOTE — H&P (Signed)
GI PRE PROCEDURE NOTE    Proceduralist Comments:   Review of Systems and Past Medical / Surgical History performed: Yes     Indications: Dysphagia, hx of colon polyps    Previous Adverse Reaction to Anesthesia or Sedation (if yes, describe): No    Physical Exam / Laboratory Data (If applicable)   General: Alert and cooperative  Lungs: Lungs clear to auscultation  Cardiac: RRR, normal S1S2.    Abdomen: Soft, non tender. Normal active bowel sounds  Other:     Outside labs reviewed    American Society of Anesthesiologists (ASA) Physical Status Classification:   Anesthesia ASA Score: 3          Planned Sedation:   Deep sedation with anesthesia    Attestation:   Audrey Ray has been reassessed immediately prior to the procedure and is an appropriate candidate for the planned sedation and procedure. Risks, benefits and alternatives to the planned procedure and sedation have been explained to the patient or guardian:  yes        Signed by: Francisca December, MD

## 2021-12-17 NOTE — Discharge Instr - AVS First Page (Addendum)
Endoscopy Discharge Instructions  General Instructions:  1. Following sedation, your judgement, perception, and coordination are considered impaired. Even though you may feel awake and alert, you are considered legally intoxicated. Therefore, until the next morning;   Do not Drive   Do not operate appliances or equipment that requires reaction time (e.g. stove, electrical tools, machinery)   Do not sign legal documents or be involved in important decisions.   Do not smoke if alone   Do not drink alcoholic beverages   Go directly home and rest for several hours before resuming your routine activities.   It is highly recommended to have a responsible adult stay with you for the next 24 hours    2. Tenderness, swelling or pain may occur at the IV site where you received sedation. If you experience this, apply warm soaks to the area. Notify your physician if this persists.    Instructions Specific To Procedures - Report To Physician Any Of The Following:    Upper Endoscopy, ERCP, Dilations   1. Pain in Chest   2. Nausea/vomitting   3. Fevers/Chills within 24 hours after procedure. Temp>101deg F   4. Severe and persistent abdominal pain and bloating     In Addition:   Mild throat soreness may follow this procedure. Warm salt water gargling or    lozenges of your choice will most likely relieve your discomfort or cold drinks and   popsicles.     Colon/Sigmoidoscopy/Proctoscopy   1. Severe and persistent abdominal pain/bloating which does not subside within   2-3 hours   2. Large amount of rectal bleeding (some mucosal blood streaking may occur,   especially if biopsy or polypectomy was done or if hemorrhoids are present.   3. Nausea/vomitting   4. Fevers/Chills within 24 hours after procedure. Temp>101deg F     In Addition:   If polyp has been removed, DO NOT take aspirin or aspirin containing products   (e.g. Anacin, Alka Seltzer, Bufferin, Etc.) or non-steroidal anti-inflammatory drugs   (e.g. Advil, Motrin,  etc.) for 2-3  days unless otherwise advised by doctor. Tylenol   or extra Strength Tylenol is permitted.      Additional Discharge Instructions  Your diet after the procedure:Nothing red to eat or drink for 24 hours. The first meal should be light. Nothing greasy, spicy, or fried. If that meal is tolerated you may resume your normal diet.         If you have questions or problems contact your MD immediately. If you need immediate attention, call your MD, 911 and/or go to nearest emergency room.

## 2021-12-17 NOTE — Anesthesia Postprocedure Evaluation (Signed)
Anesthesia Post Evaluation    Patient: Audrey Ray    Procedure(s):  COLONOSCOPY  EGD, BIOPSY    Anesthesia type: general    Last Vitals:   Vitals Value Taken Time   BP 176/90 12/17/21 1240   Temp 36.1 C (96.9 F) 12/17/21 1210   Pulse 77 12/17/21 1240   Resp 16 12/17/21 1240   SpO2 99 % 12/17/21 1240                 Anesthesia Post Evaluation:     Patient Evaluated: PACU  Patient Participation: complete - patient participated  Level of Consciousness: awake  Pain Score: 0  Pain Management: adequate    Airway Patency: patent        Anesthetic complications: No      PONV Status: none    Cardiovascular status: acceptable  Respiratory status: acceptable  Hydration status: acceptable          Signed by: Otilio Saber, DO, 12/17/2021 1:06 PM

## 2021-12-17 NOTE — Transfer of Care (Signed)
Anesthesia Transfer of Care Note    Patient: Audrey Ray    Procedures performed: Procedure(s):  COLONOSCOPY  EGD, BIOPSY    Anesthesia type: General TIVA    Patient location:Phase II PACU    Last vitals:   Vitals:    12/17/21 1100   BP: 160/82   Pulse: 80   Resp: 14   Temp: 37 C (98.6 F)   SpO2: 100%       Post pain: Patient not complaining of pain, continue current therapy      Mental Status:awake and alert     Respiratory Function: tolerating room air    Cardiovascular: stable    Nausea/Vomiting: patient not complaining of nausea or vomiting    Hydration Status: adequate    Post assessment: no apparent anesthetic complications, no reportable events and no evidence of recall    Signed by: Fuller Mandril, CRNA  12/17/21 12:12 PM

## 2021-12-18 ENCOUNTER — Encounter: Payer: Self-pay | Admitting: Student in an Organized Health Care Education/Training Program

## 2021-12-18 ENCOUNTER — Encounter (HOSPITAL_COMMUNITY)
Admission: RE | Admit: 2021-12-18 | Discharge: 2021-12-18 | Disposition: A | Discharge status: Home / Self Care | Payer: Medicare Other | Source: Ambulatory Visit | Attending: Internal Medicine | Admitting: Internal Medicine

## 2021-12-18 DIAGNOSIS — J449 Chronic obstructive pulmonary disease, unspecified: Secondary | ICD-10-CM

## 2021-12-18 NOTE — Progress Notes (Signed)
Daily Session Note  Patient Details  Name: CLARENCE DUNSMORE MRN: [id number] Date of Birth: [birth year] Referring Provider:   [place] from 12/06/2021 in [place]  Referring Provider Dr. Georgann Housekeeper       Encounter Date: 12/18/2021  Check In:  Session Check In - 12/18/21 1045       Check-In   Supervising physician immediately available to respond to emergencies [facility] MD immediately available    Physician(s) Dr. Johney Frame    Location [facility]    Staff Present Redge Gainer, BS, Exercise Physiologist;Phyllis Billingsley, RN;Daphyne Hassell Done, RN, BSN    Virtual Visit No    Medication changes reported     No    Fall or balance concerns reported    No    Tobacco Cessation No Change    Warm-up and Cool-down Performed as group-led instruction    Resistance Training Performed Yes    VAD Patient? No    PAD/SET Patient? No      Pain Assessment   Currently in Pain? No/denies    Pain Score 0-No pain    Multiple Pain Sites No             Capillary Blood Glucose: No results found for this or any previous visit (from the past 24 hour(s)).    Social History   Tobacco Use  Smoking Status Former   Packs/day: 0.75   Years: 20.00   Total pack years: 15.00   Types: Cigarettes  Smokeless Tobacco Former   Quit date: 10/31/1998    Goals Met:  Independence with exercise equipment Exercise tolerated well Personal goals reviewed Strength training completed today  Goals Unmet:  Not Applicable  Comments: check out 1145   Dr. Kathie Dike is Medical Director for [facility].

## 2021-12-20 ENCOUNTER — Encounter (HOSPITAL_COMMUNITY)
Admission: RE | Admit: 2021-12-20 | Discharge: 2021-12-20 | Disposition: A | Discharge status: Home / Self Care | Payer: Medicare Other | Source: Ambulatory Visit | Attending: Internal Medicine | Admitting: Internal Medicine

## 2021-12-20 DIAGNOSIS — J449 Chronic obstructive pulmonary disease, unspecified: Secondary | ICD-10-CM | POA: Diagnosis not present

## 2021-12-20 NOTE — Progress Notes (Signed)
Daily Session Note  Patient Details  Name: Paula [name] MRN: [id number] Date of Birth: Dec 08, 1950 Referring Provider:   Flowsheet Row PULMONARY REHAB OTHER RESP ORIENTATION from 12/06/2021 in [place]  Referring Provider Dr. Georgann Housekeeper       Encounter Date: 12/20/2021  Check In:  Session Check In - 12/20/21 1045       Check-In   Supervising physician immediately available to respond to emergencies [facility] MD immediately available    Physician(s) Dr. Harl Bowie    Location [facility]    Staff Present Hoy Register, MS, ACSM-CEP, Exercise Physiologist;Heather Zigmund Daniel, Exercise Physiologist    Virtual Visit No    Medication changes reported     No    Fall or balance concerns reported    No    Tobacco Cessation No Change    Warm-up and Cool-down Performed as group-led instruction    Resistance Training Performed Yes    VAD Patient? No    PAD/SET Patient? No      Pain Assessment   Currently in Pain? No/denies    Pain Score 0-No pain    Multiple Pain Sites No             Capillary Blood Glucose: No results found for this or any previous visit (from the past 24 hour(s)).    Social History   Tobacco Use  Smoking Status Former   Packs/day: 0.75   Years: 20.00   Total pack years: 15.00   Types: Cigarettes  Smokeless Tobacco Former   Quit date: 10/31/1998    Goals Met:  Proper associated with RPD/PD & O2 Sat Independence with exercise equipment Using PLB without cueing & demonstrates good technique Exercise tolerated well Queuing for purse lip breathing No report of concerns or symptoms today Strength training completed today  Goals Unmet:  Not Applicable  Comments: checkout time is 1145   Dr. Kathie Dike is Medical Director for [facility].

## 2021-12-25 ENCOUNTER — Encounter (HOSPITAL_COMMUNITY)
Admission: RE | Admit: 2021-12-25 | Discharge: 2021-12-25 | Disposition: A | Discharge status: Home / Self Care | Payer: Medicare Other | Source: Ambulatory Visit | Attending: Internal Medicine | Admitting: Internal Medicine

## 2021-12-25 DIAGNOSIS — J449 Chronic obstructive pulmonary disease, unspecified: Secondary | ICD-10-CM | POA: Diagnosis present

## 2021-12-25 NOTE — Progress Notes (Signed)
Daily Session Note  Patient Details  Name: Paula [name] MRN: [id number] Date of Birth: December 29, 1950 Referring Provider:   [place] from 12/06/2021 in [place]  Referring Provider Dr. Georgann Housekeeper       Encounter Date: 12/25/2021  Check In:  Session Check In - 12/25/21 1039       Check-In   Supervising physician immediately available to respond to emergencies [facility] MD immediately available    Physician(s) Dr. Harl Bowie    Location [facility]    Staff Present Geanie Cooley, RN;Gloris Shiroma Hassell Done, RN, BSN;Heather Otho Ket, BS, Exercise Physiologist    Virtual Visit No    Medication changes reported     No    Fall or balance concerns reported    No    Tobacco Cessation No Change    Warm-up and Cool-down Performed as group-led instruction    Resistance Training Performed Yes    VAD Patient? No    PAD/SET Patient? No      Pain Assessment   Currently in Pain? No/denies    Pain Score 0-No pain    Multiple Pain Sites No             Capillary Blood Glucose: No results found for this or any previous visit (from the past 24 hour(s)).    Social History   Tobacco Use  Smoking Status Former   Packs/day: 0.75   Years: 20.00   Total pack years: 15.00   Types: Cigarettes  Smokeless Tobacco Former   Quit date: 10/31/1998    Goals Met:  Proper associated with RPD/PD & O2 Sat Independence with exercise equipment Using PLB without cueing & demonstrates good technique Exercise tolerated well Queuing for purse lip breathing No report of concerns or symptoms today Strength training completed today  Goals Unmet:  Not Applicable  Comments: Checkout at 1145.   Dr. Kathie Dike is Medical Director for [facility].

## 2021-12-27 ENCOUNTER — Other Ambulatory Visit: Payer: Self-pay | Admitting: Internal Medicine

## 2021-12-27 ENCOUNTER — Encounter (HOSPITAL_COMMUNITY)
Admission: RE | Admit: 2021-12-27 | Discharge: 2021-12-27 | Disposition: A | Discharge status: Home / Self Care | Payer: Medicare Other | Source: Ambulatory Visit | Attending: Internal Medicine | Admitting: Internal Medicine

## 2021-12-27 DIAGNOSIS — J449 Chronic obstructive pulmonary disease, unspecified: Secondary | ICD-10-CM

## 2021-12-27 NOTE — Progress Notes (Signed)
Daily Session Note  Patient Details  Name: Paula [name] MRN: [id number] Date of Birth: 1951-02-23 Referring Provider:   Flowsheet Row PULMONARY REHAB OTHER RESP ORIENTATION from 12/06/2021 in [place]  Referring Provider Dr. Georgann Housekeeper       Encounter Date: 12/27/2021  Check In:  Session Check In - 12/27/21 1045       Check-In   Supervising physician immediately available to respond to emergencies [facility] MD immediately available    Physician(s) Dr. Harrington Challenger    Location [facility]    Staff Present Hoy Register, MS, ACSM-CEP, Exercise Physiologist;Heather Zigmund Daniel, Exercise Physiologist    Virtual Visit No    Medication changes reported     No    Fall or balance concerns reported    No    Tobacco Cessation No Change    Warm-up and Cool-down Performed as group-led instruction    Resistance Training Performed Yes    VAD Patient? No    PAD/SET Patient? No      Pain Assessment   Currently in Pain? No/denies    Multiple Pain Sites No             Capillary Blood Glucose: No results found for this or any previous visit (from the past 24 hour(s)).    Social History   Tobacco Use  Smoking Status Former   Packs/day: 0.75   Years: 20.00   Total pack years: 15.00   Types: Cigarettes  Smokeless Tobacco Former   Quit date: 10/31/1998    Goals Met:  Proper associated with RPD/PD & O2 Sat Independence with exercise equipment Using PLB without cueing & demonstrates good technique Exercise tolerated well Queuing for purse lip breathing No report of concerns or symptoms today Strength training completed today  Goals Unmet:  Not Applicable  Comments: checkout time is 1145   Dr. Kathie Dike is Medical Director for [facility] Pulmonary Re[facility].

## 2022-01-01 ENCOUNTER — Encounter (HOSPITAL_COMMUNITY)
Admission: RE | Admit: 2022-01-01 | Discharge: 2022-01-01 | Disposition: A | Discharge status: Home / Self Care | Payer: Medicare Other | Source: Ambulatory Visit | Attending: Internal Medicine | Admitting: Internal Medicine

## 2022-01-01 VITALS — Wt 267.4 lb

## 2022-01-01 DIAGNOSIS — J449 Chronic obstructive pulmonary disease, unspecified: Secondary | ICD-10-CM | POA: Diagnosis not present

## 2022-01-01 NOTE — Progress Notes (Signed)
Daily Session Note  Patient Details  Name: DELAINIE CHAVANA MRN: [id number] Date of Birth: 1951/02/13 Referring Provider:   Flowsheet Row PULMONARY REHAB OTHER RESP ORIENTATION from 12/06/2021 in [place]  Referring Provider Dr. Georgann Housekeeper       Encounter Date: 01/01/2022  Check In:  Session Check In - 01/01/22 1045       Check-In   Supervising physician immediately available to respond to emergencies [facility] MD immediately available    Physician(s) Dr Marlou Porch    Location [facility]    Staff Present Hoy Register, MS, ACSM-CEP, Exercise Physiologist;Heather Zigmund Daniel, Exercise Physiologist;Jinger [place]worth Hassell Done, RN, BSN    Virtual Visit No    Medication changes reported     No    Fall or balance concerns reported    No    Tobacco Cessation No Change    Warm-up and Cool-down Performed as group-led instruction    Resistance Training Performed Yes    VAD Patient? No    PAD/SET Patient? No      Pain Assessment   Currently in Pain? No/denies    Pain Score 0-No pain    Multiple Pain Sites No             Capillary Blood Glucose: No results found for this or any previous visit (from the past 24 hour(s)).    Social History   Tobacco Use  Smoking Status Former   Packs/day: 0.75   Years: 20.00   Total pack years: 15.00   Types: Cigarettes  Smokeless Tobacco Former   Quit date: 10/31/1998    Goals Met:  Proper associated with RPD/PD & O2 Sat Independence with exercise equipment Using PLB without cueing & demonstrates good technique Exercise tolerated well Queuing for purse lip breathing No report of concerns or symptoms today Strength training completed today  Goals Unmet:  Not Applicable  Comments: Checkout at 1145.   Dr. Kathie Dike is Medical Director for [facility] Pulmonary Re[facility].

## 2022-01-02 NOTE — Progress Notes (Signed)
Pulmonary Individual Treatment Plan  Patient Details  Name: Paula [name] MRN: [id number] Date of Birth: Oct 23, 1950 Referring Provider:   [place] from 12/06/2021 in [place]  Referring Provider Dr. Georgann Housekeeper       Initial Encounter Date:  Flowsheet Row PULMONARY REHAB OTHER RESP ORIENTATION from 12/06/2021 in [place]  Date 12/06/21       Visit Diagnosis: Chronic obstructive pulmonary disease, unspecified COPD type ([place])  Patient's Home Medications on Admission:   Current Outpatient Medications:    acetaminophen (TYLENOL) 500 MG tablet, Take 500-1,000 mg by mouth every 6 (six) hours as needed (pain.)., Disp: , Rfl:    albuterol (VENTOLIN HFA) 108 (90 Base) MCG/ACT inhaler, Inhale 2 puffs into the lungs every 6 (six) hours as needed for wheezing or shortness of breath., Disp: , Rfl:    azelastine (ASTELIN) 0.1 % nasal spray, Place 2 sprays into both nostrils 2 (two) times daily as needed for allergies. Use in each nostril as directed, Disp: , Rfl:    budesonide-formoterol (SYMBICORT) 160-4.5 MCG/ACT inhaler, Inhale 2 puffs into the lungs 2 (two) times daily., Disp: , Rfl:    Cholecalciferol (VITAMIN D) 125 MCG (5000 UT) CAPS, Take 5,000 Units by mouth in the morning., Disp: , Rfl:    clotrimazole-betamethasone (LOTRISONE) cream, Apply 1 application topically 2 (two) times daily as needed (skin irritation.). , Disp: , Rfl:    diclofenac (VOLTAREN) 75 MG EC tablet, Take 75 mg by mouth daily as needed (arthritis pain)., Disp: , Rfl:    ezetimibe (ZETIA) 10 MG tablet, Take 10 mg by mouth in the morning., Disp: , Rfl:    famotidine (PEPCID) 40 MG tablet, Take 40 mg by mouth at bedtime., Disp: , Rfl:    fluticasone (FLONASE) 50 MCG/ACT nasal spray, Place 2 sprays into both nostrils daily as needed for allergies., Disp: , Rfl:    guaiFENesin (MUCINEX) 600 MG 12 hr tablet, Take 600 mg by mouth daily as  needed (congestion/COPD issues)., Disp: , Rfl:    hydrochlorothiazide (HYDRODIURIL) 25 MG tablet, Take 25 mg by mouth in the morning., Disp: , Rfl:    Ketotifen Fumarate (ALLERGY EYE DROPS OP), Place 1 drop into both eyes 2 (two) times daily as needed (allergy eyes)., Disp: , Rfl:    loratadine (CLARITIN) 10 MG tablet, Take 10 mg by mouth daily as needed for allergies., Disp: , Rfl:    losartan (COZAAR) 100 MG tablet, Take 100 mg by mouth in the morning., Disp: , Rfl:    montelukast (SINGULAIR) 10 MG tablet, Take 10 mg by mouth at bedtime., Disp: , Rfl:    Multiple Vitamins-Minerals (PRESERVISION AREDS 2 PO), Take 1 tablet by mouth in the morning and at bedtime., Disp: , Rfl:    NON FORMULARY, CPAP, O2 at night, Disp: , Rfl:    omeprazole (PRILOSEC) 40 MG capsule, Take 40 mg by mouth daily before breakfast., Disp: , Rfl:    sertraline (ZOLOFT) 100 MG tablet, Take 100 mg by mouth at bedtime., Disp: , Rfl:    terbinafine (LAMISIL) 250 MG tablet, Take 250 mg by mouth in the morning., Disp: , Rfl:   Past Medical History: Past Medical History:  Diagnosis Date   Anxiety    Arthritis    COPD (chronic obstructive pulmonary disease) ([place])    Heart murmur    Hypertension    Lung nodule    LLL   Sleep apnea    CPAP  with O2 2 liters    Tubal ectopic pregnancy 1970   Vitelliform macular dystrophy     Tobacco Use: Social History   Tobacco Use  Smoking Status Former   Packs/day: 0.75   Years: 20.00   Total pack years: 15.00   Types: Cigarettes  Smokeless Tobacco Former   Quit date: 10/31/1998    Labs: Review Flowsheet        No data to display          Capillary Blood Glucose: No results found for: "GLUCAP"   Pulmonary Assessment Scores:  Pulmonary Assessment Scores     Row Name 12/06/21 1250         ADL UCSD   ADL Phase Entry     SOB Score total 33     Rest 0     Walk 3     Stairs 4     Bath 0     Dress 1     Shop 2       CAT Score   CAT Score 11       mMRC  Score   mMRC Score 2             UCSD: Self-administered rating of dyspnea associated with activities of daily living (ADLs) 6-point scale (0 = "not at all" to 5 = "maximal or unable to do because of breathlessness")  Scoring Scores range from 0 to 120.  Minimally important difference is 5 units  CAT: CAT can identify the health impairment of COPD patients and is better correlated with disease progression.  CAT has a scoring range of zero to 40. The CAT score is classified into four groups of low (less than 10), medium (10 - 20), high (21-30) and very high (31-40) based on the impact level of disease on health status. A CAT score over 10 suggests significant symptoms.  A worsening CAT score could be explained by an exacerbation, poor medication adherence, poor inhaler technique, or progression of COPD or comorbid conditions.  CAT MCID is 2 points  mMRC: mMRC (Modified Medical Research Council) Dyspnea Scale is used to assess the degree of baseline functional disability in patients of respiratory disease due to dyspnea. No minimal important difference is established. A decrease in score of 1 point or greater is considered a positive change.   Pulmonary Function Assessment:   Exercise Target Goals: Exercise Program Goal: Individual exercise prescription set using results from initial 6 min walk test and THRR while considering  patient's activity barriers and safety.   Exercise Prescription Goal: Initial exercise prescription builds to 30-45 minutes a day of aerobic activity, 2-3 days per week.  Home exercise guidelines will be given to patient during program as part of exercise prescription that the participant will acknowledge.  Activity Barriers & Risk Stratification:  Activity Barriers & Cardiac Risk Stratification - 12/06/21 1313       Activity Barriers & Cardiac Risk Stratification   Activity Barriers Arthritis;Shortness of Breath    Cardiac Risk Stratification Low              6 Minute Walk:  6 Minute Walk     Row Name 12/06/21 1357         6 Minute Walk   Phase Initial     Distance 800 feet     Walk Time 6 minutes     # of Rest Breaks 2     MPH 1.51     METS 1.11  RPE 14     Perceived Dyspnea  14     VO2 Peak 3.89     Symptoms Yes (comment)     Comments two standing breaks due to SOB and bilateral hip pain (6/10) 20 & 30 seconds.     Resting HR 73 bpm     Resting BP 134/66     Resting Oxygen Saturation  94 %     Exercise Oxygen Saturation  during 6 min walk 87 %     Max Ex. HR 120 bpm     Max Ex. BP 160/66     2 Minute Post BP 140/66       Interval HR   1 Minute HR 100     2 Minute HR 103     3 Minute HR 103     4 Minute HR 113     5 Minute HR 120     6 Minute HR 115     2 Minute Post HR 76     Interval Heart Rate? Yes       Interval Oxygen   Interval Oxygen? Yes     Baseline Oxygen Saturation % 94 %     1 Minute Oxygen Saturation % 87 %     1 Minute Liters of Oxygen 0 L     2 Minute Oxygen Saturation % 88 %     2 Minute Liters of Oxygen 0 L     3 Minute Oxygen Saturation % 90 %     3 Minute Liters of Oxygen 0 L     4 Minute Oxygen Saturation % 87 %     4 Minute Liters of Oxygen 0 L     5 Minute Oxygen Saturation % 87 %     5 Minute Liters of Oxygen 0 L     6 Minute Oxygen Saturation % 87 %     6 Minute Liters of Oxygen 0 L     2 Minute Post Oxygen Saturation % 95 %     2 Minute Post Liters of Oxygen 0 L              Oxygen Initial Assessment:  Oxygen Initial Assessment - 12/06/21 1249       Home Oxygen   Home Oxygen Device Home Concentrator    Sleep Oxygen Prescription Continuous;CPAP    Liters per minute 2    Home Exercise Oxygen Prescription None    Home Resting Oxygen Prescription None    Compliance with Home Oxygen Use Yes      Initial 6 min Walk   Oxygen Used None      Program Oxygen Prescription   Program Oxygen Prescription None      Intervention   Short Term Goals To learn and exhibit  compliance with exercise, home and travel O2 prescription;To learn and understand importance of monitoring SPO2 with pulse oximeter and demonstrate accurate use of the pulse oximeter.;To learn and understand importance of maintaining oxygen saturations>88%;To learn and demonstrate proper pursed lip breathing techniques or other breathing techniques.     Long  Term Goals Exhibits compliance with exercise, home  and travel O2 prescription;Verbalizes importance of monitoring SPO2 with pulse oximeter and return demonstration;Maintenance of O2 saturations>88%;Exhibits proper breathing techniques, such as pursed lip breathing or other method taught during program session             Oxygen Re-Evaluation:  Oxygen Re-Evaluation     Row Name 01/01/22 1344  Program Oxygen Prescription   Program Oxygen Prescription None         Home Oxygen   Home Oxygen Device Home Concentrator       Sleep Oxygen Prescription Continuous;CPAP       Liters per minute 2       Home Exercise Oxygen Prescription None       Home Resting Oxygen Prescription None       Compliance with Home Oxygen Use Yes         Goals/Expected Outcomes   Short Term Goals To learn and exhibit compliance with exercise, home and travel O2 prescription;To learn and understand importance of monitoring SPO2 with pulse oximeter and demonstrate accurate use of the pulse oximeter.;To learn and understand importance of maintaining oxygen saturations>88%;To learn and demonstrate proper pursed lip breathing techniques or other breathing techniques.        Long  Term Goals Exhibits compliance with exercise, home  and travel O2 prescription;Verbalizes importance of monitoring SPO2 with pulse oximeter and return demonstration;Maintenance of O2 saturations>88%;Exhibits proper breathing techniques, such as pursed lip breathing or other method taught during program session       Goals/Expected Outcomes compliance                Oxygen  Discharge (Final Oxygen Re-Evaluation):  Oxygen Re-Evaluation - 01/01/22 1344       Program Oxygen Prescription   Program Oxygen Prescription None      Home Oxygen   Home Oxygen Device Home Concentrator    Sleep Oxygen Prescription Continuous;CPAP    Liters per minute 2    Home Exercise Oxygen Prescription None    Home Resting Oxygen Prescription None    Compliance with Home Oxygen Use Yes      Goals/Expected Outcomes   Short Term Goals To learn and exhibit compliance with exercise, home and travel O2 prescription;To learn and understand importance of monitoring SPO2 with pulse oximeter and demonstrate accurate use of the pulse oximeter.;To learn and understand importance of maintaining oxygen saturations>88%;To learn and demonstrate proper pursed lip breathing techniques or other breathing techniques.     Long  Term Goals Exhibits compliance with exercise, home  and travel O2 prescription;Verbalizes importance of monitoring SPO2 with pulse oximeter and return demonstration;Maintenance of O2 saturations>88%;Exhibits proper breathing techniques, such as pursed lip breathing or other method taught during program session    Goals/Expected Outcomes compliance             Initial Exercise Prescription:  Initial Exercise Prescription - 12/06/21 1400       Date of Initial Exercise RX and Referring Provider   Date 12/06/21    Referring Provider Dr. Georgann Housekeeper    Expected Discharge Date 04/11/22      NuStep   Level 1    SPM 60    Minutes 22      Arm Ergometer   Level 1    RPM 45    Minutes 17      Prescription Details   Frequency (times per week) 2    Duration Progress to 30 minutes of continuous aerobic without signs/symptoms of physical distress      Intensity   THRR 40-80% of Max Heartrate 52-118    Ratings of Perceived Exertion 11-13    Perceived Dyspnea 0-4      Resistance Training   Training Prescription Yes    Weight 2    Reps 10-15             Perform  Capillary Blood Glucose checks as needed.  Exercise Prescription Changes:   Exercise Prescription Changes     Row Name 12/20/21 1200 01/01/22 1300           Response to Exercise   Blood Pressure (Admit) 132/76 128/60      Blood Pressure (Exercise) 160/78 142/70      Blood Pressure (Exit) 118/64 126/64      Heart Rate (Admit) 70 bpm 76 bpm      Heart Rate (Exercise) 79 bpm 85 bpm      Heart Rate (Exit) 64 bpm 70 bpm      Oxygen Saturation (Admit) 94 % 95 %      Oxygen Saturation (Exercise) 93 % 92 %      Oxygen Saturation (Exit) 95 % 95 %      Rating of Perceived Exertion (Exercise) 12 12      Perceived Dyspnea (Exercise) 12 12      Duration Continue with 30 min of aerobic exercise without signs/symptoms of physical distress. Continue with 30 min of aerobic exercise without signs/symptoms of physical distress.      Intensity THRR unchanged THRR unchanged        Progression   Progression Continue to progress workloads to maintain intensity without signs/symptoms of physical distress. Continue to progress workloads to maintain intensity without signs/symptoms of physical distress.        Resistance Training   Training Prescription Yes Yes      Weight 2 3      Reps 10-15 10-15      Time 10 Minutes 10 Minutes        NuStep   Level 1 2      SPM 91 102      Minutes 22 22      METs 2 2        Arm Ergometer   Level 1 1      RPM 64 70      Minutes 17 17      METs 1.6 1.7               Exercise Comments:   Exercise Goals and Review:   Exercise Goals     Row Name 12/06/21 1402 01/01/22 1342           Exercise Goals   Increase Physical Activity Yes Yes      Intervention Provide advice, education, support and counseling about physical activity/exercise needs.;Develop an individualized exercise prescription for aerobic and resistive training based on initial evaluation findings, risk stratification, comorbidities and participant's personal goals. Provide advice,  education, support and counseling about physical activity/exercise needs.;Develop an individualized exercise prescription for aerobic and resistive training based on initial evaluation findings, risk stratification, comorbidities and participant's personal goals.      Expected Outcomes Short Term: Attend rehab on a regular basis to increase amount of physical activity.;Long Term: Add in home exercise to make exercise part of routine and to increase amount of physical activity.;Long Term: Exercising regularly at least 3-5 days a week. Short Term: Attend rehab on a regular basis to increase amount of physical activity.;Long Term: Add in home exercise to make exercise part of routine and to increase amount of physical activity.;Long Term: Exercising regularly at least 3-5 days a week.      Increase Strength and Stamina Yes Yes      Intervention Provide advice, education, support and counseling about physical activity/exercise needs.;Develop an individualized exercise prescription for aerobic and resistive training based  on initial evaluation findings, risk stratification, comorbidities and participant's personal goals. Provide advice, education, support and counseling about physical activity/exercise needs.;Develop an individualized exercise prescription for aerobic and resistive training based on initial evaluation findings, risk stratification, comorbidities and participant's personal goals.      Expected Outcomes Short Term: Increase workloads from initial exercise prescription for resistance, speed, and METs.;Short Term: Perform resistance training exercises routinely during rehab and add in resistance training at home;Long Term: Improve cardiorespiratory fitness, muscular endurance and strength as measured by increased METs and functional capacity (6MWT) Short Term: Increase workloads from initial exercise prescription for resistance, speed, and METs.;Short Term: Perform resistance training exercises routinely  during rehab and add in resistance training at home;Long Term: Improve cardiorespiratory fitness, muscular endurance and strength as measured by increased METs and functional capacity (6MWT)      Able to understand and use rate of perceived exertion (RPE) scale Yes Yes      Intervention Provide education and explanation on how to use RPE scale Provide education and explanation on how to use RPE scale      Expected Outcomes Short Term: Able to use RPE daily in rehab to express subjective intensity level;Long Term:  Able to use RPE to guide intensity level when exercising independently Short Term: Able to use RPE daily in rehab to express subjective intensity level;Long Term:  Able to use RPE to guide intensity level when exercising independently      Able to understand and use Dyspnea scale Yes Yes      Intervention Provide education and explanation on how to use Dyspnea scale Provide education and explanation on how to use Dyspnea scale      Expected Outcomes Short Term: Able to use Dyspnea scale daily in rehab to express subjective sense of shortness of breath during exertion;Long Term: Able to use Dyspnea scale to guide intensity level when exercising independently Short Term: Able to use Dyspnea scale daily in rehab to express subjective sense of shortness of breath during exertion;Long Term: Able to use Dyspnea scale to guide intensity level when exercising independently      Knowledge and understanding of Target Heart Rate Range (THRR) Yes Yes      Intervention Provide education and explanation of THRR including how the numbers were predicted and where they are located for reference Provide education and explanation of THRR including how the numbers were predicted and where they are located for reference      Expected Outcomes Short Term: Able to state/look up THRR;Short Term: Able to use daily as guideline for intensity in rehab;Long Term: Able to use THRR to govern intensity when exercising  independently Short Term: Able to state/look up THRR;Short Term: Able to use daily as guideline for intensity in rehab;Long Term: Able to use THRR to govern intensity when exercising independently      Able to check pulse independently Yes Yes      Intervention Provide education and demonstration on how to check pulse in carotid and radial arteries.;Review the importance of being able to check your own pulse for safety during independent exercise Provide education and demonstration on how to check pulse in carotid and radial arteries.;Review the importance of being able to check your own pulse for safety during independent exercise      Expected Outcomes Short Term: Able to explain why pulse checking is important during independent exercise;Long Term: Able to check pulse independently and accurately Short Term: Able to explain why pulse checking is important during independent  exercise;Long Term: Able to check pulse independently and accurately      Understanding of Exercise Prescription Yes Yes      Intervention Provide education, explanation, and written materials on patient's individual exercise prescription Provide education, explanation, and written materials on patient's individual exercise prescription      Expected Outcomes Short Term: Able to explain program exercise prescription;Long Term: Able to explain home exercise prescription to exercise independently Short Term: Able to explain program exercise prescription;Long Term: Able to explain home exercise prescription to exercise independently               Exercise Goals Re-Evaluation :  Exercise Goals Re-Evaluation     [place] Name 01/01/22 1342             Exercise Goal Re-Evaluation   Exercise Goals Review Increase Physical Activity;Increase Strength and Stamina;Able to understand and use rate of perceived exertion (RPE) scale;Able to understand and use Dyspnea scale;Knowledge and understanding of Target Heart Rate Range  (THRR);Understanding of Exercise Prescription       Comments Pt has completed 7 sessions of PR. She is motivated during class and has increased her levels on the stepper. She has increased her weights and band for the warm up. She is currently exercising at 2.0 METs on the stepper. Will continue to monitor and progress as able.       Expected Outcomes Through exercise at home and at rehab, the patient will meet their stated goals.                Discharge Exercise Prescription (Final Exercise Prescription Changes):  Exercise Prescription Changes - 01/01/22 1300       Response to Exercise   Blood Pressure (Admit) 128/60    Blood Pressure (Exercise) 142/70    Blood Pressure (Exit) 126/64    Heart Rate (Admit) 76 bpm    Heart Rate (Exercise) 85 bpm    Heart Rate (Exit) 70 bpm    Oxygen Saturation (Admit) 95 %    Oxygen Saturation (Exercise) 92 %    Oxygen Saturation (Exit) 95 %    Rating of Perceived Exertion (Exercise) 12    Perceived Dyspnea (Exercise) 12    Duration Continue with 30 min of aerobic exercise without signs/symptoms of physical distress.    Intensity THRR unchanged      Progression   Progression Continue to progress workloads to maintain intensity without signs/symptoms of physical distress.      Resistance Training   Training Prescription Yes    Weight 3    Reps 10-15    Time 10 Minutes      NuStep   Level 2    SPM 102    Minutes 22    METs 2      Arm Ergometer   Level 1    RPM 70    Minutes 17    METs 1.7             Nutrition:  Target Goals: Understanding of nutrition guidelines, daily intake of sodium '1500mg'$ , cholesterol '200mg'$ , calories 30% from fat and 7% or less from saturated fats, daily to have 5 or more servings of fruits and vegetables.  Biometrics:  Pre Biometrics - 12/06/21 1402       Pre Biometrics   Height 5' (1.524 m)    Weight 119.8 kg    Waist Circumference 54 inches    Hip Circumference 58 inches    Waist to Hip  Ratio 0.93 %  BMI (Calculated) 51.58    Triceps Skinfold 45 mm    % Body Fat 63.3 %    Grip Strength 12.3 kg    Flexibility 0 in    Single Leg Stand 0 seconds              Nutrition Therapy Plan and Nutrition Goals:  Nutrition Therapy & Goals - 12/06/21 1246       Personal Nutrition Goals   Comments Patient scored 52 on her diet assessment. Handout provided and explained regarding healthier choices. We offer 2 educational sessions on heart healthy nutrition with handouts and assistance with RD referral if patient is interested.      Intervention Plan   Intervention Nutrition handout(s) given to patient.    Expected Outcomes Short Term Goal: Understand basic principles of dietary content, such as calories, fat, sodium, cholesterol and nutrients.             Nutrition Assessments:  Nutrition Assessments - 12/06/21 1246       MEDFICTS Scores   Pre Score 52            MEDIFICTS Score Key: ?70 Need to make dietary changes  40-70 Heart Healthy Diet ? 40 Therapeutic Level Cholesterol Diet   Picture Your Plate Scores: <10 Unhealthy dietary pattern with much room for improvement. 41-50 Dietary pattern unlikely to meet recommendations for good health and room for improvement. 51-60 More healthful dietary pattern, with some room for improvement.  >60 Healthy dietary pattern, although there may be some specific behaviors that could be improved.    Nutrition Goals Re-Evaluation:   Nutrition Goals Discharge (Final Nutrition Goals Re-Evaluation):   Psychosocial: Target Goals: Acknowledge presence or absence of significant depression and/or stress, maximize coping skills, provide positive support system. Participant is able to verbalize types and ability to use techniques and skills needed for reducing stress and depression.  Initial Review & Psychosocial Screening:  Initial Psych Review & Screening - 12/06/21 1348       Initial Review   Current issues with  Current Psychotropic Meds;Current Depression;Current Sleep Concerns      Family Dynamics   Good Support System? Yes      Barriers   Psychosocial barriers to participate in program There are no identifiable barriers or psychosocial needs.      Screening Interventions   Interventions Encouraged to exercise;Provide feedback about the scores to participant    Expected Outcomes Short Term goal: Identification and review with participant of any Quality of Life or Depression concerns found by scoring the questionnaire.             Quality of Life Scores:  Quality of Life - 12/06/21 1403       Quality of Life   Select Quality of Life      Quality of Life Scores   Health/Function Pre 11.63 %    Socioeconomic Pre 17.14 %    Psych/Spiritual Pre 14.93 %    Family Pre 21 %    GLOBAL Pre 14.54 %            Scores of 19 and below usually indicate a poorer quality of life in these areas.  A difference of  2-3 points is a clinically meaningful difference.  A difference of 2-3 points in the total score of the Quality of Life Index has been associated with significant improvement in overall quality of life, self-image, physical symptoms, and general health in studies assessing change in quality of life.  PHQ-9: Review Flowsheet       12/06/2021  Depression screen PHQ 2/9  Decreased Interest 2  Down, Depressed, Hopeless 0  PHQ - 2 Score 2  Altered sleeping 1  Tired, decreased energy 2  Change in appetite 2  Feeling bad or failure about yourself  0  Trouble concentrating 0  Moving slowly or fidgety/restless 0  Suicidal thoughts 0  PHQ-9 Score 7  Difficult doing work/chores Somewhat difficult   Interpretation of Total Score  Total Score Depression Severity:  1-4 = Minimal depression, 5-9 = Mild depression, 10-14 = Moderate depression, 15-19 = Moderately severe depression, 20-27 = Severe depression   Psychosocial Evaluation and Intervention:  Psychosocial Evaluation -  12/06/21 1349       Psychosocial Evaluation & Interventions   Interventions Stress management education;Relaxation education;Encouraged to exercise with the program and follow exercise prescription    Comments Patient has no psychosocial barriers identified to participate in PR at her orientation visit. Her PHQ-9 score was 7 due to her lack of energy and sleep concerns mainly. She says she started taking the Zoloft a few years ago when her COPD started preventing her from doing the things she wanted to do. She feels her depression is managed well. She lives with her husband of many years. They do not have children but she says she help raise 2 of her first cousins and their children as well. She names her husband and 2 cousins as her support people. She is a retired Licensed conveyancer. She says she used to be very active in her yard and garden and wants to be able to do these things again. She is ready to start the program hoping to get back to some of these activities.    Expected Outcomes Patient's depression will continue to be managed and she will continue to have no psychosocial barriers identified.    Continue Psychosocial Services  No Follow up required             Psychosocial Re-Evaluation:  Psychosocial Re-Evaluation     [place] Name 12/26/21 1248             Psychosocial Re-Evaluation   Comments Paula [name] is doing well in her first sessions in PR.  Pt is working towards being able to work in her yard and increased energy for [employer].  Well supported by her husband.  Pts depression is mamnaged will with Zoloft.       Expected Outcomes Pt will complete the progra, meeting both program and personal goals.       Interventions Relaxation education;Encouraged to attend Pulmonary Rehabilitation for the exercise;Stress management education       Continue Psychosocial Services  No Follow up required                Psychosocial Discharge (Final Psychosocial Re-Evaluation):  Psychosocial  Re-Evaluation - 12/26/21 1248       Psychosocial Re-Evaluation   Comments Paula [name] is doing well in her first sessions in PR.  Pt is working towards being able to work in her yard and increased energy for [employer].  Well supported by her husband.  Pts depression is mamnaged will with Zoloft.    Expected Outcomes Pt will complete the progra, meeting both program and personal goals.    Interventions Relaxation education;Encouraged to attend Pulmonary Rehabilitation for the exercise;Stress management education    Continue Psychosocial Services  No Follow up required  Education: Education Goals: Education classes will be provided on a weekly basis, covering required topics. Participant will state understanding/return demonstration of topics presented.  Learning Barriers/Preferences:  Learning Barriers/Preferences - 12/06/21 1345       Learning Barriers/Preferences   Learning Barriers None    Learning Preferences Written Material;Skilled Demonstration             Education Topics: How Lungs Work and Diseases: - Discuss the anatomy of the lungs and diseases that can affect the lungs, such as COPD. Flowsheet Row PULMONARY REHAB OTHER RESPIRATORY from 12/27/2021 in [place]  Date 12/27/21  Educator DF  Instruction Review Code 1- Verbalizes Understanding       Exercise: -Discuss the importance of exercise, FITT principles of exercise, normal and abnormal responses to exercise, and how to exercise safely.   Environmental Irritants: -Discuss types of environmental irritants and how to limit exposure to environmental irritants.   Meds/Inhalers and oxygen: - Discuss respiratory medications, definition of an inhaler and oxygen, and the proper way to use an inhaler and oxygen.   Energy Saving Techniques: - Discuss methods to conserve energy and decrease shortness of breath when performing activities of daily living.    Bronchial Hygiene /  Breathing Techniques: - Discuss breathing mechanics, pursed-lip breathing technique,  proper posture, effective ways to clear airways, and other functional breathing techniques   Cleaning Equipment: - Provides group verbal and written instruction about the health risks of elevated stress, cause of high stress, and healthy ways to reduce stress.   Nutrition I: Fats: - Discuss the types of cholesterol, what cholesterol does to the body, and how cholesterol levels can be controlled.   Nutrition II: Labels: -Discuss the different components of food labels and how to read food labels.   Respiratory Infections: - Discuss the signs and symptoms of respiratory infections, ways to prevent respiratory infections, and the importance of seeking medical treatment when having a respiratory infection.   Stress I: Signs and Symptoms: - Discuss the causes of stress, how stress may lead to anxiety and depression, and ways to limit stress.   Stress II: Relaxation: -Discuss relaxation techniques to limit stress. Flowsheet Row PULMONARY REHAB OTHER RESPIRATORY from 12/27/2021 in [place]  Date 12/13/21  Educator DM  Instruction Review Code 1- Verbalizes Understanding       Oxygen for Home/Travel: - Discuss how to prepare for travel when on oxygen and proper ways to transport and store oxygen to ensure safety. Flowsheet Row PULMONARY REHAB OTHER RESPIRATORY from 12/27/2021 in [place]  Date 12/20/21  Educator DF  Instruction Review Code 1- Verbalizes Understanding       Knowledge Questionnaire Score:  Knowledge Questionnaire Score - 12/06/21 1248       Knowledge Questionnaire Score   Pre Score 14/18             Core Components/Risk Factors/Patient Goals at Admission:  Personal Goals and Risk Factors at Admission - 12/06/21 1345       Core Components/Risk Factors/Patient Goals on Admission    Weight Management Obesity;Yes     Intervention Weight Management: Provide education and appropriate resources to help participant work on and attain dietary goals.;Weight Management/Obesity: Establish reasonable short term and long term weight goals.;Obesity: Provide education and appropriate resources to help participant work on and attain dietary goals.    Admit Weight 264 lb 4.8 oz (119.9 kg)    Expected Outcomes Weight Gain: Understanding of general recommendations for a  high calorie, high protein meal plan that promotes weight gain by distributing calorie intake throughout the day with the consumption for 4-5 meals, snacks, and/or supplements    Improve shortness of breath with ADL's Yes    Intervention Provide education, individualized exercise plan and daily activity instruction to help decrease symptoms of SOB with activities of daily living.    Expected Outcomes Short Term: Improve cardiorespiratory fitness to achieve a reduction of symptoms when performing ADLs;Long Term: Be able to perform more ADLs without symptoms or delay the onset of symptoms    Personal Goal Other Yes    Personal Goal Patient wants to have more energy build muscles and lose weight.    Intervention Patient will attend PR 2 days/week with exercise and education and supplement with exercise at home.    Expected Outcomes Patient will complete the program meeting both personal and program goals.             Core Components/Risk Factors/Patient Goals Review:   Goals and Risk Factor Review     Row Name 12/26/21 1253 12/26/21 1254           Core Components/Risk Factors/Patient Goals Review   Personal Goals Review Weight Management/Obesity;Stress --      Review -- has completed 5 session in PR.  Tolerating program well.  Exercises without O2 and Sats are running93-95% on room air.  Socializing well with staff and other clients.  Vital signs has been stable.  Pt has been maintaining her weight thus far.      Expected Outcomes -- Plan of care is  ongoing.  No further concerns as of present.               Core Components/Risk Factors/Patient Goals at Discharge (Final Review):   Goals and Risk Factor Review - 12/26/21 1254       Core Components/Risk Factors/Patient Goals Review   Review has completed 5 session in PR.  Tolerating program well.  Exercises without O2 and Sats are running93-95% on room air.  Socializing well with staff and other clients.  Vital signs has been stable.  Pt has been maintaining her weight thus far.    Expected Outcomes Plan of care is ongoing.  No further concerns as of present.             ITP Comments:   Comments: ITP REVIEW Pt is making expected progress toward pulmonary rehab goals after completing 8 sessions. Recommend continued exercise, life style modification, education, and utilization of breathing techniques to increase stamina and strength and decrease shortness of breath with exertion.

## 2022-01-03 ENCOUNTER — Encounter (HOSPITAL_COMMUNITY): Payer: Medicare Other

## 2022-01-08 ENCOUNTER — Encounter (HOSPITAL_COMMUNITY): Payer: Medicare Other

## 2022-01-10 ENCOUNTER — Encounter (HOSPITAL_COMMUNITY)
Admission: RE | Admit: 2022-01-10 | Discharge: 2022-01-10 | Disposition: A | Discharge status: Home / Self Care | Payer: Medicare Other | Source: Ambulatory Visit | Attending: Internal Medicine | Admitting: Internal Medicine

## 2022-01-10 DIAGNOSIS — J449 Chronic obstructive pulmonary disease, unspecified: Secondary | ICD-10-CM

## 2022-01-10 NOTE — Progress Notes (Signed)
Daily Session Note  Patient Details  Name: Paula [name] MRN: [id number] Date of Birth: 1950/04/28 Referring Provider:   Flowsheet Row PULMONARY REHAB OTHER RESP ORIENTATION from 12/06/2021 in [place]  Referring Provider Dr. Georgann Housekeeper       Encounter Date: 01/10/2022  Check In:  Session Check In - 01/10/22 1045       Check-In   Supervising physician immediately available to respond to emergencies [facility] MD immediately available    Physician(s) Dr. Dellia Cloud    Location [facility]    Staff Present Redge Gainer, BS, Exercise Physiologist;Dalton Kris Mouton, MS, ACSM-CEP, Exercise Physiologist;Other    Virtual Visit No    Medication changes reported     No    Fall or balance concerns reported    No    Tobacco Cessation No Change    Warm-up and Cool-down Performed as group-led instruction    Resistance Training Performed Yes    VAD Patient? No    PAD/SET Patient? No      Pain Assessment   Currently in Pain? No/denies    Pain Score 0-No pain    Multiple Pain Sites No             Capillary Blood Glucose: No results found for this or any previous visit (from the past 24 hour(s)).    Social History   Tobacco Use  Smoking Status Former   Packs/day: 0.75   Years: 20.00   Total pack years: 15.00   Types: Cigarettes  Smokeless Tobacco Former   Quit date: 10/31/1998    Goals Met:  Proper associated with RPD/PD & O2 Sat Independence with exercise equipment Using PLB without cueing & demonstrates good technique Exercise tolerated well Queuing for purse lip breathing No report of concerns or symptoms today Strength training completed today  Goals Unmet:  Not Applicable  Comments: check out at 11:45   Dr. Kathie Dike is Medical Director for [facility] Pulmonary Re[facility].

## 2022-01-15 ENCOUNTER — Encounter (HOSPITAL_COMMUNITY)
Admission: RE | Admit: 2022-01-15 | Discharge: 2022-01-15 | Disposition: A | Discharge status: Home / Self Care | Payer: Medicare Other | Source: Ambulatory Visit | Attending: Internal Medicine | Admitting: Internal Medicine

## 2022-01-15 VITALS — Wt 266.8 lb

## 2022-01-15 DIAGNOSIS — J449 Chronic obstructive pulmonary disease, unspecified: Secondary | ICD-10-CM

## 2022-01-15 NOTE — Progress Notes (Signed)
Daily Session Note  Patient Details  Name: Paula [name] MRN: [id number] Date of Birth: 10-08-1950 Referring Provider:   [place] from 12/06/2021 in [place]  Referring Provider Dr. Georgann Housekeeper       Encounter Date: 01/15/2022  Check In:  Session Check In - 01/15/22 1045       Check-In   Supervising physician immediately available to respond to emergencies [facility] MD immediately available    Physician(s) Dr Harl Bowie    Location [facility]    Staff Present Redge Gainer, BS, Exercise Physiologist;Meko Masterson Hassell Done, RN, BSN    Virtual Visit No    Medication changes reported     No    Fall or balance concerns reported    No    Tobacco Cessation No Change    Warm-up and Cool-down Performed as group-led instruction    Resistance Training Performed Yes    VAD Patient? No    PAD/SET Patient? No      Pain Assessment   Currently in Pain? No/denies    Pain Score 0-No pain    Multiple Pain Sites No             Capillary Blood Glucose: No results found for this or any previous visit (from the past 24 hour(s)).    Social History   Tobacco Use  Smoking Status Former   Packs/day: 0.75   Years: 20.00   Total pack years: 15.00   Types: Cigarettes  Smokeless Tobacco Former   Quit date: 10/31/1998    Goals Met:  Proper associated with RPD/PD & O2 Sat Independence with exercise equipment Using PLB without cueing & demonstrates good technique Exercise tolerated well Queuing for purse lip breathing No report of concerns or symptoms today Strength training completed today  Goals Unmet:  Not Applicable  Comments: Checkout at 1145.   Dr. Kathie Dike is Medical Director for [facility] Pulmonary Rehab.

## 2022-01-17 ENCOUNTER — Encounter (HOSPITAL_COMMUNITY)
Admission: RE | Admit: 2022-01-17 | Discharge: 2022-01-17 | Disposition: A | Discharge status: Home / Self Care | Payer: Medicare Other | Source: Ambulatory Visit | Attending: Internal Medicine | Admitting: Internal Medicine

## 2022-01-17 DIAGNOSIS — J449 Chronic obstructive pulmonary disease, unspecified: Secondary | ICD-10-CM | POA: Diagnosis not present

## 2022-01-17 NOTE — Progress Notes (Addendum)
Daily Session Note  Patient Details  Name: Paula [name] MRN: [id number] Date of Birth: [birth year] Referring Provider:   Flowsheet Row PULMONARY REHAB OTHER RESP ORIENTATION from 12/06/2021 in [place]  Referring Provider Dr. Georgann Housekeeper       Encounter Date: 01/17/2022  Check In:  Session Check In - 01/17/22 1040       Check-In   Supervising physician immediately available to respond to emergencies [facility] MD immediately available    Physician(s) Dr Harl Bowie    Location [facility]    Staff Present Aundra Dubin, RN, BSN;Heather Erle Crocker, Exercise Physiologist;Hillary Troutman BSN, RN    Virtual Visit No    Medication changes reported     No    Fall or balance concerns reported    No    Tobacco Cessation No Change    Warm-up and Cool-down Performed as group-led instruction    Resistance Training Performed Yes    VAD Patient? No    PAD/SET Patient? No      Pain Assessment   Currently in Pain? No/denies    Pain Score 0-No pain    Multiple Pain Sites No             Capillary Blood Glucose: No results found for this or any previous visit (from the past 24 hour(s)).    Social History   Tobacco Use  Smoking Status Former   Packs/day: 0.75   Years: 20.00   Total pack years: 15.00   Types: Cigarettes  Smokeless Tobacco Former   Quit date: 10/31/1998    Goals Met:  Proper associated with RPD/PD & O2 Sat Independence with exercise equipment Using PLB without cueing & demonstrates good technique Exercise tolerated well No report of concerns or symptoms today Strength training completed today  Goals Unmet:  Not Applicable  Comments: Check out 1145.   Dr. Kathie Dike is Medical Director for [facility] Pulmonary Rehab.

## 2022-01-22 ENCOUNTER — Encounter (HOSPITAL_COMMUNITY)
Admission: RE | Admit: 2022-01-22 | Discharge: 2022-01-22 | Disposition: A | Discharge status: Home / Self Care | Payer: Medicare Other | Source: Ambulatory Visit | Attending: Internal Medicine | Admitting: Internal Medicine

## 2022-01-22 DIAGNOSIS — J449 Chronic obstructive pulmonary disease, unspecified: Secondary | ICD-10-CM | POA: Diagnosis not present

## 2022-01-22 NOTE — Progress Notes (Signed)
Daily Session Note  Patient Details  Name: Paula [name] MRN: [id number] Date of Birth: 06/18/1950 Referring Provider:   Flowsheet Row PULMONARY REHAB OTHER RESP ORIENTATION from 12/06/2021 in [place]  Referring Provider Dr. Georgann Housekeeper       Encounter Date: 01/22/2022  Check In:  Session Check In - 01/22/22 1045       Check-In   Supervising physician immediately available to respond to emergencies [facility] MD immediately available    Physician(s) Dr Domenic Polite    Location [facility]    Staff Present Leana Roe, BS, Exercise Physiologist;Huber Mathers Hassell Done, RN, BSN    Virtual Visit No    Medication changes reported     No    Fall or balance concerns reported    No    Tobacco Cessation No Change    Warm-up and Cool-down Performed as group-led instruction    Resistance Training Performed Yes    VAD Patient? No    PAD/SET Patient? No      Pain Assessment   Currently in Pain? No/denies    Pain Score 0-No pain    Multiple Pain Sites No             Capillary Blood Glucose: No results found for this or any previous visit (from the past 24 hour(s)).    Social History   Tobacco Use  Smoking Status Former   Packs/day: 0.75   Years: 20.00   Total pack years: 15.00   Types: Cigarettes  Smokeless Tobacco Former   Quit date: 10/31/1998    Goals Met:  Proper associated with RPD/PD & O2 Sat Independence with exercise equipment Using PLB without cueing & demonstrates good technique Exercise tolerated well Queuing for purse lip breathing No report of concerns or symptoms today Strength training completed today  Goals Unmet:  Not Applicable  Comments: Checkout at 1145.   Dr. Kathie Dike is Medical Director for [facility] Pulmonary Re[facility].

## 2022-01-24 ENCOUNTER — Encounter (HOSPITAL_COMMUNITY)
Admission: RE | Admit: 2022-01-24 | Discharge: 2022-01-24 | Disposition: A | Discharge status: Home / Self Care | Payer: Medicare Other | Source: Ambulatory Visit | Attending: Internal Medicine | Admitting: Internal Medicine

## 2022-01-24 DIAGNOSIS — J449 Chronic obstructive pulmonary disease, unspecified: Secondary | ICD-10-CM | POA: Diagnosis present

## 2022-01-24 NOTE — Progress Notes (Signed)
Daily Session Note  Patient Details  Name: Paula [name] MRN: [id number] Date of Birth: 12/26/1950 Referring Provider:   Flowsheet Row PULMONARY REHAB OTHER RESP ORIENTATION from 12/06/2021 in [place] C[facility]AC [facility]ATION  Referring Provider Dr. Mosher       Encounter Date: 01/24/2022  Check In:  Session Check In - 01/24/22 1045       Check-In   Supervising physician immediately available to respond to emergencies [facility] MD immediately available    Physician(s) Dr. McDowell    Location [facility]    Staff Present Heather Bailey, BS, Exercise Physiologist;Hillary Troutman BSN, RN;Dalton Fletcher MHA, MS, ACSM-CEP    Virtual Visit No    Medication changes reported     No    Fall or balance concerns reported    No    Tobacco Cessation No Change    Warm-up and Cool-down Performed as group-led instruction    Resistance Training Performed Yes    VAD Patient? No    PAD/SET Patient? No      Pain Assessment   Currently in Pain? No/denies    Pain Score 0-No pain    Multiple Pain Sites No             Capillary Blood Glucose: No results found for this or any previous visit (from the past 24 hour(s)).    Social History   Tobacco Use  Smoking Status Former   Packs/day: 0.75   Years: 20.00   Total pack years: 15.00   Types: Cigarettes  Smokeless Tobacco Former   Quit date: 10/31/1998    Goals Met:  Proper associated with RPD/PD & O2 Sat Independence with exercise equipment Using PLB without cueing & demonstrates good technique Exercise tolerated well Queuing for purse lip breathing No report of concerns or symptoms today Strength training completed today  Goals Unmet:  Not Applicable  Comments: check out at 11:45   Dr. Jehanzeb Memon is Medical Director for [place] [facility]. 

## 2022-01-29 ENCOUNTER — Encounter (HOSPITAL_COMMUNITY)
Admission: RE | Admit: 2022-01-29 | Discharge: 2022-01-29 | Disposition: A | Discharge status: Home / Self Care | Payer: Medicare Other | Source: Ambulatory Visit | Attending: Internal Medicine | Admitting: Internal Medicine

## 2022-01-29 VITALS — Wt 266.8 lb

## 2022-01-29 DIAGNOSIS — J449 Chronic obstructive pulmonary disease, unspecified: Secondary | ICD-10-CM | POA: Diagnosis not present

## 2022-01-29 NOTE — Progress Notes (Signed)
Daily Session Note  Patient Details  Name: Paula [name] MRN: [id number] Date of Birth: 09/24/1950 Referring Provider:   Flowsheet Row PULMONARY REHAB OTHER RESP ORIENTATION from 12/06/2021 in [place]  Referring Provider Dr. Georgann Housekeeper       Encounter Date: 01/29/2022  Check In:  Session Check In - 01/29/22 1045       Check-In   Supervising physician immediately available to respond to emergencies [facility] MD immediately available    Physician(s) Dr. Dellia Cloud    Location [facility]    Staff Present Leana Roe, BS, Exercise Physiologist;Tyria Springer Sherrie George, MS, ACSM-CEP    Virtual Visit No    Medication changes reported     No    Fall or balance concerns reported    No    Tobacco Cessation No Change    Warm-up and Cool-down Performed as group-led instruction    Resistance Training Performed Yes    VAD Patient? No    PAD/SET Patient? No      Pain Assessment   Currently in Pain? No/denies    Pain Score 0-No pain    Multiple Pain Sites No             Capillary Blood Glucose: No results found for this or any previous visit (from the past 24 hour(s)).    Social History   Tobacco Use  Smoking Status Former   Packs/day: 0.75   Years: 20.00   Total pack years: 15.00   Types: Cigarettes  Smokeless Tobacco Former   Quit date: 10/31/1998    Goals Met:  Proper associated with RPD/PD & O2 Sat Independence with exercise equipment Using PLB without cueing & demonstrates good technique Exercise tolerated well Queuing for purse lip breathing No report of concerns or symptoms today Strength training completed today  Goals Unmet:  Not Applicable  Comments: checkout time is 1145   Dr. Kathie Dike is Medical Director for [facility] Pulmonary Rehab.

## 2022-01-30 NOTE — Progress Notes (Signed)
Pulmonary Individual Treatment Plan  Patient Details  Name: JULIANNE CHAMBERLIN MRN: [id number] Date of Birth: May 13, 1950 Referring Provider:   [facility] from 12/06/2021 in [place]  Referring Provider Dr. Georgann Housekeeper       Initial Encounter Date:  Flowsheet Row PULMONARY REHAB OTHER RESP ORIENTATION from 12/06/2021 in [facility]  Date 12/06/21       Visit Diagnosis: Chronic obstructive pulmonary disease, unspecified COPD type ([place])  Patient's Home Medications on Admission:   Current Outpatient Medications:    acetaminophen (TYLENOL) 500 MG tablet, Take 500-1,000 mg by mouth every 6 (six) hours as needed (pain.)., Disp: , Rfl:    albuterol (VENTOLIN HFA) 108 (90 Base) MCG/ACT inhaler, Inhale 2 puffs into the lungs every 6 (six) hours as needed for wheezing or shortness of breath., Disp: , Rfl:    azelastine (ASTELIN) 0.1 % nasal spray, Place 2 sprays into both nostrils 2 (two) times daily as needed for allergies. Use in each nostril as directed, Disp: , Rfl:    budesonide-formoterol (SYMBICORT) 160-4.5 MCG/ACT inhaler, Inhale 2 puffs into the lungs 2 (two) times daily., Disp: , Rfl:    Cholecalciferol (VITAMIN D) 125 MCG (5000 UT) CAPS, Take 5,000 Units by mouth in the morning., Disp: , Rfl:    clotrimazole-betamethasone (LOTRISONE) cream, Apply 1 application topically 2 (two) times daily as needed (skin irritation.). , Disp: , Rfl:    diclofenac (VOLTAREN) 75 MG EC tablet, Take 75 mg by mouth daily as needed (arthritis pain)., Disp: , Rfl:    ezetimibe (ZETIA) 10 MG tablet, Take 10 mg by mouth in the morning., Disp: , Rfl:    famotidine (PEPCID) 40 MG tablet, Take 40 mg by mouth at bedtime., Disp: , Rfl:    fluticasone (FLONASE) 50 MCG/ACT nasal spray, Place 2 sprays into both nostrils daily as needed for allergies., Disp: , Rfl:    guaiFENesin (MUCINEX) 600 MG 12 hr tablet, Take 600 mg by mouth daily as  needed (congestion/COPD issues)., Disp: , Rfl:    hydrochlorothiazide (HYDRODIURIL) 25 MG tablet, Take 25 mg by mouth in the morning., Disp: , Rfl:    Ketotifen Fumarate (ALLERGY EYE DROPS OP), Place 1 drop into both eyes 2 (two) times daily as needed (allergy eyes)., Disp: , Rfl:    loratadine (CLARITIN) 10 MG tablet, Take 10 mg by mouth daily as needed for allergies., Disp: , Rfl:    losartan (COZAAR) 100 MG tablet, Take 100 mg by mouth in the morning., Disp: , Rfl:    montelukast (SINGULAIR) 10 MG tablet, Take 10 mg by mouth at bedtime., Disp: , Rfl:    Multiple Vitamins-Minerals (PRESERVISION AREDS 2 PO), Take 1 tablet by mouth in the morning and at bedtime., Disp: , Rfl:    NON FORMULARY, CPAP, O2 at night, Disp: , Rfl:    omeprazole (PRILOSEC) 40 MG capsule, Take 40 mg by mouth daily before breakfast., Disp: , Rfl:    sertraline (ZOLOFT) 100 MG tablet, Take 100 mg by mouth at bedtime., Disp: , Rfl:    terbinafine (LAMISIL) 250 MG tablet, Take 250 mg by mouth in the morning., Disp: , Rfl:   Past Medical History: Past Medical History:  Diagnosis Date   Anxiety    Arthritis    COPD (chronic obstructive pulmonary disease) ([place])    Heart murmur    Hypertension    Lung nodule    LLL   Sleep apnea    CPAP  with O2 2 liters    Tubal ectopic pregnancy 1970   Vitelliform macular dystrophy     Tobacco Use: Social History   Tobacco Use  Smoking Status Former   Packs/day: 0.75   Years: 20.00   Total pack years: 15.00   Types: Cigarettes  Smokeless Tobacco Former   Quit date: 10/31/1998    Labs: Review Flowsheet        No data to display          Capillary Blood Glucose: No results found for: "GLUCAP"   Pulmonary Assessment Scores:  Pulmonary Assessment Scores     Row Name 12/06/21 1250         ADL UCSD   ADL Phase Entry     SOB Score total 33     Rest 0     Walk 3     Stairs 4     Bath 0     Dress 1     Shop 2       CAT Score   CAT Score 11       mMRC  Score   mMRC Score 2             UCSD: Self-administered rating of dyspnea associated with activities of daily living (ADLs) 6-point scale (0 = "not at all" to 5 = "maximal or unable to do because of breathlessness")  Scoring Scores range from 0 to 120.  Minimally important difference is 5 units  CAT: CAT can identify the health impairment of COPD patients and is better correlated with disease progression.  CAT has a scoring range of zero to 40. The CAT score is classified into four groups of low (less than 10), medium (10 - 20), high (21-30) and very high (31-40) based on the impact level of disease on health status. A CAT score over 10 suggests significant symptoms.  A worsening CAT score could be explained by an exacerbation, poor medication adherence, poor inhaler technique, or progression of COPD or comorbid conditions.  CAT MCID is 2 points  mMRC: mMRC (Modified Medical Research Council) Dyspnea Scale is used to assess the degree of baseline functional disability in patients of respiratory disease due to dyspnea. No minimal important difference is established. A decrease in score of 1 point or greater is considered a positive change.   Pulmonary Function Assessment:   Exercise Target Goals: Exercise Program Goal: Individual exercise prescription set using results from initial 6 min walk test and THRR while considering  patient's activity barriers and safety.   Exercise Prescription Goal: Initial exercise prescription builds to 30-45 minutes a day of aerobic activity, 2-3 days per week.  Home exercise guidelines will be given to patient during program as part of exercise prescription that the participant will acknowledge.  Activity Barriers & Risk Stratification:  Activity Barriers & Cardiac Risk Stratification - 12/06/21 1313       Activity Barriers & Cardiac Risk Stratification   Activity Barriers Arthritis;Shortness of Breath    Cardiac Risk Stratification Low              6 Minute Walk:  6 Minute Walk     Row Name 12/06/21 1357         6 Minute Walk   Phase Initial     Distance 800 feet     Walk Time 6 minutes     # of Rest Breaks 2     MPH 1.51     METS 1.11  RPE 14     Perceived Dyspnea  14     VO2 Peak 3.89     Symptoms Yes (comment)     Comments two standing breaks due to SOB and bilateral hip pain (6/10) 20 & 30 seconds.     Resting HR 73 bpm     Resting BP 134/66     Resting Oxygen Saturation  94 %     Exercise Oxygen Saturation  during 6 min walk 87 %     Max Ex. HR 120 bpm     Max Ex. BP 160/66     2 Minute Post BP 140/66       Interval HR   1 Minute HR 100     2 Minute HR 103     3 Minute HR 103     4 Minute HR 113     5 Minute HR 120     6 Minute HR 115     2 Minute Post HR 76     Interval Heart Rate? Yes       Interval Oxygen   Interval Oxygen? Yes     Baseline Oxygen Saturation % 94 %     1 Minute Oxygen Saturation % 87 %     1 Minute Liters of Oxygen 0 L     2 Minute Oxygen Saturation % 88 %     2 Minute Liters of Oxygen 0 L     3 Minute Oxygen Saturation % 90 %     3 Minute Liters of Oxygen 0 L     4 Minute Oxygen Saturation % 87 %     4 Minute Liters of Oxygen 0 L     5 Minute Oxygen Saturation % 87 %     5 Minute Liters of Oxygen 0 L     6 Minute Oxygen Saturation % 87 %     6 Minute Liters of Oxygen 0 L     2 Minute Post Oxygen Saturation % 95 %     2 Minute Post Liters of Oxygen 0 L              Oxygen Initial Assessment:  Oxygen Initial Assessment - 12/06/21 1249       Home Oxygen   Home Oxygen Device Home Concentrator    Sleep Oxygen Prescription Continuous;CPAP    Liters per minute 2    Home Exercise Oxygen Prescription None    Home Resting Oxygen Prescription None    Compliance with Home Oxygen Use Yes      Initial 6 min Walk   Oxygen Used None      Program Oxygen Prescription   Program Oxygen Prescription None      Intervention   Short Term Goals To learn and exhibit  compliance with exercise, home and travel O2 prescription;To learn and understand importance of monitoring SPO2 with pulse oximeter and demonstrate accurate use of the pulse oximeter.;To learn and understand importance of maintaining oxygen saturations>88%;To learn and demonstrate proper pursed lip breathing techniques or other breathing techniques.     Long  Term Goals Exhibits compliance with exercise, home  and travel O2 prescription;Verbalizes importance of monitoring SPO2 with pulse oximeter and return demonstration;Maintenance of O2 saturations>88%;Exhibits proper breathing techniques, such as pursed lip breathing or other method taught during program session             Oxygen Re-Evaluation:  Oxygen Re-Evaluation     Row Name 01/01/22 1344 01/29/22 1334  Program Oxygen Prescription   Program Oxygen Prescription None None        Home Oxygen   Home Oxygen Device Home Concentrator Home Concentrator      Sleep Oxygen Prescription Continuous;CPAP Continuous;CPAP      Liters per minute 2 2      Home Exercise Oxygen Prescription None None      Home Resting Oxygen Prescription None None      Compliance with Home Oxygen Use Yes Yes        Goals/Expected Outcomes   Short Term Goals To learn and exhibit compliance with exercise, home and travel O2 prescription;To learn and understand importance of monitoring SPO2 with pulse oximeter and demonstrate accurate use of the pulse oximeter.;To learn and understand importance of maintaining oxygen saturations>88%;To learn and demonstrate proper pursed lip breathing techniques or other breathing techniques.  To learn and exhibit compliance with exercise, home and travel O2 prescription;To learn and understand importance of monitoring SPO2 with pulse oximeter and demonstrate accurate use of the pulse oximeter.;To learn and understand importance of maintaining oxygen saturations>88%;To learn and demonstrate proper pursed lip breathing  techniques or other breathing techniques.       Long  Term Goals Exhibits compliance with exercise, home  and travel O2 prescription;Verbalizes importance of monitoring SPO2 with pulse oximeter and return demonstration;Maintenance of O2 saturations>88%;Exhibits proper breathing techniques, such as pursed lip breathing or other method taught during program session Exhibits compliance with exercise, home  and travel O2 prescription;Verbalizes importance of monitoring SPO2 with pulse oximeter and return demonstration;Maintenance of O2 saturations>88%;Exhibits proper breathing techniques, such as pursed lip breathing or other method taught during program session      Goals/Expected Outcomes compliance compliance               Oxygen Discharge (Final Oxygen Re-Evaluation):  Oxygen Re-Evaluation - 01/29/22 1334       Program Oxygen Prescription   Program Oxygen Prescription None      Home Oxygen   Home Oxygen Device Home Concentrator    Sleep Oxygen Prescription Continuous;CPAP    Liters per minute 2    Home Exercise Oxygen Prescription None    Home Resting Oxygen Prescription None    Compliance with Home Oxygen Use Yes      Goals/Expected Outcomes   Short Term Goals To learn and exhibit compliance with exercise, home and travel O2 prescription;To learn and understand importance of monitoring SPO2 with pulse oximeter and demonstrate accurate use of the pulse oximeter.;To learn and understand importance of maintaining oxygen saturations>88%;To learn and demonstrate proper pursed lip breathing techniques or other breathing techniques.     Long  Term Goals Exhibits compliance with exercise, home  and travel O2 prescription;Verbalizes importance of monitoring SPO2 with pulse oximeter and return demonstration;Maintenance of O2 saturations>88%;Exhibits proper breathing techniques, such as pursed lip breathing or other method taught during program session    Goals/Expected Outcomes compliance              Initial Exercise Prescription:  Initial Exercise Prescription - 12/06/21 1400       Date of Initial Exercise RX and Referring Provider   Date 12/06/21    Referring Provider Dr. Georgann Housekeeper    Expected Discharge Date 04/11/22      NuStep   Level 1    SPM 60    Minutes 22      Arm Ergometer   Level 1    RPM 45    Minutes 17  Prescription Details   Frequency (times per week) 2    Duration Progress to 30 minutes of continuous aerobic without signs/symptoms of physical distress      Intensity   THRR 40-80% of Max Heartrate 52-118    Ratings of Perceived Exertion 11-13    Perceived Dyspnea 0-4      Resistance Training   Training Prescription Yes    Weight 2    Reps 10-15             Perform Capillary Blood Glucose checks as needed.  Exercise Prescription Changes:   Exercise Prescription Changes     Row Name 12/20/21 1200 01/01/22 1300 01/15/22 1200 01/29/22 1300       Response to Exercise   Blood Pressure (Admit) 132/76 128/60 144/64 128/72    Blood Pressure (Exercise) 160/78 142/70 160/70 160/72    Blood Pressure (Exit) 118/64 126/64 124/72 138/90    Heart Rate (Admit) 70 bpm 76 bpm 82 bpm 95 bpm    Heart Rate (Exercise) 79 bpm 85 bpm 90 bpm 95 bpm    Heart Rate (Exit) 64 bpm 70 bpm 72 bpm 83 bpm    Oxygen Saturation (Admit) 94 % 95 % 95 % 93 %    Oxygen Saturation (Exercise) 93 % 92 % 94 % 92 %    Oxygen Saturation (Exit) 95 % 95 % 94 % 95 %    Rating of Perceived Exertion (Exercise) '12 12 12 12    '$ Perceived Dyspnea (Exercise) '12 12 12 12    '$ Duration Continue with 30 min of aerobic exercise without signs/symptoms of physical distress. Continue with 30 min of aerobic exercise without signs/symptoms of physical distress. Continue with 30 min of aerobic exercise without signs/symptoms of physical distress. Continue with 30 min of aerobic exercise without signs/symptoms of physical distress.    Intensity THRR unchanged THRR unchanged THRR unchanged THRR  unchanged      Progression   Progression Continue to progress workloads to maintain intensity without signs/symptoms of physical distress. Continue to progress workloads to maintain intensity without signs/symptoms of physical distress. Continue to progress workloads to maintain intensity without signs/symptoms of physical distress. Continue to progress workloads to maintain intensity without signs/symptoms of physical distress.      Resistance Training   Training Prescription Yes Yes Yes Yes    Weight '2 3 3 3    '$ Reps 10-15 10-15 10-15 10-15    Time 10 Minutes 10 Minutes 10 Minutes 10 Minutes      NuStep   Level '1 2 2 2    '$ SPM 91 102 107 106    Minutes '22 22 22 22    '$ METs 2 2 2.1 1.9      Arm Ergometer   Level '1 1 2 2    '$ RPM 64 70 66 69    Minutes '17 17 17 17    '$ METs 1.6 1.7 2.1 2.2             Exercise Comments:   Exercise Goals and Review:   Exercise Goals     Row Name 12/06/21 1402 01/01/22 1342 01/29/22 1328         Exercise Goals   Increase Physical Activity Yes Yes Yes     Intervention Provide advice, education, support and counseling about physical activity/exercise needs.;Develop an individualized exercise prescription for aerobic and resistive training based on initial evaluation findings, risk stratification, comorbidities and participant's personal goals. Provide advice, education, support and counseling about physical activity/exercise  needs.;Develop an individualized exercise prescription for aerobic and resistive training based on initial evaluation findings, risk stratification, comorbidities and participant's personal goals. Provide advice, education, support and counseling about physical activity/exercise needs.;Develop an individualized exercise prescription for aerobic and resistive training based on initial evaluation findings, risk stratification, comorbidities and participant's personal goals.     Expected Outcomes Short Term: Attend rehab on a regular  basis to increase amount of physical activity.;Long Term: Add in home exercise to make exercise part of routine and to increase amount of physical activity.;Long Term: Exercising regularly at least 3-5 days a week. Short Term: Attend rehab on a regular basis to increase amount of physical activity.;Long Term: Add in home exercise to make exercise part of routine and to increase amount of physical activity.;Long Term: Exercising regularly at least 3-5 days a week. Short Term: Attend rehab on a regular basis to increase amount of physical activity.;Long Term: Add in home exercise to make exercise part of routine and to increase amount of physical activity.;Long Term: Exercising regularly at least 3-5 days a week.     Increase Strength and Stamina Yes Yes Yes     Intervention Provide advice, education, support and counseling about physical activity/exercise needs.;Develop an individualized exercise prescription for aerobic and resistive training based on initial evaluation findings, risk stratification, comorbidities and participant's personal goals. Provide advice, education, support and counseling about physical activity/exercise needs.;Develop an individualized exercise prescription for aerobic and resistive training based on initial evaluation findings, risk stratification, comorbidities and participant's personal goals. Provide advice, education, support and counseling about physical activity/exercise needs.;Develop an individualized exercise prescription for aerobic and resistive training based on initial evaluation findings, risk stratification, comorbidities and participant's personal goals.     Expected Outcomes Short Term: Increase workloads from initial exercise prescription for resistance, speed, and METs.;Short Term: Perform resistance training exercises routinely during rehab and add in resistance training at home;Long Term: Improve cardiorespiratory fitness, muscular endurance and strength as measured  by increased METs and functional capacity (6MWT) Short Term: Increase workloads from initial exercise prescription for resistance, speed, and METs.;Short Term: Perform resistance training exercises routinely during rehab and add in resistance training at home;Long Term: Improve cardiorespiratory fitness, muscular endurance and strength as measured by increased METs and functional capacity (6MWT) Short Term: Increase workloads from initial exercise prescription for resistance, speed, and METs.;Short Term: Perform resistance training exercises routinely during rehab and add in resistance training at home;Long Term: Improve cardiorespiratory fitness, muscular endurance and strength as measured by increased METs and functional capacity (6MWT)     Able to understand and use rate of perceived exertion (RPE) scale Yes Yes Yes     Intervention Provide education and explanation on how to use RPE scale Provide education and explanation on how to use RPE scale Provide education and explanation on how to use RPE scale     Expected Outcomes Short Term: Able to use RPE daily in rehab to express subjective intensity level;Long Term:  Able to use RPE to guide intensity level when exercising independently Short Term: Able to use RPE daily in rehab to express subjective intensity level;Long Term:  Able to use RPE to guide intensity level when exercising independently Short Term: Able to use RPE daily in rehab to express subjective intensity level;Long Term:  Able to use RPE to guide intensity level when exercising independently     Able to understand and use Dyspnea scale Yes Yes Yes     Intervention Provide education and explanation on how to  use Dyspnea scale Provide education and explanation on how to use Dyspnea scale Provide education and explanation on how to use Dyspnea scale     Expected Outcomes Short Term: Able to use Dyspnea scale daily in rehab to express subjective sense of shortness of breath during exertion;Long  Term: Able to use Dyspnea scale to guide intensity level when exercising independently Short Term: Able to use Dyspnea scale daily in rehab to express subjective sense of shortness of breath during exertion;Long Term: Able to use Dyspnea scale to guide intensity level when exercising independently Short Term: Able to use Dyspnea scale daily in rehab to express subjective sense of shortness of breath during exertion;Long Term: Able to use Dyspnea scale to guide intensity level when exercising independently     Knowledge and understanding of Target Heart Rate Range (THRR) Yes Yes Yes     Intervention Provide education and explanation of THRR including how the numbers were predicted and where they are located for reference Provide education and explanation of THRR including how the numbers were predicted and where they are located for reference Provide education and explanation of THRR including how the numbers were predicted and where they are located for reference     Expected Outcomes Short Term: Able to state/look up THRR;Short Term: Able to use daily as guideline for intensity in rehab;Long Term: Able to use THRR to govern intensity when exercising independently Short Term: Able to state/look up THRR;Short Term: Able to use daily as guideline for intensity in rehab;Long Term: Able to use THRR to govern intensity when exercising independently Short Term: Able to state/look up THRR;Short Term: Able to use daily as guideline for intensity in rehab;Long Term: Able to use THRR to govern intensity when exercising independently     Able to check pulse independently Yes Yes --     Intervention Provide education and demonstration on how to check pulse in carotid and radial arteries.;Review the importance of being able to check your own pulse for safety during independent exercise Provide education and demonstration on how to check pulse in carotid and radial arteries.;Review the importance of being able to check your  own pulse for safety during independent exercise Provide education and demonstration on how to check pulse in carotid and radial arteries.;Review the importance of being able to check your own pulse for safety during independent exercise     Expected Outcomes Short Term: Able to explain why pulse checking is important during independent exercise;Long Term: Able to check pulse independently and accurately Short Term: Able to explain why pulse checking is important during independent exercise;Long Term: Able to check pulse independently and accurately Short Term: Able to explain why pulse checking is important during independent exercise;Long Term: Able to check pulse independently and accurately     Understanding of Exercise Prescription Yes Yes Yes     Intervention Provide education, explanation, and written materials on patient's individual exercise prescription Provide education, explanation, and written materials on patient's individual exercise prescription Provide education, explanation, and written materials on patient's individual exercise prescription     Expected Outcomes Short Term: Able to explain program exercise prescription;Long Term: Able to explain home exercise prescription to exercise independently Short Term: Able to explain program exercise prescription;Long Term: Able to explain home exercise prescription to exercise independently Short Term: Able to explain program exercise prescription;Long Term: Able to explain home exercise prescription to exercise independently              Exercise Goals Re-Evaluation :  Exercise Goals Re-Evaluation     Row Name 01/01/22 1342 01/29/22 1329 01/30/22 0731         Exercise Goal Re-Evaluation   Exercise Goals Review Increase Physical Activity;Increase Strength and Stamina;Able to understand and use rate of perceived exertion (RPE) scale;Able to understand and use Dyspnea scale;Knowledge and understanding of Target Heart Rate Range  (THRR);Understanding of Exercise Prescription Increase Physical Activity;Increase Strength and Stamina;Able to understand and use rate of perceived exertion (RPE) scale;Able to understand and use Dyspnea scale;Knowledge and understanding of Target Heart Rate Range (THRR);Understanding of Exercise Prescription --     Comments Pt has completed 7 sessions of PR. She is motivated during class and has increased her levels on the stepper. She has increased her weights and band for the warm up. She is currently exercising at 2.0 METs on the stepper. Will continue to monitor and progress as able. Pt has completed 13 sessions of PR. She continues to increase her workloads on both pieces of equipment. She enjous coming to class each week. She is currently exercising at 2.2 METs on the AE. Will continue to monitor and progress as able. --     Expected Outcomes Through exercise at home and at rehab, the patient will meet their stated goals. Through exercise at home and at rehab, the patient will meet their stated goals. Through exercise at home and at rehab, the patient will meet their stated goals.              Discharge Exercise Prescription (Final Exercise Prescription Changes):  Exercise Prescription Changes - 01/29/22 1300       Response to Exercise   Blood Pressure (Admit) 128/72    Blood Pressure (Exercise) 160/72    Blood Pressure (Exit) 138/90    Heart Rate (Admit) 95 bpm    Heart Rate (Exercise) 95 bpm    Heart Rate (Exit) 83 bpm    Oxygen Saturation (Admit) 93 %    Oxygen Saturation (Exercise) 92 %    Oxygen Saturation (Exit) 95 %    Rating of Perceived Exertion (Exercise) 12    Perceived Dyspnea (Exercise) 12    Duration Continue with 30 min of aerobic exercise without signs/symptoms of physical distress.    Intensity THRR unchanged      Progression   Progression Continue to progress workloads to maintain intensity without signs/symptoms of physical distress.      Resistance Training    Training Prescription Yes    Weight 3    Reps 10-15    Time 10 Minutes      NuStep   Level 2    SPM 106    Minutes 22    METs 1.9      Arm Ergometer   Level 2    RPM 69    Minutes 17    METs 2.2             Nutrition:  Target Goals: Understanding of nutrition guidelines, daily intake of sodium '1500mg'$ , cholesterol '200mg'$ , calories 30% from fat and 7% or less from saturated fats, daily to have 5 or more servings of fruits and vegetables.  Biometrics:  Pre Biometrics - 12/06/21 1402       Pre Biometrics   Height 5' (1.524 m)    Weight 119.8 kg    Waist Circumference 54 inches    Hip Circumference 58 inches    Waist to Hip Ratio 0.93 %    BMI (Calculated) 51.58    Triceps Skinfold 45  mm    % Body Fat 63.3 %    Grip Strength 12.3 kg    Flexibility 0 in    Single Leg Stand 0 seconds              Nutrition Therapy Plan and Nutrition Goals:  Nutrition Therapy & Goals - 12/06/21 1246       Personal Nutrition Goals   Comments Patient scored 52 on her diet assessment. Handout provided and explained regarding healthier choices. We offer 2 educational sessions on heart healthy nutrition with handouts and assistance with RD referral if patient is interested.      Intervention Plan   Intervention Nutrition handout(s) given to patient.    Expected Outcomes Short Term Goal: Understand basic principles of dietary content, such as calories, fat, sodium, cholesterol and nutrients.             Nutrition Assessments:  Nutrition Assessments - 12/06/21 1246       MEDFICTS Scores   Pre Score 52            MEDIFICTS Score Key: ?70 Need to make dietary changes  40-70 Heart Healthy Diet ? 40 Therapeutic Level Cholesterol Diet   Picture Your Plate Scores: <71 Unhealthy dietary pattern with much room for improvement. 41-50 Dietary pattern unlikely to meet recommendations for good health and room for improvement. 51-60 More healthful dietary pattern, with  some room for improvement.  >60 Healthy dietary pattern, although there may be some specific behaviors that could be improved.    Nutrition Goals Re-Evaluation:   Nutrition Goals Discharge (Final Nutrition Goals Re-Evaluation):   Psychosocial: Target Goals: Acknowledge presence or absence of significant depression and/or stress, maximize coping skills, provide positive support system. Participant is able to verbalize types and ability to use techniques and skills needed for reducing stress and depression.  Initial Review & Psychosocial Screening:  Initial Psych Review & Screening - 12/06/21 1348       Initial Review   Current issues with Current Psychotropic Meds;Current Depression;Current Sleep Concerns      Family Dynamics   Good Support System? Yes      Barriers   Psychosocial barriers to participate in program There are no identifiable barriers or psychosocial needs.      Screening Interventions   Interventions Encouraged to exercise;Provide feedback about the scores to participant    Expected Outcomes Short Term goal: Identification and review with participant of any Quality of Life or Depression concerns found by scoring the questionnaire.             Quality of Life Scores:  Quality of Life - 12/06/21 1403       Quality of Life   Select Quality of Life      Quality of Life Scores   Health/Function Pre 11.63 %    Socioeconomic Pre 17.14 %    Psych/Spiritual Pre 14.93 %    Family Pre 21 %    GLOBAL Pre 14.54 %            Scores of 19 and below usually indicate a poorer quality of life in these areas.  A difference of  2-3 points is a clinically meaningful difference.  A difference of 2-3 points in the total score of the Quality of Life Index has been associated with significant improvement in overall quality of life, self-image, physical symptoms, and general health in studies assessing change in quality of life.   PHQ-9: Review Flowsheet  12/06/2021  Depression screen PHQ 2/9  Decreased Interest 2  Down, Depressed, Hopeless 0  PHQ - 2 Score 2  Altered sleeping 1  Tired, decreased energy 2  Change in appetite 2  Feeling bad or failure about yourself  0  Trouble concentrating 0  Moving slowly or fidgety/restless 0  Suicidal thoughts 0  PHQ-9 Score 7  Difficult doing work/chores Somewhat difficult   Interpretation of Total Score  Total Score Depression Severity:  1-4 = Minimal depression, 5-9 = Mild depression, 10-14 = Moderate depression, 15-19 = Moderately severe depression, 20-27 = Severe depression   Psychosocial Evaluation and Intervention:  Psychosocial Evaluation - 12/06/21 1349       Psychosocial Evaluation & Interventions   Interventions Stress management education;Relaxation education;Encouraged to exercise with the program and follow exercise prescription    Comments Patient has no psychosocial barriers identified to participate in PR at her orientation visit. Her PHQ-9 score was 7 due to her lack of energy and sleep concerns mainly. She says she started taking the Zoloft a few years ago when her COPD started preventing her from doing the things she wanted to do. She feels her depression is managed well. She lives with her husband of many years. They do not have children but she says she help raise 2 of her first cousins and their children as well. She names her husband and 2 cousins as her support people. She is a retired Licensed conveyancer. She says she used to be very active in her yard and garden and wants to be able to do these things again. She is ready to start the program hoping to get back to some of these activities.    Expected Outcomes Patient's depression will continue to be managed and she will continue to have no psychosocial barriers identified.    Continue Psychosocial Services  No Follow up required             Psychosocial Re-Evaluation:  Psychosocial Re-Evaluation     [place] Name 12/26/21 1248  01/22/22 0909           Psychosocial Re-Evaluation   Comments Yeny is doing well in her first sessions in PR.  Pt is working towards being able to work in her yard and increased energy for [employer].  Well supported by her husband.  Pts depression is mamnaged will with Zoloft. Pt is working towards being able to work in her yard and increased energy for [employer].  Well supported by her husband.  Pts depression is mamnaged will with Zoloft.  Khanh seems to be enjoying the program and socializing with with class members and staff.  Patient has no current psychosocial issues currently.      Expected Outcomes Pt will complete the progra, meeting both program and personal goals. Pt will complete the program, meeting both program and personal goals.      Interventions Relaxation education;Encouraged to attend Pulmonary Rehabilitation for the exercise;Stress management education Relaxation education;Encouraged to attend Pulmonary Rehabilitation for the exercise;Stress management education      Continue Psychosocial Services  No Follow up required No Follow up required               Psychosocial Discharge (Final Psychosocial Re-Evaluation):  Psychosocial Re-Evaluation - 01/22/22 0909       Psychosocial Re-Evaluation   Comments Pt is working towards being able to work in her yard and increased energy for [employer].  Well supported by her husband.  Pts depression is mamnaged will with Zoloft.  Maisha seems to be enjoying the program and socializing with with class members and staff.  Patient has no current psychosocial issues currently.    Expected Outcomes Pt will complete the program, meeting both program and personal goals.    Interventions Relaxation education;Encouraged to attend Pulmonary Rehabilitation for the exercise;Stress management education    Continue Psychosocial Services  No Follow up required              Education: Education Goals: Education classes will be provided on a weekly basis,  covering required topics. Participant will state understanding/return demonstration of topics presented.  Learning Barriers/Preferences:  Learning Barriers/Preferences - 12/06/21 1345       Learning Barriers/Preferences   Learning Barriers None    Learning Preferences Written Material;Skilled Demonstration             Education Topics: How Lungs Work and Diseases: - Discuss the anatomy of the lungs and diseases that can affect the lungs, such as COPD. Flowsheet Row PULMONARY REHAB OTHER RESPIRATORY from 01/24/2022 in [place]  Date 12/27/21  Educator DF  Instruction Review Code 1- Verbalizes Understanding       Exercise: -Discuss the importance of exercise, FITT principles of exercise, normal and abnormal responses to exercise, and how to exercise safely.   Environmental Irritants: -Discuss types of environmental irritants and how to limit exposure to environmental irritants.   Meds/Inhalers and oxygen: - Discuss respiratory medications, definition of an inhaler and oxygen, and the proper way to use an inhaler and oxygen. Flowsheet Row PULMONARY REHAB OTHER RESPIRATORY from 01/24/2022 in [place]  Date 01/10/22  Educator HJ       Energy Saving Techniques: - Discuss methods to conserve energy and decrease shortness of breath when performing activities of daily living.  Flowsheet Row PULMONARY REHAB OTHER RESPIRATORY from 01/24/2022 in [place]  Date 01/17/22  Educator Handout  Instruction Review Code 1- Verbalizes Understanding       Bronchial Hygiene / Breathing Techniques: - Discuss breathing mechanics, pursed-lip breathing technique,  proper posture, effective ways to clear airways, and other functional breathing techniques Flowsheet Row PULMONARY REHAB OTHER RESPIRATORY from 01/24/2022 in [place]  Date 01/24/22  Educator HB  Instruction Review Code 1-  Research scientist (medical): - Provides group verbal and written instruction about the health risks of elevated stress, cause of high stress, and healthy ways to reduce stress.   Nutrition I: Fats: - Discuss the types of cholesterol, what cholesterol does to the body, and how cholesterol levels can be controlled.   Nutrition II: Labels: -Discuss the different components of food labels and how to read food labels.   Respiratory Infections: - Discuss the signs and symptoms of respiratory infections, ways to prevent respiratory infections, and the importance of seeking medical treatment when having a respiratory infection.   Stress I: Signs and Symptoms: - Discuss the causes of stress, how stress may lead to anxiety and depression, and ways to limit stress.   Stress II: Relaxation: -Discuss relaxation techniques to limit stress. Flowsheet Row PULMONARY REHAB OTHER RESPIRATORY from 01/24/2022 in [place]  Date 12/13/21  Educator DM  Instruction Review Code 1- Verbalizes Understanding       Oxygen for Home/Travel: - Discuss how to prepare for travel when on oxygen and proper ways to transport and store oxygen to ensure safety. Flowsheet Row PULMONARY REHAB OTHER RESPIRATORY from 01/24/2022 in [place]  [facility]  Date 12/20/21  Educator DF  Instruction Review Code 1- Verbalizes Understanding       Knowledge Questionnaire Score:  Knowledge Questionnaire Score - 12/06/21 1248       Knowledge Questionnaire Score   Pre Score 14/18             Core Components/Risk Factors/Patient Goals at Admission:  Personal Goals and Risk Factors at Admission - 12/06/21 1345       Core Components/Risk Factors/Patient Goals on Admission    Weight Management Obesity;Yes    Intervention Weight Management: Provide education and appropriate resources to help participant work on and attain dietary goals.;Weight  Management/Obesity: Establish reasonable short term and long term weight goals.;Obesity: Provide education and appropriate resources to help participant work on and attain dietary goals.    Admit Weight 264 lb 4.8 oz (119.9 kg)    Expected Outcomes Weight Gain: Understanding of general recommendations for a high calorie, high protein meal plan that promotes weight gain by distributing calorie intake throughout the day with the consumption for 4-5 meals, snacks, and/or supplements    Improve shortness of breath with ADL's Yes    Intervention Provide education, individualized exercise plan and daily activity instruction to help decrease symptoms of SOB with activities of daily living.    Expected Outcomes Short Term: Improve cardiorespiratory fitness to achieve a reduction of symptoms when performing ADLs;Long Term: Be able to perform more ADLs without symptoms or delay the onset of symptoms    Personal Goal Other Yes    Personal Goal Patient wants to have more energy build muscles and lose weight.    Intervention Patient will attend PR 2 days/week with exercise and education and supplement with exercise at home.    Expected Outcomes Patient will complete the program meeting both personal and program goals.             Core Components/Risk Factors/Patient Goals Review:   Goals and Risk Factor Review     Row Name 12/26/21 1253 12/26/21 1254 01/22/22 0916         Core Components/Risk Factors/Patient Goals Review   Personal Goals Review Weight Management/Obesity;Stress -- Weight Management/Obesity;Stress     Review -- has completed 5 session in PR.  Tolerating program well.  Exercises without O2 and Sats are running93-95% on room air.  Socializing well with staff and other clients.  Vital signs has been stable.  Pt has been maintaining her weight thus far. Maguire has completed 11 session in PR.  Tolerating program well.  Exercises without O2 and Sats are running 91-95% on room air.  Socializing  well with staff and other clients.  Vital signs has been within guidelines.  Pt has been maintaining her weight thus far.  CT scan (chest) done on 01/04/2022, showing improvments from previous CT scan done in 2019.     Expected Outcomes -- Plan of care is ongoing.  No further concerns as of present. Plan of care is ongoing.  No further concerns as of present.              Core Components/Risk Factors/Patient Goals at Discharge (Final Review):   Goals and Risk Factor Review - 01/22/22 0916       Core Components/Risk Factors/Patient Goals Review   Personal Goals Review Weight Management/Obesity;Stress    Review Derica has completed 11 session in PR.  Tolerating program well.  Exercises without O2 and Sats are running 91-95% on room air.  Socializing well with staff  and other clients.  Vital signs has been within guidelines.  Pt has been maintaining her weight thus far.  CT scan (chest) done on 01/04/2022, showing improvments from previous CT scan done in 2019.    Expected Outcomes Plan of care is ongoing.  No further concerns as of present.             ITP Comments:   Comments: ITP REVIEW Pt is making expected progress toward pulmonary rehab goals after completing 14 sessions. Recommend continued exercise, life style modification, education, and utilization of breathing techniques to increase stamina and strength and decrease shortness of breath with exertion.

## 2022-01-30 NOTE — Progress Notes (Signed)
Pulmonary Individual Treatment Plan  Patient Details  Name: Paula [name] MRN: [id number] Date of Birth: Jun 06, 1950 Referring Provider:   [facility] from 12/06/2021 in [facility]  Referring Provider Dr. Georgann Housekeeper       Initial Encounter Date:  Flowsheet Row PULMONARY REHAB OTHER RESP ORIENTATION from 12/06/2021 in [facility]  Date 12/06/21       Visit Diagnosis: Chronic obstructive pulmonary disease, unspecified COPD type ([place])  Patient's Home Medications on Admission:   Current Outpatient Medications:    acetaminophen (TYLENOL) 500 MG tablet, Take 500-1,000 mg by mouth every 6 (six) hours as needed (pain.)., Disp: , Rfl:    albuterol (VENTOLIN HFA) 108 (90 Base) MCG/ACT inhaler, Inhale 2 puffs into the lungs every 6 (six) hours as needed for wheezing or shortness of breath., Disp: , Rfl:    azelastine (ASTELIN) 0.1 % nasal spray, Place 2 sprays into both nostrils 2 (two) times daily as needed for allergies. Use in each nostril as directed, Disp: , Rfl:    budesonide-formoterol (SYMBICORT) 160-4.5 MCG/ACT inhaler, Inhale 2 puffs into the lungs 2 (two) times daily., Disp: , Rfl:    Cholecalciferol (VITAMIN D) 125 MCG (5000 UT) CAPS, Take 5,000 Units by mouth in the morning., Disp: , Rfl:    clotrimazole-betamethasone (LOTRISONE) cream, Apply 1 application topically 2 (two) times daily as needed (skin irritation.). , Disp: , Rfl:    diclofenac (VOLTAREN) 75 MG EC tablet, Take 75 mg by mouth daily as needed (arthritis pain)., Disp: , Rfl:    ezetimibe (ZETIA) 10 MG tablet, Take 10 mg by mouth in the morning., Disp: , Rfl:    famotidine (PEPCID) 40 MG tablet, Take 40 mg by mouth at bedtime., Disp: , Rfl:    fluticasone (FLONASE) 50 MCG/ACT nasal spray, Place 2 sprays into both nostrils daily as needed for allergies., Disp: , Rfl:    guaiFENesin (MUCINEX) 600 MG 12 hr tablet, Take 600 mg by mouth daily as  needed (congestion/COPD issues)., Disp: , Rfl:    hydrochlorothiazide (HYDRODIURIL) 25 MG tablet, Take 25 mg by mouth in the morning., Disp: , Rfl:    Ketotifen Fumarate (ALLERGY EYE DROPS OP), Place 1 drop into both eyes 2 (two) times daily as needed (allergy eyes)., Disp: , Rfl:    loratadine (CLARITIN) 10 MG tablet, Take 10 mg by mouth daily as needed for allergies., Disp: , Rfl:    losartan (COZAAR) 100 MG tablet, Take 100 mg by mouth in the morning., Disp: , Rfl:    montelukast (SINGULAIR) 10 MG tablet, Take 10 mg by mouth at bedtime., Disp: , Rfl:    Multiple Vitamins-Minerals (PRESERVISION AREDS 2 PO), Take 1 tablet by mouth in the morning and at bedtime., Disp: , Rfl:    NON FORMULARY, CPAP, O2 at night, Disp: , Rfl:    omeprazole (PRILOSEC) 40 MG capsule, Take 40 mg by mouth daily before breakfast., Disp: , Rfl:    sertraline (ZOLOFT) 100 MG tablet, Take 100 mg by mouth at bedtime., Disp: , Rfl:    terbinafine (LAMISIL) 250 MG tablet, Take 250 mg by mouth in the morning., Disp: , Rfl:   Past Medical History: Past Medical History:  Diagnosis Date   Anxiety    Arthritis    COPD (chronic obstructive pulmonary disease) ([place])    Heart murmur    Hypertension    Lung nodule    LLL   Sleep apnea    CPAP  with O2 2 liters    Tubal ectopic pregnancy 1970   Vitelliform macular dystrophy     Tobacco Use: Social History   Tobacco Use  Smoking Status Former   Packs/day: 0.75   Years: 20.00   Total pack years: 15.00   Types: Cigarettes  Smokeless Tobacco Former   Quit date: 10/31/1998    Labs: Review Flowsheet        No data to display          Capillary Blood Glucose: No results found for: "GLUCAP"   Pulmonary Assessment Scores:  Pulmonary Assessment Scores     Row Name 12/06/21 1250         ADL UCSD   ADL Phase Entry     SOB Score total 33     Rest 0     Walk 3     Stairs 4     Bath 0     Dress 1     Shop 2       CAT Score   CAT Score 11       mMRC  Score   mMRC Score 2             UCSD: Self-administered rating of dyspnea associated with activities of daily living (ADLs) 6-point scale (0 = "not at all" to 5 = "maximal or unable to do because of breathlessness")  Scoring Scores range from 0 to 120.  Minimally important difference is 5 units  CAT: CAT can identify the health impairment of COPD patients and is better correlated with disease progression.  CAT has a scoring range of zero to 40. The CAT score is classified into four groups of low (less than 10), medium (10 - 20), high (21-30) and very high (31-40) based on the impact level of disease on health status. A CAT score over 10 suggests significant symptoms.  A worsening CAT score could be explained by an exacerbation, poor medication adherence, poor inhaler technique, or progression of COPD or comorbid conditions.  CAT MCID is 2 points  mMRC: mMRC (Modified Medical Research Council) Dyspnea Scale is used to assess the degree of baseline functional disability in patients of respiratory disease due to dyspnea. No minimal important difference is established. A decrease in score of 1 point or greater is considered a positive change.   Pulmonary Function Assessment:   Exercise Target Goals: Exercise Program Goal: Individual exercise prescription set using results from initial 6 min walk test and THRR while considering  patient's activity barriers and safety.   Exercise Prescription Goal: Initial exercise prescription builds to 30-45 minutes a day of aerobic activity, 2-3 days per week.  Home exercise guidelines will be given to patient during program as part of exercise prescription that the participant will acknowledge.  Activity Barriers & Risk Stratification:  Activity Barriers & Cardiac Risk Stratification - 12/06/21 1313       Activity Barriers & Cardiac Risk Stratification   Activity Barriers Arthritis;Shortness of Breath    Cardiac Risk Stratification Low              6 Minute Walk:  6 Minute Walk     Row Name 12/06/21 1357         6 Minute Walk   Phase Initial     Distance 800 feet     Walk Time 6 minutes     # of Rest Breaks 2     MPH 1.51     METS 1.11  RPE 14     Perceived Dyspnea  14     VO2 Peak 3.89     Symptoms Yes (comment)     Comments two standing breaks due to SOB and bilateral hip pain (6/10) 20 & 30 seconds.     Resting HR 73 bpm     Resting BP 134/66     Resting Oxygen Saturation  94 %     Exercise Oxygen Saturation  during 6 min walk 87 %     Max Ex. HR 120 bpm     Max Ex. BP 160/66     2 Minute Post BP 140/66       Interval HR   1 Minute HR 100     2 Minute HR 103     3 Minute HR 103     4 Minute HR 113     5 Minute HR 120     6 Minute HR 115     2 Minute Post HR 76     Interval Heart Rate? Yes       Interval Oxygen   Interval Oxygen? Yes     Baseline Oxygen Saturation % 94 %     1 Minute Oxygen Saturation % 87 %     1 Minute Liters of Oxygen 0 L     2 Minute Oxygen Saturation % 88 %     2 Minute Liters of Oxygen 0 L     3 Minute Oxygen Saturation % 90 %     3 Minute Liters of Oxygen 0 L     4 Minute Oxygen Saturation % 87 %     4 Minute Liters of Oxygen 0 L     5 Minute Oxygen Saturation % 87 %     5 Minute Liters of Oxygen 0 L     6 Minute Oxygen Saturation % 87 %     6 Minute Liters of Oxygen 0 L     2 Minute Post Oxygen Saturation % 95 %     2 Minute Post Liters of Oxygen 0 L              Oxygen Initial Assessment:  Oxygen Initial Assessment - 12/06/21 1249       Home Oxygen   Home Oxygen Device Home Concentrator    Sleep Oxygen Prescription Continuous;CPAP    Liters per minute 2    Home Exercise Oxygen Prescription None    Home Resting Oxygen Prescription None    Compliance with Home Oxygen Use Yes      Initial 6 min Walk   Oxygen Used None      Program Oxygen Prescription   Program Oxygen Prescription None      Intervention   Short Term Goals To learn and exhibit  compliance with exercise, home and travel O2 prescription;To learn and understand importance of monitoring SPO2 with pulse oximeter and demonstrate accurate use of the pulse oximeter.;To learn and understand importance of maintaining oxygen saturations>88%;To learn and demonstrate proper pursed lip breathing techniques or other breathing techniques.     Long  Term Goals Exhibits compliance with exercise, home  and travel O2 prescription;Verbalizes importance of monitoring SPO2 with pulse oximeter and return demonstration;Maintenance of O2 saturations>88%;Exhibits proper breathing techniques, such as pursed lip breathing or other method taught during program session             Oxygen Re-Evaluation:  Oxygen Re-Evaluation     Row Name 01/01/22 1344 01/29/22 1334  Program Oxygen Prescription   Program Oxygen Prescription None None        Home Oxygen   Home Oxygen Device Home Concentrator Home Concentrator      Sleep Oxygen Prescription Continuous;CPAP Continuous;CPAP      Liters per minute 2 2      Home Exercise Oxygen Prescription None None      Home Resting Oxygen Prescription None None      Compliance with Home Oxygen Use Yes Yes        Goals/Expected Outcomes   Short Term Goals To learn and exhibit compliance with exercise, home and travel O2 prescription;To learn and understand importance of monitoring SPO2 with pulse oximeter and demonstrate accurate use of the pulse oximeter.;To learn and understand importance of maintaining oxygen saturations>88%;To learn and demonstrate proper pursed lip breathing techniques or other breathing techniques.  To learn and exhibit compliance with exercise, home and travel O2 prescription;To learn and understand importance of monitoring SPO2 with pulse oximeter and demonstrate accurate use of the pulse oximeter.;To learn and understand importance of maintaining oxygen saturations>88%;To learn and demonstrate proper pursed lip breathing  techniques or other breathing techniques.       Long  Term Goals Exhibits compliance with exercise, home  and travel O2 prescription;Verbalizes importance of monitoring SPO2 with pulse oximeter and return demonstration;Maintenance of O2 saturations>88%;Exhibits proper breathing techniques, such as pursed lip breathing or other method taught during program session Exhibits compliance with exercise, home  and travel O2 prescription;Verbalizes importance of monitoring SPO2 with pulse oximeter and return demonstration;Maintenance of O2 saturations>88%;Exhibits proper breathing techniques, such as pursed lip breathing or other method taught during program session      Goals/Expected Outcomes compliance compliance               Oxygen Discharge (Final Oxygen Re-Evaluation):  Oxygen Re-Evaluation - 01/29/22 1334       Program Oxygen Prescription   Program Oxygen Prescription None      Home Oxygen   Home Oxygen Device Home Concentrator    Sleep Oxygen Prescription Continuous;CPAP    Liters per minute 2    Home Exercise Oxygen Prescription None    Home Resting Oxygen Prescription None    Compliance with Home Oxygen Use Yes      Goals/Expected Outcomes   Short Term Goals To learn and exhibit compliance with exercise, home and travel O2 prescription;To learn and understand importance of monitoring SPO2 with pulse oximeter and demonstrate accurate use of the pulse oximeter.;To learn and understand importance of maintaining oxygen saturations>88%;To learn and demonstrate proper pursed lip breathing techniques or other breathing techniques.     Long  Term Goals Exhibits compliance with exercise, home  and travel O2 prescription;Verbalizes importance of monitoring SPO2 with pulse oximeter and return demonstration;Maintenance of O2 saturations>88%;Exhibits proper breathing techniques, such as pursed lip breathing or other method taught during program session    Goals/Expected Outcomes compliance              Initial Exercise Prescription:  Initial Exercise Prescription - 12/06/21 1400       Date of Initial Exercise RX and Referring Provider   Date 12/06/21    Referring Provider Dr. Georgann Housekeeper    Expected Discharge Date 04/11/22      NuStep   Level 1    SPM 60    Minutes 22      Arm Ergometer   Level 1    RPM 45    Minutes 17  Prescription Details   Frequency (times per week) 2    Duration Progress to 30 minutes of continuous aerobic without signs/symptoms of physical distress      Intensity   THRR 40-80% of Max Heartrate 52-118    Ratings of Perceived Exertion 11-13    Perceived Dyspnea 0-4      Resistance Training   Training Prescription Yes    Weight 2    Reps 10-15             Perform Capillary Blood Glucose checks as needed.  Exercise Prescription Changes:   Exercise Prescription Changes     Row Name 12/20/21 1200 01/01/22 1300 01/15/22 1200 01/29/22 1300       Response to Exercise   Blood Pressure (Admit) 132/76 128/60 144/64 128/72    Blood Pressure (Exercise) 160/78 142/70 160/70 160/72    Blood Pressure (Exit) 118/64 126/64 124/72 138/90    Heart Rate (Admit) 70 bpm 76 bpm 82 bpm 95 bpm    Heart Rate (Exercise) 79 bpm 85 bpm 90 bpm 95 bpm    Heart Rate (Exit) 64 bpm 70 bpm 72 bpm 83 bpm    Oxygen Saturation (Admit) 94 % 95 % 95 % 93 %    Oxygen Saturation (Exercise) 93 % 92 % 94 % 92 %    Oxygen Saturation (Exit) 95 % 95 % 94 % 95 %    Rating of Perceived Exertion (Exercise) '12 12 12 12    '$ Perceived Dyspnea (Exercise) '12 12 12 12    '$ Duration Continue with 30 min of aerobic exercise without signs/symptoms of physical distress. Continue with 30 min of aerobic exercise without signs/symptoms of physical distress. Continue with 30 min of aerobic exercise without signs/symptoms of physical distress. Continue with 30 min of aerobic exercise without signs/symptoms of physical distress.    Intensity THRR unchanged THRR unchanged THRR unchanged THRR  unchanged      Progression   Progression Continue to progress workloads to maintain intensity without signs/symptoms of physical distress. Continue to progress workloads to maintain intensity without signs/symptoms of physical distress. Continue to progress workloads to maintain intensity without signs/symptoms of physical distress. Continue to progress workloads to maintain intensity without signs/symptoms of physical distress.      Resistance Training   Training Prescription Yes Yes Yes Yes    Weight '2 3 3 3    '$ Reps 10-15 10-15 10-15 10-15    Time 10 Minutes 10 Minutes 10 Minutes 10 Minutes      NuStep   Level '1 2 2 2    '$ SPM 91 102 107 106    Minutes '22 22 22 22    '$ METs 2 2 2.1 1.9      Arm Ergometer   Level '1 1 2 2    '$ RPM 64 70 66 69    Minutes '17 17 17 17    '$ METs 1.6 1.7 2.1 2.2             Exercise Comments:   Exercise Goals and Review:   Exercise Goals     Row Name 12/06/21 1402 01/01/22 1342 01/29/22 1328         Exercise Goals   Increase Physical Activity Yes Yes Yes     Intervention Provide advice, education, support and counseling about physical activity/exercise needs.;Develop an individualized exercise prescription for aerobic and resistive training based on initial evaluation findings, risk stratification, comorbidities and participant's personal goals. Provide advice, education, support and counseling about physical activity/exercise  needs.;Develop an individualized exercise prescription for aerobic and resistive training based on initial evaluation findings, risk stratification, comorbidities and participant's personal goals. Provide advice, education, support and counseling about physical activity/exercise needs.;Develop an individualized exercise prescription for aerobic and resistive training based on initial evaluation findings, risk stratification, comorbidities and participant's personal goals.     Expected Outcomes Short Term: Attend rehab on a regular  basis to increase amount of physical activity.;Long Term: Add in home exercise to make exercise part of routine and to increase amount of physical activity.;Long Term: Exercising regularly at least 3-5 days a week. Short Term: Attend rehab on a regular basis to increase amount of physical activity.;Long Term: Add in home exercise to make exercise part of routine and to increase amount of physical activity.;Long Term: Exercising regularly at least 3-5 days a week. Short Term: Attend rehab on a regular basis to increase amount of physical activity.;Long Term: Add in home exercise to make exercise part of routine and to increase amount of physical activity.;Long Term: Exercising regularly at least 3-5 days a week.     Increase Strength and Stamina Yes Yes Yes     Intervention Provide advice, education, support and counseling about physical activity/exercise needs.;Develop an individualized exercise prescription for aerobic and resistive training based on initial evaluation findings, risk stratification, comorbidities and participant's personal goals. Provide advice, education, support and counseling about physical activity/exercise needs.;Develop an individualized exercise prescription for aerobic and resistive training based on initial evaluation findings, risk stratification, comorbidities and participant's personal goals. Provide advice, education, support and counseling about physical activity/exercise needs.;Develop an individualized exercise prescription for aerobic and resistive training based on initial evaluation findings, risk stratification, comorbidities and participant's personal goals.     Expected Outcomes Short Term: Increase workloads from initial exercise prescription for resistance, speed, and METs.;Short Term: Perform resistance training exercises routinely during rehab and add in resistance training at home;Long Term: Improve cardiorespiratory fitness, muscular endurance and strength as measured  by increased METs and functional capacity (6MWT) Short Term: Increase workloads from initial exercise prescription for resistance, speed, and METs.;Short Term: Perform resistance training exercises routinely during rehab and add in resistance training at home;Long Term: Improve cardiorespiratory fitness, muscular endurance and strength as measured by increased METs and functional capacity (6MWT) Short Term: Increase workloads from initial exercise prescription for resistance, speed, and METs.;Short Term: Perform resistance training exercises routinely during rehab and add in resistance training at home;Long Term: Improve cardiorespiratory fitness, muscular endurance and strength as measured by increased METs and functional capacity (6MWT)     Able to understand and use rate of perceived exertion (RPE) scale Yes Yes Yes     Intervention Provide education and explanation on how to use RPE scale Provide education and explanation on how to use RPE scale Provide education and explanation on how to use RPE scale     Expected Outcomes Short Term: Able to use RPE daily in rehab to express subjective intensity level;Long Term:  Able to use RPE to guide intensity level when exercising independently Short Term: Able to use RPE daily in rehab to express subjective intensity level;Long Term:  Able to use RPE to guide intensity level when exercising independently Short Term: Able to use RPE daily in rehab to express subjective intensity level;Long Term:  Able to use RPE to guide intensity level when exercising independently     Able to understand and use Dyspnea scale Yes Yes Yes     Intervention Provide education and explanation on how to  use Dyspnea scale Provide education and explanation on how to use Dyspnea scale Provide education and explanation on how to use Dyspnea scale     Expected Outcomes Short Term: Able to use Dyspnea scale daily in rehab to express subjective sense of shortness of breath during exertion;Long  Term: Able to use Dyspnea scale to guide intensity level when exercising independently Short Term: Able to use Dyspnea scale daily in rehab to express subjective sense of shortness of breath during exertion;Long Term: Able to use Dyspnea scale to guide intensity level when exercising independently Short Term: Able to use Dyspnea scale daily in rehab to express subjective sense of shortness of breath during exertion;Long Term: Able to use Dyspnea scale to guide intensity level when exercising independently     Knowledge and understanding of Target Heart Rate Range (THRR) Yes Yes Yes     Intervention Provide education and explanation of THRR including how the numbers were predicted and where they are located for reference Provide education and explanation of THRR including how the numbers were predicted and where they are located for reference Provide education and explanation of THRR including how the numbers were predicted and where they are located for reference     Expected Outcomes Short Term: Able to state/look up THRR;Short Term: Able to use daily as guideline for intensity in rehab;Long Term: Able to use THRR to govern intensity when exercising independently Short Term: Able to state/look up THRR;Short Term: Able to use daily as guideline for intensity in rehab;Long Term: Able to use THRR to govern intensity when exercising independently Short Term: Able to state/look up THRR;Short Term: Able to use daily as guideline for intensity in rehab;Long Term: Able to use THRR to govern intensity when exercising independently     Able to check pulse independently Yes Yes --     Intervention Provide education and demonstration on how to check pulse in carotid and radial arteries.;Review the importance of being able to check your own pulse for safety during independent exercise Provide education and demonstration on how to check pulse in carotid and radial arteries.;Review the importance of being able to check your  own pulse for safety during independent exercise Provide education and demonstration on how to check pulse in carotid and radial arteries.;Review the importance of being able to check your own pulse for safety during independent exercise     Expected Outcomes Short Term: Able to explain why pulse checking is important during independent exercise;Long Term: Able to check pulse independently and accurately Short Term: Able to explain why pulse checking is important during independent exercise;Long Term: Able to check pulse independently and accurately Short Term: Able to explain why pulse checking is important during independent exercise;Long Term: Able to check pulse independently and accurately     Understanding of Exercise Prescription Yes Yes Yes     Intervention Provide education, explanation, and written materials on patient's individual exercise prescription Provide education, explanation, and written materials on patient's individual exercise prescription Provide education, explanation, and written materials on patient's individual exercise prescription     Expected Outcomes Short Term: Able to explain program exercise prescription;Long Term: Able to explain home exercise prescription to exercise independently Short Term: Able to explain program exercise prescription;Long Term: Able to explain home exercise prescription to exercise independently Short Term: Able to explain program exercise prescription;Long Term: Able to explain home exercise prescription to exercise independently              Exercise Goals Re-Evaluation :  Exercise Goals Re-Evaluation     Row Name 01/01/22 1342 01/29/22 1329 01/30/22 0731         Exercise Goal Re-Evaluation   Exercise Goals Review Increase Physical Activity;Increase Strength and Stamina;Able to understand and use rate of perceived exertion (RPE) scale;Able to understand and use Dyspnea scale;Knowledge and understanding of Target Heart Rate Range  (THRR);Understanding of Exercise Prescription Increase Physical Activity;Increase Strength and Stamina;Able to understand and use rate of perceived exertion (RPE) scale;Able to understand and use Dyspnea scale;Knowledge and understanding of Target Heart Rate Range (THRR);Understanding of Exercise Prescription --     Comments Pt has completed 7 sessions of PR. She is motivated during class and has increased her levels on the stepper. She has increased her weights and band for the warm up. She is currently exercising at 2.0 METs on the stepper. Will continue to monitor and progress as able. Pt has completed 13 sessions of PR. She continues to increase her workloads on both pieces of equipment. She enjous coming to class each week. She is currently exercising at 2.2 METs on the AE. Will continue to monitor and progress as able. --     Expected Outcomes Through exercise at home and at rehab, the patient will meet their stated goals. Through exercise at home and at rehab, the patient will meet their stated goals. Through exercise at home and at rehab, the patient will meet their stated goals.              Discharge Exercise Prescription (Final Exercise Prescription Changes):  Exercise Prescription Changes - 01/29/22 1300       Response to Exercise   Blood Pressure (Admit) 128/72    Blood Pressure (Exercise) 160/72    Blood Pressure (Exit) 138/90    Heart Rate (Admit) 95 bpm    Heart Rate (Exercise) 95 bpm    Heart Rate (Exit) 83 bpm    Oxygen Saturation (Admit) 93 %    Oxygen Saturation (Exercise) 92 %    Oxygen Saturation (Exit) 95 %    Rating of Perceived Exertion (Exercise) 12    Perceived Dyspnea (Exercise) 12    Duration Continue with 30 min of aerobic exercise without signs/symptoms of physical distress.    Intensity THRR unchanged      Progression   Progression Continue to progress workloads to maintain intensity without signs/symptoms of physical distress.      Resistance Training    Training Prescription Yes    Weight 3    Reps 10-15    Time 10 Minutes      NuStep   Level 2    SPM 106    Minutes 22    METs 1.9      Arm Ergometer   Level 2    RPM 69    Minutes 17    METs 2.2             Nutrition:  Target Goals: Understanding of nutrition guidelines, daily intake of sodium '1500mg'$ , cholesterol '200mg'$ , calories 30% from fat and 7% or less from saturated fats, daily to have 5 or more servings of fruits and vegetables.  Biometrics:  Pre Biometrics - 12/06/21 1402       Pre Biometrics   Height 5' (1.524 m)    Weight 119.8 kg    Waist Circumference 54 inches    Hip Circumference 58 inches    Waist to Hip Ratio 0.93 %    BMI (Calculated) 51.58    Triceps Skinfold 45  mm    % Body Fat 63.3 %    Grip Strength 12.3 kg    Flexibility 0 in    Single Leg Stand 0 seconds              Nutrition Therapy Plan and Nutrition Goals:  Nutrition Therapy & Goals - 12/06/21 1246       Personal Nutrition Goals   Comments Patient scored 52 on her diet assessment. Handout provided and explained regarding healthier choices. We offer 2 educational sessions on heart healthy nutrition with handouts and assistance with RD referral if patient is interested.      Intervention Plan   Intervention Nutrition handout(s) given to patient.    Expected Outcomes Short Term Goal: Understand basic principles of dietary content, such as calories, fat, sodium, cholesterol and nutrients.             Nutrition Assessments:  Nutrition Assessments - 12/06/21 1246       MEDFICTS Scores   Pre Score 52            MEDIFICTS Score Key: ?70 Need to make dietary changes  40-70 Heart Healthy Diet ? 40 Therapeutic Level Cholesterol Diet   Picture Your Plate Scores: <73 Unhealthy dietary pattern with much room for improvement. 41-50 Dietary pattern unlikely to meet recommendations for good health and room for improvement. 51-60 More healthful dietary pattern, with  some room for improvement.  >60 Healthy dietary pattern, although there may be some specific behaviors that could be improved.    Nutrition Goals Re-Evaluation:   Nutrition Goals Discharge (Final Nutrition Goals Re-Evaluation):   Psychosocial: Target Goals: Acknowledge presence or absence of significant depression and/or stress, maximize coping skills, provide positive support system. Participant is able to verbalize types and ability to use techniques and skills needed for reducing stress and depression.  Initial Review & Psychosocial Screening:  Initial Psych Review & Screening - 12/06/21 1348       Initial Review   Current issues with Current Psychotropic Meds;Current Depression;Current Sleep Concerns      Family Dynamics   Good Support System? Yes      Barriers   Psychosocial barriers to participate in program There are no identifiable barriers or psychosocial needs.      Screening Interventions   Interventions Encouraged to exercise;Provide feedback about the scores to participant    Expected Outcomes Short Term goal: Identification and review with participant of any Quality of Life or Depression concerns found by scoring the questionnaire.             Quality of Life Scores:  Quality of Life - 12/06/21 1403       Quality of Life   Select Quality of Life      Quality of Life Scores   Health/Function Pre 11.63 %    Socioeconomic Pre 17.14 %    Psych/Spiritual Pre 14.93 %    Family Pre 21 %    GLOBAL Pre 14.54 %            Scores of 19 and below usually indicate a poorer quality of life in these areas.  A difference of  2-3 points is a clinically meaningful difference.  A difference of 2-3 points in the total score of the Quality of Life Index has been associated with significant improvement in overall quality of life, self-image, physical symptoms, and general health in studies assessing change in quality of life.   PHQ-9: Review Flowsheet  12/06/2021  Depression screen PHQ 2/9  Decreased Interest 2  Down, Depressed, Hopeless 0  PHQ - 2 Score 2  Altered sleeping 1  Tired, decreased energy 2  Change in appetite 2  Feeling bad or failure about yourself  0  Trouble concentrating 0  Moving slowly or fidgety/restless 0  Suicidal thoughts 0  PHQ-9 Score 7  Difficult doing work/chores Somewhat difficult   Interpretation of Total Score  Total Score Depression Severity:  1-4 = Minimal depression, 5-9 = Mild depression, 10-14 = Moderate depression, 15-19 = Moderately severe depression, 20-27 = Severe depression   Psychosocial Evaluation and Intervention:  Psychosocial Evaluation - 12/06/21 1349       Psychosocial Evaluation & Interventions   Interventions Stress management education;Relaxation education;Encouraged to exercise with the program and follow exercise prescription    Comments Patient has no psychosocial barriers identified to participate in PR at her orientation visit. Her PHQ-9 score was 7 due to her lack of energy and sleep concerns mainly. She says she started taking the Zoloft a few years ago when her COPD started preventing her from doing the things she wanted to do. She feels her depression is managed well. She lives with her husband of many years. They do not have children but she says she help raise 2 of her first cousins and their children as well. She names her husband and 2 cousins as her support people. She is a retired Licensed conveyancer. She says she used to be very active in her yard and garden and wants to be able to do these things again. She is ready to start the program hoping to get back to some of these activities.    Expected Outcomes Patient's depression will continue to be managed and she will continue to have no psychosocial barriers identified.    Continue Psychosocial Services  No Follow up required             Psychosocial Re-Evaluation:  Psychosocial Re-Evaluation     [place] Name 12/26/21 1248  01/22/22 0909           Psychosocial Re-Evaluation   Comments Fauna is doing well in her first sessions in PR.  Pt is working towards being able to work in her yard and increased energy for [employer].  Well supported by her husband.  Pts depression is mamnaged will with Zoloft. Pt is working towards being able to work in her yard and increased energy for [employer].  Well supported by her husband.  Pts depression is mamnaged will with Zoloft.  Shenekia seems to be enjoying the program and socializing with with class members and staff.  Patient has no current psychosocial issues currently.      Expected Outcomes Pt will complete the progra, meeting both program and personal goals. Pt will complete the program, meeting both program and personal goals.      Interventions Relaxation education;Encouraged to attend Pulmonary Rehabilitation for the exercise;Stress management education Relaxation education;Encouraged to attend Pulmonary Rehabilitation for the exercise;Stress management education      Continue Psychosocial Services  No Follow up required No Follow up required               Psychosocial Discharge (Final Psychosocial Re-Evaluation):  Psychosocial Re-Evaluation - 01/22/22 0909       Psychosocial Re-Evaluation   Comments Pt is working towards being able to work in her yard and increased energy for [employer].  Well supported by her husband.  Pts depression is mamnaged will with Zoloft.  Sriya seems to be enjoying the program and socializing with with class members and staff.  Patient has no current psychosocial issues currently.    Expected Outcomes Pt will complete the program, meeting both program and personal goals.    Interventions Relaxation education;Encouraged to attend Pulmonary Rehabilitation for the exercise;Stress management education    Continue Psychosocial Services  No Follow up required              Education: Education Goals: Education classes will be provided on a weekly basis,  covering required topics. Participant will state understanding/return demonstration of topics presented.  Learning Barriers/Preferences:  Learning Barriers/Preferences - 12/06/21 1345       Learning Barriers/Preferences   Learning Barriers None    Learning Preferences Written Material;Skilled Demonstration             Education Topics: How Lungs Work and Diseases: - Discuss the anatomy of the lungs and diseases that can affect the lungs, such as COPD. Flowsheet Row PULMONARY REHAB OTHER RESPIRATORY from 01/24/2022 in [place]  Date 12/27/21  Educator DF  Instruction Review Code 1- Verbalizes Understanding       Exercise: -Discuss the importance of exercise, FITT principles of exercise, normal and abnormal responses to exercise, and how to exercise safely.   Environmental Irritants: -Discuss types of environmental irritants and how to limit exposure to environmental irritants.   Meds/Inhalers and oxygen: - Discuss respiratory medications, definition of an inhaler and oxygen, and the proper way to use an inhaler and oxygen. Flowsheet Row PULMONARY REHAB OTHER RESPIRATORY from 01/24/2022 in [place]  Date 01/10/22  Educator HJ       Energy Saving Techniques: - Discuss methods to conserve energy and decrease shortness of breath when performing activities of daily living.  Flowsheet Row PULMONARY REHAB OTHER RESPIRATORY from 01/24/2022 in [place]  Date 01/17/22  Educator Handout  Instruction Review Code 1- Verbalizes Understanding       Bronchial Hygiene / Breathing Techniques: - Discuss breathing mechanics, pursed-lip breathing technique,  proper posture, effective ways to clear airways, and other functional breathing techniques Flowsheet Row PULMONARY REHAB OTHER RESPIRATORY from 01/24/2022 in [place]  Date 01/24/22  Educator HB  Instruction Review Code 1-  Research scientist (medical): - Provides group verbal and written instruction about the health risks of elevated stress, cause of high stress, and healthy ways to reduce stress.   Nutrition I: Fats: - Discuss the types of cholesterol, what cholesterol does to the body, and how cholesterol levels can be controlled.   Nutrition II: Labels: -Discuss the different components of food labels and how to read food labels.   Respiratory Infections: - Discuss the signs and symptoms of respiratory infections, ways to prevent respiratory infections, and the importance of seeking medical treatment when having a respiratory infection.   Stress I: Signs and Symptoms: - Discuss the causes of stress, how stress may lead to anxiety and depression, and ways to limit stress.   Stress II: Relaxation: -Discuss relaxation techniques to limit stress. Flowsheet Row PULMONARY REHAB OTHER RESPIRATORY from 01/24/2022 in [place]  Date 12/13/21  Educator DM  Instruction Review Code 1- Verbalizes Understanding       Oxygen for Home/Travel: - Discuss how to prepare for travel when on oxygen and proper ways to transport and store oxygen to ensure safety. Flowsheet Row PULMONARY REHAB OTHER RESPIRATORY from 01/24/2022 in [place]  [facility]  Date 12/20/21  Educator DF  Instruction Review Code 1- Verbalizes Understanding       Knowledge Questionnaire Score:  Knowledge Questionnaire Score - 12/06/21 1248       Knowledge Questionnaire Score   Pre Score 14/18             Core Components/Risk Factors/Patient Goals at Admission:  Personal Goals and Risk Factors at Admission - 12/06/21 1345       Core Components/Risk Factors/Patient Goals on Admission    Weight Management Obesity;Yes    Intervention Weight Management: Provide education and appropriate resources to help participant work on and attain dietary goals.;Weight  Management/Obesity: Establish reasonable short term and long term weight goals.;Obesity: Provide education and appropriate resources to help participant work on and attain dietary goals.    Admit Weight 264 lb 4.8 oz (119.9 kg)    Expected Outcomes Weight Gain: Understanding of general recommendations for a high calorie, high protein meal plan that promotes weight gain by distributing calorie intake throughout the day with the consumption for 4-5 meals, snacks, and/or supplements    Improve shortness of breath with ADL's Yes    Intervention Provide education, individualized exercise plan and daily activity instruction to help decrease symptoms of SOB with activities of daily living.    Expected Outcomes Short Term: Improve cardiorespiratory fitness to achieve a reduction of symptoms when performing ADLs;Long Term: Be able to perform more ADLs without symptoms or delay the onset of symptoms    Personal Goal Other Yes    Personal Goal Patient wants to have more energy build muscles and lose weight.    Intervention Patient will attend PR 2 days/week with exercise and education and supplement with exercise at home.    Expected Outcomes Patient will complete the program meeting both personal and program goals.             Core Components/Risk Factors/Patient Goals Review:   Goals and Risk Factor Review     Row Name 12/26/21 1253 12/26/21 1254 01/22/22 0916         Core Components/Risk Factors/Patient Goals Review   Personal Goals Review Weight Management/Obesity;Stress -- Weight Management/Obesity;Stress     Review -- has completed 5 session in PR.  Tolerating program well.  Exercises without O2 and Sats are running93-95% on room air.  Socializing well with staff and other clients.  Vital signs has been stable.  Pt has been maintaining her weight thus far. Dalila has completed 11 session in PR.  Tolerating program well.  Exercises without O2 and Sats are running 91-95% on room air.  Socializing  well with staff and other clients.  Vital signs has been within guidelines.  Pt has been maintaining her weight thus far.  CT scan (chest) done on 01/04/2022, showing improvments from previous CT scan done in 2019.     Expected Outcomes -- Plan of care is ongoing.  No further concerns as of present. Plan of care is ongoing.  No further concerns as of present.              Core Components/Risk Factors/Patient Goals at Discharge (Final Review):   Goals and Risk Factor Review - 01/22/22 0916       Core Components/Risk Factors/Patient Goals Review   Personal Goals Review Weight Management/Obesity;Stress    Review Brionne has completed 11 session in PR.  Tolerating program well.  Exercises without O2 and Sats are running 91-95% on room air.  Socializing well with staff  and other clients.  Vital signs has been within guidelines.  Pt has been maintaining her weight thus far.  CT scan (chest) done on 01/04/2022, showing improvments from previous CT scan done in 2019.    Expected Outcomes Plan of care is ongoing.  No further concerns as of present.             ITP Comments:   Comments: ITP REVIEW Pt is making expected progress toward pulmonary rehab goals after completing 14 sessions. Recommend continued exercise, life style modification, education, and utilization of breathing techniques to increase stamina and strength and decrease shortness of breath with exertion.

## 2022-01-31 ENCOUNTER — Encounter (HOSPITAL_COMMUNITY): Payer: Medicare Other

## 2022-02-05 ENCOUNTER — Encounter (HOSPITAL_COMMUNITY)
Admission: RE | Admit: 2022-02-05 | Discharge: 2022-02-05 | Disposition: A | Discharge status: Home / Self Care | Payer: Medicare Other | Source: Ambulatory Visit | Attending: Internal Medicine | Admitting: Internal Medicine

## 2022-02-05 DIAGNOSIS — J449 Chronic obstructive pulmonary disease, unspecified: Secondary | ICD-10-CM

## 2022-02-05 NOTE — Progress Notes (Signed)
Daily Session Note  Patient Details  Name: Paula [name] MRN: [id number] Date of Birth: 08/06/1950 Referring Provider:   Flowsheet Row PULMONARY REHAB OTHER RESP ORIENTATION from 12/06/2021 in [place]  Referring Provider Dr. Georgann Housekeeper       Encounter Date: 02/05/2022  Check In:  Session Check In - 02/05/22 1048       Check-In   Supervising physician immediately available to respond to emergencies [facility] MD immediately available    Physician(s) Dr Harl Bowie    Location [facility]    Staff Present Leana Roe, BS, Exercise Physiologist;Dalton Sherrie George, MS, ACSM-CEP;Madelyn Flavors, RN, BSN    Virtual Visit No    Medication changes reported     No    Fall or balance concerns reported    No    Tobacco Cessation No Change    Warm-up and Cool-down Performed as group-led instruction    Resistance Training Performed Yes    VAD Patient? No    PAD/SET Patient? No      Pain Assessment   Currently in Pain? No/denies    Pain Score 0-No pain    Multiple Pain Sites No             Capillary Blood Glucose: No results found for this or any previous visit (from the past 24 hour(s)).    Social History   Tobacco Use  Smoking Status Former   Packs/day: 0.75   Years: 20.00   Total pack years: 15.00   Types: Cigarettes  Smokeless Tobacco Former   Quit date: 10/31/1998    Goals Met:  Proper associated with RPD/PD & O2 Sat Independence with exercise equipment Using PLB without cueing & demonstrates good technique Exercise tolerated well Queuing for purse lip breathing No report of concerns or symptoms today Strength training completed today  Goals Unmet:  Not Applicable  Comments: Checkout at 1145.   Dr. Kathie Dike is Medical Director for [facility].

## 2022-02-07 ENCOUNTER — Encounter (HOSPITAL_COMMUNITY)
Admission: RE | Admit: 2022-02-07 | Discharge: 2022-02-07 | Disposition: A | Discharge status: Home / Self Care | Payer: Medicare Other | Source: Ambulatory Visit | Attending: Internal Medicine | Admitting: Internal Medicine

## 2022-02-07 DIAGNOSIS — J449 Chronic obstructive pulmonary disease, unspecified: Secondary | ICD-10-CM | POA: Diagnosis not present

## 2022-02-07 NOTE — Progress Notes (Signed)
Daily Session Note  Patient Details  Name: Paula [name] MRN: [id number] Date of Birth: 01/05/1951 Referring Provider:   Flowsheet Row PULMONARY REHAB OTHER RESP ORIENTATION from 12/06/2021 in [place]  Referring Provider Dr. Georgann Housekeeper       Encounter Date: 02/07/2022  Check In:  Session Check In - 02/07/22 1045       Check-In   Supervising physician immediately available to respond to emergencies [facility] MD immediately available    Physician(s) Dr Mellody Dance    Location [facility]    Staff Present Leana Roe, BS, Exercise Physiologist;Dalton Sherrie George, MS, ACSM-CEP;Madelyn Flavors, RN, BSN    Virtual Visit No    Medication changes reported     No    Fall or balance concerns reported    No    Tobacco Cessation No Change    Warm-up and Cool-down Performed as group-led instruction    Resistance Training Performed Yes    VAD Patient? No    PAD/SET Patient? No      Pain Assessment   Currently in Pain? No/denies    Pain Score 0-No pain    Multiple Pain Sites No             Capillary Blood Glucose: No results found for this or any previous visit (from the past 24 hour(s)).    Social History   Tobacco Use  Smoking Status Former   Packs/day: 0.75   Years: 20.00   Total pack years: 15.00   Types: Cigarettes  Smokeless Tobacco Former   Quit date: 10/31/1998    Goals Met:  Proper associated with RPD/PD & O2 Sat Independence with exercise equipment Using PLB without cueing & demonstrates good technique Exercise tolerated well Queuing for purse lip breathing No report of concerns or symptoms today Strength training completed today  Goals Unmet:  Not Applicable  Comments: Checkout at 1145.   Dr. Kathie Dike is Medical Director for [facility].

## 2022-02-12 ENCOUNTER — Encounter (HOSPITAL_COMMUNITY)
Admission: RE | Admit: 2022-02-12 | Discharge: 2022-02-12 | Disposition: A | Discharge status: Home / Self Care | Payer: Medicare Other | Source: Ambulatory Visit | Attending: Internal Medicine | Admitting: Internal Medicine

## 2022-02-12 VITALS — Wt 270.1 lb

## 2022-02-12 DIAGNOSIS — J449 Chronic obstructive pulmonary disease, unspecified: Secondary | ICD-10-CM | POA: Diagnosis not present

## 2022-02-12 NOTE — Progress Notes (Signed)
Daily Session Note  Patient Details  Name: Paula [name] MRN: [id number] Date of Birth: 1950/06/18 Referring Provider:   Flowsheet Row PULMONARY REHAB OTHER RESP ORIENTATION from 12/06/2021 in [place]  Referring Provider Dr. Georgann Housekeeper       Encounter Date: 02/12/2022  Check In:  Session Check In - 02/12/22 1040       Check-In   Supervising physician immediately available to respond to emergencies [facility] MD immediately available    Physician(s) Dr Domenic Polite    Location [facility]    Staff Present Leana Roe, BS, Exercise Physiologist;Roshaunda Starkey Hassell Done, RN, BSN;Phyllis Billingsley, RN    Virtual Visit No    Medication changes reported     No    Fall or balance concerns reported    No    Tobacco Cessation No Change    Warm-up and Cool-down Performed as group-led instruction    Resistance Training Performed Yes    VAD Patient? No    PAD/SET Patient? No      Pain Assessment   Currently in Pain? No/denies    Pain Score 0-No pain    Multiple Pain Sites No             Capillary Blood Glucose: No results found for this or any previous visit (from the past 24 hour(s)).    Social History   Tobacco Use  Smoking Status Former   Packs/day: 0.75   Years: 20.00   Total pack years: 15.00   Types: Cigarettes  Smokeless Tobacco Former   Quit date: 10/31/1998    Goals Met:  Proper associated with RPD/PD & O2 Sat Independence with exercise equipment Using PLB without cueing & demonstrates good technique Exercise tolerated well Queuing for purse lip breathing No report of concerns or symptoms today Strength training completed today  Goals Unmet:  Not Applicable  Comments: Checkout at 1145.   Dr. Kathie Dike is Medical Director for [facility].

## 2022-02-14 ENCOUNTER — Encounter (HOSPITAL_COMMUNITY): Payer: Medicare Other

## 2022-02-19 ENCOUNTER — Encounter (HOSPITAL_COMMUNITY)
Admission: RE | Admit: 2022-02-19 | Discharge: 2022-02-19 | Disposition: A | Discharge status: Home / Self Care | Payer: Medicare Other | Source: Ambulatory Visit | Attending: Internal Medicine | Admitting: Internal Medicine

## 2022-02-19 DIAGNOSIS — J449 Chronic obstructive pulmonary disease, unspecified: Secondary | ICD-10-CM

## 2022-02-19 NOTE — Progress Notes (Signed)
Daily Session Note  Patient Details  Name: Paula [name] MRN: [id number] Date of Birth: 07/26/1950 Referring Provider:   Flowsheet Row PULMONARY REHAB OTHER RESP ORIENTATION from 12/06/2021 in [place] [facility]  Referring Provider Dr. Mosher       Encounter Date: 02/19/2022  Check In:  Session Check In - 02/19/22 1045       Check-In   Supervising physician immediately available to respond to emergencies [facility] MD immediately available    Physician(s) Dr Mallipeddi    Location [facility]    Staff Present Heather Bailey, BS, Exercise Physiologist;Daphyne Martin, RN, BSN;Dalton Fletcher MHA, MS, ACSM-CEP    Virtual Visit No    Medication changes reported     No    Fall or balance concerns reported    No    Tobacco Cessation No Change    Warm-up and Cool-down Performed as group-led instruction    Resistance Training Performed Yes    VAD Patient? No    PAD/SET Patient? No      Pain Assessment   Currently in Pain? No/denies    Pain Score 0-No pain    Multiple Pain Sites No             Capillary Blood Glucose: No results found for this or any previous visit (from the past 24 hour(s)).    Social History   Tobacco Use  Smoking Status Former   Packs/day: 0.75   Years: 20.00   Total pack years: 15.00   Types: Cigarettes  Smokeless Tobacco Former   Quit date: 10/31/1998    Goals Met:  Proper associated with RPD/PD & O2 Sat Independence with exercise equipment Using PLB without cueing & demonstrates good technique Exercise tolerated well Queuing for purse lip breathing No report of concerns or symptoms today Strength training completed today  Goals Unmet:  Not Applicable  Comments: checkout at 1145.   Dr. Jehanzeb Memon is Medical Director for [facility]. 

## 2022-02-21 ENCOUNTER — Encounter (HOSPITAL_COMMUNITY)
Admission: RE | Admit: 2022-02-21 | Discharge: 2022-02-21 | Disposition: A | Discharge status: Home / Self Care | Payer: Medicare Other | Source: Ambulatory Visit | Attending: Internal Medicine | Admitting: Internal Medicine

## 2022-02-21 DIAGNOSIS — J449 Chronic obstructive pulmonary disease, unspecified: Secondary | ICD-10-CM

## 2022-02-21 NOTE — Progress Notes (Signed)
Daily Session Note  Patient Details  Name: Paula [name] MRN: [id number] Date of Birth: November 26, 1950 Referring Provider:   Flowsheet Row PULMONARY REHAB OTHER RESP ORIENTATION from 12/06/2021 in [place]  Referring Provider Dr. Georgann Housekeeper       Encounter Date: 02/21/2022  Check In:  Session Check In - 02/21/22 1045       Check-In   Supervising physician immediately available to respond to emergencies [facility] MD immediately available    Physician(s) Dr Dellia Cloud    Location [facility]    Staff Present Leana Roe, BS, Exercise Physiologist;Dakota Stangl Hassell Done, RN, BSN    Virtual Visit No    Medication changes reported     No    Fall or balance concerns reported    No    Tobacco Cessation No Change    Warm-up and Cool-down Performed as group-led instruction    Resistance Training Performed Yes    VAD Patient? No    PAD/SET Patient? No      Pain Assessment   Currently in Pain? No/denies    Pain Score 0-No pain    Multiple Pain Sites No             Capillary Blood Glucose: No results found for this or any previous visit (from the past 24 hour(s)).    Social History   Tobacco Use  Smoking Status Former   Packs/day: 0.75   Years: 20.00   Total pack years: 15.00   Types: Cigarettes  Smokeless Tobacco Former   Quit date: 10/31/1998    Goals Met:  Proper associated with RPD/PD & O2 Sat Independence with exercise equipment Using PLB without cueing & demonstrates good technique Exercise tolerated well Queuing for purse lip breathing No report of concerns or symptoms today Strength training completed today  Goals Unmet:  Not Applicable  Comments: Checkout at 1145.   Dr. Kathie Dike is Medical Director for [facility].

## 2022-02-26 ENCOUNTER — Encounter (HOSPITAL_COMMUNITY)
Admission: RE | Admit: 2022-02-26 | Discharge: 2022-02-26 | Disposition: A | Discharge status: Home / Self Care | Payer: Medicare Other | Source: Ambulatory Visit | Attending: Internal Medicine | Admitting: Internal Medicine

## 2022-02-26 VITALS — Wt 268.7 lb

## 2022-02-26 DIAGNOSIS — J449 Chronic obstructive pulmonary disease, unspecified: Secondary | ICD-10-CM | POA: Insufficient documentation

## 2022-02-26 NOTE — Progress Notes (Signed)
Daily Session Note  Patient Details  Name: Paula [name] MRN: [id number] Date of Birth: 1950/09/27 Referring Provider:   Flowsheet Row PULMONARY REHAB OTHER RESP ORIENTATION from 12/06/2021 in [place]  Referring Provider Dr. Georgann Housekeeper       Encounter Date: 02/26/2022  Check In:  Session Check In - 02/26/22 1045       Check-In   Supervising physician immediately available to respond to emergencies [facility] MD immediately available    Physician(s) Dr Harl Bowie    Location [facility]    Staff Present Leana Roe, BS, Exercise Physiologist;Tritia Endo Hassell Done, RN, BSN    Virtual Visit No    Medication changes reported     No    Fall or balance concerns reported    No    Tobacco Cessation No Change    Warm-up and Cool-down Performed as group-led instruction    Resistance Training Performed Yes    VAD Patient? No    PAD/SET Patient? No      Pain Assessment   Currently in Pain? No/denies    Pain Score 0-No pain    Multiple Pain Sites No             Capillary Blood Glucose: No results found for this or any previous visit (from the past 24 hour(s)).    Social History   Tobacco Use  Smoking Status Former   Packs/day: 0.75   Years: 20.00   Total pack years: 15.00   Types: Cigarettes  Smokeless Tobacco Former   Quit date: 10/31/1998    Goals Met:  Proper associated with RPD/PD & O2 Sat Independence with exercise equipment Using PLB without cueing & demonstrates good technique Exercise tolerated well Queuing for purse lip breathing No report of concerns or symptoms today Strength training completed today  Goals Unmet:  Not Applicable  Comments: Checkout at 1145.   Dr. Kathie Dike is Medical Director for [facility] Pulmonary Re[facility].

## 2022-02-27 NOTE — Progress Notes (Signed)
Pulmonary Individual Treatment Plan  Patient Details  Name: PHILICIA HEYNE MRN: [id number] Date of Birth: 1950/04/15 Referring Provider:   [facility] from 12/06/2021 in [place]  Referring Provider Dr. Georgann Housekeeper       Initial Encounter Date:  Flowsheet Row PULMONARY REHAB OTHER RESP ORIENTATION from 12/06/2021 in [place]  Date 12/06/21       Visit Diagnosis: Chronic obstructive pulmonary disease, unspecified COPD type ([place])  Patient's Home Medications on Admission:   Current Outpatient Medications:    acetaminophen (TYLENOL) 500 MG tablet, Take 500-1,000 mg by mouth every 6 (six) hours as needed (pain.)., Disp: , Rfl:    albuterol (VENTOLIN HFA) 108 (90 Base) MCG/ACT inhaler, Inhale 2 puffs into the lungs every 6 (six) hours as needed for wheezing or shortness of breath., Disp: , Rfl:    azelastine (ASTELIN) 0.1 % nasal spray, Place 2 sprays into both nostrils 2 (two) times daily as needed for allergies. Use in each nostril as directed, Disp: , Rfl:    budesonide-formoterol (SYMBICORT) 160-4.5 MCG/ACT inhaler, Inhale 2 puffs into the lungs 2 (two) times daily., Disp: , Rfl:    Cholecalciferol (VITAMIN D) 125 MCG (5000 UT) CAPS, Take 5,000 Units by mouth in the morning., Disp: , Rfl:    clotrimazole-betamethasone (LOTRISONE) cream, Apply 1 application topically 2 (two) times daily as needed (skin irritation.). , Disp: , Rfl:    diclofenac (VOLTAREN) 75 MG EC tablet, Take 75 mg by mouth daily as needed (arthritis pain)., Disp: , Rfl:    ezetimibe (ZETIA) 10 MG tablet, Take 10 mg by mouth in the morning., Disp: , Rfl:    famotidine (PEPCID) 40 MG tablet, Take 40 mg by mouth at bedtime., Disp: , Rfl:    fluticasone (FLONASE) 50 MCG/ACT nasal spray, Place 2 sprays into both nostrils daily as needed for allergies., Disp: , Rfl:    guaiFENesin (MUCINEX) 600 MG 12 hr tablet, Take 600 mg by mouth daily as  needed (congestion/COPD issues)., Disp: , Rfl:    hydrochlorothiazide (HYDRODIURIL) 25 MG tablet, Take 25 mg by mouth in the morning., Disp: , Rfl:    Ketotifen Fumarate (ALLERGY EYE DROPS OP), Place 1 drop into both eyes 2 (two) times daily as needed (allergy eyes)., Disp: , Rfl:    loratadine (CLARITIN) 10 MG tablet, Take 10 mg by mouth daily as needed for allergies., Disp: , Rfl:    losartan (COZAAR) 100 MG tablet, Take 100 mg by mouth in the morning., Disp: , Rfl:    montelukast (SINGULAIR) 10 MG tablet, Take 10 mg by mouth at bedtime., Disp: , Rfl:    Multiple Vitamins-Minerals (PRESERVISION AREDS 2 PO), Take 1 tablet by mouth in the morning and at bedtime., Disp: , Rfl:    NON FORMULARY, CPAP, O2 at night, Disp: , Rfl:    omeprazole (PRILOSEC) 40 MG capsule, Take 40 mg by mouth daily before breakfast., Disp: , Rfl:    sertraline (ZOLOFT) 100 MG tablet, Take 100 mg by mouth at bedtime., Disp: , Rfl:    terbinafine (LAMISIL) 250 MG tablet, Take 250 mg by mouth in the morning., Disp: , Rfl:   Past Medical History: Past Medical History:  Diagnosis Date   Anxiety    Arthritis    COPD (chronic obstructive pulmonary disease) ([place])    Heart murmur    Hypertension    Lung nodule    LLL   Sleep apnea    CPAP  with O2 2 liters    Tubal ectopic pregnancy 1970   Vitelliform macular dystrophy     Tobacco Use: Social History   Tobacco Use  Smoking Status Former   Packs/day: 0.75   Years: 20.00   Total pack years: 15.00   Types: Cigarettes  Smokeless Tobacco Former   Quit date: 10/31/1998    Labs: Review Flowsheet        No data to display          Capillary Blood Glucose: No results found for: "GLUCAP"   Pulmonary Assessment Scores:  Pulmonary Assessment Scores     Row Name 12/06/21 1250         ADL UCSD   ADL Phase Entry     SOB Score total 33     Rest 0     Walk 3     Stairs 4     Bath 0     Dress 1     Shop 2       CAT Score   CAT Score 11       mMRC  Score   mMRC Score 2             UCSD: Self-administered rating of dyspnea associated with activities of daily living (ADLs) 6-point scale (0 = "not at all" to 5 = "maximal or unable to do because of breathlessness")  Scoring Scores range from 0 to 120.  Minimally important difference is 5 units  CAT: CAT can identify the health impairment of COPD patients and is better correlated with disease progression.  CAT has a scoring range of zero to 40. The CAT score is classified into four groups of low (less than 10), medium (10 - 20), high (21-30) and very high (31-40) based on the impact level of disease on health status. A CAT score over 10 suggests significant symptoms.  A worsening CAT score could be explained by an exacerbation, poor medication adherence, poor inhaler technique, or progression of COPD or comorbid conditions.  CAT MCID is 2 points  mMRC: mMRC (Modified Medical Research Council) Dyspnea Scale is used to assess the degree of baseline functional disability in patients of respiratory disease due to dyspnea. No minimal important difference is established. A decrease in score of 1 point or greater is considered a positive change.   Pulmonary Function Assessment:   Exercise Target Goals: Exercise Program Goal: Individual exercise prescription set using results from initial 6 min walk test and THRR while considering  patient's activity barriers and safety.   Exercise Prescription Goal: Initial exercise prescription builds to 30-45 minutes a day of aerobic activity, 2-3 days per week.  Home exercise guidelines will be given to patient during program as part of exercise prescription that the participant will acknowledge.  Activity Barriers & Risk Stratification:  Activity Barriers & Cardiac Risk Stratification - 12/06/21 1313       Activity Barriers & Cardiac Risk Stratification   Activity Barriers Arthritis;Shortness of Breath    Cardiac Risk Stratification Low              6 Minute Walk:  6 Minute Walk     Row Name 12/06/21 1357         6 Minute Walk   Phase Initial     Distance 800 feet     Walk Time 6 minutes     # of Rest Breaks 2     MPH 1.51     METS 1.11  RPE 14     Perceived Dyspnea  14     VO2 Peak 3.89     Symptoms Yes (comment)     Comments two standing breaks due to SOB and bilateral hip pain (6/10) 20 & 30 seconds.     Resting HR 73 bpm     Resting BP 134/66     Resting Oxygen Saturation  94 %     Exercise Oxygen Saturation  during 6 min walk 87 %     Max Ex. HR 120 bpm     Max Ex. BP 160/66     2 Minute Post BP 140/66       Interval HR   1 Minute HR 100     2 Minute HR 103     3 Minute HR 103     4 Minute HR 113     5 Minute HR 120     6 Minute HR 115     2 Minute Post HR 76     Interval Heart Rate? Yes       Interval Oxygen   Interval Oxygen? Yes     Baseline Oxygen Saturation % 94 %     1 Minute Oxygen Saturation % 87 %     1 Minute Liters of Oxygen 0 L     2 Minute Oxygen Saturation % 88 %     2 Minute Liters of Oxygen 0 L     3 Minute Oxygen Saturation % 90 %     3 Minute Liters of Oxygen 0 L     4 Minute Oxygen Saturation % 87 %     4 Minute Liters of Oxygen 0 L     5 Minute Oxygen Saturation % 87 %     5 Minute Liters of Oxygen 0 L     6 Minute Oxygen Saturation % 87 %     6 Minute Liters of Oxygen 0 L     2 Minute Post Oxygen Saturation % 95 %     2 Minute Post Liters of Oxygen 0 L              Oxygen Initial Assessment:  Oxygen Initial Assessment - 12/06/21 1249       Home Oxygen   Home Oxygen Device Home Concentrator    Sleep Oxygen Prescription Continuous;CPAP    Liters per minute 2    Home Exercise Oxygen Prescription None    Home Resting Oxygen Prescription None    Compliance with Home Oxygen Use Yes      Initial 6 min Walk   Oxygen Used None      Program Oxygen Prescription   Program Oxygen Prescription None      Intervention   Short Term Goals To learn and exhibit  compliance with exercise, home and travel O2 prescription;To learn and understand importance of monitoring SPO2 with pulse oximeter and demonstrate accurate use of the pulse oximeter.;To learn and understand importance of maintaining oxygen saturations>88%;To learn and demonstrate proper pursed lip breathing techniques or other breathing techniques.     Long  Term Goals Exhibits compliance with exercise, home  and travel O2 prescription;Verbalizes importance of monitoring SPO2 with pulse oximeter and return demonstration;Maintenance of O2 saturations>88%;Exhibits proper breathing techniques, such as pursed lip breathing or other method taught during program session             Oxygen Re-Evaluation:  Oxygen Re-Evaluation     Row Name 01/01/22 1344 01/29/22 1334 02/26/22  [place]   Program Oxygen Prescription None None None       Home Oxygen   Home Oxygen Device Home Concentrator Home Concentrator Home Concentrator     Sleep Oxygen Prescription Continuous;CPAP Continuous;CPAP Continuous;CPAP     Liters per minute '2 2 2     '$ Home Exercise Oxygen Prescription None None None     Home Resting Oxygen Prescription None None None     Compliance with Home Oxygen Use Yes Yes Yes       Goals/Expected Outcomes   Short Term Goals To learn and exhibit compliance with exercise, home and travel O2 prescription;To learn and understand importance of monitoring SPO2 with pulse oximeter and demonstrate accurate use of the pulse oximeter.;To learn and understand importance of maintaining oxygen saturations>88%;To learn and demonstrate proper pursed lip breathing techniques or other breathing techniques.  To learn and exhibit compliance with exercise, home and travel O2 prescription;To learn and understand importance of monitoring SPO2 with pulse oximeter and demonstrate accurate use of the pulse oximeter.;To learn and understand importance of maintaining oxygen saturations>88%;To  learn and demonstrate proper pursed lip breathing techniques or other breathing techniques.  To learn and exhibit compliance with exercise, home and travel O2 prescription;To learn and understand importance of monitoring SPO2 with pulse oximeter and demonstrate accurate use of the pulse oximeter.;To learn and understand importance of maintaining oxygen saturations>88%;To learn and demonstrate proper pursed lip breathing techniques or other breathing techniques.      Long  Term Goals Exhibits compliance with exercise, home  and travel O2 prescription;Verbalizes importance of monitoring SPO2 with pulse oximeter and return demonstration;Maintenance of O2 saturations>88%;Exhibits proper breathing techniques, such as pursed lip breathing or other method taught during program session Exhibits compliance with exercise, home  and travel O2 prescription;Verbalizes importance of monitoring SPO2 with pulse oximeter and return demonstration;Maintenance of O2 saturations>88%;Exhibits proper breathing techniques, such as pursed lip breathing or other method taught during program session Exhibits compliance with exercise, home  and travel O2 prescription;Verbalizes importance of monitoring SPO2 with pulse oximeter and return demonstration;Maintenance of O2 saturations>88%;Exhibits proper breathing techniques, such as pursed lip breathing or other method taught during program session     Goals/Expected Outcomes compliance compliance compliance              Oxygen Discharge (Final Oxygen Re-Evaluation):  Oxygen Re-Evaluation - 02/26/22 1303       Program Oxygen Prescription   Program Oxygen Prescription None      Home Oxygen   Home Oxygen Device Home Concentrator    Sleep Oxygen Prescription Continuous;CPAP    Liters per minute 2    Home Exercise Oxygen Prescription None    Home Resting Oxygen Prescription None    Compliance with Home Oxygen Use Yes      Goals/Expected Outcomes   Short Term Goals To learn  and exhibit compliance with exercise, home and travel O2 prescription;To learn and understand importance of monitoring SPO2 with pulse oximeter and demonstrate accurate use of the pulse oximeter.;To learn and understand importance of maintaining oxygen saturations>88%;To learn and demonstrate proper pursed lip breathing techniques or other breathing techniques.     Long  Term Goals Exhibits compliance with exercise, home  and travel O2 prescription;Verbalizes importance of monitoring SPO2 with pulse oximeter and return demonstration;Maintenance of O2 saturations>88%;Exhibits proper breathing techniques, such as pursed lip breathing or other method taught during program session    Goals/Expected Outcomes compliance  Initial Exercise Prescription:  Initial Exercise Prescription - 12/06/21 1400       Date of Initial Exercise RX and Referring Provider   Date 12/06/21    Referring Provider Dr. Georgann Housekeeper    Expected Discharge Date 04/11/22      NuStep   Level 1    SPM 60    Minutes 22      Arm Ergometer   Level 1    RPM 45    Minutes 17      Prescription Details   Frequency (times per week) 2    Duration Progress to 30 minutes of continuous aerobic without signs/symptoms of physical distress      Intensity   THRR 40-80% of Max Heartrate 52-118    Ratings of Perceived Exertion 11-13    Perceived Dyspnea 0-4      Resistance Training   Training Prescription Yes    Weight 2    Reps 10-15             Perform Capillary Blood Glucose checks as needed.  Exercise Prescription Changes:   Exercise Prescription Changes     Row Name 12/20/21 1200 01/01/22 1300 01/15/22 1200 01/29/22 1300 02/12/22 1200     Response to Exercise   Blood Pressure (Admit) 132/76 128/60 144/64 128/72 122/72   Blood Pressure (Exercise) 160/78 142/70 160/70 160/72 140/76   Blood Pressure (Exit) 118/64 126/64 124/72 138/90 160/80   Heart Rate (Admit) 70 bpm 76 bpm 82 bpm 95 bpm 83 bpm    Heart Rate (Exercise) 79 bpm 85 bpm 90 bpm 95 bpm 90 bpm   Heart Rate (Exit) 64 bpm 70 bpm 72 bpm 83 bpm 82 bpm   Oxygen Saturation (Admit) 94 % 95 % 95 % 93 % 94 %   Oxygen Saturation (Exercise) 93 % 92 % 94 % 92 % 93 %   Oxygen Saturation (Exit) 95 % 95 % 94 % 95 % 96 %   Rating of Perceived Exertion (Exercise) '12 12 12 12 12   '$ Perceived Dyspnea (Exercise) '12 12 12 12 12   '$ Duration Continue with 30 min of aerobic exercise without signs/symptoms of physical distress. Continue with 30 min of aerobic exercise without signs/symptoms of physical distress. Continue with 30 min of aerobic exercise without signs/symptoms of physical distress. Continue with 30 min of aerobic exercise without signs/symptoms of physical distress. Continue with 30 min of aerobic exercise without signs/symptoms of physical distress.   Intensity THRR unchanged THRR unchanged THRR unchanged THRR unchanged THRR unchanged     Progression   Progression Continue to progress workloads to maintain intensity without signs/symptoms of physical distress. Continue to progress workloads to maintain intensity without signs/symptoms of physical distress. Continue to progress workloads to maintain intensity without signs/symptoms of physical distress. Continue to progress workloads to maintain intensity without signs/symptoms of physical distress. Continue to progress workloads to maintain intensity without signs/symptoms of physical distress.     Resistance Training   Training Prescription Yes Yes Yes Yes Yes   Weight '2 3 3 3 3   '$ Reps 10-15 10-15 10-15 10-15 10-15   Time 10 Minutes 10 Minutes 10 Minutes 10 Minutes 10 Minutes     NuStep   Level '1 2 2 2 2   '$ SPM 91 102 107 106 109   Minutes '22 22 22 22 22   '$ METs 2 2 2.1 1.9 1.9     Arm Ergometer   Level '1 1 2 2 2   '$ RPM 64  70 66 69 64   Minutes '17 17 17 17 17   '$ METs 1.6 1.7 2.1 2.2 2    Row Name 02/26/22 1200             Response to Exercise   Blood Pressure (Admit) 132/78        Blood Pressure (Exercise) 148/84       Blood Pressure (Exit) 138/70       Heart Rate (Admit) 82 bpm       Heart Rate (Exercise) 89 bpm       Heart Rate (Exit) 79 bpm       Oxygen Saturation (Admit) 94 %       Oxygen Saturation (Exercise) 93 %       Oxygen Saturation (Exit) 94 %       Rating of Perceived Exertion (Exercise) 13       Perceived Dyspnea (Exercise) 12       Duration Continue with 30 min of aerobic exercise without signs/symptoms of physical distress.       Intensity THRR unchanged         Progression   Progression Continue to progress workloads to maintain intensity without signs/symptoms of physical distress.         Resistance Training   Training Prescription Yes       Weight 3       Reps 10-15       Time 10 Minutes         NuStep   Level 2       SPM 114       Minutes 22       METs 2         Arm Ergometer   Level 2       RPM 68       Minutes 17       METs 2.1                Exercise Comments:   Exercise Goals and Review:   Exercise Goals     Row Name 12/06/21 1402 01/01/22 1342 01/29/22 1328 02/26/22 1259       Exercise Goals   Increase Physical Activity Yes Yes Yes Yes    Intervention Provide advice, education, support and counseling about physical activity/exercise needs.;Develop an individualized exercise prescription for aerobic and resistive training based on initial evaluation findings, risk stratification, comorbidities and participant's personal goals. Provide advice, education, support and counseling about physical activity/exercise needs.;Develop an individualized exercise prescription for aerobic and resistive training based on initial evaluation findings, risk stratification, comorbidities and participant's personal goals. Provide advice, education, support and counseling about physical activity/exercise needs.;Develop an individualized exercise prescription for aerobic and resistive training based on initial evaluation findings, risk  stratification, comorbidities and participant's personal goals. Provide advice, education, support and counseling about physical activity/exercise needs.;Develop an individualized exercise prescription for aerobic and resistive training based on initial evaluation findings, risk stratification, comorbidities and participant's personal goals.    Expected Outcomes Short Term: Attend rehab on a regular basis to increase amount of physical activity.;Long Term: Add in home exercise to make exercise part of routine and to increase amount of physical activity.;Long Term: Exercising regularly at least 3-5 days a week. Short Term: Attend rehab on a regular basis to increase amount of physical activity.;Long Term: Add in home exercise to make exercise part of routine and to increase amount of physical activity.;Long Term: Exercising regularly at least 3-5 days a week. Short Term:  Attend rehab on a regular basis to increase amount of physical activity.;Long Term: Add in home exercise to make exercise part of routine and to increase amount of physical activity.;Long Term: Exercising regularly at least 3-5 days a week. Short Term: Attend rehab on a regular basis to increase amount of physical activity.;Long Term: Add in home exercise to make exercise part of routine and to increase amount of physical activity.;Long Term: Exercising regularly at least 3-5 days a week.    Increase Strength and Stamina Yes Yes Yes Yes    Intervention Provide advice, education, support and counseling about physical activity/exercise needs.;Develop an individualized exercise prescription for aerobic and resistive training based on initial evaluation findings, risk stratification, comorbidities and participant's personal goals. Provide advice, education, support and counseling about physical activity/exercise needs.;Develop an individualized exercise prescription for aerobic and resistive training based on initial evaluation findings, risk  stratification, comorbidities and participant's personal goals. Provide advice, education, support and counseling about physical activity/exercise needs.;Develop an individualized exercise prescription for aerobic and resistive training based on initial evaluation findings, risk stratification, comorbidities and participant's personal goals. Provide advice, education, support and counseling about physical activity/exercise needs.;Develop an individualized exercise prescription for aerobic and resistive training based on initial evaluation findings, risk stratification, comorbidities and participant's personal goals.    Expected Outcomes Short Term: Increase workloads from initial exercise prescription for resistance, speed, and METs.;Short Term: Perform resistance training exercises routinely during rehab and add in resistance training at home;Long Term: Improve cardiorespiratory fitness, muscular endurance and strength as measured by increased METs and functional capacity (6MWT) Short Term: Increase workloads from initial exercise prescription for resistance, speed, and METs.;Short Term: Perform resistance training exercises routinely during rehab and add in resistance training at home;Long Term: Improve cardiorespiratory fitness, muscular endurance and strength as measured by increased METs and functional capacity (6MWT) Short Term: Increase workloads from initial exercise prescription for resistance, speed, and METs.;Short Term: Perform resistance training exercises routinely during rehab and add in resistance training at home;Long Term: Improve cardiorespiratory fitness, muscular endurance and strength as measured by increased METs and functional capacity (6MWT) Short Term: Increase workloads from initial exercise prescription for resistance, speed, and METs.;Short Term: Perform resistance training exercises routinely during rehab and add in resistance training at home;Long Term: Improve cardiorespiratory  fitness, muscular endurance and strength as measured by increased METs and functional capacity (6MWT)    Able to understand and use rate of perceived exertion (RPE) scale Yes Yes Yes Yes    Intervention Provide education and explanation on how to use RPE scale Provide education and explanation on how to use RPE scale Provide education and explanation on how to use RPE scale Provide education and explanation on how to use RPE scale    Expected Outcomes Short Term: Able to use RPE daily in rehab to express subjective intensity level;Long Term:  Able to use RPE to guide intensity level when exercising independently Short Term: Able to use RPE daily in rehab to express subjective intensity level;Long Term:  Able to use RPE to guide intensity level when exercising independently Short Term: Able to use RPE daily in rehab to express subjective intensity level;Long Term:  Able to use RPE to guide intensity level when exercising independently Short Term: Able to use RPE daily in rehab to express subjective intensity level;Long Term:  Able to use RPE to guide intensity level when exercising independently    Able to understand and use Dyspnea scale Yes Yes Yes Yes    Intervention Provide  education and explanation on how to use Dyspnea scale Provide education and explanation on how to use Dyspnea scale Provide education and explanation on how to use Dyspnea scale Provide education and explanation on how to use Dyspnea scale    Expected Outcomes Short Term: Able to use Dyspnea scale daily in rehab to express subjective sense of shortness of breath during exertion;Long Term: Able to use Dyspnea scale to guide intensity level when exercising independently Short Term: Able to use Dyspnea scale daily in rehab to express subjective sense of shortness of breath during exertion;Long Term: Able to use Dyspnea scale to guide intensity level when exercising independently Short Term: Able to use Dyspnea scale daily in rehab to  express subjective sense of shortness of breath during exertion;Long Term: Able to use Dyspnea scale to guide intensity level when exercising independently Short Term: Able to use Dyspnea scale daily in rehab to express subjective sense of shortness of breath during exertion;Long Term: Able to use Dyspnea scale to guide intensity level when exercising independently    Knowledge and understanding of Target Heart Rate Range (THRR) Yes Yes Yes Yes    Intervention Provide education and explanation of THRR including how the numbers were predicted and where they are located for reference Provide education and explanation of THRR including how the numbers were predicted and where they are located for reference Provide education and explanation of THRR including how the numbers were predicted and where they are located for reference Provide education and explanation of THRR including how the numbers were predicted and where they are located for reference    Expected Outcomes Short Term: Able to state/look up THRR;Short Term: Able to use daily as guideline for intensity in rehab;Long Term: Able to use THRR to govern intensity when exercising independently Short Term: Able to state/look up THRR;Short Term: Able to use daily as guideline for intensity in rehab;Long Term: Able to use THRR to govern intensity when exercising independently Short Term: Able to state/look up THRR;Short Term: Able to use daily as guideline for intensity in rehab;Long Term: Able to use THRR to govern intensity when exercising independently Short Term: Able to state/look up THRR;Short Term: Able to use daily as guideline for intensity in rehab;Long Term: Able to use THRR to govern intensity when exercising independently    Able to check pulse independently Yes Yes -- --    Intervention Provide education and demonstration on how to check pulse in carotid and radial arteries.;Review the importance of being able to check your own pulse for safety  during independent exercise Provide education and demonstration on how to check pulse in carotid and radial arteries.;Review the importance of being able to check your own pulse for safety during independent exercise Provide education and demonstration on how to check pulse in carotid and radial arteries.;Review the importance of being able to check your own pulse for safety during independent exercise --    Expected Outcomes Short Term: Able to explain why pulse checking is important during independent exercise;Long Term: Able to check pulse independently and accurately Short Term: Able to explain why pulse checking is important during independent exercise;Long Term: Able to check pulse independently and accurately Short Term: Able to explain why pulse checking is important during independent exercise;Long Term: Able to check pulse independently and accurately --    Understanding of Exercise Prescription Yes Yes Yes Yes    Intervention Provide education, explanation, and written materials on patient's individual exercise prescription Provide education, explanation, and written materials  on patient's individual exercise prescription Provide education, explanation, and written materials on patient's individual exercise prescription Provide education, explanation, and written materials on patient's individual exercise prescription    Expected Outcomes Short Term: Able to explain program exercise prescription;Long Term: Able to explain home exercise prescription to exercise independently Short Term: Able to explain program exercise prescription;Long Term: Able to explain home exercise prescription to exercise independently Short Term: Able to explain program exercise prescription;Long Term: Able to explain home exercise prescription to exercise independently Short Term: Able to explain program exercise prescription;Long Term: Able to explain home exercise prescription to exercise independently              Exercise Goals Re-Evaluation :  Exercise Goals Re-Evaluation     Row Name 01/01/22 1342 01/29/22 1329 01/30/22 0731 02/26/22 1300       Exercise Goal Re-Evaluation   Exercise Goals Review Increase Physical Activity;Increase Strength and Stamina;Able to understand and use rate of perceived exertion (RPE) scale;Able to understand and use Dyspnea scale;Knowledge and understanding of Target Heart Rate Range (THRR);Understanding of Exercise Prescription Increase Physical Activity;Increase Strength and Stamina;Able to understand and use rate of perceived exertion (RPE) scale;Able to understand and use Dyspnea scale;Knowledge and understanding of Target Heart Rate Range (THRR);Understanding of Exercise Prescription -- Increase Physical Activity;Increase Strength and Stamina;Able to understand and use rate of perceived exertion (RPE) scale;Able to understand and use Dyspnea scale;Knowledge and understanding of Target Heart Rate Range (THRR);Understanding of Exercise Prescription    Comments Pt has completed 7 sessions of PR. She is motivated during class and has increased her levels on the stepper. She has increased her weights and band for the warm up. She is currently exercising at 2.0 METs on the stepper. Will continue to monitor and progress as able. Pt has completed 13 sessions of PR. She continues to increase her workloads on both pieces of equipment. She enjous coming to class each week. She is currently exercising at 2.2 METs on the AE. Will continue to monitor and progress as able. -- Pt has completed19 sessions of PR. She continues to enjoy coming to class and is pushes herself during class. She has not increased her level but does improve on her SPM/RPM each class. Pt is currently exercising at 2.1 METs on the AE. Will continue to monitor and progress as able.    Expected Outcomes Through exercise at home and at rehab, the patient will meet their stated goals. Through exercise at home and at rehab,  the patient will meet their stated goals. Through exercise at home and at rehab, the patient will meet their stated goals. Through exercise at home and at rehab, the patient will meet their stated goals.             Discharge Exercise Prescription (Final Exercise Prescription Changes):  Exercise Prescription Changes - 02/26/22 1200       Response to Exercise   Blood Pressure (Admit) 132/78    Blood Pressure (Exercise) 148/84    Blood Pressure (Exit) 138/70    Heart Rate (Admit) 82 bpm    Heart Rate (Exercise) 89 bpm    Heart Rate (Exit) 79 bpm    Oxygen Saturation (Admit) 94 %    Oxygen Saturation (Exercise) 93 %    Oxygen Saturation (Exit) 94 %    Rating of Perceived Exertion (Exercise) 13    Perceived Dyspnea (Exercise) 12    Duration Continue with 30 min of aerobic exercise without signs/symptoms of physical distress.  Intensity THRR unchanged      Progression   Progression Continue to progress workloads to maintain intensity without signs/symptoms of physical distress.      Resistance Training   Training Prescription Yes    Weight 3    Reps 10-15    Time 10 Minutes      NuStep   Level 2    SPM 114    Minutes 22    METs 2      Arm Ergometer   Level 2    RPM 68    Minutes 17    METs 2.1             Nutrition:  Target Goals: Understanding of nutrition guidelines, daily intake of sodium '1500mg'$ , cholesterol '200mg'$ , calories 30% from fat and 7% or less from saturated fats, daily to have 5 or more servings of fruits and vegetables.  Biometrics:  Pre Biometrics - 12/06/21 1402       Pre Biometrics   Height 5' (1.524 m)    Weight 119.8 kg    Waist Circumference 54 inches    Hip Circumference 58 inches    Waist to Hip Ratio 0.93 %    BMI (Calculated) 51.58    Triceps Skinfold 45 mm    % Body Fat 63.3 %    Grip Strength 12.3 kg    Flexibility 0 in    Single Leg Stand 0 seconds              Nutrition Therapy Plan and Nutrition Goals:   Nutrition Therapy & Goals - 12/06/21 1246       Personal Nutrition Goals   Comments Patient scored 52 on her diet assessment. Handout provided and explained regarding healthier choices. We offer 2 educational sessions on heart healthy nutrition with handouts and assistance with RD referral if patient is interested.      Intervention Plan   Intervention Nutrition handout(s) given to patient.    Expected Outcomes Short Term Goal: Understand basic principles of dietary content, such as calories, fat, sodium, cholesterol and nutrients.             Nutrition Assessments:  Nutrition Assessments - 12/06/21 1246       MEDFICTS Scores   Pre Score 52            MEDIFICTS Score Key: ?70 Need to make dietary changes  40-70 Heart Healthy Diet ? 40 Therapeutic Level Cholesterol Diet   Picture Your Plate Scores: <96 Unhealthy dietary pattern with much room for improvement. 41-50 Dietary pattern unlikely to meet recommendations for good health and room for improvement. 51-60 More healthful dietary pattern, with some room for improvement.  >60 Healthy dietary pattern, although there may be some specific behaviors that could be improved.    Nutrition Goals Re-Evaluation:   Nutrition Goals Discharge (Final Nutrition Goals Re-Evaluation):   Psychosocial: Target Goals: Acknowledge presence or absence of significant depression and/or stress, maximize coping skills, provide positive support system. Participant is able to verbalize types and ability to use techniques and skills needed for reducing stress and depression.  Initial Review & Psychosocial Screening:  Initial Psych Review & Screening - 12/06/21 1348       Initial Review   Current issues with Current Psychotropic Meds;Current Depression;Current Sleep Concerns      Family Dynamics   Good Support System? Yes      Barriers   Psychosocial barriers to participate in program There are no identifiable barriers or psychosocial  needs.  Screening Interventions   Interventions Encouraged to exercise;Provide feedback about the scores to participant    Expected Outcomes Short Term goal: Identification and review with participant of any Quality of Life or Depression concerns found by scoring the questionnaire.             Quality of Life Scores:  Quality of Life - 12/06/21 1403       Quality of Life   Select Quality of Life      Quality of Life Scores   Health/Function Pre 11.63 %    Socioeconomic Pre 17.14 %    Psych/Spiritual Pre 14.93 %    Family Pre 21 %    GLOBAL Pre 14.54 %            Scores of 19 and below usually indicate a poorer quality of life in these areas.  A difference of  2-3 points is a clinically meaningful difference.  A difference of 2-3 points in the total score of the Quality of Life Index has been associated with significant improvement in overall quality of life, self-image, physical symptoms, and general health in studies assessing change in quality of life.   PHQ-9: Review Flowsheet       12/06/2021  Depression screen PHQ 2/9  Decreased Interest 2  Down, Depressed, Hopeless 0  PHQ - 2 Score 2  Altered sleeping 1  Tired, decreased energy 2  Change in appetite 2  Feeling bad or failure about yourself  0  Trouble concentrating 0  Moving slowly or fidgety/restless 0  Suicidal thoughts 0  PHQ-9 Score 7  Difficult doing work/chores Somewhat difficult   Interpretation of Total Score  Total Score Depression Severity:  1-4 = Minimal depression, 5-9 = Mild depression, 10-14 = Moderate depression, 15-19 = Moderately severe depression, 20-27 = Severe depression   Psychosocial Evaluation and Intervention:  Psychosocial Evaluation - 12/06/21 1349       Psychosocial Evaluation & Interventions   Interventions Stress management education;Relaxation education;Encouraged to exercise with the program and follow exercise prescription    Comments Patient has no psychosocial  barriers identified to participate in PR at her orientation visit. Her PHQ-9 score was 7 due to her lack of energy and sleep concerns mainly. She says she started taking the Zoloft a few years ago when her COPD started preventing her from doing the things she wanted to do. She feels her depression is managed well. She lives with her husband of many years. They do not have children but she says she help raise 2 of her first cousins and their children as well. She names her husband and 2 cousins as her support people. She is a retired Licensed conveyancer. She says she used to be very active in her yard and garden and wants to be able to do these things again. She is ready to start the program hoping to get back to some of these activities.    Expected Outcomes Patient's depression will continue to be managed and she will continue to have no psychosocial barriers identified.    Continue Psychosocial Services  No Follow up required             Psychosocial Re-Evaluation:  Psychosocial Re-Evaluation     [place] Name 12/26/21 1248 01/22/22 0909 02/19/22 0811         Psychosocial Re-Evaluation   Current issues with -- -- Current Depression;Current Sleep Concerns;Current Psychotropic Meds     Comments Chevella is doing well in her first sessions in  PR.  Pt is working towards being able to work in her yard and increased energy for [employer].  Well supported by her husband.  Pts depression is mamnaged will with Zoloft. Pt is working towards being able to work in her yard and increased energy for [employer].  Well supported by her husband.  Pts depression is mamnaged will with Zoloft.  Lada seems to be enjoying the program and socializing with with class members and staff.  Patient has no current psychosocial issues currently. Well supported by her husband.  Pts depression is managed with Zoloft.  Nai seems to be enjoying the program and socializing with class members and staff.  Pt has gained 5 pounds since first visit,  however she will be provided with healthy eating habbits booklet today 02/19/22, to help her reach her goal of weight reduction  Patient has no current psychosocial issues currently.     Expected Outcomes Pt will complete the progra, meeting both program and personal goals. Pt will complete the program, meeting both program and personal goals. Pt will complete the program, meeting both program and personal goals.     Interventions Relaxation education;Encouraged to attend Pulmonary Rehabilitation for the exercise;Stress management education Relaxation education;Encouraged to attend Pulmonary Rehabilitation for the exercise;Stress management education Relaxation education;Encouraged to attend Pulmonary Rehabilitation for the exercise;Stress management education     Continue Psychosocial Services  No Follow up required No Follow up required No Follow up required              Psychosocial Discharge (Final Psychosocial Re-Evaluation):  Psychosocial Re-Evaluation - 02/19/22 0811       Psychosocial Re-Evaluation   Current issues with Current Depression;Current Sleep Concerns;Current Psychotropic Meds    Comments Well supported by her husband.  Pts depression is managed with Zoloft.  Bricelyn seems to be enjoying the program and socializing with class members and staff.  Pt has gained 5 pounds since first visit, however she will be provided with healthy eating habbits booklet today 02/19/22, to help her reach her goal of weight reduction  Patient has no current psychosocial issues currently.    Expected Outcomes Pt will complete the program, meeting both program and personal goals.    Interventions Relaxation education;Encouraged to attend Pulmonary Rehabilitation for the exercise;Stress management education    Continue Psychosocial Services  No Follow up required              Education: Education Goals: Education classes will be provided on a weekly basis, covering required topics. Participant  will state understanding/return demonstration of topics presented.  Learning Barriers/Preferences:  Learning Barriers/Preferences - 12/06/21 1345       Learning Barriers/Preferences   Learning Barriers None    Learning Preferences Written Material;Skilled Demonstration             Education Topics: How Lungs Work and Diseases: - Discuss the anatomy of the lungs and diseases that can affect the lungs, such as COPD. Flowsheet Row PULMONARY REHAB OTHER RESPIRATORY from 02/21/2022 in [place]  Date 12/27/21  Educator DF  Instruction Review Code 1- Verbalizes Understanding       Exercise: -Discuss the importance of exercise, FITT principles of exercise, normal and abnormal responses to exercise, and how to exercise safely.   Environmental Irritants: -Discuss types of environmental irritants and how to limit exposure to environmental irritants.   Meds/Inhalers and oxygen: - Discuss respiratory medications, definition of an inhaler and oxygen, and the proper way to use an inhaler and  oxygen. Flowsheet Row PULMONARY REHAB OTHER RESPIRATORY from 02/21/2022 in [place]  Date 01/10/22  Educator HJ       Energy Saving Techniques: - Discuss methods to conserve energy and decrease shortness of breath when performing activities of daily living.  Flowsheet Row PULMONARY REHAB OTHER RESPIRATORY from 02/21/2022 in [place]  Date 01/17/22  Educator Handout  Instruction Review Code 1- Verbalizes Understanding       Bronchial Hygiene / Breathing Techniques: - Discuss breathing mechanics, pursed-lip breathing technique,  proper posture, effective ways to clear airways, and other functional breathing techniques Flowsheet Row PULMONARY REHAB OTHER RESPIRATORY from 02/21/2022 in [place]  Date 01/24/22  Educator HB  Instruction Review Code 1- Horticulturist, commercial: - Provides group verbal and written instruction about the health risks of elevated stress, cause of high stress, and healthy ways to reduce stress.   Nutrition I: Fats: - Discuss the types of cholesterol, what cholesterol does to the body, and how cholesterol levels can be controlled. Flowsheet Row PULMONARY REHAB OTHER RESPIRATORY from 02/21/2022 in [place]  Date 02/07/22  Educator handout  Instruction Review Code 1- Verbalizes Understanding       Nutrition II: Labels: -Discuss the different components of food labels and how to read food labels. Flowsheet Row PULMONARY REHAB OTHER RESPIRATORY from 02/21/2022 in [place]  Date 02/21/22  Educator HB  Instruction Review Code 1- Verbalizes Understanding       Respiratory Infections: - Discuss the signs and symptoms of respiratory infections, ways to prevent respiratory infections, and the importance of seeking medical treatment when having a respiratory infection.   Stress I: Signs and Symptoms: - Discuss the causes of stress, how stress may lead to anxiety and depression, and ways to limit stress.   Stress II: Relaxation: -Discuss relaxation techniques to limit stress. Flowsheet Row PULMONARY REHAB OTHER RESPIRATORY from 02/21/2022 in [place]  Date 12/13/21  Educator DM  Instruction Review Code 1- Verbalizes Understanding       Oxygen for Home/Travel: - Discuss how to prepare for travel when on oxygen and proper ways to transport and store oxygen to ensure safety. Flowsheet Row PULMONARY REHAB OTHER RESPIRATORY from 02/21/2022 in [place]  Date 12/20/21  Educator DF  Instruction Review Code 1- Verbalizes Understanding       Knowledge Questionnaire Score:  Knowledge Questionnaire Score - 12/06/21 1248       Knowledge Questionnaire Score   Pre Score 14/18             Core  Components/Risk Factors/Patient Goals at Admission:  Personal Goals and Risk Factors at Admission - 12/06/21 1345       Core Components/Risk Factors/Patient Goals on Admission    Weight Management Obesity;Yes    Intervention Weight Management: Provide education and appropriate resources to help participant work on and attain dietary goals.;Weight Management/Obesity: Establish reasonable short term and long term weight goals.;Obesity: Provide education and appropriate resources to help participant work on and attain dietary goals.    Admit Weight 264 lb 4.8 oz (119.9 kg)    Expected Outcomes Weight Gain: Understanding of general recommendations for a high calorie, high protein meal plan that promotes weight gain by distributing calorie intake throughout the day with the consumption for 4-5 meals, snacks, and/or supplements    Improve shortness of breath with ADL's Yes    Intervention  Provide education, individualized exercise plan and daily activity instruction to help decrease symptoms of SOB with activities of daily living.    Expected Outcomes Short Term: Improve cardiorespiratory fitness to achieve a reduction of symptoms when performing ADLs;Long Term: Be able to perform more ADLs without symptoms or delay the onset of symptoms    Personal Goal Other Yes    Personal Goal Patient wants to have more energy build muscles and lose weight.    Intervention Patient will attend PR 2 days/week with exercise and education and supplement with exercise at home.    Expected Outcomes Patient will complete the program meeting both personal and program goals.             Core Components/Risk Factors/Patient Goals Review:   Goals and Risk Factor Review     Row Name 12/26/21 1253 12/26/21 1254 01/22/22 0916 02/19/22 0823       Core Components/Risk Factors/Patient Goals Review   Personal Goals Review Weight Management/Obesity;Stress -- Weight Management/Obesity;Stress Weight Management/Obesity;Stress     Review -- has completed 5 session in PR.  Tolerating program well.  Exercises without O2 and Sats are running93-95% on room air.  Socializing well with staff and other clients.  Vital signs has been stable.  Pt has been maintaining her weight thus far. Tenea has completed 11 session in PR.  Tolerating program well.  Exercises without O2 and Sats are running 91-95% on room air.  Socializing well with staff and other clients.  Vital signs has been within guidelines.  Pt has been maintaining her weight thus far.  CT scan (chest) done on 01/04/2022, showing improvments from previous CT scan done in 2019. Ananda has completed 17 session in PR.  Tolerating program well.  Exercises without O2 and Sats are running 91-95% on room air.  Socializing well with staff and other clients.  Vital signs has been within guidelines.  Pt has been maintaining her weight thus far.  CT scan (chest) done on 01/04/2022, showing improvments from previous CT scan done in 2019.    Expected Outcomes -- Plan of care is ongoing.  No further concerns as of present. Plan of care is ongoing.  No further concerns as of present. Plan of care is ongoing.  No further concerns as of present.             Core Components/Risk Factors/Patient Goals at Discharge (Final Review):   Goals and Risk Factor Review - 02/19/22 0823       Core Components/Risk Factors/Patient Goals Review   Personal Goals Review Weight Management/Obesity;Stress    Review Leahann has completed 17 session in PR.  Tolerating program well.  Exercises without O2 and Sats are running 91-95% on room air.  Socializing well with staff and other clients.  Vital signs has been within guidelines.  Pt has been maintaining her weight thus far.  CT scan (chest) done on 01/04/2022, showing improvments from previous CT scan done in 2019.    Expected Outcomes Plan of care is ongoing.  No further concerns as of present.             ITP Comments:   Comments: ITP REVIEW Pt is  making expected progress toward pulmonary rehab goals after completing 20 sessions. Recommend continued exercise, life style modification, education, and utilization of breathing techniques to increase stamina and strength and decrease shortness of breath with exertion.

## 2022-02-28 ENCOUNTER — Encounter (HOSPITAL_COMMUNITY): Payer: Medicare Other

## 2022-03-05 ENCOUNTER — Encounter (HOSPITAL_COMMUNITY)
Admission: RE | Admit: 2022-03-05 | Discharge: 2022-03-05 | Disposition: A | Discharge status: Home / Self Care | Payer: Medicare Other | Source: Ambulatory Visit | Attending: Internal Medicine | Admitting: Internal Medicine

## 2022-03-05 DIAGNOSIS — J449 Chronic obstructive pulmonary disease, unspecified: Secondary | ICD-10-CM | POA: Diagnosis not present

## 2022-03-05 NOTE — Progress Notes (Signed)
Daily Session Note  Patient Details  Name: Paula [name] MRN: [id number] Date of Birth: 1950/04/30 Referring Provider:   Flowsheet Row PULMONARY REHAB OTHER RESP ORIENTATION from 12/06/2021 in [place]  Referring Provider Dr. Georgann Housekeeper       Encounter Date: 03/05/2022  Check In:  Session Check In - 03/05/22 1044       Check-In   Supervising physician immediately available to respond to emergencies [facility] MD immediately available    Physician(s) Dr Domenic Polite    Location [facility]    Staff Present Leana Roe, BS, Exercise Physiologist;Nezar Buckles Hassell Done, RN, BSN    Virtual Visit No    Medication changes reported     No    Fall or balance concerns reported    No    Tobacco Cessation No Change    Warm-up and Cool-down Performed as group-led instruction    Resistance Training Performed Yes    VAD Patient? No    PAD/SET Patient? No      Pain Assessment   Currently in Pain? No/denies    Pain Score 0-No pain    Multiple Pain Sites No             Capillary Blood Glucose: No results found for this or any previous visit (from the past 24 hour(s)).    Social History   Tobacco Use  Smoking Status Former   Packs/day: 0.75   Years: 20.00   Total pack years: 15.00   Types: Cigarettes  Smokeless Tobacco Former   Quit date: 10/31/1998    Goals Met:  Proper associated with RPD/PD & O2 Sat Independence with exercise equipment Using PLB without cueing & demonstrates good technique Exercise tolerated well Queuing for purse lip breathing No report of concerns or symptoms today Strength training completed today  Goals Unmet:  Not Applicable  Comments: Checkout at 1145.   Dr. Kathie Dike is Medical Director for [facility].

## 2022-03-07 ENCOUNTER — Encounter (HOSPITAL_COMMUNITY)
Admission: RE | Admit: 2022-03-07 | Discharge: 2022-03-07 | Disposition: A | Discharge status: Home / Self Care | Payer: Medicare Other | Source: Ambulatory Visit | Attending: Internal Medicine | Admitting: Internal Medicine

## 2022-03-07 DIAGNOSIS — J449 Chronic obstructive pulmonary disease, unspecified: Secondary | ICD-10-CM

## 2022-03-07 NOTE — Progress Notes (Signed)
Daily Session Note  Patient Details  Name: Paula [name] MRN: [id number] Date of Birth: 11/29/1950 Referring Provider:   Flowsheet Row PULMONARY REHAB OTHER RESP ORIENTATION from 12/06/2021 in [place]  Referring Provider Dr. Georgann Housekeeper       Encounter Date: 03/07/2022  Check In:  Session Check In - 03/07/22 1045       Check-In   Supervising physician immediately available to respond to emergencies [facility] MD immediately available    Physician(s) Dr Domenic Polite    Location [facility]    Staff Present Hoy Register MHA, MS, ACSM-CEP;Leana Roe, BS, Exercise Physiologist    Virtual Visit No    Medication changes reported     No    Fall or balance concerns reported    No    Tobacco Cessation No Change    Warm-up and Cool-down Performed as group-led instruction    Resistance Training Performed Yes    VAD Patient? No    PAD/SET Patient? No      Pain Assessment   Currently in Pain? No/denies    Pain Score 0-No pain    Multiple Pain Sites No             Capillary Blood Glucose: No results found for this or any previous visit (from the past 24 hour(s)).    Social History   Tobacco Use  Smoking Status Former   Packs/day: 0.75   Years: 20.00   Total pack years: 15.00   Types: Cigarettes  Smokeless Tobacco Former   Quit date: 10/31/1998    Goals Met:  Proper associated with RPD/PD & O2 Sat Independence with exercise equipment Using PLB without cueing & demonstrates good technique Exercise tolerated well Queuing for purse lip breathing No report of concerns or symptoms today Strength training completed today  Goals Unmet:  Not Applicable  Comments: checkout time is 1145   Dr. Kathie Dike is Medical Director for [facility].

## 2022-03-12 ENCOUNTER — Encounter (HOSPITAL_COMMUNITY): Payer: Medicare Other

## 2022-03-14 ENCOUNTER — Encounter (HOSPITAL_COMMUNITY): Payer: Medicare Other

## 2022-03-19 ENCOUNTER — Encounter (HOSPITAL_COMMUNITY): Payer: Medicare Other

## 2022-03-21 ENCOUNTER — Encounter (HOSPITAL_COMMUNITY): Payer: Medicare Other

## 2022-03-26 ENCOUNTER — Encounter (HOSPITAL_COMMUNITY): Payer: Medicare Other

## 2022-03-27 NOTE — Progress Notes (Signed)
Pulmonary Individual Treatment Plan  Patient Details  [name]: Paula [name] MRN: [id number] Date of Birth: 11-19-1950 Referring Provider:   [facility] from 12/06/2021 in [place]  Referring Provider Dr. Georgann Housekeeper       Initial Encounter Date:  Flowsheet Row PULMONARY REHAB OTHER RESP ORIENTATION from 12/06/2021 in [facility]  Date 12/06/21       Visit Diagnosis: Chronic obstructive pulmonary disease, unspecified COPD type ([place])  Patient's Home Medications on Admission:   Current Outpatient Medications:    acetaminophen (TYLENOL) 500 MG tablet, Take 500-1,000 mg by mouth every 6 (six) hours as needed (pain.)., Disp: , Rfl:    albuterol (VENTOLIN HFA) 108 (90 Base) MCG/ACT inhaler, Inhale 2 puffs into the lungs every 6 (six) hours as needed for wheezing or shortness of breath., Disp: , Rfl:    azelastine (ASTELIN) 0.1 % nasal spray, Place 2 sprays into both nostrils 2 (two) times daily as needed for allergies. Use in each nostril as directed, Disp: , Rfl:    budesonide-formoterol (SYMBICORT) 160-4.5 MCG/ACT inhaler, Inhale 2 puffs into the lungs 2 (two) times daily., Disp: , Rfl:    Cholecalciferol (VITAMIN D) 125 MCG (5000 UT) CAPS, Take 5,000 Units by mouth in the morning., Disp: , Rfl:    clotrimazole-betamethasone (LOTRISONE) cream, Apply 1 application topically 2 (two) times daily as needed (skin irritation.). , Disp: , Rfl:    diclofenac (VOLTAREN) 75 MG EC tablet, Take 75 mg by mouth daily as needed (arthritis pain)., Disp: , Rfl:    ezetimibe (ZETIA) 10 MG tablet, Take 10 mg by mouth in the morning., Disp: , Rfl:    famotidine (PEPCID) 40 MG tablet, Take 40 mg by mouth at bedtime., Disp: , Rfl:    fluticasone (FLONASE) 50 MCG/ACT nasal spray, Place 2 sprays into both nostrils daily as needed for allergies., Disp: , Rfl:    guaiFENesin (MUCINEX) 600 MG 12 hr tablet, Take 600 mg by mouth daily as  needed (congestion/COPD issues)., Disp: , Rfl:    hydrochlorothiazide (HYDRODIURIL) 25 MG tablet, Take 25 mg by mouth in the morning., Disp: , Rfl:    Ketotifen Fumarate (ALLERGY EYE DROPS OP), Place 1 drop into both eyes 2 (two) times daily as needed (allergy eyes)., Disp: , Rfl:    loratadine (CLARITIN) 10 MG tablet, Take 10 mg by mouth daily as needed for allergies., Disp: , Rfl:    losartan (COZAAR) 100 MG tablet, Take 100 mg by mouth in the morning., Disp: , Rfl:    montelukast (SINGULAIR) 10 MG tablet, Take 10 mg by mouth at bedtime., Disp: , Rfl:    Multiple Vitamins-Minerals (PRESERVISION AREDS 2 PO), Take 1 tablet by mouth in the morning and at bedtime., Disp: , Rfl:    NON FORMULARY, CPAP, O2 at night, Disp: , Rfl:    omeprazole (PRILOSEC) 40 MG capsule, Take 40 mg by mouth daily before breakfast., Disp: , Rfl:    sertraline (ZOLOFT) 100 MG tablet, Take 100 mg by mouth at bedtime., Disp: , Rfl:    terbinafine (LAMISIL) 250 MG tablet, Take 250 mg by mouth in the morning., Disp: , Rfl:   Past Medical History: Past Medical History:  Diagnosis Date   Anxiety    Arthritis    COPD (chronic obstructive pulmonary disease) ([place])    Heart murmur    Hypertension    Lung nodule    LLL   Sleep apnea    CPAP  with O2 2 liters    Tubal ectopic pregnancy 1970   Vitelliform macular dystrophy     Tobacco Use: Social History   Tobacco Use  Smoking Status Former   Packs/day: 0.75   Years: 20.00   Total pack years: 15.00   Types: Cigarettes  Smokeless Tobacco Former   Quit date: 10/31/1998    Labs: Review Flowsheet        No data to display          Capillary Blood Glucose: No results found for: "GLUCAP"   Pulmonary Assessment Scores:  Pulmonary Assessment Scores     Row [name] 12/06/21 1250         ADL UCSD   ADL Phase Entry     SOB Score total 33     Rest 0     Walk 3     Stairs 4     Bath 0     Dress 1     Shop 2       CAT Score   CAT Score 11       mMRC  Score   mMRC Score 2             UCSD: Self-administered rating of dyspnea associated with activities of daily living (ADLs) 6-point scale (0 = "not at all" to 5 = "maximal or unable to do because of breathlessness")  Scoring Scores range from 0 to 120.  Minimally important difference is 5 units  CAT: CAT can identify the health impairment of COPD patients and is better correlated with disease progression.  CAT has a scoring range of zero to 40. The CAT score is classified into four groups of low (less than 10), medium (10 - 20), high (21-30) and very high (31-40) based on the impact level of disease on health status. A CAT score over 10 suggests significant symptoms.  A worsening CAT score could be explained by an exacerbation, poor medication adherence, poor inhaler technique, or progression of COPD or comorbid conditions.  CAT MCID is 2 points  mMRC: mMRC (Modified Medical Research Council) Dyspnea Scale is used to assess the degree of baseline functional disability in patients of respiratory disease due to dyspnea. No minimal important difference is established. A decrease in score of 1 point or greater is considered a positive change.   Pulmonary Function Assessment:   Exercise Target Goals: Exercise Program Goal: Individual exercise prescription set using results from initial 6 min walk test and THRR while considering  patient's activity barriers and safety.   Exercise Prescription Goal: Initial exercise prescription builds to 30-45 minutes a day of aerobic activity, 2-3 days per week.  Home exercise guidelines will be given to patient during program as part of exercise prescription that the participant will acknowledge.  Activity Barriers & Risk Stratification:  Activity Barriers & Cardiac Risk Stratification - 12/06/21 1313       Activity Barriers & Cardiac Risk Stratification   Activity Barriers Arthritis;Shortness of Breath    Cardiac Risk Stratification Low              6 Minute Walk:  6 Minute Walk     Row [name] 12/06/21 1357         6 Minute Walk   Phase Initial     Distance 800 feet     Walk Time 6 minutes     # of Rest Breaks 2     MPH 1.51     METS 1.11  RPE 14     Perceived Dyspnea  14     VO2 Peak 3.89     Symptoms Yes (comment)     Comments two standing breaks due to SOB and bilateral hip pain (6/10) 20 & 30 seconds.     Resting HR 73 bpm     Resting BP 134/66     Resting Oxygen Saturation  94 %     Exercise Oxygen Saturation  during 6 min walk 87 %     Max Ex. HR 120 bpm     Max Ex. BP 160/66     2 Minute Post BP 140/66       Interval HR   1 Minute HR 100     2 Minute HR 103     3 Minute HR 103     4 Minute HR 113     5 Minute HR 120     6 Minute HR 115     2 Minute Post HR 76     Interval Heart Rate? Yes       Interval Oxygen   Interval Oxygen? Yes     Baseline Oxygen Saturation % 94 %     1 Minute Oxygen Saturation % 87 %     1 Minute Liters of Oxygen 0 L     2 Minute Oxygen Saturation % 88 %     2 Minute Liters of Oxygen 0 L     3 Minute Oxygen Saturation % 90 %     3 Minute Liters of Oxygen 0 L     4 Minute Oxygen Saturation % 87 %     4 Minute Liters of Oxygen 0 L     5 Minute Oxygen Saturation % 87 %     5 Minute Liters of Oxygen 0 L     6 Minute Oxygen Saturation % 87 %     6 Minute Liters of Oxygen 0 L     2 Minute Post Oxygen Saturation % 95 %     2 Minute Post Liters of Oxygen 0 L              Oxygen Initial Assessment:  Oxygen Initial Assessment - 12/06/21 1249       Home Oxygen   Home Oxygen Device Home Concentrator    Sleep Oxygen Prescription Continuous;CPAP    Liters per minute 2    Home Exercise Oxygen Prescription None    Home Resting Oxygen Prescription None    Compliance with Home Oxygen Use Yes      Initial 6 min Walk   Oxygen Used None      Program Oxygen Prescription   Program Oxygen Prescription None      Intervention   Short Term Goals To learn and exhibit  compliance with exercise, home and travel O2 prescription;To learn and understand importance of monitoring SPO2 with pulse oximeter and demonstrate accurate use of the pulse oximeter.;To learn and understand importance of maintaining oxygen saturations>88%;To learn and demonstrate proper pursed lip breathing techniques or other breathing techniques.     Long  Term Goals Exhibits compliance with exercise, home  and travel O2 prescription;Verbalizes importance of monitoring SPO2 with pulse oximeter and return demonstration;Maintenance of O2 saturations>88%;Exhibits proper breathing techniques, such as pursed lip breathing or other method taught during program session             Oxygen Re-Evaluation:  Oxygen Re-Evaluation     Row [name] 01/01/22 1344 01/29/22 1334 02/26/22  1303 03/26/22 1014       Program Oxygen Prescription   Program Oxygen Prescription None None None None      Home Oxygen   Home Oxygen Device Home Concentrator Home Concentrator Home Concentrator Home Concentrator    Sleep Oxygen Prescription Continuous;CPAP Continuous;CPAP Continuous;CPAP Continuous;CPAP    Liters per minute '2 2 2 2    '$ Home Exercise Oxygen Prescription None None None None    Home Resting Oxygen Prescription None None None None    Compliance with Home Oxygen Use Yes Yes Yes Yes      Goals/Expected Outcomes   Short Term Goals To learn and exhibit compliance with exercise, home and travel O2 prescription;To learn and understand importance of monitoring SPO2 with pulse oximeter and demonstrate accurate use of the pulse oximeter.;To learn and understand importance of maintaining oxygen saturations>88%;To learn and demonstrate proper pursed lip breathing techniques or other breathing techniques.  To learn and exhibit compliance with exercise, home and travel O2 prescription;To learn and understand importance of monitoring SPO2 with pulse oximeter and demonstrate accurate use of the pulse oximeter.;To learn and  understand importance of maintaining oxygen saturations>88%;To learn and demonstrate proper pursed lip breathing techniques or other breathing techniques.  To learn and exhibit compliance with exercise, home and travel O2 prescription;To learn and understand importance of monitoring SPO2 with pulse oximeter and demonstrate accurate use of the pulse oximeter.;To learn and understand importance of maintaining oxygen saturations>88%;To learn and demonstrate proper pursed lip breathing techniques or other breathing techniques.  To learn and exhibit compliance with exercise, home and travel O2 prescription;To learn and understand importance of monitoring SPO2 with pulse oximeter and demonstrate accurate use of the pulse oximeter.;To learn and understand importance of maintaining oxygen saturations>88%;To learn and demonstrate proper pursed lip breathing techniques or other breathing techniques.     Long  Term Goals Exhibits compliance with exercise, home  and travel O2 prescription;Verbalizes importance of monitoring SPO2 with pulse oximeter and return demonstration;Maintenance of O2 saturations>88%;Exhibits proper breathing techniques, such as pursed lip breathing or other method taught during program session Exhibits compliance with exercise, home  and travel O2 prescription;Verbalizes importance of monitoring SPO2 with pulse oximeter and return demonstration;Maintenance of O2 saturations>88%;Exhibits proper breathing techniques, such as pursed lip breathing or other method taught during program session Exhibits compliance with exercise, home  and travel O2 prescription;Verbalizes importance of monitoring SPO2 with pulse oximeter and return demonstration;Maintenance of O2 saturations>88%;Exhibits proper breathing techniques, such as pursed lip breathing or other method taught during program session Exhibits compliance with exercise, home  and travel O2 prescription;Verbalizes importance of monitoring SPO2 with pulse  oximeter and return demonstration;Maintenance of O2 saturations>88%;Exhibits proper breathing techniques, such as pursed lip breathing or other method taught during program session    Goals/Expected Outcomes compliance compliance compliance compliance             Oxygen Discharge (Final Oxygen Re-Evaluation):  Oxygen Re-Evaluation - 03/26/22 1014       Program Oxygen Prescription   Program Oxygen Prescription None      Home Oxygen   Home Oxygen Device Home Concentrator    Sleep Oxygen Prescription Continuous;CPAP    Liters per minute 2    Home Exercise Oxygen Prescription None    Home Resting Oxygen Prescription None    Compliance with Home Oxygen Use Yes      Goals/Expected Outcomes   Short Term Goals To learn and exhibit compliance with exercise, home and travel O2 prescription;To learn and understand importance  of monitoring SPO2 with pulse oximeter and demonstrate accurate use of the pulse oximeter.;To learn and understand importance of maintaining oxygen saturations>88%;To learn and demonstrate proper pursed lip breathing techniques or other breathing techniques.     Long  Term Goals Exhibits compliance with exercise, home  and travel O2 prescription;Verbalizes importance of monitoring SPO2 with pulse oximeter and return demonstration;Maintenance of O2 saturations>88%;Exhibits proper breathing techniques, such as pursed lip breathing or other method taught during program session    Goals/Expected Outcomes compliance             Initial Exercise Prescription:  Initial Exercise Prescription - 12/06/21 1400       Date of Initial Exercise RX and Referring Provider   Date 12/06/21    Referring Provider Dr. Georgann Housekeeper    Expected Discharge Date 04/11/22      NuStep   Level 1    SPM 60    Minutes 22      Arm Ergometer   Level 1    RPM 45    Minutes 17      Prescription Details   Frequency (times per week) 2    Duration Progress to 30 minutes of continuous aerobic  without signs/symptoms of physical distress      Intensity   THRR 40-80% of Max Heartrate 52-118    Ratings of Perceived Exertion 11-13    Perceived Dyspnea 0-4      Resistance Training   Training Prescription Yes    Weight 2    Reps 10-15             Perform Capillary Blood Glucose checks as needed.  Exercise Prescription Changes:   Exercise Prescription Changes     Row [name] 12/20/21 1200 01/01/22 1300 01/15/22 1200 01/29/22 1300 02/12/22 1200     Response to Exercise   Blood Pressure (Admit) 132/76 128/60 144/64 128/72 122/72   Blood Pressure (Exercise) 160/78 142/70 160/70 160/72 140/76   Blood Pressure (Exit) 118/64 126/64 124/72 138/90 160/80   Heart Rate (Admit) 70 bpm 76 bpm 82 bpm 95 bpm 83 bpm   Heart Rate (Exercise) 79 bpm 85 bpm 90 bpm 95 bpm 90 bpm   Heart Rate (Exit) 64 bpm 70 bpm 72 bpm 83 bpm 82 bpm   Oxygen Saturation (Admit) 94 % 95 % 95 % 93 % 94 %   Oxygen Saturation (Exercise) 93 % 92 % 94 % 92 % 93 %   Oxygen Saturation (Exit) 95 % 95 % 94 % 95 % 96 %   Rating of Perceived Exertion (Exercise) '12 12 12 12 12   '$ Perceived Dyspnea (Exercise) '12 12 12 12 12   '$ Duration Continue with 30 min of aerobic exercise without signs/symptoms of physical distress. Continue with 30 min of aerobic exercise without signs/symptoms of physical distress. Continue with 30 min of aerobic exercise without signs/symptoms of physical distress. Continue with 30 min of aerobic exercise without signs/symptoms of physical distress. Continue with 30 min of aerobic exercise without signs/symptoms of physical distress.   Intensity THRR unchanged THRR unchanged THRR unchanged THRR unchanged THRR unchanged     Progression   Progression Continue to progress workloads to maintain intensity without signs/symptoms of physical distress. Continue to progress workloads to maintain intensity without signs/symptoms of physical distress. Continue to progress workloads to maintain intensity without  signs/symptoms of physical distress. Continue to progress workloads to maintain intensity without signs/symptoms of physical distress. Continue to progress workloads to maintain intensity without signs/symptoms of physical distress.  Resistance Training   Training Prescription Yes Yes Yes Yes Yes   Weight '2 3 3 3 3   '$ Reps 10-15 10-15 10-15 10-15 10-15   Time 10 Minutes 10 Minutes 10 Minutes 10 Minutes 10 Minutes     NuStep   Level '1 2 2 2 2   '$ SPM 91 102 107 106 109   Minutes '22 22 22 22 22   '$ METs 2 2 2.1 1.9 1.9     Arm Ergometer   Level '1 1 2 2 2   '$ RPM 64 70 66 69 64   Minutes '17 17 17 17 17   '$ METs 1.6 1.7 2.1 2.2 2    Row [name] 02/26/22 1200 03/07/22 1200           Response to Exercise   Blood Pressure (Admit) 132/78 140/70      Blood Pressure (Exercise) 148/84 144/72      Blood Pressure (Exit) 138/70 122/68      Heart Rate (Admit) 82 bpm 83 bpm      Heart Rate (Exercise) 89 bpm 88 bpm      Heart Rate (Exit) 79 bpm 74 bpm      Oxygen Saturation (Admit) 94 % 91 %      Oxygen Saturation (Exercise) 93 % 93 %      Oxygen Saturation (Exit) 94 % 96 %      Rating of Perceived Exertion (Exercise) 13 13      Perceived Dyspnea (Exercise) 12 13      Duration Continue with 30 min of aerobic exercise without signs/symptoms of physical distress. Continue with 30 min of aerobic exercise without signs/symptoms of physical distress.      Intensity THRR unchanged THRR unchanged        Progression   Progression Continue to progress workloads to maintain intensity without signs/symptoms of physical distress. Continue to progress workloads to maintain intensity without signs/symptoms of physical distress.        Resistance Training   Training Prescription Yes Yes      Weight 3 3      Reps 10-15 10-15      Time 10 Minutes 10 Minutes        NuStep   Level 2 3      SPM 114 107      Minutes 22 22      METs 2 2        Arm Ergometer   Level 2 2      RPM 68 65      Minutes 17 17       METs 2.1 2.1               Exercise Comments:   Exercise Goals and Review:   Exercise Goals     Row [name] 12/06/21 1402 01/01/22 1342 01/29/22 1328 02/26/22 1259 03/26/22 1011     Exercise Goals   Increase Physical Activity Yes Yes Yes Yes Yes   Intervention Provide advice, education, support and counseling about physical activity/exercise needs.;Develop an individualized exercise prescription for aerobic and resistive training based on initial evaluation findings, risk stratification, comorbidities and participant's personal goals. Provide advice, education, support and counseling about physical activity/exercise needs.;Develop an individualized exercise prescription for aerobic and resistive training based on initial evaluation findings, risk stratification, comorbidities and participant's personal goals. Provide advice, education, support and counseling about physical activity/exercise needs.;Develop an individualized exercise prescription for aerobic and resistive training based on initial evaluation findings, risk stratification, comorbidities  and participant's personal goals. Provide advice, education, support and counseling about physical activity/exercise needs.;Develop an individualized exercise prescription for aerobic and resistive training based on initial evaluation findings, risk stratification, comorbidities and participant's personal goals. Provide advice, education, support and counseling about physical activity/exercise needs.;Develop an individualized exercise prescription for aerobic and resistive training based on initial evaluation findings, risk stratification, comorbidities and participant's personal goals.   Expected Outcomes Short Term: Attend rehab on a regular basis to increase amount of physical activity.;Long Term: Add in home exercise to make exercise part of routine and to increase amount of physical activity.;Long Term: Exercising regularly at least 3-5 days a  week. Short Term: Attend rehab on a regular basis to increase amount of physical activity.;Long Term: Add in home exercise to make exercise part of routine and to increase amount of physical activity.;Long Term: Exercising regularly at least 3-5 days a week. Short Term: Attend rehab on a regular basis to increase amount of physical activity.;Long Term: Add in home exercise to make exercise part of routine and to increase amount of physical activity.;Long Term: Exercising regularly at least 3-5 days a week. Short Term: Attend rehab on a regular basis to increase amount of physical activity.;Long Term: Add in home exercise to make exercise part of routine and to increase amount of physical activity.;Long Term: Exercising regularly at least 3-5 days a week. Short Term: Attend rehab on a regular basis to increase amount of physical activity.;Long Term: Add in home exercise to make exercise part of routine and to increase amount of physical activity.;Long Term: Exercising regularly at least 3-5 days a week.   Increase Strength and Stamina Yes Yes Yes Yes Yes   Intervention Provide advice, education, support and counseling about physical activity/exercise needs.;Develop an individualized exercise prescription for aerobic and resistive training based on initial evaluation findings, risk stratification, comorbidities and participant's personal goals. Provide advice, education, support and counseling about physical activity/exercise needs.;Develop an individualized exercise prescription for aerobic and resistive training based on initial evaluation findings, risk stratification, comorbidities and participant's personal goals. Provide advice, education, support and counseling about physical activity/exercise needs.;Develop an individualized exercise prescription for aerobic and resistive training based on initial evaluation findings, risk stratification, comorbidities and participant's personal goals. Provide advice,  education, support and counseling about physical activity/exercise needs.;Develop an individualized exercise prescription for aerobic and resistive training based on initial evaluation findings, risk stratification, comorbidities and participant's personal goals. Provide advice, education, support and counseling about physical activity/exercise needs.;Develop an individualized exercise prescription for aerobic and resistive training based on initial evaluation findings, risk stratification, comorbidities and participant's personal goals.   Expected Outcomes Short Term: Increase workloads from initial exercise prescription for resistance, speed, and METs.;Short Term: Perform resistance training exercises routinely during rehab and add in resistance training at home;Long Term: Improve cardiorespiratory fitness, muscular endurance and strength as measured by increased METs and functional capacity (6MWT) Short Term: Increase workloads from initial exercise prescription for resistance, speed, and METs.;Short Term: Perform resistance training exercises routinely during rehab and add in resistance training at home;Long Term: Improve cardiorespiratory fitness, muscular endurance and strength as measured by increased METs and functional capacity (6MWT) Short Term: Increase workloads from initial exercise prescription for resistance, speed, and METs.;Short Term: Perform resistance training exercises routinely during rehab and add in resistance training at home;Long Term: Improve cardiorespiratory fitness, muscular endurance and strength as measured by increased METs and functional capacity (6MWT) Short Term: Increase workloads from initial exercise prescription for resistance, speed, and METs.;Short Term: Perform  resistance training exercises routinely during rehab and add in resistance training at home;Long Term: Improve cardiorespiratory fitness, muscular endurance and strength as measured by increased METs and functional  capacity (6MWT) Short Term: Increase workloads from initial exercise prescription for resistance, speed, and METs.;Short Term: Perform resistance training exercises routinely during rehab and add in resistance training at home;Long Term: Improve cardiorespiratory fitness, muscular endurance and strength as measured by increased METs and functional capacity (6MWT)   Able to understand and use rate of perceived exertion (RPE) scale Yes Yes Yes Yes Yes   Intervention Provide education and explanation on how to use RPE scale Provide education and explanation on how to use RPE scale Provide education and explanation on how to use RPE scale Provide education and explanation on how to use RPE scale Provide education and explanation on how to use RPE scale   Expected Outcomes Short Term: Able to use RPE daily in rehab to express subjective intensity level;Long Term:  Able to use RPE to guide intensity level when exercising independently Short Term: Able to use RPE daily in rehab to express subjective intensity level;Long Term:  Able to use RPE to guide intensity level when exercising independently Short Term: Able to use RPE daily in rehab to express subjective intensity level;Long Term:  Able to use RPE to guide intensity level when exercising independently Short Term: Able to use RPE daily in rehab to express subjective intensity level;Long Term:  Able to use RPE to guide intensity level when exercising independently Short Term: Able to use RPE daily in rehab to express subjective intensity level;Long Term:  Able to use RPE to guide intensity level when exercising independently   Able to understand and use Dyspnea scale Yes Yes Yes Yes Yes   Intervention Provide education and explanation on how to use Dyspnea scale Provide education and explanation on how to use Dyspnea scale Provide education and explanation on how to use Dyspnea scale Provide education and explanation on how to use Dyspnea scale Provide education  and explanation on how to use Dyspnea scale   Expected Outcomes Short Term: Able to use Dyspnea scale daily in rehab to express subjective sense of shortness of breath during exertion;Long Term: Able to use Dyspnea scale to guide intensity level when exercising independently Short Term: Able to use Dyspnea scale daily in rehab to express subjective sense of shortness of breath during exertion;Long Term: Able to use Dyspnea scale to guide intensity level when exercising independently Short Term: Able to use Dyspnea scale daily in rehab to express subjective sense of shortness of breath during exertion;Long Term: Able to use Dyspnea scale to guide intensity level when exercising independently Short Term: Able to use Dyspnea scale daily in rehab to express subjective sense of shortness of breath during exertion;Long Term: Able to use Dyspnea scale to guide intensity level when exercising independently Short Term: Able to use Dyspnea scale daily in rehab to express subjective sense of shortness of breath during exertion;Long Term: Able to use Dyspnea scale to guide intensity level when exercising independently   Knowledge and understanding of Target Heart Rate Range (THRR) Yes Yes Yes Yes Yes   Intervention Provide education and explanation of THRR including how the numbers were predicted and where they are located for reference Provide education and explanation of THRR including how the numbers were predicted and where they are located for reference Provide education and explanation of THRR including how the numbers were predicted and where they are located for reference Provide  education and explanation of THRR including how the numbers were predicted and where they are located for reference Provide education and explanation of THRR including how the numbers were predicted and where they are located for reference   Expected Outcomes Short Term: Able to state/look up THRR;Short Term: Able to use daily as guideline  for intensity in rehab;Long Term: Able to use THRR to govern intensity when exercising independently Short Term: Able to state/look up THRR;Short Term: Able to use daily as guideline for intensity in rehab;Long Term: Able to use THRR to govern intensity when exercising independently Short Term: Able to state/look up THRR;Short Term: Able to use daily as guideline for intensity in rehab;Long Term: Able to use THRR to govern intensity when exercising independently Short Term: Able to state/look up THRR;Short Term: Able to use daily as guideline for intensity in rehab;Long Term: Able to use THRR to govern intensity when exercising independently Short Term: Able to state/look up THRR;Short Term: Able to use daily as guideline for intensity in rehab;Long Term: Able to use THRR to govern intensity when exercising independently   Able to check pulse independently Yes Yes -- -- Yes   Intervention Provide education and demonstration on how to check pulse in carotid and radial arteries.;Review the importance of being able to check your own pulse for safety during independent exercise Provide education and demonstration on how to check pulse in carotid and radial arteries.;Review the importance of being able to check your own pulse for safety during independent exercise Provide education and demonstration on how to check pulse in carotid and radial arteries.;Review the importance of being able to check your own pulse for safety during independent exercise -- Provide education and demonstration on how to check pulse in carotid and radial arteries.;Review the importance of being able to check your own pulse for safety during independent exercise   Expected Outcomes Short Term: Able to explain why pulse checking is important during independent exercise;Long Term: Able to check pulse independently and accurately Short Term: Able to explain why pulse checking is important during independent exercise;Long Term: Able to check  pulse independently and accurately Short Term: Able to explain why pulse checking is important during independent exercise;Long Term: Able to check pulse independently and accurately -- Short Term: Able to explain why pulse checking is important during independent exercise;Long Term: Able to check pulse independently and accurately   Understanding of Exercise Prescription Yes Yes Yes Yes Yes   Intervention Provide education, explanation, and written materials on patient's individual exercise prescription Provide education, explanation, and written materials on patient's individual exercise prescription Provide education, explanation, and written materials on patient's individual exercise prescription Provide education, explanation, and written materials on patient's individual exercise prescription Provide education, explanation, and written materials on patient's individual exercise prescription   Expected Outcomes Short Term: Able to explain program exercise prescription;Long Term: Able to explain home exercise prescription to exercise independently Short Term: Able to explain program exercise prescription;Long Term: Able to explain home exercise prescription to exercise independently Short Term: Able to explain program exercise prescription;Long Term: Able to explain home exercise prescription to exercise independently Short Term: Able to explain program exercise prescription;Long Term: Able to explain home exercise prescription to exercise independently Short Term: Able to explain program exercise prescription;Long Term: Able to explain home exercise prescription to exercise independently            Exercise Goals Re-Evaluation :  Exercise Goals Re-Evaluation     Paula [name] 01/01/22 1342 01/29/22  1329 01/30/22 0731 02/26/22 1300 03/26/22 1012     Exercise Goal Re-Evaluation   Exercise Goals Review Increase Physical Activity;Increase Strength and Stamina;Able to understand and use rate of perceived  exertion (RPE) scale;Able to understand and use Dyspnea scale;Knowledge and understanding of Target Heart Rate Range (THRR);Understanding of Exercise Prescription Increase Physical Activity;Increase Strength and Stamina;Able to understand and use rate of perceived exertion (RPE) scale;Able to understand and use Dyspnea scale;Knowledge and understanding of Target Heart Rate Range (THRR);Understanding of Exercise Prescription -- Increase Physical Activity;Increase Strength and Stamina;Able to understand and use rate of perceived exertion (RPE) scale;Able to understand and use Dyspnea scale;Knowledge and understanding of Target Heart Rate Range (THRR);Understanding of Exercise Prescription Increase Physical Activity;Able to understand and use rate of perceived exertion (RPE) scale;Increase Strength and Stamina;Able to understand and use Dyspnea scale;Knowledge and understanding of Target Heart Rate Range (THRR);Understanding of Exercise Prescription   Comments Pt has completed 7 sessions of PR. She is motivated during class and has increased her levels on the stepper. She has increased her weights and band for the warm up. She is currently exercising at 2.0 METs on the stepper. Will continue to monitor and progress as able. Pt has completed 13 sessions of PR. She continues to increase her workloads on both pieces of equipment. She enjous coming to class each week. She is currently exercising at 2.2 METs on the AE. Will continue to monitor and progress as able. -- Pt has completed19 sessions of PR. She continues to enjoy coming to class and is pushes herself during class. She has not increased her level but does improve on her SPM/RPM each class. Pt is currently exercising at 2.1 METs on the AE. Will continue to monitor and progress as able. Pt has completed 21 session of PR. She continues to be motivated during class and is pushing herself to increase her SPM and level. Pt is curretnly exercising at 2.2 METs on the  stepper. Will continue to montior and progress as able.   Expected Outcomes Through exercise at home and at rehab, the patient will meet their stated goals. Through exercise at home and at rehab, the patient will meet their stated goals. Through exercise at home and at rehab, the patient will meet their stated goals. Through exercise at home and at rehab, the patient will meet their stated goals. Through exercise at home and at rehab, the patient will meet their stated goals.            Discharge Exercise Prescription (Final Exercise Prescription Changes):  Exercise Prescription Changes - 03/07/22 1200       Response to Exercise   Blood Pressure (Admit) 140/70    Blood Pressure (Exercise) 144/72    Blood Pressure (Exit) 122/68    Heart Rate (Admit) 83 bpm    Heart Rate (Exercise) 88 bpm    Heart Rate (Exit) 74 bpm    Oxygen Saturation (Admit) 91 %    Oxygen Saturation (Exercise) 93 %    Oxygen Saturation (Exit) 96 %    Rating of Perceived Exertion (Exercise) 13    Perceived Dyspnea (Exercise) 13    Duration Continue with 30 min of aerobic exercise without signs/symptoms of physical distress.    Intensity THRR unchanged      Progression   Progression Continue to progress workloads to maintain intensity without signs/symptoms of physical distress.      Resistance Training   Training Prescription Yes    Weight 3    Reps 10-15  Time 10 Minutes      NuStep   Level 3    SPM 107    Minutes 22    METs 2      Arm Ergometer   Level 2    RPM 65    Minutes 17    METs 2.1             Nutrition:  Target Goals: Understanding of nutrition guidelines, daily intake of sodium '1500mg'$ , cholesterol '200mg'$ , calories 30% from fat and 7% or less from saturated fats, daily to have 5 or more servings of fruits and vegetables.  Biometrics:  Pre Biometrics - 12/06/21 1402       Pre Biometrics   Height 5' (1.524 m)    Weight 119.8 kg    Waist Circumference 54 inches    Hip  Circumference 58 inches    Waist to Hip Ratio 0.93 %    BMI (Calculated) 51.58    Triceps Skinfold 45 mm    % Body Fat 63.3 %    Grip Strength 12.3 kg    Flexibility 0 in    Single Leg Stand 0 seconds              Nutrition Therapy Plan and Nutrition Goals:  Nutrition Therapy & Goals - 12/06/21 1246       Personal Nutrition Goals   Comments Patient scored 52 on her diet assessment. Handout provided and explained regarding healthier choices. We offer 2 educational sessions on heart healthy nutrition with handouts and assistance with RD referral if patient is interested.      Intervention Plan   Intervention Nutrition handout(s) given to patient.    Expected Outcomes Short Term Goal: Understand basic principles of dietary content, such as calories, fat, sodium, cholesterol and nutrients.             Nutrition Assessments:  Nutrition Assessments - 12/06/21 1246       MEDFICTS Scores   Pre Score 52            MEDIFICTS Score Key: ?70 Need to make dietary changes  40-70 Heart Healthy Diet ? 40 Therapeutic Level Cholesterol Diet   Picture Your Plate Scores: <24 Unhealthy dietary pattern with much room for improvement. 41-50 Dietary pattern unlikely to meet recommendations for good health and room for improvement. 51-60 More healthful dietary pattern, with some room for improvement.  >60 Healthy dietary pattern, although there may be some specific behaviors that could be improved.    Nutrition Goals Re-Evaluation:   Nutrition Goals Discharge (Final Nutrition Goals Re-Evaluation):   Psychosocial: Target Goals: Acknowledge presence or absence of significant depression and/or stress, maximize coping skills, provide positive support system. Participant is able to verbalize types and ability to use techniques and skills needed for reducing stress and depression.  Initial Review & Psychosocial Screening:  Initial Psych Review & Screening - 12/06/21 1348        Initial Review   Current issues with Current Psychotropic Meds;Current Depression;Current Sleep Concerns      Family Dynamics   Good Support System? Yes      Barriers   Psychosocial barriers to participate in program There are no identifiable barriers or psychosocial needs.      Screening Interventions   Interventions Encouraged to exercise;Provide feedback about the scores to participant    Expected Outcomes Short Term goal: Identification and review with participant of any Quality of Life or Depression concerns found by scoring the questionnaire.  Quality of Life Scores:  Quality of Life - 12/06/21 1403       Quality of Life   Select Quality of Life      Quality of Life Scores   Health/Function Pre 11.63 %    Socioeconomic Pre 17.14 %    Psych/Spiritual Pre 14.93 %    Family Pre 21 %    GLOBAL Pre 14.54 %            Scores of 19 and below usually indicate a poorer quality of life in these areas.  A difference of  2-3 points is a clinically meaningful difference.  A difference of 2-3 points in the total score of the Quality of Life Index has been associated with significant improvement in overall quality of life, self-image, physical symptoms, and general health in studies assessing change in quality of life.   PHQ-9: Review Flowsheet       12/06/2021  Depression screen PHQ 2/9  Decreased Interest 2  Down, Depressed, Hopeless 0  PHQ - 2 Score 2  Altered sleeping 1  Tired, decreased energy 2  Change in appetite 2  Feeling bad or failure about yourself  0  Trouble concentrating 0  Moving slowly or fidgety/restless 0  Suicidal thoughts 0  PHQ-9 Score 7  Difficult doing work/chores Somewhat difficult   Interpretation of Total Score  Total Score Depression Severity:  1-4 = Minimal depression, 5-9 = Mild depression, 10-14 = Moderate depression, 15-19 = Moderately severe depression, 20-27 = Severe depression   Psychosocial Evaluation and  Intervention:  Psychosocial Evaluation - 12/06/21 1349       Psychosocial Evaluation & Interventions   Interventions Stress management education;Relaxation education;Encouraged to exercise with the program and follow exercise prescription    Comments Patient has no psychosocial barriers identified to participate in PR at her orientation visit. Her PHQ-9 score was 7 due to her lack of energy and sleep concerns mainly. She says she started taking the Zoloft a few years ago when her COPD started preventing her from doing the things she wanted to do. She feels her depression is managed well. She lives with her husband of many years. They do not have children but she says she help raise 2 of her first cousins and their children as well. She names her husband and 2 cousins as her support people. She is a retired Licensed conveyancer. She says she used to be very active in her yard and garden and wants to be able to do these things again. She is ready to start the program hoping to get back to some of these activities.    Expected Outcomes Patient's depression will continue to be managed and she will continue to have no psychosocial barriers identified.    Continue Psychosocial Services  No Follow up required             Psychosocial Re-Evaluation:  Psychosocial Re-Evaluation     Fontanelle [name] 12/26/21 1248 01/22/22 0909 02/19/22 0811 03/15/22 1007       Psychosocial Re-Evaluation   Current issues with -- -- Current Depression;Current Sleep Concerns;Current Psychotropic Meds Current Depression;Current Sleep Concerns;Current Psychotropic Meds    Comments Paula [name] is doing well in her first sessions in PR.  Pt is working towards being able to work in her yard and increased energy for [employer].  Well supported by her husband.  Pts depression is mamnaged will with Zoloft. Pt is working towards being able to work in her yard and increased energy for  ADL's.  Well supported by her husband.  Pts depression is mamnaged will  with Zoloft.  Paula [name] seems to be enjoying the program and socializing with with class members and staff.  Patient has no current psychosocial issues currently. Well supported by her husband.  Pts depression is managed with Zoloft.  Paula [name] seems to be enjoying the program and socializing with class members and staff.  Pt has gained 5 pounds since first visit, however she will be provided with healthy eating habbits booklet today 02/19/22, to help her reach her goal of weight reduction  Patient has no current psychosocial issues currently. Well supported by her husband.  Pts depression is managed with Zoloft.  Paula [name] seems to be enjoying the program and socializing with class members and staff.  Patient has no current psychosocial issues currently.    Expected Outcomes Pt will complete the progra, meeting both program and personal goals. Pt will complete the program, meeting both program and personal goals. Pt will complete the program, meeting both program and personal goals. Pt will complete the program, meeting both program and personal goals.    Interventions Relaxation education;Encouraged to attend Pulmonary Rehabilitation for the exercise;Stress management education Relaxation education;Encouraged to attend Pulmonary Rehabilitation for the exercise;Stress management education Relaxation education;Encouraged to attend Pulmonary Rehabilitation for the exercise;Stress management education Relaxation education;Encouraged to attend Pulmonary Rehabilitation for the exercise;Stress management education    Continue Psychosocial Services  No Follow up required No Follow up required No Follow up required No Follow up required             Psychosocial Discharge (Final Psychosocial Re-Evaluation):  Psychosocial Re-Evaluation - 03/15/22 1007       Psychosocial Re-Evaluation   Current issues with Current Depression;Current Sleep Concerns;Current Psychotropic Meds    Comments Well supported by her husband.  Pts  depression is managed with Zoloft.  Paula [name] seems to be enjoying the program and socializing with class members and staff.  Patient has no current psychosocial issues currently.    Expected Outcomes Pt will complete the program, meeting both program and personal goals.    Interventions Relaxation education;Encouraged to attend Pulmonary Rehabilitation for the exercise;Stress management education    Continue Psychosocial Services  No Follow up required              Education: Education Goals: Education classes will be provided on a weekly basis, covering required topics. Participant will state understanding/return demonstration of topics presented.  Learning Barriers/Preferences:  Learning Barriers/Preferences - 12/06/21 1345       Learning Barriers/Preferences   Learning Barriers None    Learning Preferences Written Material;Skilled Demonstration             Education Topics: How Lungs Work and Diseases: - Discuss the anatomy of the lungs and diseases that can affect the lungs, such as COPD. Flowsheet Row PULMONARY REHAB OTHER RESPIRATORY from 03/07/2022 in [place]  Date 12/27/21  Educator DF  Instruction Review Code 1- Verbalizes Understanding       Exercise: -Discuss the importance of exercise, FITT principles of exercise, normal and abnormal responses to exercise, and how to exercise safely.   Environmental Irritants: -Discuss types of environmental irritants and how to limit exposure to environmental irritants.   Meds/Inhalers and oxygen: - Discuss respiratory medications, definition of an inhaler and oxygen, and the proper way to use an inhaler and oxygen. Flowsheet Row PULMONARY REHAB OTHER RESPIRATORY from 03/07/2022 in [place]  Date 01/10/22  Educator Gerda Diss  Energy Saving Techniques: - Discuss methods to conserve energy and decrease shortness of breath when performing activities of daily living.   Flowsheet Row PULMONARY REHAB OTHER RESPIRATORY from 03/07/2022 in [place]  Date 01/17/22  Educator Handout  Instruction Review Code 1- Verbalizes Understanding       Bronchial Hygiene / Breathing Techniques: - Discuss breathing mechanics, pursed-lip breathing technique,  proper posture, effective ways to clear airways, and other functional breathing techniques Flowsheet Row PULMONARY REHAB OTHER RESPIRATORY from 03/07/2022 in [place]  Date 01/24/22  Educator HB  Instruction Review Code 1- Research scientist (medical): - Provides group verbal and written instruction about the health risks of elevated stress, cause of high stress, and healthy ways to reduce stress.   Nutrition I: Fats: - Discuss the types of cholesterol, what cholesterol does to the body, and how cholesterol levels can be controlled. Flowsheet Row PULMONARY REHAB OTHER RESPIRATORY from 03/07/2022 in [place]  Date 02/07/22  Educator handout  Instruction Review Code 1- Verbalizes Understanding       Nutrition II: Labels: -Discuss the different components of food labels and how to read food labels. Flowsheet Row PULMONARY REHAB OTHER RESPIRATORY from 03/07/2022 in [place]  Date 02/21/22  Educator HB  Instruction Review Code 1- Verbalizes Understanding       Respiratory Infections: - Discuss the signs and symptoms of respiratory infections, ways to prevent respiratory infections, and the importance of seeking medical treatment when having a respiratory infection.   Stress I: Signs and Symptoms: - Discuss the causes of stress, how stress may lead to anxiety and depression, and ways to limit stress. Flowsheet Row PULMONARY REHAB OTHER RESPIRATORY from 03/07/2022 in [place]  Date 03/07/22  Educator DF  Instruction Review Code 2- Demonstrated Understanding        Stress II: Relaxation: -Discuss relaxation techniques to limit stress. Flowsheet Row PULMONARY REHAB OTHER RESPIRATORY from 03/07/2022 in [place]  Date 12/13/21  Educator DM  Instruction Review Code 1- Verbalizes Understanding       Oxygen for Home/Travel: - Discuss how to prepare for travel when on oxygen and proper ways to transport and store oxygen to ensure safety. Flowsheet Row PULMONARY REHAB OTHER RESPIRATORY from 03/07/2022 in [place]  Date 12/20/21  Educator DF  Instruction Review Code 1- Verbalizes Understanding       Knowledge Questionnaire Score:  Knowledge Questionnaire Score - 12/06/21 1248       Knowledge Questionnaire Score   Pre Score 14/18             Core Components/Risk Factors/Patient Goals at Admission:  Personal Goals and Risk Factors at Admission - 12/06/21 1345       Core Components/Risk Factors/Patient Goals on Admission    Weight Management Obesity;Yes    Intervention Weight Management: Provide education and appropriate resources to help participant work on and attain dietary goals.;Weight Management/Obesity: Establish reasonable short term and long term weight goals.;Obesity: Provide education and appropriate resources to help participant work on and attain dietary goals.    Admit Weight 264 lb 4.8 oz (119.9 kg)    Expected Outcomes Weight Gain: Understanding of general recommendations for a high calorie, high protein meal plan that promotes weight gain by distributing calorie intake throughout the day with the consumption for 4-5 meals, snacks, and/or supplements    Improve shortness of breath with ADL's Yes  Intervention Provide education, individualized exercise plan and daily activity instruction to help decrease symptoms of SOB with activities of daily living.    Expected Outcomes Short Term: Improve cardiorespiratory fitness to achieve a reduction of symptoms when performing  ADLs;Long Term: Be able to perform more ADLs without symptoms or delay the onset of symptoms    Personal Goal Other Yes    Personal Goal Patient wants to have more energy build muscles and lose weight.    Intervention Patient will attend PR 2 days/week with exercise and education and supplement with exercise at home.    Expected Outcomes Patient will complete the program meeting both personal and program goals.             Core Components/Risk Factors/Patient Goals Review:   Goals and Risk Factor Review     Row [name] 12/26/21 1253 12/26/21 1254 01/22/22 0916 02/19/22 0823 03/15/22 1008     Core Components/Risk Factors/Patient Goals Review   Personal Goals Review Weight Management/Obesity;Stress -- Weight Management/Obesity;Stress Weight Management/Obesity;Stress Weight Management/Obesity;Stress   Review -- has completed 5 session in PR.  Tolerating program well.  Exercises without O2 and Sats are running93-95% on room air.  Socializing well with staff and other clients.  Vital signs has been stable.  Pt has been maintaining her weight thus far. Amelita has completed 11 session in PR.  Tolerating program well.  Exercises without O2 and Sats are running 91-95% on room air.  Socializing well with staff and other clients.  Vital signs has been within guidelines.  Pt has been maintaining her weight thus far.  CT scan (chest) done on 01/04/2022, showing improvments from previous CT scan done in 2019. Paula [name] has completed 17 session in PR.  Tolerating program well.  Exercises without O2 and Sats are running 91-95% on room air.  Socializing well with staff and other clients.  Vital signs has been within guidelines.  Pt has been maintaining her weight thus far.  CT scan (chest) done on 01/04/2022, showing improvments from previous CT scan done in 2019. Paula [name] has completed 22 session in PR.  Tolerating program well.  Exercises without O2 and Sats are running 91-95% on room air.  Socializing well with staff and  other clients.  Vital signs has been within guidelines.  Pt has been maintaining her weight thus far.   Expected Outcomes -- Plan of care is ongoing.  No further concerns as of present. Plan of care is ongoing.  No further concerns as of present. Plan of care is ongoing.  No further concerns as of present. Plan of care is ongoing.  No further concerns as of present.            Core Components/Risk Factors/Patient Goals at Discharge (Final Review):   Goals and Risk Factor Review - 03/15/22 1008       Core Components/Risk Factors/Patient Goals Review   Personal Goals Review Weight Management/Obesity;Stress    Review Paula [name] has completed 22 session in PR.  Tolerating program well.  Exercises without O2 and Sats are running 91-95% on room air.  Socializing well with staff and other clients.  Vital signs has been within guidelines.  Pt has been maintaining her weight thus far.    Expected Outcomes Plan of care is ongoing.  No further concerns as of present.             ITP Comments:   Comments: ITP REVIEW Pt is making expected progress toward pulmonary rehab goals after completing 22 sessions.  Recommend continued exercise, life style modification, education, and utilization of breathing techniques to increase stamina and strength and decrease shortness of breath with exertion.

## 2022-03-28 ENCOUNTER — Encounter (HOSPITAL_COMMUNITY): Payer: Medicare Other

## 2022-04-02 ENCOUNTER — Encounter (HOSPITAL_COMMUNITY)
Admission: RE | Admit: 2022-04-02 | Discharge: 2022-04-02 | Disposition: A | Discharge status: Home / Self Care | Payer: Medicare Other | Source: Ambulatory Visit | Attending: Internal Medicine | Admitting: Internal Medicine

## 2022-04-02 DIAGNOSIS — J449 Chronic obstructive pulmonary disease, unspecified: Secondary | ICD-10-CM

## 2022-04-02 NOTE — Progress Notes (Signed)
Daily Session Note  Patient Details  Name: Paula [name] MRN: [id number] Date of Birth: 10/04/1950 Referring Provider:   [facility] from 12/06/2021 in [place]  Referring Provider Dr. Georgann Housekeeper       Encounter Date: 04/02/2022  Check In:  Session Check In - 04/02/22 1024       Check-In   Supervising physician immediately available to respond to emergencies [facility] MD immediately available    Physician(s) Dr Dellia Cloud    Location [facility]    Staff Present Leana Roe, BS, Exercise Physiologist;[place] Ferrick Hassell Done, RN, BSN    Virtual Visit No    Medication changes reported     No    Fall or balance concerns reported    No    Tobacco Cessation No Change    Warm-up and Cool-down Performed as group-led instruction    Resistance Training Performed Yes    VAD Patient? No    PAD/SET Patient? No      Pain Assessment   Currently in Pain? No/denies    Pain Score 0-No pain    Multiple Pain Sites No             Capillary Blood Glucose: No results found for this or any previous visit (from the past 24 hour(s)).    Social History   Tobacco Use  Smoking Status Former   Packs/day: 0.75   Years: 20.00   Total pack years: 15.00   Types: Cigarettes  Smokeless Tobacco Former   Quit date: 10/31/1998    Goals Met:  Proper associated with RPD/PD & O2 Sat Independence with exercise equipment Using PLB without cueing & demonstrates good technique Exercise tolerated well Queuing for purse lip breathing No report of concerns or symptoms today Strength training completed today  Goals Unmet:  Not Applicable  Comments: Checkout at 1145.   Dr. Kathie Dike is Medical Director for [facility].

## 2022-04-04 ENCOUNTER — Encounter (HOSPITAL_COMMUNITY)
Admission: RE | Admit: 2022-04-04 | Discharge: 2022-04-04 | Disposition: A | Discharge status: Home / Self Care | Payer: Medicare Other | Source: Ambulatory Visit | Attending: Internal Medicine | Admitting: Internal Medicine

## 2022-04-04 DIAGNOSIS — J449 Chronic obstructive pulmonary disease, unspecified: Secondary | ICD-10-CM | POA: Diagnosis not present

## 2022-04-04 NOTE — Progress Notes (Signed)
Daily Session Note  Patient Details  Name: Paula [name] MRN: [id number] Date of Birth: Apr 11, 1950 Referring Provider:   Flowsheet Row PULMONARY REHAB OTHER RESP ORIENTATION from 12/06/2021 in [place]  Referring Provider Dr. Georgann Housekeeper       Encounter Date: 04/04/2022  Check In:  Session Check In - 04/04/22 1043       Check-In   Supervising physician immediately available to respond to emergencies [facility] MD immediately available    Physician(s) Dr Dellia Cloud    Location [facility]    Staff Present Leana Roe, BS, Exercise Physiologist;Edman Lipsey BSN, RN    Virtual Visit No    Medication changes reported     No    Fall or balance concerns reported    No    Tobacco Cessation No Change    Warm-up and Cool-down Performed as group-led instruction    Resistance Training Performed Yes    VAD Patient? No    PAD/SET Patient? No      Pain Assessment   Currently in Pain? No/denies    Pain Score 0-No pain    Multiple Pain Sites No             Capillary Blood Glucose: No results found for this or any previous visit (from the past 24 hour(s)).    Social History   Tobacco Use  Smoking Status Former   Packs/day: 0.75   Years: 20.00   Total pack years: 15.00   Types: Cigarettes  Smokeless Tobacco Former   Quit date: 10/31/1998    Goals Met:  Proper associated with RPD/PD & O2 Sat Independence with exercise equipment Using PLB without cueing & demonstrates good technique Exercise tolerated well Queuing for purse lip breathing No report of concerns or symptoms today Strength training completed today  Goals Unmet:  Not Applicable  Comments: check out at 11:45   Dr. Kathie Dike is Medical Director for [facility] Re[facility].

## 2022-04-09 ENCOUNTER — Encounter (HOSPITAL_COMMUNITY): Payer: Medicare Other

## 2022-04-11 ENCOUNTER — Encounter (HOSPITAL_COMMUNITY): Payer: Medicare Other

## 2022-04-16 ENCOUNTER — Encounter (HOSPITAL_COMMUNITY)
Admission: RE | Admit: 2022-04-16 | Discharge: 2022-04-16 | Disposition: A | Discharge status: Home / Self Care | Payer: Medicare Other | Source: Ambulatory Visit | Attending: Internal Medicine | Admitting: Internal Medicine

## 2022-04-16 DIAGNOSIS — J449 Chronic obstructive pulmonary disease, unspecified: Secondary | ICD-10-CM

## 2022-04-16 NOTE — Progress Notes (Signed)
Daily Session Note  Patient Details  Name: Paula [name] MRN: [id number] Date of Birth: Nov 10, 1950 Referring Provider:   [place] from 12/06/2021 in [place]  Referring Provider Dr. Georgann Housekeeper       Encounter Date: 04/16/2022  Check In:  Session Check In - 04/16/22 1038       Check-In   Supervising physician immediately available to respond to emergencies [facility] MD immediately available    Physician(s) Dr Domenic Polite    Location [facility]    Staff Present Leana Roe, BS, Exercise Physiologist;[place] Sherrie George, MS, ACSM-CEP    Virtual Visit No    Medication changes reported     No    Fall or balance concerns reported    No    Tobacco Cessation No Change    Warm-up and Cool-down Performed as group-led instruction    Resistance Training Performed Yes    VAD Patient? No    PAD/SET Patient? No      Pain Assessment   Currently in Pain? No/denies    Multiple Pain Sites No             Capillary Blood Glucose: No results found for this or any previous visit (from the past 24 hour(s)).    Social History   Tobacco Use  Smoking Status Former   Packs/day: 0.75   Years: 20.00   Total pack years: 15.00   Types: Cigarettes  Smokeless Tobacco Former   Quit date: 10/31/1998    Goals Met:  Proper associated with RPD/PD & O2 Sat Independence with exercise equipment Using PLB without cueing & demonstrates good technique Exercise tolerated well Queuing for purse lip breathing No report of concerns or symptoms today Strength training completed today  Goals Unmet:  Not Applicable  Comments: Checkout at 1145.   Dr. Kathie Dike is Medical Director for [facility] Pulmonary Re[facility].

## 2022-04-18 ENCOUNTER — Encounter (HOSPITAL_COMMUNITY)
Admission: RE | Admit: 2022-04-18 | Discharge: 2022-04-18 | Disposition: A | Discharge status: Home / Self Care | Payer: Medicare Other | Source: Ambulatory Visit | Attending: Internal Medicine | Admitting: Internal Medicine

## 2022-04-18 DIAGNOSIS — J449 Chronic obstructive pulmonary disease, unspecified: Secondary | ICD-10-CM

## 2022-04-18 NOTE — Progress Notes (Signed)
Daily Session Note  Patient Details  Name: Paula [name] MRN: [id number] Date of Birth: 05/18/1950 Referring Provider:   Flowsheet Row PULMONARY REHAB OTHER RESP ORIENTATION from 12/06/2021 in [place]  Referring Provider Dr. Georgann Housekeeper       Encounter Date: 04/18/2022  Check In:  Session Check In - 04/18/22 1045       Check-In   Supervising physician immediately available to respond to emergencies [facility] MD immediately available    Physician(s) Dr Domenic Polite    Location [facility]    Staff Present Leana Roe, BS, Exercise Physiologist;Mathan Darroch BSN, RN    Virtual Visit No    Medication changes reported     No    Fall or balance concerns reported    No    Tobacco Cessation No Change    Warm-up and Cool-down Performed as group-led instruction    Resistance Training Performed Yes    VAD Patient? No    PAD/SET Patient? No      Pain Assessment   Currently in Pain? No/denies    Pain Score 0-No pain    Multiple Pain Sites No             Capillary Blood Glucose: No results found for this or any previous visit (from the past 24 hour(s)).    Social History   Tobacco Use  Smoking Status Former   Packs/day: 0.75   Years: 20.00   Total pack years: 15.00   Types: Cigarettes  Smokeless Tobacco Former   Quit date: 10/31/1998    Goals Met:  Proper associated with RPD/PD & O2 Sat Independence with exercise equipment Using PLB without cueing & demonstrates good technique Exercise tolerated well Queuing for purse lip breathing No report of concerns or symptoms today Strength training completed today  Goals Unmet:  Not Applicable  Comments: check out at 11:45   Dr. Kathie Dike is Medical Director for [facility] Re[facility].

## 2022-04-23 ENCOUNTER — Encounter (HOSPITAL_COMMUNITY)
Admission: RE | Admit: 2022-04-23 | Discharge: 2022-04-23 | Disposition: A | Discharge status: Home / Self Care | Payer: Medicare Other | Source: Ambulatory Visit | Attending: Internal Medicine | Admitting: Internal Medicine

## 2022-04-23 DIAGNOSIS — J449 Chronic obstructive pulmonary disease, unspecified: Secondary | ICD-10-CM | POA: Diagnosis not present

## 2022-04-23 NOTE — Progress Notes (Signed)
Daily Session Note  Patient Details  Name: Paula [name] MRN: [id number] Date of Birth: 1950/04/12 Referring Provider:   [place] from 12/06/2021 in [place]  Referring Provider Dr. Georgann Housekeeper       Encounter Date: 04/23/2022  Check In:  Session Check In - 04/23/22 1045       Check-In   Supervising physician immediately available to respond to emergencies [facility] MD immediately available    Physician(s) Dr Dellia Cloud    Location [facility]    Staff Present Leana Roe, BS, Exercise Physiologist;Ryleigh Buenger Hassell Done, RN, BSN    Virtual Visit No    Medication changes reported     No    Fall or balance concerns reported    No    Tobacco Cessation No Change    Warm-up and Cool-down Performed as group-led instruction    Resistance Training Performed Yes    VAD Patient? No    PAD/SET Patient? No      Pain Assessment   Currently in Pain? No/denies    Pain Score 0-No pain    Multiple Pain Sites No             Capillary Blood Glucose: No results found for this or any previous visit (from the past 24 hour(s)).    Social History   Tobacco Use  Smoking Status Former   Packs/day: 0.75   Years: 20.00   Total pack years: 15.00   Types: Cigarettes  Smokeless Tobacco Former   Quit date: 10/31/1998    Goals Met:  Proper associated with RPD/PD & O2 Sat Independence with exercise equipment Using PLB without cueing & demonstrates good technique Exercise tolerated well Queuing for purse lip breathing No report of concerns or symptoms today Strength training completed today  Goals Unmet:  Not Applicable  Comments: Checkout at 1145.   Dr. Kathie Dike is Medical Director for [facility] Pulmonary Rehab.

## 2022-04-24 NOTE — Progress Notes (Signed)
Pulmonary Individual Treatment Plan  Patient Details  Name: Paula [name] MRN: [id number] Date of Birth: Oct 23, 1950 Referring Provider:   [place] from 12/06/2021 in [place]  Referring Provider Dr. Georgann Housekeeper       Initial Encounter Date:  Flowsheet Row PULMONARY REHAB OTHER RESP ORIENTATION from 12/06/2021 in [place]  Date 12/06/21       Visit Diagnosis: Chronic obstructive pulmonary disease, unspecified COPD type ([place])  Patient's Home Medications on Admission:   Current Outpatient Medications:    acetaminophen (TYLENOL) 500 MG tablet, Take 500-1,000 mg by mouth every 6 (six) hours as needed (pain.)., Disp: , Rfl:    albuterol (VENTOLIN HFA) 108 (90 Base) MCG/ACT inhaler, Inhale 2 puffs into the lungs every 6 (six) hours as needed for wheezing or shortness of breath., Disp: , Rfl:    azelastine (ASTELIN) 0.1 % nasal spray, Place 2 sprays into both nostrils 2 (two) times daily as needed for allergies. Use in each nostril as directed, Disp: , Rfl:    budesonide-formoterol (SYMBICORT) 160-4.5 MCG/ACT inhaler, Inhale 2 puffs into the lungs 2 (two) times daily., Disp: , Rfl:    Cholecalciferol (VITAMIN D) 125 MCG (5000 UT) CAPS, Take 5,000 Units by mouth in the morning., Disp: , Rfl:    clotrimazole-betamethasone (LOTRISONE) cream, Apply 1 application topically 2 (two) times daily as needed (skin irritation.). , Disp: , Rfl:    diclofenac (VOLTAREN) 75 MG EC tablet, Take 75 mg by mouth daily as needed (arthritis pain)., Disp: , Rfl:    ezetimibe (ZETIA) 10 MG tablet, Take 10 mg by mouth in the morning., Disp: , Rfl:    famotidine (PEPCID) 40 MG tablet, Take 40 mg by mouth at bedtime., Disp: , Rfl:    fluticasone (FLONASE) 50 MCG/ACT nasal spray, Place 2 sprays into both nostrils daily as needed for allergies., Disp: , Rfl:    guaiFENesin (MUCINEX) 600 MG 12 hr tablet, Take 600 mg by mouth daily as  needed (congestion/COPD issues)., Disp: , Rfl:    hydrochlorothiazide (HYDRODIURIL) 25 MG tablet, Take 25 mg by mouth in the morning., Disp: , Rfl:    Ketotifen Fumarate (ALLERGY EYE DROPS OP), Place 1 drop into both eyes 2 (two) times daily as needed (allergy eyes)., Disp: , Rfl:    loratadine (CLARITIN) 10 MG tablet, Take 10 mg by mouth daily as needed for allergies., Disp: , Rfl:    losartan (COZAAR) 100 MG tablet, Take 100 mg by mouth in the morning., Disp: , Rfl:    montelukast (SINGULAIR) 10 MG tablet, Take 10 mg by mouth at bedtime., Disp: , Rfl:    Multiple Vitamins-Minerals (PRESERVISION AREDS 2 PO), Take 1 tablet by mouth in the morning and at bedtime., Disp: , Rfl:    NON FORMULARY, CPAP, O2 at night, Disp: , Rfl:    omeprazole (PRILOSEC) 40 MG capsule, Take 40 mg by mouth daily before breakfast., Disp: , Rfl:    sertraline (ZOLOFT) 100 MG tablet, Take 100 mg by mouth at bedtime., Disp: , Rfl:    terbinafine (LAMISIL) 250 MG tablet, Take 250 mg by mouth in the morning., Disp: , Rfl:   Past Medical History: Past Medical History:  Diagnosis Date   Anxiety    Arthritis    COPD (chronic obstructive pulmonary disease) ([place])    Heart murmur    Hypertension    Lung nodule    LLL   Sleep apnea    CPAP  with O2 2 liters    Tubal ectopic pregnancy 1970   Vitelliform macular dystrophy     Tobacco Use: Social History   Tobacco Use  Smoking Status Former   Packs/day: 0.75   Years: 20.00   Total pack years: 15.00   Types: Cigarettes  Smokeless Tobacco Former   Quit date: 10/31/1998    Labs: Review Flowsheet        No data to display          Capillary Blood Glucose: No results found for: "GLUCAP"   Pulmonary Assessment Scores:  Pulmonary Assessment Scores     Row Name 12/06/21 1250         ADL UCSD   ADL Phase Entry     SOB Score total 33     Rest 0     Walk 3     Stairs 4     Bath 0     Dress 1     Shop 2       CAT Score   CAT Score 11       mMRC  Score   mMRC Score 2             UCSD: Self-administered rating of dyspnea associated with activities of daily living (ADLs) 6-point scale (0 = "not at all" to 5 = "maximal or unable to do because of breathlessness")  Scoring Scores range from 0 to 120.  Minimally important difference is 5 units  CAT: CAT can identify the health impairment of COPD patients and is better correlated with disease progression.  CAT has a scoring range of zero to 40. The CAT score is classified into four groups of low (less than 10), medium (10 - 20), high (21-30) and very high (31-40) based on the impact level of disease on health status. A CAT score over 10 suggests significant symptoms.  A worsening CAT score could be explained by an exacerbation, poor medication adherence, poor inhaler technique, or progression of COPD or comorbid conditions.  CAT MCID is 2 points  mMRC: mMRC (Modified Medical Research Council) Dyspnea Scale is used to assess the degree of baseline functional disability in patients of respiratory disease due to dyspnea. No minimal important difference is established. A decrease in score of 1 point or greater is considered a positive change.   Pulmonary Function Assessment:   Exercise Target Goals: Exercise Program Goal: Individual exercise prescription set using results from initial 6 min walk test and THRR while considering  patient's activity barriers and safety.   Exercise Prescription Goal: Initial exercise prescription builds to 30-45 minutes a day of aerobic activity, 2-3 days per week.  Home exercise guidelines will be given to patient during program as part of exercise prescription that the participant will acknowledge.  Activity Barriers & Risk Stratification:  Activity Barriers & Cardiac Risk Stratification - 12/06/21 1313       Activity Barriers & Cardiac Risk Stratification   Activity Barriers Arthritis;Shortness of Breath    Cardiac Risk Stratification Low              6 Minute Walk:  6 Minute Walk     Row Name 12/06/21 1357         6 Minute Walk   Phase Initial     Distance 800 feet     Walk Time 6 minutes     # of Rest Breaks 2     MPH 1.51     METS 1.11  RPE 14     Perceived Dyspnea  14     VO2 Peak 3.89     Symptoms Yes (comment)     Comments two standing breaks due to SOB and bilateral hip pain (6/10) 20 & 30 seconds.     Resting HR 73 bpm     Resting BP 134/66     Resting Oxygen Saturation  94 %     Exercise Oxygen Saturation  during 6 min walk 87 %     Max Ex. HR 120 bpm     Max Ex. BP 160/66     2 Minute Post BP 140/66       Interval HR   1 Minute HR 100     2 Minute HR 103     3 Minute HR 103     4 Minute HR 113     5 Minute HR 120     6 Minute HR 115     2 Minute Post HR 76     Interval Heart Rate? Yes       Interval Oxygen   Interval Oxygen? Yes     Baseline Oxygen Saturation % 94 %     1 Minute Oxygen Saturation % 87 %     1 Minute Liters of Oxygen 0 L     2 Minute Oxygen Saturation % 88 %     2 Minute Liters of Oxygen 0 L     3 Minute Oxygen Saturation % 90 %     3 Minute Liters of Oxygen 0 L     4 Minute Oxygen Saturation % 87 %     4 Minute Liters of Oxygen 0 L     5 Minute Oxygen Saturation % 87 %     5 Minute Liters of Oxygen 0 L     6 Minute Oxygen Saturation % 87 %     6 Minute Liters of Oxygen 0 L     2 Minute Post Oxygen Saturation % 95 %     2 Minute Post Liters of Oxygen 0 L              Oxygen Initial Assessment:  Oxygen Initial Assessment - 12/06/21 1249       Home Oxygen   Home Oxygen Device Home Concentrator    Sleep Oxygen Prescription Continuous;CPAP    Liters per minute 2    Home Exercise Oxygen Prescription None    Home Resting Oxygen Prescription None    Compliance with Home Oxygen Use Yes      Initial 6 min Walk   Oxygen Used None      Program Oxygen Prescription   Program Oxygen Prescription None      Intervention   Short Term Goals To learn and exhibit  compliance with exercise, home and travel O2 prescription;To learn and understand importance of monitoring SPO2 with pulse oximeter and demonstrate accurate use of the pulse oximeter.;To learn and understand importance of maintaining oxygen saturations>88%;To learn and demonstrate proper pursed lip breathing techniques or other breathing techniques.     Long  Term Goals Exhibits compliance with exercise, home  and travel O2 prescription;Verbalizes importance of monitoring SPO2 with pulse oximeter and return demonstration;Maintenance of O2 saturations>88%;Exhibits proper breathing techniques, such as pursed lip breathing or other method taught during program session             Oxygen Re-Evaluation:  Oxygen Re-Evaluation     Row Name 01/01/22 1344 01/29/22 1334 02/26/22  1303 03/26/22 1014       Program Oxygen Prescription   Program Oxygen Prescription None None None None      Home Oxygen   Home Oxygen Device Home Concentrator Home Concentrator Home Concentrator Home Concentrator    Sleep Oxygen Prescription Continuous;CPAP Continuous;CPAP Continuous;CPAP Continuous;CPAP    Liters per minute '2 2 2 2    '$ Home Exercise Oxygen Prescription None None None None    Home Resting Oxygen Prescription None None None None    Compliance with Home Oxygen Use Yes Yes Yes Yes      Goals/Expected Outcomes   Short Term Goals To learn and exhibit compliance with exercise, home and travel O2 prescription;To learn and understand importance of monitoring SPO2 with pulse oximeter and demonstrate accurate use of the pulse oximeter.;To learn and understand importance of maintaining oxygen saturations>88%;To learn and demonstrate proper pursed lip breathing techniques or other breathing techniques.  To learn and exhibit compliance with exercise, home and travel O2 prescription;To learn and understand importance of monitoring SPO2 with pulse oximeter and demonstrate accurate use of the pulse oximeter.;To learn and  understand importance of maintaining oxygen saturations>88%;To learn and demonstrate proper pursed lip breathing techniques or other breathing techniques.  To learn and exhibit compliance with exercise, home and travel O2 prescription;To learn and understand importance of monitoring SPO2 with pulse oximeter and demonstrate accurate use of the pulse oximeter.;To learn and understand importance of maintaining oxygen saturations>88%;To learn and demonstrate proper pursed lip breathing techniques or other breathing techniques.  To learn and exhibit compliance with exercise, home and travel O2 prescription;To learn and understand importance of monitoring SPO2 with pulse oximeter and demonstrate accurate use of the pulse oximeter.;To learn and understand importance of maintaining oxygen saturations>88%;To learn and demonstrate proper pursed lip breathing techniques or other breathing techniques.     Long  Term Goals Exhibits compliance with exercise, home  and travel O2 prescription;Verbalizes importance of monitoring SPO2 with pulse oximeter and return demonstration;Maintenance of O2 saturations>88%;Exhibits proper breathing techniques, such as pursed lip breathing or other method taught during program session Exhibits compliance with exercise, home  and travel O2 prescription;Verbalizes importance of monitoring SPO2 with pulse oximeter and return demonstration;Maintenance of O2 saturations>88%;Exhibits proper breathing techniques, such as pursed lip breathing or other method taught during program session Exhibits compliance with exercise, home  and travel O2 prescription;Verbalizes importance of monitoring SPO2 with pulse oximeter and return demonstration;Maintenance of O2 saturations>88%;Exhibits proper breathing techniques, such as pursed lip breathing or other method taught during program session Exhibits compliance with exercise, home  and travel O2 prescription;Verbalizes importance of monitoring SPO2 with pulse  oximeter and return demonstration;Maintenance of O2 saturations>88%;Exhibits proper breathing techniques, such as pursed lip breathing or other method taught during program session    Goals/Expected Outcomes compliance compliance compliance compliance             Oxygen Discharge (Final Oxygen Re-Evaluation):  Oxygen Re-Evaluation - 03/26/22 1014       Program Oxygen Prescription   Program Oxygen Prescription None      Home Oxygen   Home Oxygen Device Home Concentrator    Sleep Oxygen Prescription Continuous;CPAP    Liters per minute 2    Home Exercise Oxygen Prescription None    Home Resting Oxygen Prescription None    Compliance with Home Oxygen Use Yes      Goals/Expected Outcomes   Short Term Goals To learn and exhibit compliance with exercise, home and travel O2 prescription;To learn and understand importance  of monitoring SPO2 with pulse oximeter and demonstrate accurate use of the pulse oximeter.;To learn and understand importance of maintaining oxygen saturations>88%;To learn and demonstrate proper pursed lip breathing techniques or other breathing techniques.     Long  Term Goals Exhibits compliance with exercise, home  and travel O2 prescription;Verbalizes importance of monitoring SPO2 with pulse oximeter and return demonstration;Maintenance of O2 saturations>88%;Exhibits proper breathing techniques, such as pursed lip breathing or other method taught during program session    Goals/Expected Outcomes compliance             Initial Exercise Prescription:  Initial Exercise Prescription - 12/06/21 1400       Date of Initial Exercise RX and Referring Provider   Date 12/06/21    Referring Provider Dr. Georgann Housekeeper    Expected Discharge Date 04/11/22      NuStep   Level 1    SPM 60    Minutes 22      Arm Ergometer   Level 1    RPM 45    Minutes 17      Prescription Details   Frequency (times per week) 2    Duration Progress to 30 minutes of continuous aerobic  without signs/symptoms of physical distress      Intensity   THRR 40-80% of Max Heartrate 52-118    Ratings of Perceived Exertion 11-13    Perceived Dyspnea 0-4      Resistance Training   Training Prescription Yes    Weight 2    Reps 10-15             Perform Capillary Blood Glucose checks as needed.  Exercise Prescription Changes:   Exercise Prescription Changes     Row Name 12/20/21 1200 01/01/22 1300 01/15/22 1200 01/29/22 1300 02/12/22 1200     Response to Exercise   Blood Pressure (Admit) 132/76 128/60 144/64 128/72 122/72   Blood Pressure (Exercise) 160/78 142/70 160/70 160/72 140/76   Blood Pressure (Exit) 118/64 126/64 124/72 138/90 160/80   Heart Rate (Admit) 70 bpm 76 bpm 82 bpm 95 bpm 83 bpm   Heart Rate (Exercise) 79 bpm 85 bpm 90 bpm 95 bpm 90 bpm   Heart Rate (Exit) 64 bpm 70 bpm 72 bpm 83 bpm 82 bpm   Oxygen Saturation (Admit) 94 % 95 % 95 % 93 % 94 %   Oxygen Saturation (Exercise) 93 % 92 % 94 % 92 % 93 %   Oxygen Saturation (Exit) 95 % 95 % 94 % 95 % 96 %   Rating of Perceived Exertion (Exercise) '12 12 12 12 12   '$ Perceived Dyspnea (Exercise) '12 12 12 12 12   '$ Duration Continue with 30 min of aerobic exercise without signs/symptoms of physical distress. Continue with 30 min of aerobic exercise without signs/symptoms of physical distress. Continue with 30 min of aerobic exercise without signs/symptoms of physical distress. Continue with 30 min of aerobic exercise without signs/symptoms of physical distress. Continue with 30 min of aerobic exercise without signs/symptoms of physical distress.   Intensity THRR unchanged THRR unchanged THRR unchanged THRR unchanged THRR unchanged     Progression   Progression Continue to progress workloads to maintain intensity without signs/symptoms of physical distress. Continue to progress workloads to maintain intensity without signs/symptoms of physical distress. Continue to progress workloads to maintain intensity without  signs/symptoms of physical distress. Continue to progress workloads to maintain intensity without signs/symptoms of physical distress. Continue to progress workloads to maintain intensity without signs/symptoms of physical distress.  Resistance Training   Training Prescription Yes Yes Yes Yes Yes   Weight '2 3 3 3 3   '$ Reps 10-15 10-15 10-15 10-15 10-15   Time 10 Minutes 10 Minutes 10 Minutes 10 Minutes 10 Minutes     NuStep   Level '1 2 2 2 2   '$ SPM 91 102 107 106 109   Minutes '22 22 22 22 22   '$ METs 2 2 2.1 1.9 1.9     Arm Ergometer   Level '1 1 2 2 2   '$ RPM 64 70 66 69 64   Minutes '17 17 17 17 17   '$ METs 1.6 1.7 2.1 2.2 2    Row Name 02/26/22 1200 03/07/22 1200 04/04/22 1147         Response to Exercise   Blood Pressure (Admit) 132/78 140/70 136/70     Blood Pressure (Exercise) 148/84 144/72 150/80     Blood Pressure (Exit) 138/70 122/68 130/70     Heart Rate (Admit) 82 bpm 83 bpm 87 bpm     Heart Rate (Exercise) 89 bpm 88 bpm 91 bpm     Heart Rate (Exit) 79 bpm 74 bpm 71 bpm     Oxygen Saturation (Admit) 94 % 91 % 91 %     Oxygen Saturation (Exercise) 93 % 93 % 92 %     Oxygen Saturation (Exit) 94 % 96 % 92 %     Rating of Perceived Exertion (Exercise) '13 13 12     '$ Perceived Dyspnea (Exercise) '12 13 13     '$ Duration Continue with 30 min of aerobic exercise without signs/symptoms of physical distress. Continue with 30 min of aerobic exercise without signs/symptoms of physical distress. Continue with 30 min of aerobic exercise without signs/symptoms of physical distress.     Intensity THRR unchanged THRR unchanged THRR unchanged       Progression   Progression Continue to progress workloads to maintain intensity without signs/symptoms of physical distress. Continue to progress workloads to maintain intensity without signs/symptoms of physical distress. Continue to progress workloads to maintain intensity without signs/symptoms of physical distress.       Resistance Training    Training Prescription Yes Yes Yes     Weight '3 3 3     '$ Reps 10-15 10-15 10-15     Time 10 Minutes 10 Minutes 10 Minutes       NuStep   Level '2 3 3     '$ SPM 114 107 124     Minutes '22 22 22     '$ METs 2 2 2.3       Arm Ergometer   Level '2 2 2     '$ RPM 68 65 59     Minutes '17 17 17     '$ METs 2.1 2.1 2              Exercise Comments:   Exercise Goals and Review:   Exercise Goals     Row Name 12/06/21 1402 01/01/22 1342 01/29/22 1328 02/26/22 1259 03/26/22 1011     Exercise Goals   Increase Physical Activity Yes Yes Yes Yes Yes   Intervention Provide advice, education, support and counseling about physical activity/exercise needs.;Develop an individualized exercise prescription for aerobic and resistive training based on initial evaluation findings, risk stratification, comorbidities and participant's personal goals. Provide advice, education, support and counseling about physical activity/exercise needs.;Develop an individualized exercise prescription for aerobic and resistive training based on initial evaluation findings, risk stratification, comorbidities and  participant's personal goals. Provide advice, education, support and counseling about physical activity/exercise needs.;Develop an individualized exercise prescription for aerobic and resistive training based on initial evaluation findings, risk stratification, comorbidities and participant's personal goals. Provide advice, education, support and counseling about physical activity/exercise needs.;Develop an individualized exercise prescription for aerobic and resistive training based on initial evaluation findings, risk stratification, comorbidities and participant's personal goals. Provide advice, education, support and counseling about physical activity/exercise needs.;Develop an individualized exercise prescription for aerobic and resistive training based on initial evaluation findings, risk stratification, comorbidities and  participant's personal goals.   Expected Outcomes Short Term: Attend rehab on a regular basis to increase amount of physical activity.;Long Term: Add in home exercise to make exercise part of routine and to increase amount of physical activity.;Long Term: Exercising regularly at least 3-5 days a week. Short Term: Attend rehab on a regular basis to increase amount of physical activity.;Long Term: Add in home exercise to make exercise part of routine and to increase amount of physical activity.;Long Term: Exercising regularly at least 3-5 days a week. Short Term: Attend rehab on a regular basis to increase amount of physical activity.;Long Term: Add in home exercise to make exercise part of routine and to increase amount of physical activity.;Long Term: Exercising regularly at least 3-5 days a week. Short Term: Attend rehab on a regular basis to increase amount of physical activity.;Long Term: Add in home exercise to make exercise part of routine and to increase amount of physical activity.;Long Term: Exercising regularly at least 3-5 days a week. Short Term: Attend rehab on a regular basis to increase amount of physical activity.;Long Term: Add in home exercise to make exercise part of routine and to increase amount of physical activity.;Long Term: Exercising regularly at least 3-5 days a week.   Increase Strength and Stamina Yes Yes Yes Yes Yes   Intervention Provide advice, education, support and counseling about physical activity/exercise needs.;Develop an individualized exercise prescription for aerobic and resistive training based on initial evaluation findings, risk stratification, comorbidities and participant's personal goals. Provide advice, education, support and counseling about physical activity/exercise needs.;Develop an individualized exercise prescription for aerobic and resistive training based on initial evaluation findings, risk stratification, comorbidities and participant's personal goals.  Provide advice, education, support and counseling about physical activity/exercise needs.;Develop an individualized exercise prescription for aerobic and resistive training based on initial evaluation findings, risk stratification, comorbidities and participant's personal goals. Provide advice, education, support and counseling about physical activity/exercise needs.;Develop an individualized exercise prescription for aerobic and resistive training based on initial evaluation findings, risk stratification, comorbidities and participant's personal goals. Provide advice, education, support and counseling about physical activity/exercise needs.;Develop an individualized exercise prescription for aerobic and resistive training based on initial evaluation findings, risk stratification, comorbidities and participant's personal goals.   Expected Outcomes Short Term: Increase workloads from initial exercise prescription for resistance, speed, and METs.;Short Term: Perform resistance training exercises routinely during rehab and add in resistance training at home;Long Term: Improve cardiorespiratory fitness, muscular endurance and strength as measured by increased METs and functional capacity (6MWT) Short Term: Increase workloads from initial exercise prescription for resistance, speed, and METs.;Short Term: Perform resistance training exercises routinely during rehab and add in resistance training at home;Long Term: Improve cardiorespiratory fitness, muscular endurance and strength as measured by increased METs and functional capacity (6MWT) Short Term: Increase workloads from initial exercise prescription for resistance, speed, and METs.;Short Term: Perform resistance training exercises routinely during rehab and add in resistance training at home;Long Term: Improve  cardiorespiratory fitness, muscular endurance and strength as measured by increased METs and functional capacity (6MWT) Short Term: Increase workloads from  initial exercise prescription for resistance, speed, and METs.;Short Term: Perform resistance training exercises routinely during rehab and add in resistance training at home;Long Term: Improve cardiorespiratory fitness, muscular endurance and strength as measured by increased METs and functional capacity (6MWT) Short Term: Increase workloads from initial exercise prescription for resistance, speed, and METs.;Short Term: Perform resistance training exercises routinely during rehab and add in resistance training at home;Long Term: Improve cardiorespiratory fitness, muscular endurance and strength as measured by increased METs and functional capacity (6MWT)   Able to understand and use rate of perceived exertion (RPE) scale Yes Yes Yes Yes Yes   Intervention Provide education and explanation on how to use RPE scale Provide education and explanation on how to use RPE scale Provide education and explanation on how to use RPE scale Provide education and explanation on how to use RPE scale Provide education and explanation on how to use RPE scale   Expected Outcomes Short Term: Able to use RPE daily in rehab to express subjective intensity level;Long Term:  Able to use RPE to guide intensity level when exercising independently Short Term: Able to use RPE daily in rehab to express subjective intensity level;Long Term:  Able to use RPE to guide intensity level when exercising independently Short Term: Able to use RPE daily in rehab to express subjective intensity level;Long Term:  Able to use RPE to guide intensity level when exercising independently Short Term: Able to use RPE daily in rehab to express subjective intensity level;Long Term:  Able to use RPE to guide intensity level when exercising independently Short Term: Able to use RPE daily in rehab to express subjective intensity level;Long Term:  Able to use RPE to guide intensity level when exercising independently   Able to understand and use Dyspnea scale Yes  Yes Yes Yes Yes   Intervention Provide education and explanation on how to use Dyspnea scale Provide education and explanation on how to use Dyspnea scale Provide education and explanation on how to use Dyspnea scale Provide education and explanation on how to use Dyspnea scale Provide education and explanation on how to use Dyspnea scale   Expected Outcomes Short Term: Able to use Dyspnea scale daily in rehab to express subjective sense of shortness of breath during exertion;Long Term: Able to use Dyspnea scale to guide intensity level when exercising independently Short Term: Able to use Dyspnea scale daily in rehab to express subjective sense of shortness of breath during exertion;Long Term: Able to use Dyspnea scale to guide intensity level when exercising independently Short Term: Able to use Dyspnea scale daily in rehab to express subjective sense of shortness of breath during exertion;Long Term: Able to use Dyspnea scale to guide intensity level when exercising independently Short Term: Able to use Dyspnea scale daily in rehab to express subjective sense of shortness of breath during exertion;Long Term: Able to use Dyspnea scale to guide intensity level when exercising independently Short Term: Able to use Dyspnea scale daily in rehab to express subjective sense of shortness of breath during exertion;Long Term: Able to use Dyspnea scale to guide intensity level when exercising independently   Knowledge and understanding of Target Heart Rate Range (THRR) Yes Yes Yes Yes Yes   Intervention Provide education and explanation of THRR including how the numbers were predicted and where they are located for reference Provide education and explanation of THRR including how the  numbers were predicted and where they are located for reference Provide education and explanation of THRR including how the numbers were predicted and where they are located for reference Provide education and explanation of THRR including  how the numbers were predicted and where they are located for reference Provide education and explanation of THRR including how the numbers were predicted and where they are located for reference   Expected Outcomes Short Term: Able to state/look up THRR;Short Term: Able to use daily as guideline for intensity in rehab;Long Term: Able to use THRR to govern intensity when exercising independently Short Term: Able to state/look up THRR;Short Term: Able to use daily as guideline for intensity in rehab;Long Term: Able to use THRR to govern intensity when exercising independently Short Term: Able to state/look up THRR;Short Term: Able to use daily as guideline for intensity in rehab;Long Term: Able to use THRR to govern intensity when exercising independently Short Term: Able to state/look up THRR;Short Term: Able to use daily as guideline for intensity in rehab;Long Term: Able to use THRR to govern intensity when exercising independently Short Term: Able to state/look up THRR;Short Term: Able to use daily as guideline for intensity in rehab;Long Term: Able to use THRR to govern intensity when exercising independently   Able to check pulse independently Yes Yes -- -- Yes   Intervention Provide education and demonstration on how to check pulse in carotid and radial arteries.;Review the importance of being able to check your own pulse for safety during independent exercise Provide education and demonstration on how to check pulse in carotid and radial arteries.;Review the importance of being able to check your own pulse for safety during independent exercise Provide education and demonstration on how to check pulse in carotid and radial arteries.;Review the importance of being able to check your own pulse for safety during independent exercise -- Provide education and demonstration on how to check pulse in carotid and radial arteries.;Review the importance of being able to check your own pulse for safety during  independent exercise   Expected Outcomes Short Term: Able to explain why pulse checking is important during independent exercise;Long Term: Able to check pulse independently and accurately Short Term: Able to explain why pulse checking is important during independent exercise;Long Term: Able to check pulse independently and accurately Short Term: Able to explain why pulse checking is important during independent exercise;Long Term: Able to check pulse independently and accurately -- Short Term: Able to explain why pulse checking is important during independent exercise;Long Term: Able to check pulse independently and accurately   Understanding of Exercise Prescription Yes Yes Yes Yes Yes   Intervention Provide education, explanation, and written materials on patient's individual exercise prescription Provide education, explanation, and written materials on patient's individual exercise prescription Provide education, explanation, and written materials on patient's individual exercise prescription Provide education, explanation, and written materials on patient's individual exercise prescription Provide education, explanation, and written materials on patient's individual exercise prescription   Expected Outcomes Short Term: Able to explain program exercise prescription;Long Term: Able to explain home exercise prescription to exercise independently Short Term: Able to explain program exercise prescription;Long Term: Able to explain home exercise prescription to exercise independently Short Term: Able to explain program exercise prescription;Long Term: Able to explain home exercise prescription to exercise independently Short Term: Able to explain program exercise prescription;Long Term: Able to explain home exercise prescription to exercise independently Short Term: Able to explain program exercise prescription;Long Term: Able to explain home exercise prescription to  exercise independently             Exercise Goals Re-Evaluation :  Exercise Goals Re-Evaluation     Row Name 01/01/22 1342 01/29/22 1329 01/30/22 0731 02/26/22 1300 03/26/22 1012     Exercise Goal Re-Evaluation   Exercise Goals Review Increase Physical Activity;Increase Strength and Stamina;Able to understand and use rate of perceived exertion (RPE) scale;Able to understand and use Dyspnea scale;Knowledge and understanding of Target Heart Rate Range (THRR);Understanding of Exercise Prescription Increase Physical Activity;Increase Strength and Stamina;Able to understand and use rate of perceived exertion (RPE) scale;Able to understand and use Dyspnea scale;Knowledge and understanding of Target Heart Rate Range (THRR);Understanding of Exercise Prescription -- Increase Physical Activity;Increase Strength and Stamina;Able to understand and use rate of perceived exertion (RPE) scale;Able to understand and use Dyspnea scale;Knowledge and understanding of Target Heart Rate Range (THRR);Understanding of Exercise Prescription Increase Physical Activity;Able to understand and use rate of perceived exertion (RPE) scale;Increase Strength and Stamina;Able to understand and use Dyspnea scale;Knowledge and understanding of Target Heart Rate Range (THRR);Understanding of Exercise Prescription   Comments Pt has completed 7 sessions of PR. She is motivated during class and has increased her levels on the stepper. She has increased her weights and band for the warm up. She is currently exercising at 2.0 METs on the stepper. Will continue to monitor and progress as able. Pt has completed 13 sessions of PR. She continues to increase her workloads on both pieces of equipment. She enjous coming to class each week. She is currently exercising at 2.2 METs on the AE. Will continue to monitor and progress as able. -- Pt has completed19 sessions of PR. She continues to enjoy coming to class and is pushes herself during class. She has not increased her level but  does improve on her SPM/RPM each class. Pt is currently exercising at 2.1 METs on the AE. Will continue to monitor and progress as able. Pt has completed 21 session of PR. She continues to be motivated during class and is pushing herself to increase her SPM and level. Pt is curretnly exercising at 2.2 METs on the stepper. Will continue to montior and progress as able.   Expected Outcomes Through exercise at home and at rehab, the patient will meet their stated goals. Through exercise at home and at rehab, the patient will meet their stated goals. Through exercise at home and at rehab, the patient will meet their stated goals. Through exercise at home and at rehab, the patient will meet their stated goals. Through exercise at home and at rehab, the patient will meet their stated goals.            Discharge Exercise Prescription (Final Exercise Prescription Changes):  Exercise Prescription Changes - 04/04/22 1147       Response to Exercise   Blood Pressure (Admit) 136/70    Blood Pressure (Exercise) 150/80    Blood Pressure (Exit) 130/70    Heart Rate (Admit) 87 bpm    Heart Rate (Exercise) 91 bpm    Heart Rate (Exit) 71 bpm    Oxygen Saturation (Admit) 91 %    Oxygen Saturation (Exercise) 92 %    Oxygen Saturation (Exit) 92 %    Rating of Perceived Exertion (Exercise) 12    Perceived Dyspnea (Exercise) 13    Duration Continue with 30 min of aerobic exercise without signs/symptoms of physical distress.    Intensity THRR unchanged      Progression   Progression Continue to progress workloads  to maintain intensity without signs/symptoms of physical distress.      Resistance Training   Training Prescription Yes    Weight 3    Reps 10-15    Time 10 Minutes      NuStep   Level 3    SPM 124    Minutes 22    METs 2.3      Arm Ergometer   Level 2    RPM 59    Minutes 17    METs 2             Nutrition:  Target Goals: Understanding of nutrition guidelines, daily intake of  sodium '1500mg'$ , cholesterol '200mg'$ , calories 30% from fat and 7% or less from saturated fats, daily to have 5 or more servings of fruits and vegetables.  Biometrics:  Pre Biometrics - 12/06/21 1402       Pre Biometrics   Height 5' (1.524 m)    Weight 119.8 kg    Waist Circumference 54 inches    Hip Circumference 58 inches    Waist to Hip Ratio 0.93 %    BMI (Calculated) 51.58    Triceps Skinfold 45 mm    % Body Fat 63.3 %    Grip Strength 12.3 kg    Flexibility 0 in    Single Leg Stand 0 seconds              Nutrition Therapy Plan and Nutrition Goals:  Nutrition Therapy & Goals - 12/06/21 1246       Personal Nutrition Goals   Comments Patient scored 52 on her diet assessment. Handout provided and explained regarding healthier choices. We offer 2 educational sessions on heart healthy nutrition with handouts and assistance with RD referral if patient is interested.      Intervention Plan   Intervention Nutrition handout(s) given to patient.    Expected Outcomes Short Term Goal: Understand basic principles of dietary content, such as calories, fat, sodium, cholesterol and nutrients.             Nutrition Assessments:  Nutrition Assessments - 12/06/21 1246       MEDFICTS Scores   Pre Score 52            MEDIFICTS Score Key: ?70 Need to make dietary changes  40-70 Heart Healthy Diet ? 40 Therapeutic Level Cholesterol Diet   Picture Your Plate Scores: <81 Unhealthy dietary pattern with much room for improvement. 41-50 Dietary pattern unlikely to meet recommendations for good health and room for improvement. 51-60 More healthful dietary pattern, with some room for improvement.  >60 Healthy dietary pattern, although there may be some specific behaviors that could be improved.    Nutrition Goals Re-Evaluation:   Nutrition Goals Discharge (Final Nutrition Goals Re-Evaluation):   Psychosocial: Target Goals: Acknowledge presence or absence of significant  depression and/or stress, maximize coping skills, provide positive support system. Participant is able to verbalize types and ability to use techniques and skills needed for reducing stress and depression.  Initial Review & Psychosocial Screening:  Initial Psych Review & Screening - 12/06/21 1348       Initial Review   Current issues with Current Psychotropic Meds;Current Depression;Current Sleep Concerns      Family Dynamics   Good Support System? Yes      Barriers   Psychosocial barriers to participate in program There are no identifiable barriers or psychosocial needs.      Screening Interventions   Interventions Encouraged to exercise;Provide feedback about the  scores to participant    Expected Outcomes Short Term goal: Identification and review with participant of any Quality of Life or Depression concerns found by scoring the questionnaire.             Quality of Life Scores:  Quality of Life - 12/06/21 1403       Quality of Life   Select Quality of Life      Quality of Life Scores   Health/Function Pre 11.63 %    Socioeconomic Pre 17.14 %    Psych/Spiritual Pre 14.93 %    Family Pre 21 %    GLOBAL Pre 14.54 %            Scores of 19 and below usually indicate a poorer quality of life in these areas.  A difference of  2-3 points is a clinically meaningful difference.  A difference of 2-3 points in the total score of the Quality of Life Index has been associated with significant improvement in overall quality of life, self-image, physical symptoms, and general health in studies assessing change in quality of life.   PHQ-9: Review Flowsheet       12/06/2021  Depression screen PHQ 2/9  Decreased Interest 2  Down, Depressed, Hopeless 0  PHQ - 2 Score 2  Altered sleeping 1  Tired, decreased energy 2  Change in appetite 2  Feeling bad or failure about yourself  0  Trouble concentrating 0  Moving slowly or fidgety/restless 0  Suicidal thoughts 0  PHQ-9  Score 7  Difficult doing work/chores Somewhat difficult   Interpretation of Total Score  Total Score Depression Severity:  1-4 = Minimal depression, 5-9 = Mild depression, 10-14 = Moderate depression, 15-19 = Moderately severe depression, 20-27 = Severe depression   Psychosocial Evaluation and Intervention:  Psychosocial Evaluation - 12/06/21 1349       Psychosocial Evaluation & Interventions   Interventions Stress management education;Relaxation education;Encouraged to exercise with the program and follow exercise prescription    Comments Patient has no psychosocial barriers identified to participate in PR at her orientation visit. Her PHQ-9 score was 7 due to her lack of energy and sleep concerns mainly. She says she started taking the Zoloft a few years ago when her COPD started preventing her from doing the things she wanted to do. She feels her depression is managed well. She lives with her husband of many years. They do not have children but she says she help raise 2 of her first cousins and their children as well. She names her husband and 2 cousins as her support people. She is a retired Licensed conveyancer. She says she used to be very active in her yard and garden and wants to be able to do these things again. She is ready to start the program hoping to get back to some of these activities.    Expected Outcomes Patient's depression will continue to be managed and she will continue to have no psychosocial barriers identified.    Continue Psychosocial Services  No Follow up required             Psychosocial Re-Evaluation:  Psychosocial Re-Evaluation     [place] Name 12/26/21 1248 01/22/22 0909 02/19/22 0811 03/15/22 1007       Psychosocial Re-Evaluation   Current issues with -- -- Current Depression;Current Sleep Concerns;Current Psychotropic Meds Current Depression;Current Sleep Concerns;Current Psychotropic Meds    Comments Kamiah is doing well in her first sessions in PR.  Pt is working  towards being able to work in her yard and increased energy for ADL's.  Well supported by her husband.  Pts depression is mamnaged will with Zoloft. Pt is working towards being able to work in her yard and increased energy for [employer].  Well supported by her husband.  Pts depression is mamnaged will with Zoloft.  Manuela seems to be enjoying the program and socializing with with class members and staff.  Patient has no current psychosocial issues currently. Well supported by her husband.  Pts depression is managed with Zoloft.  Aamna seems to be enjoying the program and socializing with class members and staff.  Pt has gained 5 pounds since first visit, however she will be provided with healthy eating habbits booklet today 02/19/22, to help her reach her goal of weight reduction  Patient has no current psychosocial issues currently. Well supported by her husband.  Pts depression is managed with Zoloft.  Mckaila seems to be enjoying the program and socializing with class members and staff.  Patient has no current psychosocial issues currently.    Expected Outcomes Pt will complete the progra, meeting both program and personal goals. Pt will complete the program, meeting both program and personal goals. Pt will complete the program, meeting both program and personal goals. Pt will complete the program, meeting both program and personal goals.    Interventions Relaxation education;Encouraged to attend Pulmonary Rehabilitation for the exercise;Stress management education Relaxation education;Encouraged to attend Pulmonary Rehabilitation for the exercise;Stress management education Relaxation education;Encouraged to attend Pulmonary Rehabilitation for the exercise;Stress management education Relaxation education;Encouraged to attend Pulmonary Rehabilitation for the exercise;Stress management education    Continue Psychosocial Services  No Follow up required No Follow up required No Follow up required No Follow up required              Psychosocial Discharge (Final Psychosocial Re-Evaluation):  Psychosocial Re-Evaluation - 03/15/22 1007       Psychosocial Re-Evaluation   Current issues with Current Depression;Current Sleep Concerns;Current Psychotropic Meds    Comments Well supported by her husband.  Pts depression is managed with Zoloft.  Samira seems to be enjoying the program and socializing with class members and staff.  Patient has no current psychosocial issues currently.    Expected Outcomes Pt will complete the program, meeting both program and personal goals.    Interventions Relaxation education;Encouraged to attend Pulmonary Rehabilitation for the exercise;Stress management education    Continue Psychosocial Services  No Follow up required              Education: Education Goals: Education classes will be provided on a weekly basis, covering required topics. Participant will state understanding/return demonstration of topics presented.  Learning Barriers/Preferences:  Learning Barriers/Preferences - 12/06/21 1345       Learning Barriers/Preferences   Learning Barriers None    Learning Preferences Written Material;Skilled Demonstration             Education Topics: How Lungs Work and Diseases: - Discuss the anatomy of the lungs and diseases that can affect the lungs, such as COPD. Flowsheet Row PULMONARY REHAB OTHER RESPIRATORY from 04/18/2022 in [place]  Date 12/27/21  Educator DF  Instruction Review Code 1- Verbalizes Understanding       Exercise: -Discuss the importance of exercise, FITT principles of exercise, normal and abnormal responses to exercise, and how to exercise safely.   Environmental Irritants: -Discuss types of environmental irritants and how to limit exposure to environmental irritants. Flowsheet Row PULMONARY  REHAB OTHER RESPIRATORY from 04/18/2022 in [place]  Date 04/04/22  Educator HB   Instruction Review Code 1- Verbalizes Understanding       Meds/Inhalers and oxygen: - Discuss respiratory medications, definition of an inhaler and oxygen, and the proper way to use an inhaler and oxygen. Flowsheet Row PULMONARY REHAB OTHER RESPIRATORY from 04/18/2022 in [place]  Date 01/10/22  Educator HJ       Energy Saving Techniques: - Discuss methods to conserve energy and decrease shortness of breath when performing activities of daily living.  Flowsheet Row PULMONARY REHAB OTHER RESPIRATORY from 04/18/2022 in [place]  Date 01/17/22  Educator Handout  Instruction Review Code 1- Verbalizes Understanding       Bronchial Hygiene / Breathing Techniques: - Discuss breathing mechanics, pursed-lip breathing technique,  proper posture, effective ways to clear airways, and other functional breathing techniques Flowsheet Row PULMONARY REHAB OTHER RESPIRATORY from 04/18/2022 in [place]  Date 01/24/22  Educator HB  Instruction Review Code 1- Research scientist (medical): - Provides group verbal and written instruction about the health risks of elevated stress, cause of high stress, and healthy ways to reduce stress.   Nutrition I: Fats: - Discuss the types of cholesterol, what cholesterol does to the body, and how cholesterol levels can be controlled. Flowsheet Row PULMONARY REHAB OTHER RESPIRATORY from 04/18/2022 in [place]  Date 02/07/22  Educator handout  Instruction Review Code 1- Verbalizes Understanding       Nutrition II: Labels: -Discuss the different components of food labels and how to read food labels. Flowsheet Row PULMONARY REHAB OTHER RESPIRATORY from 04/18/2022 in [place]  Date 02/21/22  Educator HB  Instruction Review Code 1- Verbalizes Understanding       Respiratory Infections: - Discuss the signs and  symptoms of respiratory infections, ways to prevent respiratory infections, and the importance of seeking medical treatment when having a respiratory infection.   Stress I: Signs and Symptoms: - Discuss the causes of stress, how stress may lead to anxiety and depression, and ways to limit stress. Flowsheet Row PULMONARY REHAB OTHER RESPIRATORY from 04/18/2022 in [place]  Date 03/07/22  Educator DF  Instruction Review Code 2- Demonstrated Understanding       Stress II: Relaxation: -Discuss relaxation techniques to limit stress. Flowsheet Row PULMONARY REHAB OTHER RESPIRATORY from 04/18/2022 in [place]  Date 12/13/21  Educator DM  Instruction Review Code 1- Verbalizes Understanding       Oxygen for Home/Travel: - Discuss how to prepare for travel when on oxygen and proper ways to transport and store oxygen to ensure safety. Flowsheet Row PULMONARY REHAB OTHER RESPIRATORY from 04/18/2022 in [place]  Date 12/20/21  Educator DF  Instruction Review Code 1- Verbalizes Understanding       Knowledge Questionnaire Score:  Knowledge Questionnaire Score - 12/06/21 1248       Knowledge Questionnaire Score   Pre Score 14/18             Core Components/Risk Factors/Patient Goals at Admission:  Personal Goals and Risk Factors at Admission - 12/06/21 1345       Core Components/Risk Factors/Patient Goals on Admission    Weight Management Obesity;Yes    Intervention Weight Management: Provide education and appropriate resources to help participant work on and attain dietary goals.;Weight Management/Obesity: Establish reasonable short term and long  term weight goals.;Obesity: Provide education and appropriate resources to help participant work on and attain dietary goals.    Admit Weight 264 lb 4.8 oz (119.9 kg)    Expected Outcomes Weight Gain: Understanding of general recommendations for a high calorie,  high protein meal plan that promotes weight gain by distributing calorie intake throughout the day with the consumption for 4-5 meals, snacks, and/or supplements    Improve shortness of breath with ADL's Yes    Intervention Provide education, individualized exercise plan and daily activity instruction to help decrease symptoms of SOB with activities of daily living.    Expected Outcomes Short Term: Improve cardiorespiratory fitness to achieve a reduction of symptoms when performing ADLs;Long Term: Be able to perform more ADLs without symptoms or delay the onset of symptoms    Personal Goal Other Yes    Personal Goal Patient wants to have more energy build muscles and lose weight.    Intervention Patient will attend PR 2 days/week with exercise and education and supplement with exercise at home.    Expected Outcomes Patient will complete the program meeting both personal and program goals.             Core Components/Risk Factors/Patient Goals Review:   Goals and Risk Factor Review     Row Name 12/26/21 1253 12/26/21 1254 01/22/22 0916 02/19/22 0823 03/15/22 1008     Core Components/Risk Factors/Patient Goals Review   Personal Goals Review Weight Management/Obesity;Stress -- Weight Management/Obesity;Stress Weight Management/Obesity;Stress Weight Management/Obesity;Stress   Review -- has completed 5 session in PR.  Tolerating program well.  Exercises without O2 and Sats are running93-95% on room air.  Socializing well with staff and other clients.  Vital signs has been stable.  Pt has been maintaining her weight thus far. Desarae has completed 11 session in PR.  Tolerating program well.  Exercises without O2 and Sats are running 91-95% on room air.  Socializing well with staff and other clients.  Vital signs has been within guidelines.  Pt has been maintaining her weight thus far.  CT scan (chest) done on 01/04/2022, showing improvments from previous CT scan done in 2019. Jontavia has completed 17  session in PR.  Tolerating program well.  Exercises without O2 and Sats are running 91-95% on room air.  Socializing well with staff and other clients.  Vital signs has been within guidelines.  Pt has been maintaining her weight thus far.  CT scan (chest) done on 01/04/2022, showing improvments from previous CT scan done in 2019. Shynice has completed 22 session in PR.  Tolerating program well.  Exercises without O2 and Sats are running 91-95% on room air.  Socializing well with staff and other clients.  Vital signs has been within guidelines.  Pt has been maintaining her weight thus far.   Expected Outcomes -- Plan of care is ongoing.  No further concerns as of present. Plan of care is ongoing.  No further concerns as of present. Plan of care is ongoing.  No further concerns as of present. Plan of care is ongoing.  No further concerns as of present.            Core Components/Risk Factors/Patient Goals at Discharge (Final Review):   Goals and Risk Factor Review - 03/15/22 1008       Core Components/Risk Factors/Patient Goals Review   Personal Goals Review Weight Management/Obesity;Stress    Review Janney has completed 22 session in PR.  Tolerating program well.  Exercises without O2 and  Sats are running 91-95% on room air.  Socializing well with staff and other clients.  Vital signs has been within guidelines.  Pt has been maintaining her weight thus far.    Expected Outcomes Plan of care is ongoing.  No further concerns as of present.             ITP Comments:   Comments: ITP REVIEW Pt is making expected progress toward pulmonary rehab goals after completing 27 sessions. Recommend continued exercise, life style modification, education, and utilization of breathing techniques to increase stamina and strength and decrease shortness of breath with exertion.

## 2022-04-25 ENCOUNTER — Encounter (HOSPITAL_COMMUNITY)
Admission: RE | Admit: 2022-04-25 | Discharge: 2022-04-25 | Disposition: A | Discharge status: Home / Self Care | Payer: Medicare Other | Source: Ambulatory Visit | Attending: Internal Medicine | Admitting: Internal Medicine

## 2022-04-25 VITALS — Ht 60.0 in | Wt 267.6 lb

## 2022-04-25 DIAGNOSIS — J449 Chronic obstructive pulmonary disease, unspecified: Secondary | ICD-10-CM | POA: Diagnosis present

## 2022-04-25 NOTE — Progress Notes (Signed)
Daily Session Note  Patient Details  Name: Paula [name] MRN: [id number] Date of Birth: 1950-11-16 Referring Provider:   Barney from 12/06/2021 in [place]  Referring Provider Dr. Georgann Housekeeper       Encounter Date: 04/25/2022  Check In:  Session Check In - 04/25/22 1044       Check-In   Supervising physician immediately available to respond to emergencies [facility] MD immediately available    Physician(s) Dr Dellia Cloud    Location [facility]    Staff Present Leana Roe, BS, Exercise Physiologist;Havish Petties BSN, RN    Virtual Visit No    Medication changes reported     No    Fall or balance concerns reported    No    Tobacco Cessation No Change    Warm-up and Cool-down Performed as group-led instruction    Resistance Training Performed Yes    VAD Patient? No    PAD/SET Patient? No      Pain Assessment   Currently in Pain? Yes    Pain Score 3     Pain Location Heel    Pain Orientation Left    Pain Descriptors / Indicators Sore    Pain Type Chronic pain    Pain Frequency Constant    Multiple Pain Sites No             Capillary Blood Glucose: No results found for this or any previous visit (from the past 24 hour(s)).    Social History   Tobacco Use  Smoking Status Former   Packs/day: 0.75   Years: 20.00   Total pack years: 15.00   Types: Cigarettes  Smokeless Tobacco Former   Quit date: 10/31/1998    Goals Met:  Proper associated with RPD/PD & O2 Sat Independence with exercise equipment Using PLB without cueing & demonstrates good technique Exercise tolerated well Queuing for purse lip breathing No report of concerns or symptoms today Strength training completed today  Goals Unmet:  Not Applicable  Comments: check out at 11:45-pt completed walk test for program graduation today.   Dr. Kathie Dike is Medical Director for [facility].

## 2022-04-30 NOTE — Progress Notes (Signed)
Discharge Progress Report  Patient Details  Name: Paula [name] MRN: [id number] Date of Birth: 1950-12-25 Referring Provider:   [facility] from 12/06/2021 in [place]  Referring Provider Dr. Georgann Housekeeper        Number of Visits: 28  Reason for Discharge:  Patient reached a stable level of exercise. Patient independent in their exercise. Patient has met program and personal goals.  Smoking History:  Social History   Tobacco Use  Smoking Status Former   Packs/day: 0.75   Years: 20.00   Total pack years: 15.00   Types: Cigarettes  Smokeless Tobacco Former   Quit date: 10/31/1998    Diagnosis:  Chronic obstructive pulmonary disease, unspecified COPD type ([place])  ADL UCSD:  Pulmonary Assessment Scores     Row Name 12/06/21 1250 04/30/22 1105       ADL UCSD   ADL Phase Entry Exit    SOB Score total 33 47    Rest 0 1    Walk 3 2    Stairs 4 4    Bath 0 2    Dress 1 1    Shop 2 3      CAT Score   CAT Score 11 14      mMRC Score   mMRC Score 2 2             Initial Exercise Prescription:  Initial Exercise Prescription - 12/06/21 1400       Date of Initial Exercise RX and Referring Provider   Date 12/06/21    Referring Provider Dr. Georgann Housekeeper    Expected Discharge Date 04/11/22      NuStep   Level 1    SPM 60    Minutes 22      Arm Ergometer   Level 1    RPM 45    Minutes 17      Prescription Details   Frequency (times per week) 2    Duration Progress to 30 minutes of continuous aerobic without signs/symptoms of physical distress      Intensity   THRR 40-80% of Max Heartrate 52-118    Ratings of Perceived Exertion 11-13    Perceived Dyspnea 0-4      Resistance Training   Training Prescription Yes    Weight 2    Reps 10-15             Discharge Exercise Prescription (Final Exercise Prescription Changes):  Exercise Prescription Changes - 04/04/22 1147       Response to  Exercise   Blood Pressure (Admit) 136/70    Blood Pressure (Exercise) 150/80    Blood Pressure (Exit) 130/70    Heart Rate (Admit) 87 bpm    Heart Rate (Exercise) 91 bpm    Heart Rate (Exit) 71 bpm    Oxygen Saturation (Admit) 91 %    Oxygen Saturation (Exercise) 92 %    Oxygen Saturation (Exit) 92 %    Rating of Perceived Exertion (Exercise) 12    Perceived Dyspnea (Exercise) 13    Duration Continue with 30 min of aerobic exercise without signs/symptoms of physical distress.    Intensity THRR unchanged      Progression   Progression Continue to progress workloads to maintain intensity without signs/symptoms of physical distress.      Resistance Training   Training Prescription Yes    Weight 3    Reps 10-15    Time 10 Minutes  NuStep   Level 3    SPM 124    Minutes 22    METs 2.3      Arm Ergometer   Level 2    RPM 59    Minutes 17    METs 2             Functional Capacity:  6 Minute Walk     Row Name 12/06/21 1357 04/25/22 1553       6 Minute Walk   Phase Initial Discharge    Distance 800 feet 700 feet    Walk Time 6 minutes 6 minutes    # of Rest Breaks 2 2    MPH 1.51 1.32    METS 1.11 0.63    RPE 14 13    Perceived Dyspnea  14 15    VO2 Peak 3.89 2.23    Symptoms Yes (comment) Yes (comment)    Comments two standing breaks due to SOB and bilateral hip pain (6/10) 20 & 30 seconds. one standing break (15 sec) and one sitting break (2 min 30 sec), pt had to take a sitting break due pain in foot.    Resting HR 73 bpm 75 bpm    Resting BP 134/66 140/70    Resting Oxygen Saturation  94 % 93 %    Exercise Oxygen Saturation  during 6 min walk 87 % 88 %    Max Ex. HR 120 bpm 104 bpm    Max Ex. BP 160/66 150/70    2 Minute Post BP 140/66 142/70      Interval HR   1 Minute HR 100 104    2 Minute HR 103 104    3 Minute HR 103 103    4 Minute HR 113 102    5 Minute HR 120 88    6 Minute HR 115 100    2 Minute Post HR 76 74    Interval Heart Rate?  Yes Yes      Interval Oxygen   Interval Oxygen? Yes Yes    Baseline Oxygen Saturation % 94 % 93 %    1 Minute Oxygen Saturation % 87 % 91 %    1 Minute Liters of Oxygen 0 L 0 L    2 Minute Oxygen Saturation % 88 % 90 %    2 Minute Liters of Oxygen 0 L 0 L    3 Minute Oxygen Saturation % 90 % 90 %    3 Minute Liters of Oxygen 0 L 0 L    4 Minute Oxygen Saturation % 87 % 88 %    4 Minute Liters of Oxygen 0 L 0 L    5 Minute Oxygen Saturation % 87 % 92 %    5 Minute Liters of Oxygen 0 L 0 L    6 Minute Oxygen Saturation % 87 % 93 %    6 Minute Liters of Oxygen 0 L 0 L    2 Minute Post Oxygen Saturation % 95 % 95 %    2 Minute Post Liters of Oxygen 0 L 0 L             Psychological, QOL, Others - Outcomes: PHQ 2/9:    04/30/2022   11:08 AM 12/06/2021    1:07 PM  Depression screen PHQ 2/9  Decreased Interest 1 2  Down, Depressed, Hopeless 1 0  PHQ - 2 Score 2 2  Altered sleeping 1 1  Tired, decreased energy 2 2  Change in appetite 2 2  Feeling bad or failure about yourself  1 0  Trouble concentrating 0 0  Moving slowly or fidgety/restless 0 0  Suicidal thoughts 0 0  PHQ-9 Score 8 7  Difficult doing work/chores Not difficult at all Somewhat difficult    Quality of Life:  Quality of Life - 12/06/21 1403       Quality of Life   Select Quality of Life      Quality of Life Scores   Health/Function Pre 11.63 %    Socioeconomic Pre 17.14 %    Psych/Spiritual Pre 14.93 %    Family Pre 21 %    GLOBAL Pre 14.54 %             Personal Goals: Goals established at orientation with interventions provided to work toward goal.  Personal Goals and Risk Factors at Admission - 12/06/21 1345       Core Components/Risk Factors/Patient Goals on Admission    Weight Management Obesity;Yes    Intervention Weight Management: Provide education and appropriate resources to help participant work on and attain dietary goals.;Weight Management/Obesity: Establish reasonable short  term and long term weight goals.;Obesity: Provide education and appropriate resources to help participant work on and attain dietary goals.    Admit Weight 264 lb 4.8 oz (119.9 kg)    Expected Outcomes Weight Gain: Understanding of general recommendations for a high calorie, high protein meal plan that promotes weight gain by distributing calorie intake throughout the day with the consumption for 4-5 meals, snacks, and/or supplements    Improve shortness of breath with ADL's Yes    Intervention Provide education, individualized exercise plan and daily activity instruction to help decrease symptoms of SOB with activities of daily living.    Expected Outcomes Short Term: Improve cardiorespiratory fitness to achieve a reduction of symptoms when performing ADLs;Long Term: Be able to perform more ADLs without symptoms or delay the onset of symptoms    Personal Goal Other Yes    Personal Goal Patient wants to have more energy build muscles and lose weight.    Intervention Patient will attend PR 2 days/week with exercise and education and supplement with exercise at home.    Expected Outcomes Patient will complete the program meeting both personal and program goals.              Personal Goals Discharge:  Goals and Risk Factor Review     Row Name 12/26/21 1253 12/26/21 1254 01/22/22 0916 02/19/22 0823 03/15/22 1008     Core Components/Risk Factors/Patient Goals Review   Personal Goals Review Weight Management/Obesity;Stress -- Weight Management/Obesity;Stress Weight Management/Obesity;Stress Weight Management/Obesity;Stress   Review -- has completed 5 session in PR.  Tolerating program well.  Exercises without O2 and Sats are running93-95% on room air.  Socializing well with staff and other clients.  Vital signs has been stable.  Pt has been maintaining her weight thus far. Paula [name] has completed 11 session in PR.  Tolerating program well.  Exercises without O2 and Sats are running 91-95% on room air.   Socializing well with staff and other clients.  Vital signs has been within guidelines.  Pt has been maintaining her weight thus far.  CT scan (chest) done on 01/04/2022, showing improvments from previous CT scan done in 2019. Paula [name] has completed 17 session in PR.  Tolerating program well.  Exercises without O2 and Sats are running 91-95% on room air.  Socializing well with staff and other clients.  Vital  signs has been within guidelines.  Pt has been maintaining her weight thus far.  CT scan (chest) done on 01/04/2022, showing improvments from previous CT scan done in 2019. Paula [name] has completed 22 session in PR.  Tolerating program well.  Exercises without O2 and Sats are running 91-95% on room air.  Socializing well with staff and other clients.  Vital signs has been within guidelines.  Pt has been maintaining her weight thus far.   Expected Outcomes -- Plan of care is ongoing.  No further concerns as of present. Plan of care is ongoing.  No further concerns as of present. Plan of care is ongoing.  No further concerns as of present. Plan of care is ongoing.  No further concerns as of present.    [place] Name 04/30/22 1115             Core Components/Risk Factors/Patient Goals Review   Personal Goals Review Weight Management/Obesity;Stress       Review Pt graduated after 28 sessions of PR. Her vitals were WNLs while in the program. Her weight was stable while in the program. She was making good progress in the program with good attendance until the end of the program. 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 This will require surgery in [place], so she will be unable to exercise until then. She is unable to weight bare without pain.       Expected Outcomes Pt will continue to work towards their goals post discharge.                Exercise Goals and Review:  Exercise Goals     Row Name 12/06/21 1402 01/01/22 1342 01/29/22 1328  02/26/22 1259 03/26/22 1011     Exercise Goals   Increase Physical Activity Yes Yes Yes Yes Yes   Intervention Provide advice, education, support and counseling about physical activity/exercise needs.;Develop an individualized exercise prescription for aerobic and resistive training based on initial evaluation findings, risk stratification, comorbidities and participant's personal goals. Provide advice, education, support and counseling about physical activity/exercise needs.;Develop an individualized exercise prescription for aerobic and resistive training based on initial evaluation findings, risk stratification, comorbidities and participant's personal goals. Provide advice, education, support and counseling about physical activity/exercise needs.;Develop an individualized exercise prescription for aerobic and resistive training based on initial evaluation findings, risk stratification, comorbidities and participant's personal goals. Provide advice, education, support and counseling about physical activity/exercise needs.;Develop an individualized exercise prescription for aerobic and resistive training based on initial evaluation findings, risk stratification, comorbidities and participant's personal goals. Provide advice, education, support and counseling about physical activity/exercise needs.;Develop an individualized exercise prescription for aerobic and resistive training based on initial evaluation findings, risk stratification, comorbidities and participant's personal goals.   Expected Outcomes Short Term: Attend rehab on a regular basis to increase amount of physical activity.;Long Term: Add in home exercise to make exercise part of routine and to increase amount of physical activity.;Long Term: Exercising regularly at least 3-5 days a week. Short Term: Attend rehab on a regular basis to increase amount of physical activity.;Long Term: Add in home exercise to make exercise part of routine and to  increase amount of physical activity.;Long Term: Exercising regularly at least 3-5 days a week. Short Term: Attend rehab on a regular basis to increase amount of physical activity.;Long Term: Add in home exercise to make exercise part of routine and to increase amount of physical  activity.;Long Term: Exercising regularly at least 3-5 days a week. Short Term: Attend rehab on a regular basis to increase amount of physical activity.;Long Term: Add in home exercise to make exercise part of routine and to increase amount of physical activity.;Long Term: Exercising regularly at least 3-5 days a week. Short Term: Attend rehab on a regular basis to increase amount of physical activity.;Long Term: Add in home exercise to make exercise part of routine and to increase amount of physical activity.;Long Term: Exercising regularly at least 3-5 days a week.   Increase Strength and Stamina Yes Yes Yes Yes Yes   Intervention Provide advice, education, support and counseling about physical activity/exercise needs.;Develop an individualized exercise prescription for aerobic and resistive training based on initial evaluation findings, risk stratification, comorbidities and participant's personal goals. Provide advice, education, support and counseling about physical activity/exercise needs.;Develop an individualized exercise prescription for aerobic and resistive training based on initial evaluation findings, risk stratification, comorbidities and participant's personal goals. Provide advice, education, support and counseling about physical activity/exercise needs.;Develop an individualized exercise prescription for aerobic and resistive training based on initial evaluation findings, risk stratification, comorbidities and participant's personal goals. Provide advice, education, support and counseling about physical activity/exercise needs.;Develop an individualized exercise prescription for aerobic and resistive training based on  initial evaluation findings, risk stratification, comorbidities and participant's personal goals. Provide advice, education, support and counseling about physical activity/exercise needs.;Develop an individualized exercise prescription for aerobic and resistive training based on initial evaluation findings, risk stratification, comorbidities and participant's personal goals.   Expected Outcomes Short Term: Increase workloads from initial exercise prescription for resistance, speed, and METs.;Short Term: Perform resistance training exercises routinely during rehab and add in resistance training at home;Long Term: Improve cardiorespiratory fitness, muscular endurance and strength as measured by increased METs and functional capacity (6MWT) Short Term: Increase workloads from initial exercise prescription for resistance, speed, and METs.;Short Term: Perform resistance training exercises routinely during rehab and add in resistance training at home;Long Term: Improve cardiorespiratory fitness, muscular endurance and strength as measured by increased METs and functional capacity (6MWT) Short Term: Increase workloads from initial exercise prescription for resistance, speed, and METs.;Short Term: Perform resistance training exercises routinely during rehab and add in resistance training at home;Long Term: Improve cardiorespiratory fitness, muscular endurance and strength as measured by increased METs and functional capacity (6MWT) Short Term: Increase workloads from initial exercise prescription for resistance, speed, and METs.;Short Term: Perform resistance training exercises routinely during rehab and add in resistance training at home;Long Term: Improve cardiorespiratory fitness, muscular endurance and strength as measured by increased METs and functional capacity (6MWT) Short Term: Increase workloads from initial exercise prescription for resistance, speed, and METs.;Short Term: Perform resistance training exercises  routinely during rehab and add in resistance training at home;Long Term: Improve cardiorespiratory fitness, muscular endurance and strength as measured by increased METs and functional capacity (6MWT)   Able to understand and use rate of perceived exertion (RPE) scale Yes Yes Yes Yes Yes   Intervention Provide education and explanation on how to use RPE scale Provide education and explanation on how to use RPE scale Provide education and explanation on how to use RPE scale Provide education and explanation on how to use RPE scale Provide education and explanation on how to use RPE scale   Expected Outcomes Short Term: Able to use RPE daily in rehab to express subjective intensity level;Long Term:  Able to use RPE to guide intensity level when exercising independently Short Term: Able to use RPE  daily in rehab to express subjective intensity level;Long Term:  Able to use RPE to guide intensity level when exercising independently Short Term: Able to use RPE daily in rehab to express subjective intensity level;Long Term:  Able to use RPE to guide intensity level when exercising independently Short Term: Able to use RPE daily in rehab to express subjective intensity level;Long Term:  Able to use RPE to guide intensity level when exercising independently Short Term: Able to use RPE daily in rehab to express subjective intensity level;Long Term:  Able to use RPE to guide intensity level when exercising independently   Able to understand and use Dyspnea scale Yes Yes Yes Yes Yes   Intervention Provide education and explanation on how to use Dyspnea scale Provide education and explanation on how to use Dyspnea scale Provide education and explanation on how to use Dyspnea scale Provide education and explanation on how to use Dyspnea scale Provide education and explanation on how to use Dyspnea scale   Expected Outcomes Short Term: Able to use Dyspnea scale daily in rehab to express subjective sense of shortness of  breath during exertion;Long Term: Able to use Dyspnea scale to guide intensity level when exercising independently Short Term: Able to use Dyspnea scale daily in rehab to express subjective sense of shortness of breath during exertion;Long Term: Able to use Dyspnea scale to guide intensity level when exercising independently Short Term: Able to use Dyspnea scale daily in rehab to express subjective sense of shortness of breath during exertion;Long Term: Able to use Dyspnea scale to guide intensity level when exercising independently Short Term: Able to use Dyspnea scale daily in rehab to express subjective sense of shortness of breath during exertion;Long Term: Able to use Dyspnea scale to guide intensity level when exercising independently Short Term: Able to use Dyspnea scale daily in rehab to express subjective sense of shortness of breath during exertion;Long Term: Able to use Dyspnea scale to guide intensity level when exercising independently   Knowledge and understanding of Target Heart Rate Range (THRR) Yes Yes Yes Yes Yes   Intervention Provide education and explanation of THRR including how the numbers were predicted and where they are located for reference Provide education and explanation of THRR including how the numbers were predicted and where they are located for reference Provide education and explanation of THRR including how the numbers were predicted and where they are located for reference Provide education and explanation of THRR including how the numbers were predicted and where they are located for reference Provide education and explanation of THRR including how the numbers were predicted and where they are located for reference   Expected Outcomes Short Term: Able to state/look up THRR;Short Term: Able to use daily as guideline for intensity in rehab;Long Term: Able to use THRR to govern intensity when exercising independently Short Term: Able to state/look up THRR;Short Term: Able to  use daily as guideline for intensity in rehab;Long Term: Able to use THRR to govern intensity when exercising independently Short Term: Able to state/look up THRR;Short Term: Able to use daily as guideline for intensity in rehab;Long Term: Able to use THRR to govern intensity when exercising independently Short Term: Able to state/look up THRR;Short Term: Able to use daily as guideline for intensity in rehab;Long Term: Able to use THRR to govern intensity when exercising independently Short Term: Able to state/look up THRR;Short Term: Able to use daily as guideline for intensity in rehab;Long Term: Able to use THRR to govern intensity  when exercising independently   Able to check pulse independently Yes Yes -- -- Yes   Intervention Provide education and demonstration on how to check pulse in carotid and radial arteries.;Review the importance of being able to check your own pulse for safety during independent exercise Provide education and demonstration on how to check pulse in carotid and radial arteries.;Review the importance of being able to check your own pulse for safety during independent exercise Provide education and demonstration on how to check pulse in carotid and radial arteries.;Review the importance of being able to check your own pulse for safety during independent exercise -- Provide education and demonstration on how to check pulse in carotid and radial arteries.;Review the importance of being able to check your own pulse for safety during independent exercise   Expected Outcomes Short Term: Able to explain why pulse checking is important during independent exercise;Long Term: Able to check pulse independently and accurately Short Term: Able to explain why pulse checking is important during independent exercise;Long Term: Able to check pulse independently and accurately Short Term: Able to explain why pulse checking is important during independent exercise;Long Term: Able to check pulse  independently and accurately -- Short Term: Able to explain why pulse checking is important during independent exercise;Long Term: Able to check pulse independently and accurately   Understanding of Exercise Prescription Yes Yes Yes Yes Yes   Intervention Provide education, explanation, and written materials on patient's individual exercise prescription Provide education, explanation, and written materials on patient's individual exercise prescription Provide education, explanation, and written materials on patient's individual exercise prescription Provide education, explanation, and written materials on patient's individual exercise prescription Provide education, explanation, and written materials on patient's individual exercise prescription   Expected Outcomes Short Term: Able to explain program exercise prescription;Long Term: Able to explain home exercise prescription to exercise independently Short Term: Able to explain program exercise prescription;Long Term: Able to explain home exercise prescription to exercise independently Short Term: Able to explain program exercise prescription;Long Term: Able to explain home exercise prescription to exercise independently Short Term: Able to explain program exercise prescription;Long Term: Able to explain home exercise prescription to exercise independently Short Term: Able to explain program exercise prescription;Long Term: Able to explain home exercise prescription to exercise independently            Exercise Goals Re-Evaluation:  Exercise Goals Re-Evaluation     Row Name 01/01/22 1342 01/29/22 1329 01/30/22 0731 02/26/22 1300 03/26/22 1012     Exercise Goal Re-Evaluation   Exercise Goals Review Increase Physical Activity;Increase Strength and Stamina;Able to understand and use rate of perceived exertion (RPE) scale;Able to understand and use Dyspnea scale;Knowledge and understanding of Target Heart Rate Range (THRR);Understanding of Exercise  Prescription Increase Physical Activity;Increase Strength and Stamina;Able to understand and use rate of perceived exertion (RPE) scale;Able to understand and use Dyspnea scale;Knowledge and understanding of Target Heart Rate Range (THRR);Understanding of Exercise Prescription -- Increase Physical Activity;Increase Strength and Stamina;Able to understand and use rate of perceived exertion (RPE) scale;Able to understand and use Dyspnea scale;Knowledge and understanding of Target Heart Rate Range (THRR);Understanding of Exercise Prescription Increase Physical Activity;Able to understand and use rate of perceived exertion (RPE) scale;Increase Strength and Stamina;Able to understand and use Dyspnea scale;Knowledge and understanding of Target Heart Rate Range (THRR);Understanding of Exercise Prescription   Comments Pt has completed 7 sessions of PR. She is motivated during class and has increased her levels on the stepper. She has increased her weights and band for the  warm up. She is currently exercising at 2.0 METs on the stepper. Will continue to monitor and progress as able. Pt has completed 13 sessions of PR. She continues to increase her workloads on both pieces of equipment. She enjous coming to class each week. She is currently exercising at 2.2 METs on the AE. Will continue to monitor and progress as able. -- Pt has completed19 sessions of PR. She continues to enjoy coming to class and is pushes herself during class. She has not increased her level but does improve on her SPM/RPM each class. Pt is currently exercising at 2.1 METs on the AE. Will continue to monitor and progress as able. Pt has completed 21 session of PR. She continues to be motivated during class and is pushing herself to increase her SPM and level. Pt is curretnly exercising at 2.2 METs on the stepper. Will continue to montior and progress as able.   Expected Outcomes Through exercise at home and at rehab, the patient will meet their stated  goals. Through exercise at home and at rehab, the patient will meet their stated goals. Through exercise at home and at rehab, the patient will meet their stated goals. Through exercise at home and at rehab, the patient will meet their stated goals. Through exercise at home and at rehab, the patient will meet their stated goals.            Nutrition & Weight - Outcomes:  Pre Biometrics - 12/06/21 1402       Pre Biometrics   Height 5' (1.524 m)    Weight 264 lb 1.8 oz (119.8 kg)    Waist Circumference 54 inches    Hip Circumference 58 inches    Waist to Hip Ratio 0.93 %    BMI (Calculated) 51.58    Triceps Skinfold 45 mm    % Body Fat 63.3 %    Grip Strength 12.3 kg    Flexibility 0 in    Single Leg Stand 0 seconds             Post Biometrics - 04/25/22 1557        Post  Biometrics   Height 5' (1.524 m)    Weight 267 lb 10.2 oz (121.4 kg)    Waist Circumference 52 inches    Hip Circumference 58 inches    Waist to Hip Ratio 0.9 %    BMI (Calculated) 52.27    Triceps Skinfold 42 mm    % Body Fat 62.6 %    Grip Strength 15.8 kg    Flexibility 0 in    Single Leg Stand 0 seconds             Nutrition:  Nutrition Therapy & Goals - 12/06/21 1246       Personal Nutrition Goals   Comments Patient scored 52 on her diet assessment. Handout provided and explained regarding healthier choices. We offer 2 educational sessions on heart healthy nutrition with handouts and assistance with RD referral if patient is interested.      Intervention Plan   Intervention Nutrition handout(s) given to patient.    Expected Outcomes Short Term Goal: Understand basic principles of dietary content, such as calories, fat, sodium, cholesterol and nutrients.             Nutrition Discharge:  Nutrition Assessments - 04/30/22 1110       MEDFICTS Scores   Pre Score 52    Post Score 45    Score Difference -7  Education Questionnaire Score:  Knowledge  Questionnaire Score - 04/30/22 1059       Knowledge Questionnaire Score   Pre Score 14/18    Post Score 16/18             Goals reviewed with patient; copy given to patient. Pt graduated from [facility] after 28 sessions. Her walk test distance decreased by 12.5%, and her MET level at discharge was 2.3. Walking has become painful for her due to her foot, so that is likely why her walk test distance decreased. 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 She will undergo surgery in March due to this. She will be unable to continue exercise until after the surgery. She reports that after her surgery she may consider joining our maintenance program or joining a local gym.

## 2022-09-20 ENCOUNTER — Telehealth (HOSPITAL_COMMUNITY): Payer: Self-pay | Admitting: *Deleted

## 2022-09-20 NOTE — Telephone Encounter (Signed)
Cardiac Rehab Medication Review by a Nurse   Does the patient  feel that his/her medications are working for him/her?  yes   Has the patient been experiencing any side effects to the medications prescribed?  no   Does the patient measure his/her own blood pressure or blood glucose at home?  no    Does the patient have any problems obtaining medications due to transportation or finances?   yes   Understanding of regimen: good Understanding of indications: good Potential of compliance: good     Patient is getting financial aid with medication that she isn't able to obtain.  Verified medication via phone with patient. Nurse comments:

## 2022-09-24 ENCOUNTER — Encounter (HOSPITAL_COMMUNITY)
Admission: RE | Admit: 2022-09-24 | Discharge: 2022-09-24 | Disposition: A | Discharge status: Home / Self Care | Payer: Medicare Other | Source: Ambulatory Visit | Attending: Internal Medicine | Admitting: Internal Medicine

## 2022-09-24 DIAGNOSIS — J449 Chronic obstructive pulmonary disease, unspecified: Secondary | ICD-10-CM | POA: Insufficient documentation

## 2022-09-24 NOTE — Patient Instructions (Signed)
Patient Instructions  Patient Details  Name: Paula [name] MRN: [id number] Date of Birth: 03/20/1951 Referring Provider:  Warnell Forester, MD  Below are your personal goals for exercise, nutrition, and risk factors. Our goal is to help you stay on track towards obtaining and maintaining these goals. We will be discussing your progress on these goals with you throughout the program.  Initial Exercise Prescription:  Initial Exercise Prescription - 09/24/22 1400       Date of Initial Exercise RX and Referring Provider   Date 09/24/22    Referring Provider Warnell Forester MD      Oxygen   Maintain Oxygen Saturation 88% or higher      Treadmill   MPH 1.5    Grade 0.5    Minutes 17    METs 2.25      NuStep   Level 1    SPM 80    Minutes 17    METs 1.5      REL-XR   Level 1    Watts 20    Speed 50    Minutes 15    METs 1.5      Prescription Details   Frequency (times per week) 3    Duration Progress to 30 minutes of continuous aerobic without signs/symptoms of physical distress      Intensity   THRR 40-80% of Max Heartrate 106-133    Ratings of Perceived Exertion 11-13    Perceived Dyspnea 0-4      Progression   Progression Continue to progress workloads to maintain intensity without signs/symptoms of physical distress.      Resistance Training   Training Prescription Yes    Weight 3 lb    Reps 10-15             Exercise Goals: Frequency: Be able to perform aerobic exercise two to three times per week in program working toward 2-5 days per week of home exercise.  Intensity: Work with a perceived exertion of 11 (fairly light) - 15 (hard) while following your exercise prescription.  We will make changes to your prescription with you as you progress through the program.   Duration: Be able to do 30 to 45 minutes of continuous aerobic exercise in addition to a 5 minute warm-up and a 5 minute cool-down routine.   Nutrition Goals: Your personal  nutrition goals will be established when you do your nutrition analysis with the dietician.  The following are general nutrition guidelines to follow: Cholesterol < 200mg /day Sodium < 1500mg /day Fiber: Women over 50 yrs - 21 grams per day  Personal Goals:  Personal Goals and Risk Factors at Admission - 09/24/22 1255       Core Components/Risk Factors/Patient Goals on Admission    Weight Management Yes;Obesity;Weight Loss    Intervention Weight Management: Provide education and appropriate resources to help participant work on and attain dietary goals.;Weight Management/Obesity: Establish reasonable short term and long term weight goals.;Obesity: Provide education and appropriate resources to help participant work on and attain dietary goals.    Admit Weight 282 lb 6.4 oz (128.1 kg)    Goal Weight: Short Term 277 lb (125.6 kg)    Goal Weight: Long Term 272 lb (123.4 kg)    Expected Outcomes Short Term: Continue to assess and modify interventions until short term weight is achieved;Long Term: Adherence to nutrition and physical activity/exercise program aimed toward attainment of established weight goal;Weight Loss: Understanding of general recommendations for a balanced deficit meal plan, which promotes  1-2 lb weight loss per week and includes a negative energy balance of [phone] kcal/d;Understanding recommendations for meals to include 15-35% energy as protein, 25-35% energy from fat, 35-60% energy from carbohydrates, less than 200mg  of dietary cholesterol, 20-35 gm of total fiber daily;Understanding of distribution of calorie intake throughout the day with the consumption of 4-5 meals/snacks    Improve shortness of breath with ADL's Yes    Intervention Provide education, individualized exercise plan and daily activity instruction to help decrease symptoms of SOB with activities of daily living.    Expected Outcomes Short Term: Improve cardiorespiratory fitness to achieve a reduction of symptoms  when performing ADLs;Long Term: Be able to perform more ADLs without symptoms or delay the onset of symptoms    Increase knowledge of respiratory medications and ability to use respiratory devices properly  Yes    Intervention Provide education and demonstration as needed of appropriate use of medications, inhalers, and oxygen therapy.    Expected Outcomes Short Term: Achieves understanding of medications use. Understands that oxygen is a medication prescribed by physician. Demonstrates appropriate use of inhaler and oxygen therapy.;Long Term: Maintain appropriate use of medications, inhalers, and oxygen therapy.    Hypertension Yes    Intervention Provide education on lifestyle modifcations including regular physical activity/exercise, weight management, moderate sodium restriction and increased consumption of fresh fruit, vegetables, and low fat dairy, alcohol moderation, and smoking cessation.;Monitor prescription use compliance.    Expected Outcomes Short Term: Continued assessment and intervention until BP is < 140/3mm HG in hypertensive participants. < 130/12mm HG in hypertensive participants with diabetes, heart failure or chronic kidney disease.;Long Term: Maintenance of blood pressure at goal levels.             Tobacco Use Initial Evaluation: Social History   Tobacco Use  Smoking Status Former   Packs/day: 0.75   Years: 20.00   Additional pack years: 0.00   Total pack years: 15.00   Types: Cigarettes  Smokeless Tobacco Former   Quit date: 10/31/1998    Exercise Goals and Review:  Exercise Goals     Row Name 09/24/22 1445             Exercise Goals   Increase Physical Activity Yes       Intervention Provide advice, education, support and counseling about physical activity/exercise needs.;Develop an individualized exercise prescription for aerobic and resistive training based on initial evaluation findings, risk stratification, comorbidities and participant's personal  goals.       Expected Outcomes Short Term: Attend rehab on a regular basis to increase amount of physical activity.;Long Term: Add in home exercise to make exercise part of routine and to increase amount of physical activity.;Long Term: Exercising regularly at least 3-5 days a week.       Increase Strength and Stamina Yes       Intervention Provide advice, education, support and counseling about physical activity/exercise needs.;Develop an individualized exercise prescription for aerobic and resistive training based on initial evaluation findings, risk stratification, comorbidities and participant's personal goals.       Expected Outcomes Short Term: Increase workloads from initial exercise prescription for resistance, speed, and METs.;Short Term: Perform resistance training exercises routinely during rehab and add in resistance training at home;Long Term: Improve cardiorespiratory fitness, muscular endurance and strength as measured by increased METs and functional capacity ( )       Able to understand and use rate of perceived exertion (RPE) scale Yes       Intervention Provide education  and explanation on how to use RPE scale       Expected Outcomes Short Term: Able to use RPE daily in rehab to express subjective intensity level;Long Term:  Able to use RPE to guide intensity level when exercising independently       Able to understand and use Dyspnea scale Yes       Intervention Provide education and explanation on how to use Dyspnea scale       Expected Outcomes Short Term: Able to use Dyspnea scale daily in rehab to express subjective sense of shortness of breath during exertion;Long Term: Able to use Dyspnea scale to guide intensity level when exercising independently       Knowledge and understanding of Target Heart Rate Range (THRR) Yes       Intervention Provide education and explanation of THRR including how the numbers were predicted and where they are located for reference       Expected  Outcomes Short Term: Able to state/look up THRR;Short Term: Able to use daily as guideline for intensity in rehab;Long Term: Able to use THRR to govern intensity when exercising independently       Able to check pulse independently Yes       Intervention Provide education and demonstration on how to check pulse in carotid and radial arteries.;Review the importance of being able to check your own pulse for safety during independent exercise       Expected Outcomes Short Term: Able to explain why pulse checking is important during independent exercise;Long Term: Able to check pulse independently and accurately       Understanding of Exercise Prescription Yes       Intervention Provide education, explanation, and written materials on patient's individual exercise prescription       Expected Outcomes Short Term: Able to explain program exercise prescription;Long Term: Able to explain home exercise prescription to exercise independently              Copy of goals given to participant.

## 2022-09-24 NOTE — Progress Notes (Signed)
Pulmonary Individual Treatment Plan  Patient Details  Name: FINOLA STATZER MRN: [id number] Date of Birth: [birth year] Referring Provider:   Flowsheet Row PULMONARY REHAB OTHER RESP ORIENTATION from 09/24/2022 in [facility]  Referring Provider Warnell Forester MD       Initial Encounter Date:  Flowsheet Row PULMONARY REHAB OTHER RESP ORIENTATION from 09/24/2022 in [place] [facility]  Date 09/24/22       Visit Diagnosis: Chronic obstructive pulmonary disease, unspecified COPD type (HCC)  Patient's Home Medications on Admission:   Current Outpatient Medications:    acetaminophen (TYLENOL) 500 MG tablet, Take 500-1,000 mg by mouth every 6 (six) hours as needed (pain.)., Disp: , Rfl:    albuterol (VENTOLIN HFA) 108 (90 Base) MCG/ACT inhaler, Inhale 2 puffs into the lungs every 6 (six) hours as needed for wheezing or shortness of breath., Disp: , Rfl:    azelastine (ASTELIN) 0.1 % nasal spray, Place 2 sprays into both nostrils 2 (two) times daily as needed for allergies. Use in each nostril as directed, Disp: , Rfl:    budesonide-formoterol (SYMBICORT) 160-4.5 MCG/ACT inhaler, Inhale 2 puffs into the lungs 2 (two) times daily., Disp: , Rfl:    Cholecalciferol (VITAMIN D) 125 MCG (5000 UT) CAPS, Take 5,000 Units by mouth in the morning., Disp: , Rfl:    clotrimazole-betamethasone (LOTRISONE) cream, Apply 1 application topically 2 (two) times daily as needed (skin irritation.). , Disp: , Rfl:    diclofenac (VOLTAREN) 75 MG EC tablet, Take 75 mg by mouth daily as needed (arthritis pain)., Disp: , Rfl:    ezetimibe (ZETIA) 10 MG tablet, Take 10 mg by mouth in the morning., Disp: , Rfl:    famotidine (PEPCID) 40 MG tablet, Take 40 mg by mouth at bedtime., Disp: , Rfl:    fluticasone (FLONASE) 50 MCG/ACT nasal spray, Place 2 sprays into both nostrils daily as needed for allergies., Disp: , Rfl:    guaiFENesin (MUCINEX) 600 MG 12 hr tablet, Take 600 mg by mouth  daily as needed (congestion/COPD issues)., Disp: , Rfl:    hydrochlorothiazide (HYDRODIURIL) 25 MG tablet, Take 25 mg by mouth in the morning., Disp: , Rfl:    Ketotifen Fumarate (ALLERGY EYE DROPS OP), Place 1 drop into both eyes 2 (two) times daily as needed (allergy eyes)., Disp: , Rfl:    loratadine (CLARITIN) 10 MG tablet, Take 10 mg by mouth daily as needed for allergies., Disp: , Rfl:    losartan (COZAAR) 100 MG tablet, Take 100 mg by mouth in the morning., Disp: , Rfl:    montelukast (SINGULAIR) 10 MG tablet, Take 10 mg by mouth at bedtime., Disp: , Rfl:    Multiple Vitamins-Minerals (PRESERVISION AREDS 2 PO), Take 1 tablet by mouth in the morning and at bedtime., Disp: , Rfl:    NON FORMULARY, CPAP, O2 at night, Disp: , Rfl:    omeprazole (PRILOSEC) 40 MG capsule, Take 40 mg by mouth daily before breakfast., Disp: , Rfl:    sertraline (ZOLOFT) 100 MG tablet, Take 100 mg by mouth at bedtime., Disp: , Rfl:    terbinafine (LAMISIL) 250 MG tablet, Take 250 mg by mouth in the morning., Disp: , Rfl:   Past Medical History: Past Medical History:  Diagnosis Date   Anxiety    Arthritis    COPD (chronic obstructive pulmonary disease) (HCC)    Heart murmur    Hypertension    Lung nodule    LLL   Sleep apnea  CPAP with O2 2 liters    Tubal ectopic pregnancy 1970   Vitelliform macular dystrophy     Tobacco Use: Social History   Tobacco Use  Smoking Status Former   Packs/day: 0.75   Years: 20.00   Additional pack years: 0.00   Total pack years: 15.00   Types: Cigarettes  Smokeless Tobacco Former   Quit date: 10/31/1998    Labs: Review Flowsheet        No data to display          Capillary Blood Glucose: No results found for: "GLUCAP"   Pulmonary Assessment Scores:  Pulmonary Assessment Scores     Row Name 09/24/22 1454         ADL UCSD   ADL Phase Entry     SOB Score total 64     Rest 1     Walk 3     Stairs 4     Bath 3     Dress 1     Shop 4        CAT Score   CAT Score 18       mMRC Score   mMRC Score 3             UCSD: Self-administered rating of dyspnea associated with activities of daily living (ADLs) 6-point scale (0 = "not at all" to 5 = "maximal or unable to do because of breathlessness")  Scoring Scores range from 0 to 120.  Minimally important difference is 5 units  CAT: CAT can identify the health impairment of COPD patients and is better correlated with disease progression.  CAT has a scoring range of zero to 40. The CAT score is classified into four groups of low (less than 10), medium (10 - 20), high (21-30) and very high (31-40) based on the impact level of disease on health status. A CAT score over 10 suggests significant symptoms.  A worsening CAT score could be explained by an exacerbation, poor medication adherence, poor inhaler technique, or progression of COPD or comorbid conditions.  CAT MCID is 2 points  mMRC: mMRC (Modified Medical Research Council) Dyspnea Scale is used to assess the degree of baseline functional disability in patients of respiratory disease due to dyspnea. No minimal important difference is established. A decrease in score of 1 point or greater is considered a positive change.   Pulmonary Function Assessment:  Pulmonary Function Assessment - 09/24/22 1454       Breath   Shortness of Breath Yes;Fear of Shortness of Breath;Limiting activity             Exercise Target Goals: Exercise Program Goal: Individual exercise prescription set using results from initial 6 min walk test and THRR while considering  patient's activity barriers and safety.   Exercise Prescription Goal: Initial exercise prescription builds to 30-45 minutes a day of aerobic activity, 2-3 days per week.  Home exercise guidelines will be given to patient during program as part of exercise prescription that the participant will acknowledge.  Activity Barriers & Risk Stratification:  Activity Barriers & Cardiac  Risk Stratification - 09/24/22 1250       Activity Barriers & Cardiac Risk Stratification   Activity Barriers Arthritis;Deconditioning;Muscular Weakness;Shortness of Breath;Joint Problems;Balance Concerns   currently in PT for foot            6 Minute Walk:  6 Minute Walk     Row Name 09/24/22 1428         6 Minute  Walk   Phase Initial     Distance 675 feet     Walk Time 4.4 minutes     # of Rest Breaks 2  46 sec, 38 sec     MPH 1.74     METS 1.01     RPE 13     Perceived Dyspnea  16     VO2 Peak 3.53     Symptoms Yes (comment)     Comments hip pain 7/10     Resting HR 78 bpm     Resting BP 128/70     Resting Oxygen Saturation  96 %     Exercise Oxygen Saturation  during 6 min walk 88 %     Max Ex. HR 116 bpm     Max Ex. BP 204/112     2 Minute Post BP 144/72       Interval HR   1 Minute HR 108     2 Minute HR 100  seated     3 Minute HR 101     4 Minute HR 116     5 Minute HR 107     6 Minute HR 109     2 Minute Post HR 71     Interval Heart Rate? Yes       Interval Oxygen   Interval Oxygen? Yes     Baseline Oxygen Saturation % 96 %     1 Minute Oxygen Saturation % 90 %     1 Minute Liters of Oxygen 0 L  Room Air     2 Minute Oxygen Saturation % 90 %  seated     2 Minute Liters of Oxygen 0 L     3 Minute Oxygen Saturation % 88 %     3 Minute Liters of Oxygen 0 L     4 Minute Oxygen Saturation % 89 %     4 Minute Liters of Oxygen 0 L     5 Minute Oxygen Saturation % 88 %     5 Minute Liters of Oxygen 0 L     6 Minute Oxygen Saturation % 89 %     6 Minute Liters of Oxygen 0 L     2 Minute Post Oxygen Saturation % 95 %     2 Minute Post Liters of Oxygen 0 L              Oxygen Initial Assessment:  Oxygen Initial Assessment - 09/24/22 1249       Home Oxygen   Home Oxygen Device Home Concentrator    Sleep Oxygen Prescription Continuous;CPAP    Liters per minute 2    Home Exercise Oxygen Prescription None    Home Resting Oxygen Prescription  None    Compliance with Home Oxygen Use Yes      Initial 6 min Walk   Oxygen Used None      Program Oxygen Prescription   Program Oxygen Prescription None      Intervention   Short Term Goals To learn and exhibit compliance with exercise, home and travel O2 prescription;To learn and understand importance of monitoring SPO2 with pulse oximeter and demonstrate accurate use of the pulse oximeter.;To learn and understand importance of maintaining oxygen saturations>88%;To learn and demonstrate proper pursed lip breathing techniques or other breathing techniques. ;To learn and demonstrate proper use of respiratory medications    Long  Term Goals Exhibits compliance with exercise, home  and travel O2 prescription;Verbalizes importance of monitoring  SPO2 with pulse oximeter and return demonstration;Maintenance of O2 saturations>88%;Exhibits proper breathing techniques, such as pursed lip breathing or other method taught during program session;Compliance with respiratory medication;Demonstrates proper use of MDI's             Oxygen Re-Evaluation:  Oxygen Re-Evaluation     Row Name 09/24/22 1446             Program Oxygen Prescription   Program Oxygen Prescription None         Home Oxygen   Home Oxygen Device Home Concentrator       Sleep Oxygen Prescription Continuous;CPAP       Liters per minute 2       Home Exercise Oxygen Prescription None       Home Resting Oxygen Prescription None       Compliance with Home Oxygen Use Yes         Goals/Expected Outcomes   Short Term Goals To learn and demonstrate proper pursed lip breathing techniques or other breathing techniques. ;To learn and exhibit compliance with exercise, home and travel O2 prescription;To learn and understand importance of monitoring SPO2 with pulse oximeter and demonstrate accurate use of the pulse oximeter.;To learn and understand importance of maintaining oxygen saturations>88%;To learn and demonstrate proper use of  respiratory medications       Long  Term Goals Exhibits proper breathing techniques, such as pursed lip breathing or other method taught during program session;Exhibits compliance with exercise, home  and travel O2 prescription;Verbalizes importance of monitoring SPO2 with pulse oximeter and return demonstration;Maintenance of O2 saturations>88%;Compliance with respiratory medication;Demonstrates proper use of MDI's       Comments Reviewed PLB technique with pt.  Talked about how it works and it's importance in maintaining their exercise saturations.       Goals/Expected Outcomes Short: Become more profiecient at using PLB.   Long: Become independent at using PLB.                Oxygen Discharge (Final Oxygen Re-Evaluation):  Oxygen Re-Evaluation - 09/24/22 1446       Program Oxygen Prescription   Program Oxygen Prescription None      Home Oxygen   Home Oxygen Device Home Concentrator    Sleep Oxygen Prescription Continuous;CPAP    Liters per minute 2    Home Exercise Oxygen Prescription None    Home Resting Oxygen Prescription None    Compliance with Home Oxygen Use Yes      Goals/Expected Outcomes   Short Term Goals To learn and demonstrate proper pursed lip breathing techniques or other breathing techniques. ;To learn and exhibit compliance with exercise, home and travel O2 prescription;To learn and understand importance of monitoring SPO2 with pulse oximeter and demonstrate accurate use of the pulse oximeter.;To learn and understand importance of maintaining oxygen saturations>88%;To learn and demonstrate proper use of respiratory medications    Long  Term Goals Exhibits proper breathing techniques, such as pursed lip breathing or other method taught during program session;Exhibits compliance with exercise, home  and travel O2 prescription;Verbalizes importance of monitoring SPO2 with pulse oximeter and return demonstration;Maintenance of O2 saturations>88%;Compliance with respiratory  medication;Demonstrates proper use of MDI's    Comments Reviewed PLB technique with pt.  Talked about how it works and it's importance in maintaining their exercise saturations.    Goals/Expected Outcomes Short: Become more profiecient at using PLB.   Long: Become independent at using PLB.  Initial Exercise Prescription:  Initial Exercise Prescription - 09/24/22 1400       Date of Initial Exercise RX and Referring Provider   Date 09/24/22    Referring Provider Warnell Forester MD      Oxygen   Maintain Oxygen Saturation 88% or higher      Treadmill   MPH 1.5    Grade 0.5    Minutes 17    METs 2.25      NuStep   Level 1    SPM 80    Minutes 17    METs 1.5      REL-XR   Level 1    Watts 20    Speed 50    Minutes 15    METs 1.5      Prescription Details   Frequency (times per week) 3    Duration Progress to 30 minutes of continuous aerobic without signs/symptoms of physical distress      Intensity   THRR 40-80% of Max Heartrate 106-133    Ratings of Perceived Exertion 11-13    Perceived Dyspnea 0-4      Progression   Progression Continue to progress workloads to maintain intensity without signs/symptoms of physical distress.      Resistance Training   Training Prescription Yes    Weight 3 lb    Reps 10-15             Perform Capillary Blood Glucose checks as needed.  Exercise Prescription Changes:   Exercise Prescription Changes     Row Name 09/24/22 1400             Response to Exercise   Blood Pressure (Admit) 128/70       Blood Pressure (Exercise) 204/112       Blood Pressure (Exit) 134/74       Heart Rate (Admit) 78 bpm       Heart Rate (Exercise) 116 bpm       Heart Rate (Exit) 87 bpm       Oxygen Saturation (Admit) 96 %       Oxygen Saturation (Exercise) 88 %       Oxygen Saturation (Exit) 91 %       Rating of Perceived Exertion (Exercise) 13       Perceived Dyspnea (Exercise) 16       Symptoms hip pain 7/10        Comments walk test orientation                Exercise Comments:   Exercise Goals and Review:   Exercise Goals     Row Name 09/24/22 1445             Exercise Goals   Increase Physical Activity Yes       Intervention Provide advice, education, support and counseling about physical activity/exercise needs.;Develop an individualized exercise prescription for aerobic and resistive training based on initial evaluation findings, risk stratification, comorbidities and participant's personal goals.       Expected Outcomes Short Term: Attend rehab on a regular basis to increase amount of physical activity.;Long Term: Add in home exercise to make exercise part of routine and to increase amount of physical activity.;Long Term: Exercising regularly at least 3-5 days a week.       Increase Strength and Stamina Yes       Intervention Provide advice, education, support and counseling about physical activity/exercise needs.;Develop an individualized exercise prescription for aerobic and resistive training based on initial  evaluation findings, risk stratification, comorbidities and participant's personal goals.       Expected Outcomes Short Term: Increase workloads from initial exercise prescription for resistance, speed, and METs.;Short Term: Perform resistance training exercises routinely during rehab and add in resistance training at home;Long Term: Improve cardiorespiratory fitness, muscular endurance and strength as measured by increased METs and functional capacity ( )       Able to understand and use rate of perceived exertion (RPE) scale Yes       Intervention Provide education and explanation on how to use RPE scale       Expected Outcomes Short Term: Able to use RPE daily in rehab to express subjective intensity level;Long Term:  Able to use RPE to guide intensity level when exercising independently       Able to understand and use Dyspnea scale Yes       Intervention Provide  education and explanation on how to use Dyspnea scale       Expected Outcomes Short Term: Able to use Dyspnea scale daily in rehab to express subjective sense of shortness of breath during exertion;Long Term: Able to use Dyspnea scale to guide intensity level when exercising independently       Knowledge and understanding of Target Heart Rate Range (THRR) Yes       Intervention Provide education and explanation of THRR including how the numbers were predicted and where they are located for reference       Expected Outcomes Short Term: Able to state/look up THRR;Short Term: Able to use daily as guideline for intensity in rehab;Long Term: Able to use THRR to govern intensity when exercising independently       Able to check pulse independently Yes       Intervention Provide education and demonstration on how to check pulse in carotid and radial arteries.;Review the importance of being able to check your own pulse for safety during independent exercise       Expected Outcomes Short Term: Able to explain why pulse checking is important during independent exercise;Long Term: Able to check pulse independently and accurately       Understanding of Exercise Prescription Yes       Intervention Provide education, explanation, and written materials on patient's individual exercise prescription       Expected Outcomes Short Term: Able to explain program exercise prescription;Long Term: Able to explain home exercise prescription to exercise independently                Exercise Goals Re-Evaluation :  Exercise Goals Re-Evaluation     Row Name 09/24/22 1445             Exercise Goal Re-Evaluation   Exercise Goals Review Able to understand and use rate of perceived exertion (RPE) scale;Able to understand and use Dyspnea scale       Comments Reviewed RPE  and dyspnea scale and program prescription with pt today.  Pt voiced understanding and was given a copy of goals to take home.       Expected Outcomes  Short: Use RPE daily to regulate intensity.  Long: Follow program prescription                Discharge Exercise Prescription (Final Exercise Prescription Changes):  Exercise Prescription Changes - 09/24/22 1400       Response to Exercise   Blood Pressure (Admit) 128/70    Blood Pressure (Exercise) 204/112    Blood Pressure (Exit) 134/74    Heart  Rate (Admit) 78 bpm    Heart Rate (Exercise) 116 bpm    Heart Rate (Exit) 87 bpm    Oxygen Saturation (Admit) 96 %    Oxygen Saturation (Exercise) 88 %    Oxygen Saturation (Exit) 91 %    Rating of Perceived Exertion (Exercise) 13    Perceived Dyspnea (Exercise) 16    Symptoms hip pain 7/10    Comments walk test orientation             Nutrition:  Target Goals: Understanding of nutrition guidelines, daily intake of sodium 1500mg , cholesterol 200mg , calories 30% from fat and 7% or less from saturated fats, daily to have 5 or more servings of fruits and vegetables.  Biometrics:    Nutrition Therapy Plan and Nutrition Goals:  Nutrition Therapy & Goals - 09/24/22 1255       Intervention Plan   Intervention Prescribe, educate and counsel regarding individualized specific dietary modifications aiming towards targeted core components such as weight, hypertension, lipid management, diabetes, heart failure and other comorbidities.;Nutrition handout(s) given to patient.    Expected Outcomes Short Term Goal: Understand basic principles of dietary content, such as calories, fat, sodium, cholesterol and nutrients.;Short Term Goal: A plan has been developed with personal nutrition goals set during dietitian appointment.             Nutrition Assessments:  Nutrition Assessments - 09/24/22 1447       MEDFICTS Scores   Pre Score 50            MEDIFICTS Score Key: ?70 Need to make dietary changes  40-70 Heart Healthy Diet ? 40 Therapeutic Level Cholesterol Diet   Picture Your Plate Scores: <16 Unhealthy dietary  pattern with much room for improvement. 41-50 Dietary pattern unlikely to meet recommendations for good health and room for improvement. 51-60 More healthful dietary pattern, with some room for improvement.  >60 Healthy dietary pattern, although there may be some specific behaviors that could be improved.    Nutrition Goals Re-Evaluation:   Nutrition Goals Discharge (Final Nutrition Goals Re-Evaluation):   Psychosocial: Target Goals: Acknowledge presence or absence of significant depression and/or stress, maximize coping skills, provide positive support system. Participant is able to verbalize types and ability to use techniques and skills needed for reducing stress and depression.  Initial Review & Psychosocial Screening:  Initial Psych Review & Screening - 09/24/22 1251       Initial Review   Current issues with Current Psychotropic Meds;Current Depression;Current Sleep Concerns;History of Depression      Family Dynamics   Good Support System? Yes   husband (doesn't help with diet), kids     Barriers   Psychosocial barriers to participate in program The patient should benefit from training in stress management and relaxation.;Psychosocial barriers identified (see note)      Screening Interventions   Interventions Encouraged to exercise;Provide feedback about the scores to participant;To provide support and resources with identified psychosocial needs    Expected Outcomes Short Term goal: Identification and review with participant of any Quality of Life or Depression concerns found by scoring the questionnaire.;Long Term Goal: Stressors or current issues are controlled or eliminated.;Long Term goal: The participant improves quality of Life and PHQ9 Scores as seen by post scores and/or verbalization of changes;Short Term goal: Utilizing psychosocial counselor, staff and physician to assist with identification of specific Stressors or current issues interfering with healing process.  Setting desired goal for each stressor or current issue identified.  Quality of Life Scores:  Quality of Life - 09/24/22 1503       Quality of Life   Select Quality of Life      Quality of Life Scores   Health/Function Pre 13.41 %    Socioeconomic Pre 21.08 %    Psych/Spiritual Pre 17.43 %    Family Pre 24 %    GLOBAL Pre 16.94 %            Scores of 19 and below usually indicate a poorer quality of life in these areas.  A difference of  2-3 points is a clinically meaningful difference.  A difference of 2-3 points in the total score of the Quality of Life Index has been associated with significant improvement in overall quality of life, self-image, physical symptoms, and general health in studies assessing change in quality of life.   PHQ-9: Review Flowsheet       09/24/2022 04/30/2022 12/06/2021  Depression screen PHQ 2/9  Decreased Interest 1 1 2   Down, Depressed, Hopeless 1 1 0  PHQ - 2 Score 2 2 2   Altered sleeping 0 1 1  Tired, decreased energy 2 2 2   Change in appetite 2 2 2   Feeling bad or failure about yourself  0 1 0  Trouble concentrating 0 0 0  Moving slowly or fidgety/restless 1 0 0  Suicidal thoughts 0 0 0  PHQ-9 Score 7 8 7   Difficult doing work/chores Very difficult Not difficult at all Somewhat difficult   Interpretation of Total Score  Total Score Depression Severity:  1-4 = Minimal depression, 5-9 = Mild depression, 10-14 = Moderate depression, 15-19 = Moderately severe depression, 20-27 = Severe depression   Psychosocial Evaluation and Intervention:   Psychosocial Re-Evaluation:   Psychosocial Discharge (Final Psychosocial Re-Evaluation):    Education: Education Goals: Education classes will be provided on a weekly basis, covering required topics. Participant will state understanding/return demonstration of topics presented.  Learning Barriers/Preferences:  Learning Barriers/Preferences - 09/24/22 1248       Learning  Barriers/Preferences   Learning Barriers Sight   glasses   Learning Preferences Written Material;Skilled Demonstration             Education Topics: How Lungs Work and Diseases: - Discuss the anatomy of the lungs and diseases that can affect the lungs, such as COPD. Flowsheet Row PULMONARY REHAB OTHER RESPIRATORY from 04/25/2022 in [facility]  Date 12/27/21  Educator DF  Instruction Review Code 1- Verbalizes Understanding       Exercise: -Discuss the importance of exercise, FITT principles of exercise, normal and abnormal responses to exercise, and how to exercise safely.   Environmental Irritants: -Discuss types of environmental irritants and how to limit exposure to environmental irritants. Flowsheet Row PULMONARY REHAB OTHER RESPIRATORY from 04/25/2022 in [facility]  Date 04/04/22  Educator HB  Instruction Review Code 1- Verbalizes Understanding       Meds/Inhalers and oxygen: - Discuss respiratory medications, definition of an inhaler and oxygen, and the proper way to use an inhaler and oxygen. Flowsheet Row PULMONARY REHAB OTHER RESPIRATORY from 04/25/2022 in [facility]  Date 01/10/22  Educator HJ       Energy Saving Techniques: - Discuss methods to conserve energy and decrease shortness of breath when performing activities of daily living.  Flowsheet Row PULMONARY REHAB OTHER RESPIRATORY from 04/25/2022 in [place] [facility]  Date 01/17/22  Educator Handout  Instruction Review Code 1- Verbalizes Understanding  Bronchial Hygiene / Breathing Techniques: - Discuss breathing mechanics, pursed-lip breathing technique,  proper posture, effective ways to clear airways, and other functional breathing techniques Flowsheet Row PULMONARY REHAB OTHER RESPIRATORY from 04/25/2022 in [place] [facility]  Date 01/24/22  Educator HB  Instruction Review Code 1- Oceanographer: - Provides group verbal and written instruction about the health risks of elevated stress, cause of high stress, and healthy ways to reduce stress.   Nutrition I: Fats: - Discuss the types of cholesterol, what cholesterol does to the body, and how cholesterol levels can be controlled. Flowsheet Row PULMONARY REHAB OTHER RESPIRATORY from 04/25/2022 in [place] [facility]  Date 02/07/22  Educator handout  Instruction Review Code 1- Verbalizes Understanding       Nutrition II: Labels: -Discuss the different components of food labels and how to read food labels. Flowsheet Row PULMONARY REHAB OTHER RESPIRATORY from 04/25/2022 in [place] [facility]  Date 02/21/22  Educator HB  Instruction Review Code 1- Verbalizes Understanding       Respiratory Infections: - Discuss the signs and symptoms of respiratory infections, ways to prevent respiratory infections, and the importance of seeking medical treatment when having a respiratory infection.   Stress I: Signs and Symptoms: - Discuss the causes of stress, how stress may lead to anxiety and depression, and ways to limit stress. Flowsheet Row PULMONARY REHAB OTHER RESPIRATORY from 04/25/2022 in [place] [facility]  Date 03/07/22  Educator DF  Instruction Review Code 2- Demonstrated Understanding       Stress II: Relaxation: -Discuss relaxation techniques to limit stress. Flowsheet Row PULMONARY REHAB OTHER RESPIRATORY from 04/25/2022 in [place] [facility]  Date 12/13/21  Educator DM  Instruction Review Code 1- Verbalizes Understanding       Oxygen for Home/Travel: - Discuss how to prepare for travel when on oxygen and proper ways to transport and store oxygen to ensure safety. Flowsheet Row PULMONARY REHAB OTHER RESPIRATORY from 04/25/2022 in [place] [facility]  Date 12/20/21  Educator DF  Instruction Review Code 1-  Verbalizes Understanding       Knowledge Questionnaire Score:  Knowledge Questionnaire Score - 09/24/22 1452       Knowledge Questionnaire Score   Pre Score 16/18   missed O2 safety and saturations            Core Components/Risk Factors/Patient Goals at Admission:  Personal Goals and Risk Factors at Admission - 09/24/22 1255       Core Components/Risk Factors/Patient Goals on Admission    Weight Management Yes;Obesity;Weight Loss    Intervention Weight Management: Provide education and appropriate resources to help participant work on and attain dietary goals.;Weight Management/Obesity: Establish reasonable short term and long term weight goals.;Obesity: Provide education and appropriate resources to help participant work on and attain dietary goals.    Admit Weight 282 lb 6.4 oz (128.1 kg)    Goal Weight: Short Term 277 lb (125.6 kg)    Goal Weight: Long Term 272 lb (123.4 kg)    Expected Outcomes Short Term: Continue to assess and modify interventions until short term weight is achieved;Long Term: Adherence to nutrition and physical activity/exercise program aimed toward attainment of established weight goal;Weight Loss: Understanding of general recommendations for a balanced deficit meal plan, which promotes 1-2 lb weight loss per week and includes a negative energy balance of [phone] kcal/d;Understanding recommendations for meals to include 15-35% energy as protein, 25-35% energy  from fat, 35-60% energy from carbohydrates, less than 200mg  of dietary cholesterol, 20-35 gm of total fiber daily;Understanding of distribution of calorie intake throughout the day with the consumption of 4-5 meals/snacks    Improve shortness of breath with ADL's Yes    Intervention Provide education, individualized exercise plan and daily activity instruction to help decrease symptoms of SOB with activities of daily living.    Expected Outcomes Short Term: Improve cardiorespiratory fitness to achieve a  reduction of symptoms when performing ADLs;Long Term: Be able to perform more ADLs without symptoms or delay the onset of symptoms    Increase knowledge of respiratory medications and ability to use respiratory devices properly  Yes    Intervention Provide education and demonstration as needed of appropriate use of medications, inhalers, and oxygen therapy.    Expected Outcomes Short Term: Achieves understanding of medications use. Understands that oxygen is a medication prescribed by physician. Demonstrates appropriate use of inhaler and oxygen therapy.;Long Term: Maintain appropriate use of medications, inhalers, and oxygen therapy.    Hypertension Yes    Intervention Provide education on lifestyle modifcations including regular physical activity/exercise, weight management, moderate sodium restriction and increased consumption of fresh fruit, vegetables, and low fat dairy, alcohol moderation, and smoking cessation.;Monitor prescription use compliance.    Expected Outcomes Short Term: Continued assessment and intervention until BP is < 140/25mm HG in hypertensive participants. < 130/84mm HG in hypertensive participants with diabetes, heart failure or chronic kidney disease.;Long Term: Maintenance of blood pressure at goal levels.             Core Components/Risk Factors/Patient Goals Review:    Core Components/Risk Factors/Patient Goals at Discharge (Final Review):    ITP Comments:  ITP Comments     Row Name 09/24/22 1419           ITP Comments Pt attended orientation today for Pulmonary Rehab. Completed and gym orientation. Documentation for diagnosis can be found in CE office visit 08/05/22.  Initial ITP created and sent for review to Dr. York Grice, Medical Director. Pt service time [phone].                Comments: Initial ITP

## 2022-09-25 NOTE — Addendum Note (Signed)
Encounter addended by: Hazle Nordmann on: 09/25/2022 8:53 AM  Actions taken: Flowsheet accepted, Care Teams modified, Charge Capture section accepted

## 2022-09-30 ENCOUNTER — Ambulatory Visit (HOSPITAL_COMMUNITY): Payer: Medicare Other

## 2022-09-30 ENCOUNTER — Encounter (HOSPITAL_COMMUNITY)
Admission: RE | Admit: 2022-09-30 | Discharge: 2022-09-30 | Disposition: A | Discharge status: Home / Self Care | Payer: Medicare Other | Source: Ambulatory Visit | Attending: Internal Medicine | Admitting: Internal Medicine

## 2022-09-30 DIAGNOSIS — J449 Chronic obstructive pulmonary disease, unspecified: Secondary | ICD-10-CM

## 2022-09-30 NOTE — Progress Notes (Signed)
Daily Session Note  Patient Details  Name: Paula [name] MRN: [id number] Date of Birth: 08-07-1950 Referring Provider:   Flowsheet Row PULMONARY REHAB OTHER RESP ORIENTATION from 09/24/2022 in [facility]  Referring Provider Warnell Forester MD       Encounter Date: 09/30/2022  Check In:  Session Check In - 09/30/22 1100       Check-In   Supervising physician immediately available to respond to emergencies [facility] MD immediately available    Physician(s) Dr. Diona Browner    Location [facility]    Staff Present Ross Ludwig, BS, Exercise Physiologist;Tearah Saulsbury, Margarite Gouge, RN, BSN;Other    Virtual Visit No    Medication changes reported     No    Fall or balance concerns reported    No    Tobacco Cessation No Change    Warm-up and Cool-down Performed as group-led instruction    Resistance Training Performed No    VAD Patient? No    PAD/SET Patient? No      Pain Assessment   Currently in Pain? No/denies    Multiple Pain Sites No             Capillary Blood Glucose: No results found for this or any previous visit (from the past 24 hour(s)).    Social History   Tobacco Use  Smoking Status Former   Packs/day: 0.75   Years: 20.00   Additional pack years: 0.00   Total pack years: 15.00   Types: Cigarettes  Smokeless Tobacco Former   Quit date: 10/31/1998    Goals Met:  Independence with exercise equipment Exercise tolerated well No report of concerns or symptoms today Strength training completed today  Goals Unmet:  Not Applicable  Comments: check out @ 12:00   Dr. Erick Blinks is Medical Director for [facility] Pulmonary [facility].

## 2022-10-02 ENCOUNTER — Ambulatory Visit (HOSPITAL_COMMUNITY): Payer: Medicare Other

## 2022-10-02 ENCOUNTER — Encounter (HOSPITAL_COMMUNITY): Payer: Medicare Other

## 2022-10-02 DIAGNOSIS — J449 Chronic obstructive pulmonary disease, unspecified: Secondary | ICD-10-CM | POA: Diagnosis not present

## 2022-10-02 NOTE — Progress Notes (Addendum)
Daily Session Note  Patient Details  Name: Paula [name] MRN: [id number] Date of Birth: 25-Jul-1950 Referring Provider:   Flowsheet Row PULMONARY REHAB OTHER RESP ORIENTATION from 09/24/2022 in [facility]  Referring Provider Warnell Forester MD       Encounter Date: 10/02/2022  Check In:  Session Check In - 10/02/22 1058       Check-In   Supervising physician immediately available to respond to emergencies [facility] MD immediately available    Physician(s) Dr. Diona Browner    Location [facility]    Staff Present Fabio Pierce, MA, RCEP, CCRP, Dow Adolph, RN, BSN    Virtual Visit No    Medication changes reported     No    Fall or balance concerns reported    No    Warm-up and Cool-down Performed on first and last piece of equipment    VAD Patient? No    PAD/SET Patient? No      Pain Assessment   Currently in Pain? No/denies    Multiple Pain Sites No             Capillary Blood Glucose: No results found for this or any previous visit (from the past 24 hour(s)).    Social History   Tobacco Use  Smoking Status Former   Packs/day: 0.75   Years: 20.00   Additional pack years: 0.00   Total pack years: 15.00   Types: Cigarettes  Smokeless Tobacco Former   Quit date: 10/31/1998    Goals Met:  Proper associated with RPD/PD & O2 Sat Independence with exercise equipment Using PLB without cueing & demonstrates good technique Exercise tolerated well No report of concerns or symptoms today Strength training completed today  Goals Unmet:  Not Applicable  Comments: Pt able to follow exercise prescription today without complaint.  Will continue to monitor for progression.    Dr. Erick Blinks is Medical Director for [facility] Pulmonary Rehab.

## 2022-10-04 ENCOUNTER — Ambulatory Visit (HOSPITAL_COMMUNITY): Payer: Medicare Other

## 2022-10-04 ENCOUNTER — Encounter (HOSPITAL_COMMUNITY): Payer: Medicare Other

## 2022-10-07 ENCOUNTER — Encounter (HOSPITAL_COMMUNITY)
Admission: RE | Admit: 2022-10-07 | Discharge: 2022-10-07 | Disposition: A | Discharge status: Home / Self Care | Payer: Medicare Other | Source: Ambulatory Visit | Attending: Internal Medicine | Admitting: Internal Medicine

## 2022-10-07 ENCOUNTER — Ambulatory Visit (HOSPITAL_COMMUNITY): Payer: Medicare Other

## 2022-10-07 DIAGNOSIS — J449 Chronic obstructive pulmonary disease, unspecified: Secondary | ICD-10-CM | POA: Diagnosis not present

## 2022-10-07 NOTE — Progress Notes (Signed)
Daily Session Note  Patient Details  Name: Paula [name] MRN: [id number] Date of Birth: 09/14/1950 Referring Provider:   Flowsheet Row PULMONARY REHAB OTHER RESP ORIENTATION from 09/24/2022 in [facility]  Referring Provider Warnell Forester MD       Encounter Date: 10/07/2022  Check In:  Session Check In - 10/07/22 1056       Check-In   Supervising physician immediately available to respond to emergencies [facility] MD immediately available    Physician(s) Dr. Jenene Slicker    Location [facility]    Staff Present Ross Ludwig, BS, Exercise Physiologist;Daphyne Daphine Deutscher, RN, BSN    Virtual Visit No    Medication changes reported     No    Fall or balance concerns reported    No    Tobacco Cessation No Change    Warm-up and Cool-down Performed on first and last piece of equipment    Resistance Training Performed Yes    VAD Patient? No    PAD/SET Patient? No      Pain Assessment   Currently in Pain? No/denies    Multiple Pain Sites No             Capillary Blood Glucose: No results found for this or any previous visit (from the past 24 hour(s)).    Social History   Tobacco Use  Smoking Status Former   Current packs/day: 0.75   Average packs/day: 0.8 packs/day for 20.0 years (15.0 ttl pk-yrs)   Types: Cigarettes  Smokeless Tobacco Former   Quit date: 10/31/1998    Goals Met:  Independence with exercise equipment Exercise tolerated well No report of concerns or symptoms today Strength training completed today  Goals Unmet:  Not Applicable  Comments: Pt able to follow exercise prescription today without complaint.  Will continue to monitor for progression.    Dr. Erick Blinks is Medical Director for [facility] Pulmonary Rehab.

## 2022-10-09 ENCOUNTER — Encounter (HOSPITAL_COMMUNITY)
Admission: RE | Admit: 2022-10-09 | Discharge: 2022-10-09 | Disposition: A | Discharge status: Home / Self Care | Payer: Medicare Other | Source: Ambulatory Visit | Attending: Internal Medicine | Admitting: Internal Medicine

## 2022-10-09 ENCOUNTER — Encounter (HOSPITAL_COMMUNITY): Payer: Self-pay | Admitting: *Deleted

## 2022-10-09 ENCOUNTER — Ambulatory Visit (HOSPITAL_COMMUNITY): Payer: Medicare Other

## 2022-10-09 DIAGNOSIS — J449 Chronic obstructive pulmonary disease, unspecified: Secondary | ICD-10-CM | POA: Diagnosis not present

## 2022-10-09 NOTE — Progress Notes (Signed)
Daily Session Note  Patient Details  Name: Paula [name] MRN: [id number] Date of Birth: 16-Feb-1951 Referring Provider:   Flowsheet Row PULMONARY REHAB OTHER RESP ORIENTATION from 09/24/2022 in [facility]  Referring Provider Warnell Forester MD       Encounter Date: 10/09/2022  Check In:  Session Check In - 10/09/22 1056       Check-In   Supervising physician immediately available to respond to emergencies [facility] MD immediately available    Physician(s) Dr. Jenene Slicker    Location [facility]    Staff Present Rodena Medin, RN, BSN;Jessica Hawkins, MA, RCEP, CCRP, CCET    Virtual Visit No    Medication changes reported     No    Fall or balance concerns reported    No    Warm-up and Cool-down Performed as group-led instruction    Resistance Training Performed Yes    VAD Patient? No    PAD/SET Patient? No      Pain Assessment   Currently in Pain? No/denies    Multiple Pain Sites No             Capillary Blood Glucose: No results found for this or any previous visit (from the past 24 hour(s)).    Social History   Tobacco Use  Smoking Status Former   Current packs/day: 0.75   Average packs/day: 0.8 packs/day for 20.0 years (15.0 ttl pk-yrs)   Types: Cigarettes  Smokeless Tobacco Former   Quit date: 10/31/1998    Goals Met:  Proper associated with RPD/PD & O2 Sat Independence with exercise equipment Using PLB without cueing & demonstrates good technique Exercise tolerated well No report of concerns or symptoms today Strength training completed today  Goals Unmet:  Not Applicable  Comments: Check out 1200.   Dr. Erick Blinks is Medical Director for [facility] Pulmonary Rehab.

## 2022-10-09 NOTE — Progress Notes (Signed)
Pulmonary Individual Treatment Plan  Patient Details  Name: Paula [name] MRN: [id number] Date of Birth: October 05, 1950 Referring Provider:   Flowsheet Row PULMONARY REHAB OTHER RESP ORIENTATION from 09/24/2022 in [facility] CARDIAC REHABILITATION  Referring Provider Warnell Forester MD       Initial Encounter Date:  Flowsheet Row PULMONARY REHAB OTHER RESP ORIENTATION from 09/24/2022 in [facility] [place] CARDIAC REHABILITATION  Date 09/24/22       Visit Diagnosis: Chronic obstructive pulmonary disease, unspecified COPD type (HCC)  Patient's Home Medications on Admission:   Current Outpatient Medications:    acetaminophen (TYLENOL) 500 MG tablet, Take 500-1,000 mg by mouth every 6 (six) hours as needed (pain.)., Disp: , Rfl:    albuterol (VENTOLIN HFA) 108 (90 Base) MCG/ACT inhaler, Inhale 2 puffs into the lungs every 6 (six) hours as needed for wheezing or shortness of breath., Disp: , Rfl:    azelastine (ASTELIN) 0.1 % nasal spray, Place 2 sprays into both nostrils 2 (two) times daily as needed for allergies. Use in each nostril as directed, Disp: , Rfl:    budesonide-formoterol (SYMBICORT) 160-4.5 MCG/ACT inhaler, Inhale 2 puffs into the lungs 2 (two) times daily., Disp: , Rfl:    Cholecalciferol (VITAMIN D) 125 MCG (5000 UT) CAPS, Take 5,000 Units by mouth in the morning., Disp: , Rfl:    clotrimazole-betamethasone (LOTRISONE) cream, Apply 1 application topically 2 (two) times daily as needed (skin irritation.). , Disp: , Rfl:    diclofenac (VOLTAREN) 75 MG EC tablet, Take 75 mg by mouth daily as needed (arthritis pain)., Disp: , Rfl:    ezetimibe (ZETIA) 10 MG tablet, Take 10 mg by mouth in the morning., Disp: , Rfl:    famotidine (PEPCID) 40 MG tablet, Take 40 mg by mouth at bedtime., Disp: , Rfl:    fluticasone (FLONASE) 50 MCG/ACT nasal spray, Place 2 sprays into both nostrils daily as needed for allergies., Disp: , Rfl:    guaiFENesin (MUCINEX) 600 MG 12 hr tablet, Take 600 mg by mouth  daily as needed (congestion/COPD issues)., Disp: , Rfl:    hydrochlorothiazide (HYDRODIURIL) 25 MG tablet, Take 25 mg by mouth in the morning., Disp: , Rfl:    Ketotifen Fumarate (ALLERGY EYE DROPS OP), Place 1 drop into both eyes 2 (two) times daily as needed (allergy eyes)., Disp: , Rfl:    loratadine (CLARITIN) 10 MG tablet, Take 10 mg by mouth daily as needed for allergies., Disp: , Rfl:    losartan (COZAAR) 100 MG tablet, Take 100 mg by mouth in the morning., Disp: , Rfl:    montelukast (SINGULAIR) 10 MG tablet, Take 10 mg by mouth at bedtime., Disp: , Rfl:    Multiple Vitamins-Minerals (PRESERVISION AREDS 2 PO), Take 1 tablet by mouth in the morning and at bedtime., Disp: , Rfl:    NON FORMULARY, CPAP, O2 at night, Disp: , Rfl:    omeprazole (PRILOSEC) 40 MG capsule, Take 40 mg by mouth daily before breakfast., Disp: , Rfl:    sertraline (ZOLOFT) 100 MG tablet, Take 100 mg by mouth at bedtime., Disp: , Rfl:    terbinafine (LAMISIL) 250 MG tablet, Take 250 mg by mouth in the morning., Disp: , Rfl:   Past Medical History: Past Medical History:  Diagnosis Date   Anxiety    Arthritis    COPD (chronic obstructive pulmonary disease) (HCC)    Heart murmur    Hypertension    Lung nodule    LLL   Sleep apnea  CPAP with O2 2 liters    Tubal ectopic pregnancy 1970   Vitelliform macular dystrophy     Tobacco Use: Social History   Tobacco Use  Smoking Status Former   Current packs/day: 0.75   Average packs/day: 0.8 packs/day for 20.0 years (15.0 ttl pk-yrs)   Types: Cigarettes  Smokeless Tobacco Former   Quit date: 10/31/1998    Labs: Review Flowsheet        No data to display          Capillary Blood Glucose: No results found for: "GLUCAP"   Pulmonary Assessment Scores:  Pulmonary Assessment Scores     Row Name 09/24/22 1454         ADL UCSD   ADL Phase Entry     SOB Score total 64     Rest 1     Walk 3     Stairs 4     Bath 3     Dress 1     Shop 4        CAT Score   CAT Score 18       mMRC Score   mMRC Score 3             UCSD: Self-administered rating of dyspnea associated with activities of daily living (ADLs) 6-point scale (0 = "not at all" to 5 = "maximal or unable to do because of breathlessness")  Scoring Scores range from 0 to 120.  Minimally important difference is 5 units  CAT: CAT can identify the health impairment of COPD patients and is better correlated with disease progression.  CAT has a scoring range of zero to 40. The CAT score is classified into four groups of low (less than 10), medium (10 - 20), high (21-30) and very high (31-40) based on the impact level of disease on health status. A CAT score over 10 suggests significant symptoms.  A worsening CAT score could be explained by an exacerbation, poor medication adherence, poor inhaler technique, or progression of COPD or comorbid conditions.  CAT MCID is 2 points  mMRC: mMRC (Modified Medical Research Council) Dyspnea Scale is used to assess the degree of baseline functional disability in patients of respiratory disease due to dyspnea. No minimal important difference is established. A decrease in score of 1 point or greater is considered a positive change.   Pulmonary Function Assessment:  Pulmonary Function Assessment - 09/24/22 1454       Breath   Shortness of Breath Yes;Fear of Shortness of Breath;Limiting activity             Exercise Target Goals: Exercise Program Goal: Individual exercise prescription set using results from initial 6 min walk test and THRR while considering  patient's activity barriers and safety.   Exercise Prescription Goal: Initial exercise prescription builds to 30-45 minutes a day of aerobic activity, 2-3 days per week.  Home exercise guidelines will be given to patient during program as part of exercise prescription that the participant will acknowledge.  Activity Barriers & Risk Stratification:  Activity Barriers &  Cardiac Risk Stratification - 09/24/22 1250       Activity Barriers & Cardiac Risk Stratification   Activity Barriers Arthritis;Deconditioning;Muscular Weakness;Shortness of Breath;Joint Problems;Balance Concerns   currently in PT for foot            6 Minute Walk:  6 Minute Walk     Row Name 09/24/22 1428         6 Minute Walk  Phase Initial     Distance 675 feet     Walk Time 4.4 minutes     # of Rest Breaks 2  46 sec, 38 sec     MPH 1.74     METS 1.01     RPE 13     Perceived Dyspnea  16     VO2 Peak 3.53     Symptoms Yes (comment)     Comments hip pain 7/10     Resting HR 78 bpm     Resting BP 128/70     Resting Oxygen Saturation  96 %     Exercise Oxygen Saturation  during 6 min walk 88 %     Max Ex. HR 116 bpm     Max Ex. BP 204/112     2 Minute Post BP 144/72       Interval HR   1 Minute HR 108     2 Minute HR 100  seated     3 Minute HR 101     4 Minute HR 116     5 Minute HR 107     6 Minute HR 109     2 Minute Post HR 71     Interval Heart Rate? Yes       Interval Oxygen   Interval Oxygen? Yes     Baseline Oxygen Saturation % 96 %     1 Minute Oxygen Saturation % 90 %     1 Minute Liters of Oxygen 0 L  Room Air     2 Minute Oxygen Saturation % 90 %  seated     2 Minute Liters of Oxygen 0 L     3 Minute Oxygen Saturation % 88 %     3 Minute Liters of Oxygen 0 L     4 Minute Oxygen Saturation % 89 %     4 Minute Liters of Oxygen 0 L     5 Minute Oxygen Saturation % 88 %     5 Minute Liters of Oxygen 0 L     6 Minute Oxygen Saturation % 89 %     6 Minute Liters of Oxygen 0 L     2 Minute Post Oxygen Saturation % 95 %     2 Minute Post Liters of Oxygen 0 L              Oxygen Initial Assessment:  Oxygen Initial Assessment - 09/24/22 1249       Home Oxygen   Home Oxygen Device Home Concentrator    Sleep Oxygen Prescription Continuous;CPAP    Liters per minute 2    Home Exercise Oxygen Prescription None    Home Resting Oxygen  Prescription None    Compliance with Home Oxygen Use Yes      Initial 6 min Walk   Oxygen Used None      Program Oxygen Prescription   Program Oxygen Prescription None      Intervention   Short Term Goals To learn and exhibit compliance with exercise, home and travel O2 prescription;To learn and understand importance of monitoring SPO2 with pulse oximeter and demonstrate accurate use of the pulse oximeter.;To learn and understand importance of maintaining oxygen saturations>88%;To learn and demonstrate proper pursed lip breathing techniques or other breathing techniques. ;To learn and demonstrate proper use of respiratory medications    Long  Term Goals Exhibits compliance with exercise, home  and travel O2 prescription;Verbalizes importance of monitoring SPO2 with pulse  oximeter and return demonstration;Maintenance of O2 saturations>88%;Exhibits proper breathing techniques, such as pursed lip breathing or other method taught during program session;Compliance with respiratory medication;Demonstrates proper use of MDI's             Oxygen Re-Evaluation:  Oxygen Re-Evaluation     Row Name 09/24/22 1446 10/02/22 1117 10/07/22 1341         Program Oxygen Prescription   Program Oxygen Prescription None None None       Home Oxygen   Home Oxygen Device Home Concentrator None None     Sleep Oxygen Prescription Continuous;CPAP Continuous;CPAP Continuous;CPAP     Liters per minute 2 -- 2     Home Exercise Oxygen Prescription None None None     Home Resting Oxygen Prescription None None None     Compliance with Home Oxygen Use Yes Yes Yes       Goals/Expected Outcomes   Short Term Goals To learn and demonstrate proper pursed lip breathing techniques or other breathing techniques. ;To learn and exhibit compliance with exercise, home and travel O2 prescription;To learn and understand importance of monitoring SPO2 with pulse oximeter and demonstrate accurate use of the pulse oximeter.;To  learn and understand importance of maintaining oxygen saturations>88%;To learn and demonstrate proper use of respiratory medications To learn and demonstrate proper pursed lip breathing techniques or other breathing techniques. ;To learn and exhibit compliance with exercise, home and travel O2 prescription;To learn and understand importance of monitoring SPO2 with pulse oximeter and demonstrate accurate use of the pulse oximeter.;To learn and understand importance of maintaining oxygen saturations>88%;To learn and demonstrate proper use of respiratory medications To learn and demonstrate proper pursed lip breathing techniques or other breathing techniques. ;To learn and exhibit compliance with exercise, home and travel O2 prescription;To learn and understand importance of monitoring SPO2 with pulse oximeter and demonstrate accurate use of the pulse oximeter.;To learn and understand importance of maintaining oxygen saturations>88%;To learn and demonstrate proper use of respiratory medications     Long  Term Goals Exhibits proper breathing techniques, such as pursed lip breathing or other method taught during program session;Exhibits compliance with exercise, home  and travel O2 prescription;Verbalizes importance of monitoring SPO2 with pulse oximeter and return demonstration;Maintenance of O2 saturations>88%;Compliance with respiratory medication;Demonstrates proper use of MDI's Exhibits proper breathing techniques, such as pursed lip breathing or other method taught during program session;Exhibits compliance with exercise, home  and travel O2 prescription;Verbalizes importance of monitoring SPO2 with pulse oximeter and return demonstration;Maintenance of O2 saturations>88%;Compliance with respiratory medication;Demonstrates proper use of MDI's Exhibits proper breathing techniques, such as pursed lip breathing or other method taught during program session;Exhibits compliance with exercise, home  and travel O2  prescription;Verbalizes importance of monitoring SPO2 with pulse oximeter and return demonstration;Maintenance of O2 saturations>88%;Compliance with respiratory medication;Demonstrates proper use of MDI's     Comments Reviewed PLB technique with pt.  Talked about how it works and it's importance in maintaining their exercise saturations. Talked to patient about triggers today.  She is also good about using her PLB to help with breath control.  She is compliant with her inhalers.  She will use her abuterol inhaler on bad breathing days.  She does not have a spacer, so we talked about trying to get her one to use at home. Talked to patient about triggers today.  She is also good about using her PLB to help with breath control.  She is compliant with her inhalers.  She will use her abuterol inhaler on  bad breathing days.  She does not have a spacer, so we talked about trying to get her one to use at home.     Goals/Expected Outcomes Short: Become more profiecient at using PLB.   Long: Become independent at using PLB. Short: Get spacer to use with inhalers  Long; Continuet use PLB and stay compliant with inhaler Short: Get spacer to use with inhalers  Long; Continuet use PLB and stay compliant with inhaler              Oxygen Discharge (Final Oxygen Re-Evaluation):  Oxygen Re-Evaluation - 10/07/22 1341       Program Oxygen Prescription   Program Oxygen Prescription None      Home Oxygen   Home Oxygen Device None    Sleep Oxygen Prescription Continuous;CPAP    Liters per minute 2    Home Exercise Oxygen Prescription None    Home Resting Oxygen Prescription None    Compliance with Home Oxygen Use Yes      Goals/Expected Outcomes   Short Term Goals To learn and demonstrate proper pursed lip breathing techniques or other breathing techniques. ;To learn and exhibit compliance with exercise, home and travel O2 prescription;To learn and understand importance of monitoring SPO2 with pulse oximeter and  demonstrate accurate use of the pulse oximeter.;To learn and understand importance of maintaining oxygen saturations>88%;To learn and demonstrate proper use of respiratory medications    Long  Term Goals Exhibits proper breathing techniques, such as pursed lip breathing or other method taught during program session;Exhibits compliance with exercise, home  and travel O2 prescription;Verbalizes importance of monitoring SPO2 with pulse oximeter and return demonstration;Maintenance of O2 saturations>88%;Compliance with respiratory medication;Demonstrates proper use of MDI's    Comments Talked to patient about triggers today.  She is also good about using her PLB to help with breath control.  She is compliant with her inhalers.  She will use her abuterol inhaler on bad breathing days.  She does not have a spacer, so we talked about trying to get her one to use at home.    Goals/Expected Outcomes Short: Get spacer to use with inhalers  Long; Continuet use PLB and stay compliant with inhaler             Initial Exercise Prescription:  Initial Exercise Prescription - 09/24/22 1400       Date of Initial Exercise RX and Referring Provider   Date 09/24/22    Referring Provider Warnell Forester MD      Oxygen   Maintain Oxygen Saturation 88% or higher      Treadmill   MPH 1.5    Grade 0.5    Minutes 17    METs 2.25      NuStep   Level 1    SPM 80    Minutes 17    METs 1.5      REL-XR   Level 1    Watts 20    Speed 50    Minutes 15    METs 1.5      Prescription Details   Frequency (times per week) 3    Duration Progress to 30 minutes of continuous aerobic without signs/symptoms of physical distress      Intensity   THRR 40-80% of Max Heartrate 106-133    Ratings of Perceived Exertion 11-13    Perceived Dyspnea 0-4      Progression   Progression Continue to progress workloads to maintain intensity without signs/symptoms of physical distress.  Resistance Training    Training Prescription Yes    Weight 3 lb    Reps 10-15             Perform Capillary Blood Glucose checks as needed.  Exercise Prescription Changes:   Exercise Prescription Changes     Row Name 09/24/22 1400 10/07/22 1300           Response to Exercise   Blood Pressure (Admit) 128/70 140/62      Blood Pressure (Exercise) 204/112 158/82      Blood Pressure (Exit) 134/74 162/84      Heart Rate (Admit) 78 bpm 87 bpm      Heart Rate (Exercise) 116 bpm 87 bpm      Heart Rate (Exit) 87 bpm 71 bpm      Oxygen Saturation (Admit) 96 % 92 %      Oxygen Saturation (Exercise) 88 % 93 %      Oxygen Saturation (Exit) 91 % 96 %      Rating of Perceived Exertion (Exercise) 13 13      Perceived Dyspnea (Exercise) 16 1      Symptoms hip pain 7/10 --      Comments walk test orientation --      Duration -- Continue with 30 min of aerobic exercise without signs/symptoms of physical distress.      Intensity -- THRR unchanged        Progression   Progression -- Continue to progress workloads to maintain intensity without signs/symptoms of physical distress.        Resistance Training   Training Prescription -- Yes      Weight -- 3      Reps -- 10-15        NuStep   Level -- 1      SPM -- 110      Minutes -- 15      METs -- 2        REL-XR   Level -- 1      Speed -- 48      Minutes -- 15      METs -- 1.5        Oxygen   Maintain Oxygen Saturation -- 88% or higher               Exercise Comments:   Exercise Goals and Review:   Exercise Goals     Row Name 09/24/22 1445 10/07/22 1334           Exercise Goals   Increase Physical Activity Yes Yes      Intervention Provide advice, education, support and counseling about physical activity/exercise needs.;Develop an individualized exercise prescription for aerobic and resistive training based on initial evaluation findings, risk stratification, comorbidities and participant's personal goals. Provide advice, education,  support and counseling about physical activity/exercise needs.;Develop an individualized exercise prescription for aerobic and resistive training based on initial evaluation findings, risk stratification, comorbidities and participant's personal goals.      Expected Outcomes Short Term: Attend rehab on a regular basis to increase amount of physical activity.;Long Term: Add in home exercise to make exercise part of routine and to increase amount of physical activity.;Long Term: Exercising regularly at least 3-5 days a week. Short Term: Attend rehab on a regular basis to increase amount of physical activity.;Long Term: Add in home exercise to make exercise part of routine and to increase amount of physical activity.;Long Term: Exercising regularly at least 3-5 days a week.  Increase Strength and Stamina Yes Yes      Intervention Provide advice, education, support and counseling about physical activity/exercise needs.;Develop an individualized exercise prescription for aerobic and resistive training based on initial evaluation findings, risk stratification, comorbidities and participant's personal goals. Provide advice, education, support and counseling about physical activity/exercise needs.;Develop an individualized exercise prescription for aerobic and resistive training based on initial evaluation findings, risk stratification, comorbidities and participant's personal goals.      Expected Outcomes Short Term: Increase workloads from initial exercise prescription for resistance, speed, and METs.;Short Term: Perform resistance training exercises routinely during rehab and add in resistance training at home;Long Term: Improve cardiorespiratory fitness, muscular endurance and strength as measured by increased METs and functional capacity ( ) Short Term: Increase workloads from initial exercise prescription for resistance, speed, and METs.;Short Term: Perform resistance training exercises routinely during  rehab and add in resistance training at home;Long Term: Improve cardiorespiratory fitness, muscular endurance and strength as measured by increased METs and functional capacity ( )      Able to understand and use rate of perceived exertion (RPE) scale Yes Yes      Intervention Provide education and explanation on how to use RPE scale Provide education and explanation on how to use RPE scale      Expected Outcomes Short Term: Able to use RPE daily in rehab to express subjective intensity level;Long Term:  Able to use RPE to guide intensity level when exercising independently Short Term: Able to use RPE daily in rehab to express subjective intensity level;Long Term:  Able to use RPE to guide intensity level when exercising independently      Able to understand and use Dyspnea scale Yes Yes      Intervention Provide education and explanation on how to use Dyspnea scale Provide education and explanation on how to use Dyspnea scale      Expected Outcomes Short Term: Able to use Dyspnea scale daily in rehab to express subjective sense of shortness of breath during exertion;Long Term: Able to use Dyspnea scale to guide intensity level when exercising independently Short Term: Able to use Dyspnea scale daily in rehab to express subjective sense of shortness of breath during exertion;Long Term: Able to use Dyspnea scale to guide intensity level when exercising independently      Knowledge and understanding of Target Heart Rate Range (THRR) Yes Yes      Intervention Provide education and explanation of THRR including how the numbers were predicted and where they are located for reference Provide education and explanation of THRR including how the numbers were predicted and where they are located for reference      Expected Outcomes Short Term: Able to state/look up THRR;Short Term: Able to use daily as guideline for intensity in rehab;Long Term: Able to use THRR to govern intensity when exercising independently  Short Term: Able to state/look up THRR;Short Term: Able to use daily as guideline for intensity in rehab;Long Term: Able to use THRR to govern intensity when exercising independently      Able to check pulse independently Yes Yes      Intervention Provide education and demonstration on how to check pulse in carotid and radial arteries.;Review the importance of being able to check your own pulse for safety during independent exercise Provide education and demonstration on how to check pulse in carotid and radial arteries.;Review the importance of being able to check your own pulse for safety during independent exercise      Expected Outcomes Short  Term: Able to explain why pulse checking is important during independent exercise;Long Term: Able to check pulse independently and accurately Short Term: Able to explain why pulse checking is important during independent exercise;Long Term: Able to check pulse independently and accurately      Understanding of Exercise Prescription Yes Yes      Intervention Provide education, explanation, and written materials on patient's individual exercise prescription Provide education, explanation, and written materials on patient's individual exercise prescription      Expected Outcomes Short Term: Able to explain program exercise prescription;Long Term: Able to explain home exercise prescription to exercise independently Short Term: Able to explain program exercise prescription;Long Term: Able to explain home exercise prescription to exercise independently               Exercise Goals Re-Evaluation :  Exercise Goals Re-Evaluation     Row Name 09/24/22 1445 10/07/22 1334           Exercise Goal Re-Evaluation   Exercise Goals Review Able to understand and use rate of perceived exertion (RPE) scale;Able to understand and use Dyspnea scale Increase Physical Activity;Increase Strength and Stamina;Able to understand and use rate of perceived exertion (RPE) scale;Able  to understand and use Dyspnea scale;Knowledge and understanding of Target Heart Rate Range (THRR);Able to check pulse independently;Understanding of Exercise Prescription      Comments Reviewed RPE  and dyspnea scale and program prescription with pt today.  Pt voiced understanding and was given a copy of goals to take home. Pt has comepleted 4 sessions of pulmonary rehab. She is motivated to be back in the program after having foot surgery. She is progressing slowly. She is currently exercising a t2.0 METs on the stepper. Will continue to monitor and progress as able.      Expected Outcomes Short: Use RPE daily to regulate intensity.  Long: Follow program prescription Through exercise at rehab and home, patient will achieve their goals.               Discharge Exercise Prescription (Final Exercise Prescription Changes):  Exercise Prescription Changes - 10/07/22 1300       Response to Exercise   Blood Pressure (Admit) 140/62    Blood Pressure (Exercise) 158/82    Blood Pressure (Exit) 162/84    Heart Rate (Admit) 87 bpm    Heart Rate (Exercise) 87 bpm    Heart Rate (Exit) 71 bpm    Oxygen Saturation (Admit) 92 %    Oxygen Saturation (Exercise) 93 %    Oxygen Saturation (Exit) 96 %    Rating of Perceived Exertion (Exercise) 13    Perceived Dyspnea (Exercise) 1    Duration Continue with 30 min of aerobic exercise without signs/symptoms of physical distress.    Intensity THRR unchanged      Progression   Progression Continue to progress workloads to maintain intensity without signs/symptoms of physical distress.      Resistance Training   Training Prescription Yes    Weight 3    Reps 10-15      NuStep   Level 1    SPM 110    Minutes 15    METs 2      REL-XR   Level 1    Speed 48    Minutes 15    METs 1.5      Oxygen   Maintain Oxygen Saturation 88% or higher             Nutrition:  Target Goals: Understanding  of nutrition guidelines, daily intake of sodium  1500mg , cholesterol 200mg , calories 30% from fat and 7% or less from saturated fats, daily to have 5 or more servings of fruits and vegetables.  Biometrics:    Nutrition Therapy Plan and Nutrition Goals:  Nutrition Therapy & Goals - 09/30/22 1233       Nutrition Therapy   RD appointment deferred Yes      Personal Nutrition Goals   Comments We offer  educational sessions on heart healthy nutrition with handouts.      Intervention Plan   Intervention Nutrition handout(s) given to patient.    Expected Outcomes Short Term Goal: Understand basic principles of dietary content, such as calories, fat, sodium, cholesterol and nutrients.             Nutrition Assessments:  Nutrition Assessments - 09/24/22 1447       MEDFICTS Scores   Pre Score 50            MEDIFICTS Score Key: ?70 Need to make dietary changes  40-70 Heart Healthy Diet ? 40 Therapeutic Level Cholesterol Diet   Picture Your Plate Scores: <19 Unhealthy dietary pattern with much room for improvement. 41-50 Dietary pattern unlikely to meet recommendations for good health and room for improvement. 51-60 More healthful dietary pattern, with some room for improvement.  >60 Healthy dietary pattern, although there may be some specific behaviors that could be improved.    Nutrition Goals Re-Evaluation:   Nutrition Goals Discharge (Final Nutrition Goals Re-Evaluation):   Psychosocial: Target Goals: Acknowledge presence or absence of significant depression and/or stress, maximize coping skills, provide positive support system. Participant is able to verbalize types and ability to use techniques and skills needed for reducing stress and depression.  Initial Review & Psychosocial Screening:  Initial Psych Review & Screening - 09/24/22 1251       Initial Review   Current issues with Current Psychotropic Meds;Current Depression;Current Sleep Concerns;History of Depression      Family Dynamics   Good  Support System? Yes   husband (doesn't help with diet), kids     Barriers   Psychosocial barriers to participate in program The patient should benefit from training in stress management and relaxation.;Psychosocial barriers identified (see note)      Screening Interventions   Interventions Encouraged to exercise;Provide feedback about the scores to participant;To provide support and resources with identified psychosocial needs    Expected Outcomes Short Term goal: Identification and review with participant of any Quality of Life or Depression concerns found by scoring the questionnaire.;Long Term Goal: Stressors or current issues are controlled or eliminated.;Long Term goal: The participant improves quality of Life and PHQ9 Scores as seen by post scores and/or verbalization of changes;Short Term goal: Utilizing psychosocial counselor, staff and physician to assist with identification of specific Stressors or current issues interfering with healing process. Setting desired goal for each stressor or current issue identified.             Quality of Life Scores:  Quality of Life - 09/24/22 1503       Quality of Life   Select Quality of Life      Quality of Life Scores   Health/Function Pre 13.41 %    Socioeconomic Pre 21.08 %    Psych/Spiritual Pre 17.43 %    Family Pre 24 %    GLOBAL Pre 16.94 %            Scores of 19 and below usually indicate a  poorer quality of life in these areas.  A difference of  2-3 points is a clinically meaningful difference.  A difference of 2-3 points in the total score of the Quality of Life Index has been associated with significant improvement in overall quality of life, self-image, physical symptoms, and general health in studies assessing change in quality of life.   PHQ-9: Review Flowsheet       09/24/2022 04/30/2022 12/06/2021  Depression screen PHQ 2/9  Decreased Interest 1 1 2   Down, Depressed, Hopeless 1 1 0  PHQ - 2 Score 2 2 2   Altered  sleeping 0 1 1  Tired, decreased energy 2 2 2   Change in appetite 2 2 2   Feeling bad or failure about yourself  0 1 0  Trouble concentrating 0 0 0  Moving slowly or fidgety/restless 1 0 0  Suicidal thoughts 0 0 0  PHQ-9 Score 7 8 7   Difficult doing work/chores Very difficult Not difficult at all Somewhat difficult    Details           Interpretation of Total Score  Total Score Depression Severity:  1-4 = Minimal depression, 5-9 = Mild depression, 10-14 = Moderate depression, 15-19 = Moderately severe depression, 20-27 = Severe depression   Psychosocial Evaluation and Intervention:  Psychosocial Evaluation - 09/24/22 1430       Psychosocial Evaluation & Interventions   Interventions Stress management education;Encouraged to exercise with the program and follow exercise prescription    Comments Paula [name] is coming into pulmonary rehab for COPD.  She was not able to finish program last time due to a foot surgery but knew it made a big difference for her.  She is eager to return and get moving again.  She does not have any barriers to getting to rehab other than multiple doctor's appointments which we discussed making up those up at end.  She lives with her husband who is the primary cook, which causes her stress.  Her husband and cousins are her support system.  She is a retired Archivist.  Paula [name] has a history of depression and is currently on meds but feels well managed at this time.  Her PHQ indicated mil depression (6) but she feels it comes from not being able to do everything she wants to currently.  She enjoys working in her garden, but not able to do as much curerntly and would like to be to get back in the garden.  She really wants to be able to breathe better and increase her stamina in rehab.  She would really like to work on weight loss as part of being able to move around better.  Her biggest goal is to be able to dance at her niece's wedding in October!  She wants to enjoy and have  fun.    Expected Outcomes Short: Attend rehab to start to build stamina and improve breathing Long: Continue to find coping skills to help manage depression and continue to build stamina    Continue Psychosocial Services  Follow up required by staff             Psychosocial Re-Evaluation:  Psychosocial Re-Evaluation     Row Name 09/30/22 1234             Psychosocial Re-Evaluation   Current issues with Current Depression;Current Sleep Concerns;Current Psychotropic Meds       Comments Patient is new to the program. She has completed 2 sessions. She continues to have no psychosocial barriers  identified. Her depressions continues to be managed with Sertraline. She has participated in the program before and enjoys the sessions and demonstrates an interest in improving her health. Her personal goals for the program breathe better; increase her stamina; lose weight; and be able to dance at her neice's wedding on October. We will continue to monitor her progress.       Expected Outcomes Pt's depression and sleep will continue to be managed, and she will have no other identifiable psychosocial issues.       Interventions Stress management education;Relaxation education       Continue Psychosocial Services  No Follow up required                Psychosocial Discharge (Final Psychosocial Re-Evaluation):  Psychosocial Re-Evaluation - 09/30/22 1234       Psychosocial Re-Evaluation   Current issues with Current Depression;Current Sleep Concerns;Current Psychotropic Meds    Comments Patient is new to the program. She has completed 2 sessions. She continues to have no psychosocial barriers identified. Her depressions continues to be managed with Sertraline. She has participated in the program before and enjoys the sessions and demonstrates an interest in improving her health. Her personal goals for the program breathe better; increase her stamina; lose weight; and be able to dance at her  neice's wedding on October. We will continue to monitor her progress.    Expected Outcomes Pt's depression and sleep will continue to be managed, and she will have no other identifiable psychosocial issues.    Interventions Stress management education;Relaxation education    Continue Psychosocial Services  No Follow up required              Education: Education Goals: Education classes will be provided on a weekly basis, covering required topics. Participant will state understanding/return demonstration of topics presented.  Learning Barriers/Preferences:  Learning Barriers/Preferences - 09/24/22 1248       Learning Barriers/Preferences   Learning Barriers Sight   glasses   Learning Preferences Written Material;Skilled Demonstration             Education Topics: How Lungs Work and Diseases: - Discuss the anatomy of the lungs and diseases that can affect the lungs, such as COPD. Flowsheet Row PULMONARY REHAB OTHER RESPIRATORY from 04/25/2022 in [place] [facility]  Date 12/27/21  Educator DF  Instruction Review Code 1- Verbalizes Understanding       Exercise: -Discuss the importance of exercise, FITT principles of exercise, normal and abnormal responses to exercise, and how to exercise safely.   Environmental Irritants: -Discuss types of environmental irritants and how to limit exposure to environmental irritants. Flowsheet Row PULMONARY REHAB OTHER RESPIRATORY from 10/09/2022 in [place] [facility]  Date 10/02/22  Educator [facility]  Instruction Review Code 1- Verbalizes Understanding       Meds/Inhalers and oxygen: - Discuss respiratory medications, definition of an inhaler and oxygen, and the proper way to use an inhaler and oxygen. Flowsheet Row PULMONARY REHAB OTHER RESPIRATORY from 10/09/2022 in [place] [facility]  Date 10/09/22  Educator [facility]       Energy Saving Techniques: - Discuss methods to conserve energy and  decrease shortness of breath when performing activities of daily living.  Flowsheet Row PULMONARY REHAB OTHER RESPIRATORY from 04/25/2022 in [place] [facility]  Date 01/17/22  Educator Handout  Instruction Review Code 1- Verbalizes Understanding       Bronchial Hygiene / Breathing Techniques: - Discuss breathing mechanics, pursed-lip breathing technique,  proper posture, effective ways to clear airways, and other functional breathing techniques Flowsheet Row PULMONARY REHAB OTHER RESPIRATORY from 04/25/2022 in [place] [facility]  Date 01/24/22  Educator HB  Instruction Review Code 1- Personnel officer: - Provides group verbal and written instruction about the health risks of elevated stress, cause of high stress, and healthy ways to reduce stress.   Nutrition I: Fats: - Discuss the types of cholesterol, what cholesterol does to the body, and how cholesterol levels can be controlled. Flowsheet Row PULMONARY REHAB OTHER RESPIRATORY from 04/25/2022 in [place] [facility]  Date 02/07/22  Educator handout  Instruction Review Code 1- Verbalizes Understanding       Nutrition II: Labels: -Discuss the different components of food labels and how to read food labels. Flowsheet Row PULMONARY REHAB OTHER RESPIRATORY from 04/25/2022 in [place] [facility]  Date 02/21/22  Educator HB  Instruction Review Code 1- Verbalizes Understanding       Respiratory Infections: - Discuss the signs and symptoms of respiratory infections, ways to prevent respiratory infections, and the importance of seeking medical treatment when having a respiratory infection.   Stress I: Signs and Symptoms: - Discuss the causes of stress, how stress may lead to anxiety and depression, and ways to limit stress. Flowsheet Row PULMONARY REHAB OTHER RESPIRATORY from 04/25/2022 in [place] [facility]  Date 03/07/22   Educator DF  Instruction Review Code 2- Demonstrated Understanding       Stress II: Relaxation: -Discuss relaxation techniques to limit stress. Flowsheet Row PULMONARY REHAB OTHER RESPIRATORY from 04/25/2022 in [place] [facility]  Date 12/13/21  Educator DM  Instruction Review Code 1- Verbalizes Understanding       Oxygen for Home/Travel: - Discuss how to prepare for travel when on oxygen and proper ways to transport and store oxygen to ensure safety. Flowsheet Row PULMONARY REHAB OTHER RESPIRATORY from 04/25/2022 in [place] [facility]  Date 12/20/21  Educator DF  Instruction Review Code 1- Verbalizes Understanding       Knowledge Questionnaire Score:  Knowledge Questionnaire Score - 09/24/22 1452       Knowledge Questionnaire Score   Pre Score 16/18   missed O2 safety and saturations            Core Components/Risk Factors/Patient Goals at Admission:  Personal Goals and Risk Factors at Admission - 09/24/22 1255       Core Components/Risk Factors/Patient Goals on Admission    Weight Management Yes;Obesity;Weight Loss    Intervention Weight Management: Provide education and appropriate resources to help participant work on and attain dietary goals.;Weight Management/Obesity: Establish reasonable short term and long term weight goals.;Obesity: Provide education and appropriate resources to help participant work on and attain dietary goals.    Admit Weight 282 lb 6.4 oz (128.1 kg)    Goal Weight: Short Term 277 lb (125.6 kg)    Goal Weight: Long Term 272 lb (123.4 kg)    Expected Outcomes Short Term: Continue to assess and modify interventions until short term weight is achieved;Long Term: Adherence to nutrition and physical activity/exercise program aimed toward attainment of established weight goal;Weight Loss: Understanding of general recommendations for a balanced deficit meal plan, which promotes 1-2 lb weight loss per week and includes a  negative energy balance of [phone] kcal/d;Understanding recommendations for meals to include 15-35% energy as protein, 25-35% energy from fat, 35-60% energy from carbohydrates, less than 200mg  of dietary cholesterol, 20-35  gm of total fiber daily;Understanding of distribution of calorie intake throughout the day with the consumption of 4-5 meals/snacks    Improve shortness of breath with ADL's Yes    Intervention Provide education, individualized exercise plan and daily activity instruction to help decrease symptoms of SOB with activities of daily living.    Expected Outcomes Short Term: Improve cardiorespiratory fitness to achieve a reduction of symptoms when performing ADLs;Long Term: Be able to perform more ADLs without symptoms or delay the onset of symptoms    Increase knowledge of respiratory medications and ability to use respiratory devices properly  Yes    Intervention Provide education and demonstration as needed of appropriate use of medications, inhalers, and oxygen therapy.    Expected Outcomes Short Term: Achieves understanding of medications use. Understands that oxygen is a medication prescribed by physician. Demonstrates appropriate use of inhaler and oxygen therapy.;Long Term: Maintain appropriate use of medications, inhalers, and oxygen therapy.    Hypertension Yes    Intervention Provide education on lifestyle modifcations including regular physical activity/exercise, weight management, moderate sodium restriction and increased consumption of fresh fruit, vegetables, and low fat dairy, alcohol moderation, and smoking cessation.;Monitor prescription use compliance.    Expected Outcomes Short Term: Continued assessment and intervention until BP is < 140/2mm HG in hypertensive participants. < 130/12mm HG in hypertensive participants with diabetes, heart failure or chronic kidney disease.;Long Term: Maintenance of blood pressure at goal levels.             Core Components/Risk  Factors/Patient Goals Review:   Goals and Risk Factor Review     Row Name 09/30/22 1237             Core Components/Risk Factors/Patient Goals Review   Personal Goals Review Weight Management/Obesity;Increase knowledge of respiratory medications and ability to use respiratory devices properly.;Improve shortness of breath with ADL's;Other;Hypertension       Review Patient was referred to PR with COPD. She has completed 1 session. Her current weight is 282.4 lbs up 0.4 lbs since her initial visit. She exercises on RA with O2 saturations at 93%. Her blood pressure is hypertensive today. We will monitor her progress in the program.       Expected Outcomes Patient will complete the program meeting both personal and program goals.                Core Components/Risk Factors/Patient Goals at Discharge (Final Review):   Goals and Risk Factor Review - 09/30/22 1237       Core Components/Risk Factors/Patient Goals Review   Personal Goals Review Weight Management/Obesity;Increase knowledge of respiratory medications and ability to use respiratory devices properly.;Improve shortness of breath with ADL's;Other;Hypertension    Review Patient was referred to PR with COPD. She has completed 1 session. Her current weight is 282.4 lbs up 0.4 lbs since her initial visit. She exercises on RA with O2 saturations at 93%. Her blood pressure is hypertensive today. We will monitor her progress in the program.    Expected Outcomes Patient will complete the program meeting both personal and program goals.             ITP Comments:  ITP Comments     Row Name 09/24/22 1419 10/09/22 1249         ITP Comments Pt attended orientation today for Pulmonary Rehab. Completed and gym orientation. Documentation for diagnosis can be found in CE office visit 08/05/22.  Initial ITP created and sent for review to Dr. York Grice,  Medical Director. Pt service time 1225-1330. 30 day review completed. ITP sent to  Dr.Jehanzeb Memon, Medical Director of  Pulmonary Rehab. Continue with ITP unless changes are made by physician.               Comments: 30 day review

## 2022-10-11 ENCOUNTER — Telehealth (HOSPITAL_COMMUNITY): Payer: Self-pay | Admitting: *Deleted

## 2022-10-11 ENCOUNTER — Encounter (HOSPITAL_COMMUNITY): Payer: Medicare Other

## 2022-10-11 ENCOUNTER — Ambulatory Visit (HOSPITAL_COMMUNITY): Payer: Medicare Other

## 2022-10-11 NOTE — Telephone Encounter (Signed)
Unable to hold rehab classes due to IT and monitor outage

## 2022-10-14 ENCOUNTER — Ambulatory Visit (HOSPITAL_COMMUNITY): Payer: Medicare Other

## 2022-10-14 ENCOUNTER — Encounter (HOSPITAL_COMMUNITY)
Admission: RE | Admit: 2022-10-14 | Discharge: 2022-10-14 | Disposition: A | Discharge status: Home / Self Care | Payer: Medicare Other | Source: Ambulatory Visit | Attending: Internal Medicine | Admitting: Internal Medicine

## 2022-10-14 DIAGNOSIS — J449 Chronic obstructive pulmonary disease, unspecified: Secondary | ICD-10-CM

## 2022-10-14 NOTE — Progress Notes (Signed)
Reviewed home exercise with pt today.  Pt plans to walk with dog and use weights at home for exercise.  We also talked about trying out staff videos.  She is also going to PT once a week. Reviewed THR, pulse, RPE, sign and symptoms, pulse oximetery and when to call 911 or MD.  Also discussed weather considerations and indoor options.  Pt voiced understanding.

## 2022-10-14 NOTE — Progress Notes (Signed)
Daily Session Note  Patient Details  Name: Paula [name] MRN: [id number] Date of Birth: 23-Jul-1950 Referring Provider:   Flowsheet Row PULMONARY REHAB OTHER RESP ORIENTATION from 09/24/2022 in [facility]  Referring Provider Warnell Forester MD       Encounter Date: 10/14/2022  Check In:  Session Check In - 10/14/22 1059       Check-In   Supervising physician immediately available to respond to emergencies See telemetry face sheet for immediately available MD    Location [facility]    Staff Present Ross Ludwig, BS, Exercise Physiologist;Daphyne Daphine Deutscher, RN, BSN    Virtual Visit No    Medication changes reported     No    Fall or balance concerns reported    No    Tobacco Cessation No Change    Warm-up and Cool-down Performed on first and last piece of equipment    Resistance Training Performed Yes    VAD Patient? No    PAD/SET Patient? No      Pain Assessment   Currently in Pain? No/denies    Multiple Pain Sites No             Capillary Blood Glucose: No results found for this or any previous visit (from the past 24 hour(s)).    Social History   Tobacco Use  Smoking Status Former   Current packs/day: 0.75   Average packs/day: 0.8 packs/day for 20.0 years (15.0 ttl pk-yrs)   Types: Cigarettes  Smokeless Tobacco Former   Quit date: 10/31/1998    Goals Met:  Independence with exercise equipment Exercise tolerated well No report of concerns or symptoms today Strength training completed today  Goals Unmet:  Not Applicable  Comments: Pt able to follow exercise prescription today without complaint.  Will continue to monitor for progression.    Dr. Erick Blinks is Medical Director for [facility].

## 2022-10-16 ENCOUNTER — Ambulatory Visit (HOSPITAL_COMMUNITY): Payer: Medicare Other

## 2022-10-16 ENCOUNTER — Encounter (HOSPITAL_COMMUNITY)
Admission: RE | Admit: 2022-10-16 | Discharge: 2022-10-16 | Disposition: A | Discharge status: Home / Self Care | Payer: Medicare Other | Source: Ambulatory Visit | Attending: Internal Medicine | Admitting: Internal Medicine

## 2022-10-16 DIAGNOSIS — J449 Chronic obstructive pulmonary disease, unspecified: Secondary | ICD-10-CM | POA: Diagnosis not present

## 2022-10-16 NOTE — Progress Notes (Signed)
Daily Session Note  Patient Details  Name: Paula [name] MRN: [id number] Date of Birth: [birth year] Referring Provider:   Flowsheet Row PULMONARY REHAB OTHER RESP ORIENTATION from 09/24/2022 in [facility]  Referring Provider Warnell Forester MD       Encounter Date: 10/16/2022  Check In:  Session Check In - 10/16/22 1100       Check-In   Supervising physician immediately available to respond to emergencies See telemetry face sheet for immediately available MD    Physician(s) Dr. Wyline Mood    Location [facility]    Staff Present Ross Ludwig, BS, Exercise Physiologist;Hillary Troutman BSN, RN    Virtual Visit No    Medication changes reported     No    Fall or balance concerns reported    No    Tobacco Cessation No Change    Warm-up and Cool-down Performed on first and last piece of equipment    Resistance Training Performed Yes    VAD Patient? No    PAD/SET Patient? No      Pain Assessment   Currently in Pain? No/denies    Multiple Pain Sites No             Capillary Blood Glucose: No results found for this or any previous visit (from the past 24 hour(s)).    Social History   Tobacco Use  Smoking Status Former   Current packs/day: 0.75   Average packs/day: 0.8 packs/day for 20.0 years (15.0 ttl pk-yrs)   Types: Cigarettes  Smokeless Tobacco Former   Quit date: 10/31/1998    Goals Met:   Goals Unmet:  Not Applicable  Comments: Pt able to follow exercise prescription today without complaint.  Will continue to monitor for progression.    Dr. Erick Blinks is Medical Director for [facility] Pulmonary Re[facility].

## 2022-10-18 ENCOUNTER — Ambulatory Visit (HOSPITAL_COMMUNITY): Payer: Medicare Other

## 2022-10-18 ENCOUNTER — Encounter (HOSPITAL_COMMUNITY)
Admission: RE | Admit: 2022-10-18 | Discharge: 2022-10-18 | Disposition: A | Discharge status: Home / Self Care | Payer: Medicare Other | Source: Ambulatory Visit | Attending: Internal Medicine | Admitting: Internal Medicine

## 2022-10-18 DIAGNOSIS — J449 Chronic obstructive pulmonary disease, unspecified: Secondary | ICD-10-CM

## 2022-10-18 NOTE — Progress Notes (Signed)
Daily Session Note  Patient Details  Name: Paula [name] MRN: [id number] Date of Birth: 26-Jan-1951 Referring Provider:   Flowsheet Row PULMONARY REHAB OTHER RESP ORIENTATION from 09/24/2022 in [facility] CARDIAC REHABILITATION  Referring Provider Warnell Forester MD       Encounter Date: 10/18/2022  Check In:  Session Check In - 10/18/22 1102       Check-In   Supervising physician immediately available to respond to emergencies See telemetry face sheet for immediately available MD    Location [facility]    Staff Present Ross Ludwig, BS, Exercise Physiologist;Kyi Romanello [place], MA, RCEP, CCRP, Dow Adolph, RN, BSN    Virtual Visit No    Medication changes reported     No    Fall or balance concerns reported    No    Warm-up and Cool-down Performed on first and last piece of equipment    Resistance Training Performed Yes    VAD Patient? No    PAD/SET Patient? No      Pain Assessment   Currently in Pain? No/denies             Capillary Blood Glucose: No results found for this or any previous visit (from the past 24 hour(s)).    Social History   Tobacco Use  Smoking Status Former   Current packs/day: 0.75   Average packs/day: 0.8 packs/day for 20.0 years (15.0 ttl pk-yrs)   Types: Cigarettes  Smokeless Tobacco Former   Quit date: 10/31/1998    Goals Met:  Proper associated with RPD/PD & O2 Sat Independence with exercise equipment Exercise tolerated well No report of concerns or symptoms today Strength training completed today  Goals Unmet:  Not Applicable  Comments: Pt able to follow exercise prescription today without complaint.  Will continue to monitor for progression.    Dr. Erick Blinks is Medical Director for [facility] Pulmonary Re[facility].

## 2022-10-21 ENCOUNTER — Ambulatory Visit (HOSPITAL_COMMUNITY): Payer: Medicare Other

## 2022-10-21 ENCOUNTER — Encounter (HOSPITAL_COMMUNITY)
Admission: RE | Admit: 2022-10-21 | Discharge: 2022-10-21 | Disposition: A | Discharge status: Home / Self Care | Payer: Medicare Other | Source: Ambulatory Visit | Attending: Internal Medicine | Admitting: Internal Medicine

## 2022-10-21 DIAGNOSIS — J449 Chronic obstructive pulmonary disease, unspecified: Secondary | ICD-10-CM

## 2022-10-21 NOTE — Progress Notes (Signed)
Daily Session Note  Patient Details  Name: Paula [name] MRN: [id number] Date of Birth: 10/01/1950 Referring Provider:   Flowsheet Row PULMONARY REHAB OTHER RESP ORIENTATION from 09/24/2022 in [facility] CARDIAC REHABILITATION  Referring Provider Warnell Forester MD       Encounter Date: 10/21/2022  Check In:  Session Check In - 10/21/22 1100       Check-In   Supervising physician immediately available to respond to emergencies See telemetry face sheet for immediately available MD    Location [facility]    Staff Present Ross Ludwig, BS, Exercise Physiologist;Daphyne Daphine Deutscher, RN, BSN    Virtual Visit No    Medication changes reported     No    Fall or balance concerns reported    No    Tobacco Cessation No Change    Warm-up and Cool-down Performed on first and last piece of equipment   patient was late for warm up   Resistance Training Performed Yes    VAD Patient? No    PAD/SET Patient? No      Pain Assessment   Currently in Pain? Yes    Pain Score 3     Pain Location Neck    Pain Descriptors / Indicators Aching    Multiple Pain Sites No             Capillary Blood Glucose: No results found for this or any previous visit (from the past 24 hour(s)).    Social History   Tobacco Use  Smoking Status Former   Current packs/day: 0.75   Average packs/day: 0.8 packs/day for 20.0 years (15.0 ttl pk-yrs)   Types: Cigarettes  Smokeless Tobacco Former   Quit date: 10/31/1998    Goals Met:  Independence with exercise equipment Exercise tolerated well No report of concerns or symptoms today Strength training completed today  Goals Unmet:  Not Applicable  Comments: Pt able to follow exercise prescription today without complaint.  Will continue to monitor for progression.    Dr. Dina Rich is Medical Director for [facility] Cardiac Rehab

## 2022-10-23 ENCOUNTER — Encounter (HOSPITAL_COMMUNITY): Admission: RE | Admit: 2022-10-23 | Payer: Medicare Other | Source: Ambulatory Visit

## 2022-10-23 ENCOUNTER — Ambulatory Visit (HOSPITAL_COMMUNITY): Payer: Medicare Other

## 2022-10-23 DIAGNOSIS — J449 Chronic obstructive pulmonary disease, unspecified: Secondary | ICD-10-CM | POA: Diagnosis not present

## 2022-10-23 NOTE — Progress Notes (Signed)
Daily Session Note  Patient Details  Name: Paula [name] MRN: [id number] Date of Birth: 1950-12-28 Referring Provider:   Flowsheet Row PULMONARY REHAB OTHER RESP ORIENTATION from 09/24/2022 in [facility] CARDIAC REHABILITATION  Referring Provider Warnell Forester MD       Encounter Date: 10/23/2022  Check In:  Session Check In - 10/23/22 1134       Check-In   Supervising physician immediately available to respond to emergencies See telemetry face sheet for immediately available MD    Location [facility]    Staff Present Fabio Pierce, MA, RCEP, CCRP, CCET;Heather Gaynell Face, Exercise Physiologist;Hillary Leonidas Romberg BSN, RN    Virtual Visit No    Medication changes reported     No    Fall or balance concerns reported    No    Warm-up and Cool-down Performed on first and last piece of equipment    Resistance Training Performed Yes    VAD Patient? No    PAD/SET Patient? No      Pain Assessment   Currently in Pain? Yes    Pain Score 4     Pain Descriptors / Indicators Aching             Capillary Blood Glucose: No results found for this or any previous visit (from the past 24 hour(s)).    Social History   Tobacco Use  Smoking Status Former   Current packs/day: 0.75   Average packs/day: 0.8 packs/day for 20.0 years (15.0 ttl pk-yrs)   Types: Cigarettes  Smokeless Tobacco Former   Quit date: 10/31/1998    Goals Met:  Proper associated with RPD/PD & O2 Sat Independence with exercise equipment Exercise tolerated well No report of concerns or symptoms today Strength training completed today  Goals Unmet:  Not Applicable  Comments: Pt able to follow exercise prescription today without complaint.  Will continue to monitor for progression.    Dr. Erick Blinks is Medical Director for [facility] Pulmonary Rehab.

## 2022-10-25 ENCOUNTER — Encounter (HOSPITAL_COMMUNITY)
Admission: RE | Admit: 2022-10-25 | Discharge: 2022-10-25 | Disposition: A | Discharge status: Home / Self Care | Payer: Medicare Other | Source: Ambulatory Visit | Attending: Internal Medicine | Admitting: Internal Medicine

## 2022-10-25 ENCOUNTER — Ambulatory Visit (HOSPITAL_COMMUNITY): Payer: Medicare Other

## 2022-10-25 DIAGNOSIS — J449 Chronic obstructive pulmonary disease, unspecified: Secondary | ICD-10-CM | POA: Diagnosis present

## 2022-10-25 NOTE — Progress Notes (Deleted)
Daily Session Note  Patient Details  Name: Paula [name] MRN: [id number] Date of Birth: [birth year] Referring Provider:   Flowsheet Row PULMONARY REHAB OTHER RESP ORIENTATION from 09/24/2022 in [facility] CARDIAC REHABILITATION  Referring Provider Warnell Forester MD       Encounter Date: 10/25/2022  Check In:  Session Check In - 10/25/22 1100       Check-In   Supervising physician immediately available to respond to emergencies See telemetry face sheet for immediately available MD    Location [facility]    Staff Present Ross Ludwig, BS, Exercise Physiologist;Jessica [place], MA, RCEP, CCRP, Harolyn Rutherford, RN, BSN    Virtual Visit No    Medication changes reported     No    Fall or balance concerns reported    No    Tobacco Cessation No Change    Warm-up and Cool-down Performed on first and last piece of equipment    Resistance Training Performed Yes      Pain Assessment   Currently in Pain? No/denies    Pain Score 0-No pain             Capillary Blood Glucose: No results found for this or any previous visit (from the past 24 hour(s)).    Social History   Tobacco Use  Smoking Status Former   Current packs/day: 0.75   Average packs/day: 0.8 packs/day for 20.0 years (15.0 ttl pk-yrs)   Types: Cigarettes  Smokeless Tobacco Former   Quit date: 10/31/1998    Goals Met:  Independence with exercise equipment Exercise tolerated well No report of concerns or symptoms today Strength training completed today  Goals Unmet:  Not Applicable  Comments: Pt able to follow exercise prescription today without complaint.  Will continue to monitor for progression.    Dr. Dina Rich is Medical Director for [facility] Cardiac Rehab

## 2022-10-25 NOTE — Progress Notes (Signed)
Daily Session Note  Patient Details  Name: Paula [name] MRN: [id number] Date of Birth: Jun 21, 1950 Referring Provider:   Flowsheet Row PULMONARY REHAB OTHER RESP ORIENTATION from 09/24/2022 in [facility] CARDIAC REHABILITATION  Referring Provider Warnell Forester MD       Encounter Date: 10/25/2022  Check In:  Session Check In - 10/25/22 1100       Check-In   Supervising physician immediately available to respond to emergencies See telemetry face sheet for immediately available MD    Location [facility]    Staff Present Ross Ludwig, BS, Exercise Physiologist;Jessica [place], MA, RCEP, CCRP, Harolyn Rutherford, RN, BSN    Virtual Visit No    Medication changes reported     No    Fall or balance concerns reported    No    Tobacco Cessation No Change    Warm-up and Cool-down Performed on first and last piece of equipment    Resistance Training Performed Yes      Pain Assessment   Currently in Pain? No/denies    Pain Score 0-No pain             Capillary Blood Glucose: No results found for this or any previous visit (from the past 24 hour(s)).    Social History   Tobacco Use  Smoking Status Former   Current packs/day: 0.75   Average packs/day: 0.8 packs/day for 20.0 years (15.0 ttl pk-yrs)   Types: Cigarettes  Smokeless Tobacco Former   Quit date: 10/31/1998    Goals Met:  Proper associated with RPD/PD & O2 Sat Independence with exercise equipment Using PLB without cueing & demonstrates good technique Exercise tolerated well Queuing for purse lip breathing No report of concerns or symptoms today Strength training completed today  Goals Unmet:  Not Applicable  Comments: Pt able to follow exercise prescription today without complaint.  Will continue to monitor for progression.    Dr. Erick Blinks is Medical Director for [facility] Pulmonary Rehab.

## 2022-10-28 ENCOUNTER — Ambulatory Visit (HOSPITAL_COMMUNITY): Payer: Medicare Other

## 2022-10-28 ENCOUNTER — Encounter (HOSPITAL_COMMUNITY)
Admission: RE | Admit: 2022-10-28 | Discharge: 2022-10-28 | Disposition: A | Discharge status: Home / Self Care | Payer: Medicare Other | Source: Ambulatory Visit | Attending: Internal Medicine | Admitting: Internal Medicine

## 2022-10-28 DIAGNOSIS — J449 Chronic obstructive pulmonary disease, unspecified: Secondary | ICD-10-CM | POA: Diagnosis not present

## 2022-10-28 NOTE — Progress Notes (Signed)
Daily Session Note  Patient Details  Name: Paula [name] MRN: [id number] Date of Birth: [birth year] Referring Provider:   Flowsheet Row PULMONARY REHAB OTHER RESP ORIENTATION from 09/24/2022 in [facility] CARDIAC REHABILITATION  Referring Provider Warnell Forester MD       Encounter Date: 10/28/2022  Check In:  Session Check In - 10/28/22 1129       Check-In   Supervising physician immediately available to respond to emergencies See telemetry face sheet for immediately available ER MD    Location [facility]    Staff Present Erskine Speed, Margarite Gouge, RN, BSN    Virtual Visit No    Medication changes reported     No    Fall or balance concerns reported    No    Tobacco Cessation No Change    Warm-up and Cool-down Performed on first and last piece of equipment    Resistance Training Performed Yes    VAD Patient? No    PAD/SET Patient? No      Pain Assessment   Currently in Pain? No/denies             Capillary Blood Glucose: No results found for this or any previous visit (from the past 24 hour(s)).    Social History   Tobacco Use  Smoking Status Former   Current packs/day: 0.75   Average packs/day: 0.8 packs/day for 20.0 years (15.0 ttl pk-yrs)   Types: Cigarettes  Smokeless Tobacco Former   Quit date: 10/31/1998    Goals Met:  Independence with exercise equipment Exercise tolerated well No report of concerns or symptoms today  Goals Unmet:  Not Applicable  Comments: Pt able to follow exercise prescription today without complaint.  Will continue to monitor for progression.    Dr. Dina Rich is Medical Director for [facility] Cardiac Rehab

## 2022-10-30 ENCOUNTER — Ambulatory Visit (HOSPITAL_COMMUNITY): Payer: Medicare Other

## 2022-10-30 ENCOUNTER — Encounter (HOSPITAL_COMMUNITY)
Admission: RE | Admit: 2022-10-30 | Discharge: 2022-10-30 | Disposition: A | Discharge status: Home / Self Care | Payer: Medicare Other | Source: Ambulatory Visit | Attending: Internal Medicine | Admitting: Internal Medicine

## 2022-10-30 DIAGNOSIS — J449 Chronic obstructive pulmonary disease, unspecified: Secondary | ICD-10-CM

## 2022-10-30 NOTE — Progress Notes (Signed)
Daily Session Note  Patient Details  Name: Paula [name] MRN: [id number] Date of Birth: 01/23/1951 Referring Provider:   Flowsheet Row PULMONARY REHAB OTHER RESP ORIENTATION from 09/24/2022 in [facility] CARDIAC REHABILITATION  Referring Provider Warnell Forester MD       Encounter Date: 10/30/2022  Check In:  Session Check In - 10/30/22 1100       Check-In   Supervising physician immediately available to respond to emergencies See telemetry face sheet for immediately available ER MD    Location [facility]    Staff Present Rodena Medin, RN, Pleas Koch, RN, BSN    Virtual Visit No    Medication changes reported     No    Fall or balance concerns reported    No    Warm-up and Cool-down Performed on first and last piece of equipment    Resistance Training Performed Yes    VAD Patient? No    PAD/SET Patient? No      Pain Assessment   Currently in Pain? No/denies    Pain Score 0-No pain    Multiple Pain Sites No             Capillary Blood Glucose: No results found for this or any previous visit (from the past 24 hour(s)).    Social History   Tobacco Use  Smoking Status Former   Current packs/day: 0.75   Average packs/day: 0.8 packs/day for 20.0 years (15.0 ttl pk-yrs)   Types: Cigarettes  Smokeless Tobacco Former   Quit date: 10/31/1998    Goals Met:  Independence with exercise equipment Exercise tolerated well No report of concerns or symptoms today Strength training completed today  Goals Unmet:  Not Applicable  Comments: Check out 1200.   Dr. Dina Rich is Medical Director for [facility] Cardiac Rehab

## 2022-11-01 ENCOUNTER — Ambulatory Visit (HOSPITAL_COMMUNITY): Payer: Medicare Other

## 2022-11-01 ENCOUNTER — Encounter (HOSPITAL_COMMUNITY): Payer: Medicare Other

## 2022-11-04 ENCOUNTER — Encounter (HOSPITAL_COMMUNITY)
Admission: RE | Admit: 2022-11-04 | Discharge: 2022-11-04 | Disposition: A | Discharge status: Home / Self Care | Payer: Medicare Other | Source: Ambulatory Visit | Attending: Internal Medicine | Admitting: Internal Medicine

## 2022-11-04 ENCOUNTER — Ambulatory Visit (HOSPITAL_COMMUNITY): Payer: Medicare Other

## 2022-11-04 DIAGNOSIS — J449 Chronic obstructive pulmonary disease, unspecified: Secondary | ICD-10-CM

## 2022-11-04 NOTE — Progress Notes (Signed)
Daily Session Note  Patient Details  Name: Paula [name] MRN: [id number] Date of Birth: 12-31-1950 Referring Provider:   Flowsheet Row PULMONARY REHAB OTHER RESP ORIENTATION from 09/24/2022 in [facility] CARDIAC REHABILITATION  Referring Provider Warnell Forester MD       Encounter Date: 11/04/2022  Check In:  Session Check In - 11/04/22 1040       Check-In   Supervising physician immediately available to respond to emergencies See telemetry face sheet for immediately available MD    Location [facility]    Staff Present Ross Ludwig, BS, Exercise Physiologist;Debra Laural Benes, RN, Pleas Koch, RN, BSN    Virtual Visit No    Medication changes reported     No    Fall or balance concerns reported    No    Tobacco Cessation No Change    Warm-up and Cool-down Performed on first and last piece of equipment    Resistance Training Performed Yes      Pain Assessment   Currently in Pain? No/denies             Capillary Blood Glucose: No results found for this or any previous visit (from the past 24 hour(s)).    Social History   Tobacco Use  Smoking Status Former   Current packs/day: 0.75   Average packs/day: 0.8 packs/day for 20.0 years (15.0 ttl pk-yrs)   Types: Cigarettes  Smokeless Tobacco Former   Quit date: 10/31/1998    Goals Met:  Proper associated with RPD/PD & O2 Sat Independence with exercise equipment Using PLB without cueing & demonstrates good technique Exercise tolerated well Queuing for purse lip breathing No report of concerns or symptoms today Strength training completed today  Goals Unmet:  Not Applicable  Comments: Pt able to follow exercise prescription today without complaint.  Will continue to monitor for progression.    Dr. Erick Blinks is Medical Director for [facility] Pulmonary Rehab.

## 2022-11-06 ENCOUNTER — Encounter (HOSPITAL_COMMUNITY)
Admission: RE | Admit: 2022-11-06 | Discharge: 2022-11-06 | Disposition: A | Discharge status: Home / Self Care | Payer: Medicare Other | Source: Ambulatory Visit | Attending: Internal Medicine | Admitting: Internal Medicine

## 2022-11-06 ENCOUNTER — Encounter (HOSPITAL_COMMUNITY): Payer: Self-pay | Admitting: *Deleted

## 2022-11-06 ENCOUNTER — Ambulatory Visit (HOSPITAL_COMMUNITY): Payer: Medicare Other

## 2022-11-06 DIAGNOSIS — J449 Chronic obstructive pulmonary disease, unspecified: Secondary | ICD-10-CM | POA: Diagnosis not present

## 2022-11-06 NOTE — Progress Notes (Signed)
Daily Session Note  Patient Details  Name: Paula [name] MRN: [id number] Date of Birth: January 07, 1951 Referring Provider:   Flowsheet Row PULMONARY REHAB OTHER RESP ORIENTATION from 09/24/2022 in [facility] CARDIAC REHABILITATION  Referring Provider Warnell Forester MD       Encounter Date: 11/06/2022  Check In:  Session Check In - 11/06/22 1025       Check-In   Supervising physician immediately available to respond to emergencies See telemetry face sheet for immediately available ER MD    Location [facility]    Staff Present Rodena Medin, RN, BSN;Jessica Juanetta [place], MA, RCEP, CCRP, CCET;Hillary [employer], RN;Heather [place], [place], Exercise Physiologist    Virtual Visit No    Medication changes reported     No    Fall or balance concerns reported    No    Warm-up and Cool-down Performed on first and last piece of equipment    Resistance Training Performed Yes    VAD Patient? No    PAD/SET Patient? No      Pain Assessment   Currently in Pain? No/denies    Pain Score 0-No pain    Multiple Pain Sites No             Capillary Blood Glucose: No results found for this or any previous visit (from the past 24 hour(s)).    Social History   Tobacco Use  Smoking Status Former   Current packs/day: 0.75   Average packs/day: 0.8 packs/day for 20.0 years (15.0 ttl pk-yrs)   Types: Cigarettes  Smokeless Tobacco Former   Quit date: 10/31/1998    Goals Met:  Independence with exercise equipment Exercise tolerated well No report of concerns or symptoms today Strength training completed today  Goals Unmet:  Not Applicable  Comments: Pt able to follow exercise prescription today without complaint.  Will continue to monitor for progression.    Dr. Dina Rich is Medical Director for [facility] Cardiac Rehab

## 2022-11-06 NOTE — Progress Notes (Signed)
Pulmonary Individual Treatment Plan  Patient Details  Name: Paula [name] MRN: [id number] Date of Birth: [birth year] Referring Provider:   Flowsheet Row PULMONARY REHAB OTHER RESP ORIENTATION from 09/24/2022 in [facility] CARDIAC REHABILITATION  Referring Provider Warnell Forester MD       Initial Encounter Date:  Flowsheet Row PULMONARY REHAB OTHER RESP ORIENTATION from 09/24/2022 in [place] CARDIAC REHABILITATION  Date 09/24/22       Visit Diagnosis: Chronic obstructive pulmonary disease, unspecified COPD type (HCC)  Patient's Home Medications on Admission:   Current Outpatient Medications:    acetaminophen (TYLENOL) 500 MG tablet, Take 500-1,000 mg by mouth every 6 (six) hours as needed (pain.)., Disp: , Rfl:    albuterol (VENTOLIN HFA) 108 (90 Base) MCG/ACT inhaler, Inhale 2 puffs into the lungs every 6 (six) hours as needed for wheezing or shortness of breath., Disp: , Rfl:    azelastine (ASTELIN) 0.1 % nasal spray, Place 2 sprays into both nostrils 2 (two) times daily as needed for allergies. Use in each nostril as directed, Disp: , Rfl:    budesonide-formoterol (SYMBICORT) 160-4.5 MCG/ACT inhaler, Inhale 2 puffs into the lungs 2 (two) times daily., Disp: , Rfl:    Cholecalciferol (VITAMIN D) 125 MCG (5000 UT) CAPS, Take 5,000 Units by mouth in the morning., Disp: , Rfl:    clotrimazole-betamethasone (LOTRISONE) cream, Apply 1 application topically 2 (two) times daily as needed (skin irritation.). , Disp: , Rfl:    diclofenac (VOLTAREN) 75 MG EC tablet, Take 75 mg by mouth daily as needed (arthritis pain)., Disp: , Rfl:    ezetimibe (ZETIA) 10 MG tablet, Take 10 mg by mouth in the morning., Disp: , Rfl:    famotidine (PEPCID) 40 MG tablet, Take 40 mg by mouth at bedtime., Disp: , Rfl:    fluticasone (FLONASE) 50 MCG/ACT nasal spray, Place 2 sprays into both nostrils daily as needed for allergies., Disp: , Rfl:    guaiFENesin (MUCINEX) 600 MG 12 hr tablet, Take 600 mg by mouth  daily as needed (congestion/COPD issues)., Disp: , Rfl:    hydrochlorothiazide (HYDRODIURIL) 25 MG tablet, Take 25 mg by mouth in the morning., Disp: , Rfl:    Ketotifen Fumarate (ALLERGY EYE DROPS OP), Place 1 drop into both eyes 2 (two) times daily as needed (allergy eyes)., Disp: , Rfl:    loratadine (CLARITIN) 10 MG tablet, Take 10 mg by mouth daily as needed for allergies., Disp: , Rfl:    losartan (COZAAR) 100 MG tablet, Take 100 mg by mouth in the morning., Disp: , Rfl:    montelukast (SINGULAIR) 10 MG tablet, Take 10 mg by mouth at bedtime., Disp: , Rfl:    Multiple Vitamins-Minerals (PRESERVISION AREDS 2 PO), Take 1 tablet by mouth in the morning and at bedtime., Disp: , Rfl:    NON FORMULARY, CPAP, O2 at night, Disp: , Rfl:    omeprazole (PRILOSEC) 40 MG capsule, Take 40 mg by mouth daily before breakfast., Disp: , Rfl:    sertraline (ZOLOFT) 100 MG tablet, Take 100 mg by mouth at bedtime., Disp: , Rfl:    terbinafine (LAMISIL) 250 MG tablet, Take 250 mg by mouth in the morning., Disp: , Rfl:   Past Medical History: Past Medical History:  Diagnosis Date   Anxiety    Arthritis    COPD (chronic obstructive pulmonary disease) (HCC)    Heart murmur    Hypertension    Lung nodule    LLL   Sleep apnea  CPAP with O2 2 liters    Tubal ectopic pregnancy 1970   Vitelliform macular dystrophy     Tobacco Use: Social History   Tobacco Use  Smoking Status Former   Current packs/day: 0.75   Average packs/day: 0.8 packs/day for 20.0 years (15.0 ttl pk-yrs)   Types: Cigarettes  Smokeless Tobacco Former   Quit date: 10/31/1998    Labs: Review Flowsheet        No data to display          Capillary Blood Glucose: No results found for: "GLUCAP"   Pulmonary Assessment Scores:  Pulmonary Assessment Scores     Row Name 09/24/22 1454         ADL UCSD   ADL Phase Entry     SOB Score total 64     Rest 1     Walk 3     Stairs 4     Bath 3     Dress 1     Shop 4        CAT Score   CAT Score 18       mMRC Score   mMRC Score 3             UCSD: Self-administered rating of dyspnea associated with activities of daily living (ADLs) 6-point scale (0 = "not at all" to 5 = "maximal or unable to do because of breathlessness")  Scoring Scores range from 0 to 120.  Minimally important difference is 5 units  CAT: CAT can identify the health impairment of COPD patients and is better correlated with disease progression.  CAT has a scoring range of zero to 40. The CAT score is classified into four groups of low (less than 10), medium (10 - 20), high (21-30) and very high (31-40) based on the impact level of disease on health status. A CAT score over 10 suggests significant symptoms.  A worsening CAT score could be explained by an exacerbation, poor medication adherence, poor inhaler technique, or progression of COPD or comorbid conditions.  CAT MCID is 2 points  mMRC: mMRC (Modified Medical Research Council) Dyspnea Scale is used to assess the degree of baseline functional disability in patients of respiratory disease due to dyspnea. No minimal important difference is established. A decrease in score of 1 point or greater is considered a positive change.   Pulmonary Function Assessment:  Pulmonary Function Assessment - 09/24/22 1454       Breath   Shortness of Breath Yes;Fear of Shortness of Breath;Limiting activity             Exercise Target Goals: Exercise Program Goal: Individual exercise prescription set using results from initial 6 min walk test and THRR while considering  patient's activity barriers and safety.   Exercise Prescription Goal: Initial exercise prescription builds to 30-45 minutes a day of aerobic activity, 2-3 days per week.  Home exercise guidelines will be given to patient during program as part of exercise prescription that the participant will acknowledge.  Activity Barriers & Risk Stratification:  Activity Barriers &  Cardiac Risk Stratification - 09/24/22 1250       Activity Barriers & Cardiac Risk Stratification   Activity Barriers Arthritis;Deconditioning;Muscular Weakness;Shortness of Breath;Joint Problems;Balance Concerns   currently in PT for foot            6 Minute Walk:  6 Minute Walk     Row Name 09/24/22 1428         6 Minute Walk  Phase Initial     Distance 675 feet     Walk Time 4.4 minutes     # of Rest Breaks 2  46 sec, 38 sec     MPH 1.74     METS 1.01     RPE 13     Perceived Dyspnea  16     VO2 Peak 3.53     Symptoms Yes (comment)     Comments hip pain 7/10     Resting HR 78 bpm     Resting BP 128/70     Resting Oxygen Saturation  96 %     Exercise Oxygen Saturation  during 6 min walk 88 %     Max Ex. HR 116 bpm     Max Ex. BP 204/112     2 Minute Post BP 144/72       Interval HR   1 Minute HR 108     2 Minute HR 100  seated     3 Minute HR 101     4 Minute HR 116     5 Minute HR 107     6 Minute HR 109     2 Minute Post HR 71     Interval Heart Rate? Yes       Interval Oxygen   Interval Oxygen? Yes     Baseline Oxygen Saturation % 96 %     1 Minute Oxygen Saturation % 90 %     1 Minute Liters of Oxygen 0 L  Room Air     2 Minute Oxygen Saturation % 90 %  seated     2 Minute Liters of Oxygen 0 L     3 Minute Oxygen Saturation % 88 %     3 Minute Liters of Oxygen 0 L     4 Minute Oxygen Saturation % 89 %     4 Minute Liters of Oxygen 0 L     5 Minute Oxygen Saturation % 88 %     5 Minute Liters of Oxygen 0 L     6 Minute Oxygen Saturation % 89 %     6 Minute Liters of Oxygen 0 L     2 Minute Post Oxygen Saturation % 95 %     2 Minute Post Liters of Oxygen 0 L              Oxygen Initial Assessment:  Oxygen Initial Assessment - 09/24/22 1249       Home Oxygen   Home Oxygen Device Home Concentrator    Sleep Oxygen Prescription Continuous;CPAP    Liters per minute 2    Home Exercise Oxygen Prescription None    Home Resting Oxygen  Prescription None    Compliance with Home Oxygen Use Yes      Initial 6 min Walk   Oxygen Used None      Program Oxygen Prescription   Program Oxygen Prescription None      Intervention   Short Term Goals To learn and exhibit compliance with exercise, home and travel O2 prescription;To learn and understand importance of monitoring SPO2 with pulse oximeter and demonstrate accurate use of the pulse oximeter.;To learn and understand importance of maintaining oxygen saturations>88%;To learn and demonstrate proper pursed lip breathing techniques or other breathing techniques. ;To learn and demonstrate proper use of respiratory medications    Long  Term Goals Exhibits compliance with exercise, home  and travel O2 prescription;Verbalizes importance of monitoring SPO2 with pulse  oximeter and return demonstration;Maintenance of O2 saturations>88%;Exhibits proper breathing techniques, such as pursed lip breathing or other method taught during program session;Compliance with respiratory medication;Demonstrates proper use of MDI's             Oxygen Re-Evaluation:  Oxygen Re-Evaluation     Row Name 09/24/22 1446 10/02/22 1117 10/07/22 1341 10/14/22 1121 10/23/22 1210     Program Oxygen Prescription   Program Oxygen Prescription None None None None --     Home Oxygen   Home Oxygen Device Home Concentrator None None None --   Sleep Oxygen Prescription Continuous;CPAP Continuous;CPAP Continuous;CPAP Continuous;CPAP --   Liters per minute 2 -- 2 2 --   Home Exercise Oxygen Prescription None None None None --   Home Resting Oxygen Prescription None None None None --   Compliance with Home Oxygen Use Yes Yes Yes Yes --     Goals/Expected Outcomes   Short Term Goals To learn and demonstrate proper pursed lip breathing techniques or other breathing techniques. ;To learn and exhibit compliance with exercise, home and travel O2 prescription;To learn and understand importance of monitoring SPO2 with  pulse oximeter and demonstrate accurate use of the pulse oximeter.;To learn and understand importance of maintaining oxygen saturations>88%;To learn and demonstrate proper use of respiratory medications To learn and demonstrate proper pursed lip breathing techniques or other breathing techniques. ;To learn and exhibit compliance with exercise, home and travel O2 prescription;To learn and understand importance of monitoring SPO2 with pulse oximeter and demonstrate accurate use of the pulse oximeter.;To learn and understand importance of maintaining oxygen saturations>88%;To learn and demonstrate proper use of respiratory medications To learn and demonstrate proper pursed lip breathing techniques or other breathing techniques. ;To learn and exhibit compliance with exercise, home and travel O2 prescription;To learn and understand importance of monitoring SPO2 with pulse oximeter and demonstrate accurate use of the pulse oximeter.;To learn and understand importance of maintaining oxygen saturations>88%;To learn and demonstrate proper use of respiratory medications To learn and demonstrate proper pursed lip breathing techniques or other breathing techniques. ;To learn and exhibit compliance with exercise, home and travel O2 prescription;To learn and understand importance of monitoring SPO2 with pulse oximeter and demonstrate accurate use of the pulse oximeter.;To learn and understand importance of maintaining oxygen saturations>88%;To learn and demonstrate proper use of respiratory medications To learn and demonstrate proper pursed lip breathing techniques or other breathing techniques.    Long  Term Goals Exhibits proper breathing techniques, such as pursed lip breathing or other method taught during program session;Exhibits compliance with exercise, home  and travel O2 prescription;Verbalizes importance of monitoring SPO2 with pulse oximeter and return demonstration;Maintenance of O2 saturations>88%;Compliance with  respiratory medication;Demonstrates proper use of MDI's Exhibits proper breathing techniques, such as pursed lip breathing or other method taught during program session;Exhibits compliance with exercise, home  and travel O2 prescription;Verbalizes importance of monitoring SPO2 with pulse oximeter and return demonstration;Maintenance of O2 saturations>88%;Compliance with respiratory medication;Demonstrates proper use of MDI's Exhibits proper breathing techniques, such as pursed lip breathing or other method taught during program session;Exhibits compliance with exercise, home  and travel O2 prescription;Verbalizes importance of monitoring SPO2 with pulse oximeter and return demonstration;Maintenance of O2 saturations>88%;Compliance with respiratory medication;Demonstrates proper use of MDI's Exhibits proper breathing techniques, such as pursed lip breathing or other method taught during program session;Exhibits compliance with exercise, home  and travel O2 prescription;Verbalizes importance of monitoring SPO2 with pulse oximeter and return demonstration;Maintenance of O2 saturations>88%;Compliance with respiratory medication;Demonstrates proper use of MDI's Exhibits  proper breathing techniques, such as pursed lip breathing or other method taught during program session   Comments Reviewed PLB technique with pt.  Talked about how it works and it's importance in maintaining their exercise saturations. Talked to patient about triggers today.  She is also good about using her PLB to help with breath control.  She is compliant with her inhalers.  She will use her abuterol inhaler on bad breathing days.  She does not have a spacer, so we talked about trying to get her one to use at home. Talked to patient about triggers today.  She is also good about using her PLB to help with breath control.  She is compliant with her inhalers.  She will use her abuterol inhaler on bad breathing days.  She does not have a spacer, so we  talked about trying to get her one to use at home. Paula [name] is doing well in rehab. She is compliant with her CPAP and it helps her sleep through the night with it.  She feels good with it.  She continues to work on using her PLB to help with SOB.  She has been using her inhaler more during hot season as the heat irritates her breathing more. She now has a spacer and we reviewed how to use it. Diaphragmatic and PLB breathing explained and performed with patient. Patient has a better understanding of how to do these exercises to help with breathing performance and relaxation. Patient performed breathing techniques adequately and to practice further at home.   Goals/Expected Outcomes Short: Become more profiecient at using PLB.   Long: Become independent at using PLB. Short: Get spacer to use with inhalers  Long; Continuet use PLB and stay compliant with inhaler Short: Get spacer to use with inhalers  Long; Continuet use PLB and stay compliant with inhaler Short: Continue to work on using PLB routinely Long: Conitnued compliance Short: practice PLB and diaphragmatic breathing at home. Long: Use PLB and diaphragmatic breathing independently            Oxygen Discharge (Final Oxygen Re-Evaluation):  Oxygen Re-Evaluation - 10/23/22 1210       Goals/Expected Outcomes   Short Term Goals To learn and demonstrate proper pursed lip breathing techniques or other breathing techniques.     Long  Term Goals Exhibits proper breathing techniques, such as pursed lip breathing or other method taught during program session    Comments Diaphragmatic and PLB breathing explained and performed with patient. Patient has a better understanding of how to do these exercises to help with breathing performance and relaxation. Patient performed breathing techniques adequately and to practice further at home.    Goals/Expected Outcomes Short: practice PLB and diaphragmatic breathing at home. Long: Use PLB and diaphragmatic breathing  independently             Initial Exercise Prescription:  Initial Exercise Prescription - 09/24/22 1400       Date of Initial Exercise RX and Referring Provider   Date 09/24/22    Referring Provider Warnell Forester MD      Oxygen   Maintain Oxygen Saturation 88% or higher      Treadmill   MPH 1.5    Grade 0.5    Minutes 17    METs 2.25      NuStep   Level 1    SPM 80    Minutes 17    METs 1.5      REL-XR   Level 1  Watts 20    Speed 50    Minutes 15    METs 1.5      Prescription Details   Frequency (times per week) 3    Duration Progress to 30 minutes of continuous aerobic without signs/symptoms of physical distress      Intensity   THRR 40-80% of Max Heartrate 106-133    Ratings of Perceived Exertion 11-13    Perceived Dyspnea 0-4      Progression   Progression Continue to progress workloads to maintain intensity without signs/symptoms of physical distress.      Resistance Training   Training Prescription Yes    Weight 3 lb    Reps 10-15             Perform Capillary Blood Glucose checks as needed.  Exercise Prescription Changes:   Exercise Prescription Changes     Row Name 09/24/22 1400 10/07/22 1300 10/14/22 1100 10/21/22 1300       Response to Exercise   Blood Pressure (Admit) 128/70 140/62 -- 116/72    Blood Pressure (Exercise) 204/112 158/82 -- 112/70    Blood Pressure (Exit) 134/74 162/84 -- 112/64    Heart Rate (Admit) 78 bpm 87 bpm -- 71 bpm    Heart Rate (Exercise) 116 bpm 87 bpm -- 88 bpm    Heart Rate (Exit) 87 bpm 71 bpm -- 71 bpm    Oxygen Saturation (Admit) 96 % 92 % -- 97 %    Oxygen Saturation (Exercise) 88 % 93 % -- 93 %    Oxygen Saturation (Exit) 91 % 96 % -- 96 %    Rating of Perceived Exertion (Exercise) 13 13 -- 12    Perceived Dyspnea (Exercise) 16 1 -- 1    Symptoms hip pain 7/10 -- -- --    Comments walk test orientation -- -- --    Duration -- Continue with 30 min of aerobic exercise without  signs/symptoms of physical distress. -- Continue with 30 min of aerobic exercise without signs/symptoms of physical distress.    Intensity -- THRR unchanged -- THRR unchanged      Progression   Progression -- Continue to progress workloads to maintain intensity without signs/symptoms of physical distress. -- Continue to progress workloads to maintain intensity without signs/symptoms of physical distress.      Resistance Training   Training Prescription -- Yes -- Yes    Weight -- 3 -- 3    Reps -- 10-15 -- 10-15      NuStep   Level -- 1 -- 2    SPM -- 110 -- 116    Minutes -- 15 -- 15    METs -- 2 -- 2      REL-XR   Level -- 1 -- 2    Speed -- 48 -- 50    Minutes -- 15 -- 15    METs -- 1.5 -- 1.8      Home Exercise Plan   Plans to continue exercise at -- -- Home (comment)  walking, weights, videos --    Frequency -- -- Add 1 additional day to program exercise sessions. --    Initial Home Exercises Provided -- -- 10/14/22 --      Oxygen   Maintain Oxygen Saturation -- 88% or higher -- 88% or higher             Exercise Comments:   Exercise Goals and Review:   Exercise Goals     Row Name 09/24/22  1445 10/07/22 1334           Exercise Goals   Increase Physical Activity Yes Yes      Intervention Provide advice, education, support and counseling about physical activity/exercise needs.;Develop an individualized exercise prescription for aerobic and resistive training based on initial evaluation findings, risk stratification, comorbidities and participant's personal goals. Provide advice, education, support and counseling about physical activity/exercise needs.;Develop an individualized exercise prescription for aerobic and resistive training based on initial evaluation findings, risk stratification, comorbidities and participant's personal goals.      Expected Outcomes Short Term: Attend rehab on a regular basis to increase amount of physical activity.;Long Term: Add in  home exercise to make exercise part of routine and to increase amount of physical activity.;Long Term: Exercising regularly at least 3-5 days a week. Short Term: Attend rehab on a regular basis to increase amount of physical activity.;Long Term: Add in home exercise to make exercise part of routine and to increase amount of physical activity.;Long Term: Exercising regularly at least 3-5 days a week.      Increase Strength and Stamina Yes Yes      Intervention Provide advice, education, support and counseling about physical activity/exercise needs.;Develop an individualized exercise prescription for aerobic and resistive training based on initial evaluation findings, risk stratification, comorbidities and participant's personal goals. Provide advice, education, support and counseling about physical activity/exercise needs.;Develop an individualized exercise prescription for aerobic and resistive training based on initial evaluation findings, risk stratification, comorbidities and participant's personal goals.      Expected Outcomes Short Term: Increase workloads from initial exercise prescription for resistance, speed, and METs.;Short Term: Perform resistance training exercises routinely during rehab and add in resistance training at home;Long Term: Improve cardiorespiratory fitness, muscular endurance and strength as measured by increased METs and functional capacity ( ) Short Term: Increase workloads from initial exercise prescription for resistance, speed, and METs.;Short Term: Perform resistance training exercises routinely during rehab and add in resistance training at home;Long Term: Improve cardiorespiratory fitness, muscular endurance and strength as measured by increased METs and functional capacity ( )      Able to understand and use rate of perceived exertion (RPE) scale Yes Yes      Intervention Provide education and explanation on how to use RPE scale Provide education and explanation on how  to use RPE scale      Expected Outcomes Short Term: Able to use RPE daily in rehab to express subjective intensity level;Long Term:  Able to use RPE to guide intensity level when exercising independently Short Term: Able to use RPE daily in rehab to express subjective intensity level;Long Term:  Able to use RPE to guide intensity level when exercising independently      Able to understand and use Dyspnea scale Yes Yes      Intervention Provide education and explanation on how to use Dyspnea scale Provide education and explanation on how to use Dyspnea scale      Expected Outcomes Short Term: Able to use Dyspnea scale daily in rehab to express subjective sense of shortness of breath during exertion;Long Term: Able to use Dyspnea scale to guide intensity level when exercising independently Short Term: Able to use Dyspnea scale daily in rehab to express subjective sense of shortness of breath during exertion;Long Term: Able to use Dyspnea scale to guide intensity level when exercising independently      Knowledge and understanding of Target Heart Rate Range (THRR) Yes Yes      Intervention Provide education and  explanation of THRR including how the numbers were predicted and where they are located for reference Provide education and explanation of THRR including how the numbers were predicted and where they are located for reference      Expected Outcomes Short Term: Able to state/look up THRR;Short Term: Able to use daily as guideline for intensity in rehab;Long Term: Able to use THRR to govern intensity when exercising independently Short Term: Able to state/look up THRR;Short Term: Able to use daily as guideline for intensity in rehab;Long Term: Able to use THRR to govern intensity when exercising independently      Able to check pulse independently Yes Yes      Intervention Provide education and demonstration on how to check pulse in carotid and radial arteries.;Review the importance of being able to check  your own pulse for safety during independent exercise Provide education and demonstration on how to check pulse in carotid and radial arteries.;Review the importance of being able to check your own pulse for safety during independent exercise      Expected Outcomes Short Term: Able to explain why pulse checking is important during independent exercise;Long Term: Able to check pulse independently and accurately Short Term: Able to explain why pulse checking is important during independent exercise;Long Term: Able to check pulse independently and accurately      Understanding of Exercise Prescription Yes Yes      Intervention Provide education, explanation, and written materials on patient's individual exercise prescription Provide education, explanation, and written materials on patient's individual exercise prescription      Expected Outcomes Short Term: Able to explain program exercise prescription;Long Term: Able to explain home exercise prescription to exercise independently Short Term: Able to explain program exercise prescription;Long Term: Able to explain home exercise prescription to exercise independently               Exercise Goals Re-Evaluation :  Exercise Goals Re-Evaluation     Row Name 09/24/22 1445 10/07/22 1334 10/14/22 1134 10/21/22 1346       Exercise Goal Re-Evaluation   Exercise Goals Review Able to understand and use rate of perceived exertion (RPE) scale;Able to understand and use Dyspnea scale Increase Physical Activity;Increase Strength and Stamina;Able to understand and use rate of perceived exertion (RPE) scale;Able to understand and use Dyspnea scale;Knowledge and understanding of Target Heart Rate Range (THRR);Able to check pulse independently;Understanding of Exercise Prescription Increase Physical Activity;Increase Strength and Stamina;Able to understand and use rate of perceived exertion (RPE) scale;Able to understand and use Dyspnea scale;Knowledge and understanding  of Target Heart Rate Range (THRR);Able to check pulse independently;Understanding of Exercise Prescription --    Comments Reviewed RPE  and dyspnea scale and program prescription with pt today.  Pt voiced understanding and was given a copy of goals to take home. Pt has comepleted 4 sessions of pulmonary rehab. She is motivated to be back in the program after having foot surgery. She is progressing slowly. She is currently exercising a t2.0 METs on the stepper. Will continue to monitor and progress as able. Reviewed home exercise with pt today.  Pt plans to walk with dog and use weights at home for exercise.  We also talked about trying out staff videos.  She is also going to PT once a week.  Reviewed THR, pulse, RPE, sign and symptoms, pulse oximetery and when to call 911 or MD.  Also discussed weather considerations and indoor options.  Pt voiced understanding.  She is feeling better since starting rehab.  She wants to continue to work on weight loss Patient is enjoying rehab. She has increased her SPM and METs on the stepper.    Expected Outcomes Short: Use RPE daily to regulate intensity.  Long: Follow program prescription Through exercise at rehab and home, patient will achieve their goals. Short: Start to add in home exercise Long: Continue to build strength and stamina --             Discharge Exercise Prescription (Final Exercise Prescription Changes):  Exercise Prescription Changes - 10/21/22 1300       Response to Exercise   Blood Pressure (Admit) 116/72    Blood Pressure (Exercise) 112/70    Blood Pressure (Exit) 112/64    Heart Rate (Admit) 71 bpm    Heart Rate (Exercise) 88 bpm    Heart Rate (Exit) 71 bpm    Oxygen Saturation (Admit) 97 %    Oxygen Saturation (Exercise) 93 %    Oxygen Saturation (Exit) 96 %    Rating of Perceived Exertion (Exercise) 12    Perceived Dyspnea (Exercise) 1    Duration Continue with 30 min of aerobic exercise without signs/symptoms of physical  distress.    Intensity THRR unchanged      Progression   Progression Continue to progress workloads to maintain intensity without signs/symptoms of physical distress.      Resistance Training   Training Prescription Yes    Weight 3    Reps 10-15      NuStep   Level 2    SPM 116    Minutes 15    METs 2      REL-XR   Level 2    Speed 50    Minutes 15    METs 1.8      Oxygen   Maintain Oxygen Saturation 88% or higher             Nutrition:  Target Goals: Understanding of nutrition guidelines, daily intake of sodium 1500mg , cholesterol 200mg , calories 30% from fat and 7% or less from saturated fats, daily to have 5 or more servings of fruits and vegetables.  Biometrics:    Nutrition Therapy Plan and Nutrition Goals:  Nutrition Therapy & Goals - 09/30/22 1233       Nutrition Therapy   RD appointment deferred Yes      Personal Nutrition Goals   Comments We offer  educational sessions on heart healthy nutrition with handouts.      Intervention Plan   Intervention Nutrition handout(s) given to patient.    Expected Outcomes Short Term Goal: Understand basic principles of dietary content, such as calories, fat, sodium, cholesterol and nutrients.             Nutrition Assessments:  Nutrition Assessments - 09/24/22 1447       MEDFICTS Scores   Pre Score 50            MEDIFICTS Score Key: ?70 Need to make dietary changes  40-70 Heart Healthy Diet ? 40 Therapeutic Level Cholesterol Diet   Picture Your Plate Scores: <84 Unhealthy dietary pattern with much room for improvement. 41-50 Dietary pattern unlikely to meet recommendations for good health and room for improvement. 51-60 More healthful dietary pattern, with some room for improvement.  >60 Healthy dietary pattern, although there may be some specific behaviors that could be improved.    Nutrition Goals Re-Evaluation:  Nutrition Goals Re-Evaluation     Row Name 10/14/22 [phone]  Goals   Nutrition Goal Focus on listening to hunger cues       Comment Paula [name] has noticed that she will eat and then feel hungry shortly afterwards.  We talked about listening to her hunger cues, as she used to ignore them. She is now using water to help.  She is also working on focusing on healthy eating with more fruit and vegetables.  She is also working on portion control.       Expected Outcome Short: Focus on hunger cues and use water Long: Continue to improve diet                Nutrition Goals Discharge (Final Nutrition Goals Re-Evaluation):  Nutrition Goals Re-Evaluation - 10/14/22 1117       Goals   Nutrition Goal Focus on listening to hunger cues    Comment Paula [name] has noticed that she will eat and then feel hungry shortly afterwards.  We talked about listening to her hunger cues, as she used to ignore them. She is now using water to help.  She is also working on focusing on healthy eating with more fruit and vegetables.  She is also working on portion control.    Expected Outcome Short: Focus on hunger cues and use water Long: Continue to improve diet             Psychosocial: Target Goals: Acknowledge presence or absence of significant depression and/or stress, maximize coping skills, provide positive support system. Participant is able to verbalize types and ability to use techniques and skills needed for reducing stress and depression.  Initial Review & Psychosocial Screening:  Initial Psych Review & Screening - 09/24/22 1251       Initial Review   Current issues with Current Psychotropic Meds;Current Depression;Current Sleep Concerns;History of Depression      Family Dynamics   Good Support System? Yes   husband (doesn't help with diet), kids     Barriers   Psychosocial barriers to participate in program The patient should benefit from training in stress management and relaxation.;Psychosocial barriers identified (see note)      Screening Interventions    Interventions Encouraged to exercise;Provide feedback about the scores to participant;To provide support and resources with identified psychosocial needs    Expected Outcomes Short Term goal: Identification and review with participant of any Quality of Life or Depression concerns found by scoring the questionnaire.;Long Term Goal: Stressors or current issues are controlled or eliminated.;Long Term goal: The participant improves quality of Life and PHQ9 Scores as seen by post scores and/or verbalization of changes;Short Term goal: Utilizing psychosocial counselor, staff and physician to assist with identification of specific Stressors or current issues interfering with healing process. Setting desired goal for each stressor or current issue identified.             Quality of Life Scores:  Quality of Life - 09/24/22 1503       Quality of Life   Select Quality of Life      Quality of Life Scores   Health/Function Pre 13.41 %    Socioeconomic Pre 21.08 %    Psych/Spiritual Pre 17.43 %    Family Pre 24 %    GLOBAL Pre 16.94 %            Scores of 19 and below usually indicate a poorer quality of life in these areas.  A difference of  2-3 points is a clinically meaningful difference.  A difference of 2-3 points in  the total score of the Quality of Life Index has been associated with significant improvement in overall quality of life, self-image, physical symptoms, and general health in studies assessing change in quality of life.   PHQ-9: Review Flowsheet       10/14/2022 09/24/2022 04/30/2022 12/06/2021  Depression screen PHQ 2/9  Decreased Interest 1 1 1 2   Down, Depressed, Hopeless 0 1 1 0  PHQ - 2 Score 1 2 2 2   Altered sleeping 0 0 1 1  Tired, decreased energy 2 2 2 2   Change in appetite 1 2 2 2   Feeling bad or failure about yourself  1 0 1 0  Trouble concentrating 0 0 0 0  Moving slowly or fidgety/restless 0 1 0 0  Suicidal thoughts 0 0 0 0  PHQ-9 Score 5 7 8 7   Difficult  doing work/chores Somewhat difficult Very difficult Not difficult at all Somewhat difficult    Details           Interpretation of Total Score  Total Score Depression Severity:  1-4 = Minimal depression, 5-9 = Mild depression, 10-14 = Moderate depression, 15-19 = Moderately severe depression, 20-27 = Severe depression   Psychosocial Evaluation and Intervention:  Psychosocial Evaluation - 09/24/22 1430       Psychosocial Evaluation & Interventions   Interventions Stress management education;Encouraged to exercise with the program and follow exercise prescription    Comments Paula [name] is coming into pulmonary rehab for COPD.  She was not able to finish program last time due to a foot surgery but knew it made a big difference for her.  She is eager to return and get moving again.  She does not have any barriers to getting to rehab other than multiple doctor's appointments which we discussed making up those up at end.  She lives with her husband who is the primary cook, which causes her stress.  Her husband and cousins are her support system.  She is a retired Archivist.  Paula [name] has a history of depression and is currently on meds but feels well managed at this time.  Her PHQ indicated mil depression (6) but she feels it comes from not being able to do everything she wants to currently.  She enjoys working in her garden, but not able to do as much curerntly and would like to be to get back in the garden.  She really wants to be able to breathe better and increase her stamina in rehab.  She would really like to work on weight loss as part of being able to move around better.  Her biggest goal is to be able to dance at her niece's wedding in October!  She wants to enjoy and have fun.    Expected Outcomes Short: Attend rehab to start to build stamina and improve breathing Long: Continue to find coping skills to help manage depression and continue to build stamina    Continue Psychosocial Services  Follow  up required by staff             Psychosocial Re-Evaluation:  Psychosocial Re-Evaluation     Row Name 09/30/22 1234 10/14/22 1114           Psychosocial Re-Evaluation   Current issues with Current Depression;Current Sleep Concerns;Current Psychotropic Meds Current Depression;Current Sleep Concerns;Current Psychotropic Meds      Comments Patient is new to the program. She has completed 2 sessions. She continues to have no psychosocial barriers identified. Her depressions continues to be managed  with Sertraline. She has participated in the program before and enjoys the sessions and demonstrates an interest in improving her health. Her personal goals for the program breathe better; increase her stamina; lose weight; and be able to dance at her neice's wedding on October. We will continue to monitor her progress. Paula [name] is doing well in rehab.  She is feeling better overall.  Her PHQ has improved from 7 down to 5.  We will continue to monitor her PHQ.  She still struggles with sleep and will feel bad about herself some times, but that is improving.  She feels that her mood is stable and her Zoloft is working for her. We talked about the exercise helping as well.      Expected Outcomes Pt's depression and sleep will continue to be managed, and she will have no other identifiable psychosocial issues. Short: Continue to exercise for mental boost Long: continue to be compliant with meds.      Interventions Stress management education;Relaxation education Encouraged to attend Pulmonary Rehabilitation for the exercise;Stress management education      Continue Psychosocial Services  No Follow up required Follow up required by staff               Psychosocial Discharge (Final Psychosocial Re-Evaluation):  Psychosocial Re-Evaluation - 10/14/22 1114       Psychosocial Re-Evaluation   Current issues with Current Depression;Current Sleep Concerns;Current Psychotropic Meds    Comments Paula [name] is doing  well in rehab.  She is feeling better overall.  Her PHQ has improved from 7 down to 5.  We will continue to monitor her PHQ.  She still struggles with sleep and will feel bad about herself some times, but that is improving.  She feels that her mood is stable and her Zoloft is working for her. We talked about the exercise helping as well.    Expected Outcomes Short: Continue to exercise for mental boost Long: continue to be compliant with meds.    Interventions Encouraged to attend Pulmonary Rehabilitation for the exercise;Stress management education    Continue Psychosocial Services  Follow up required by staff              Education: Education Goals: Education classes will be provided on a weekly basis, covering required topics. Participant will state understanding/return demonstration of topics presented.  Learning Barriers/Preferences:  Learning Barriers/Preferences - 09/24/22 1248       Learning Barriers/Preferences   Learning Barriers Sight   glasses   Learning Preferences Written Material;Skilled Demonstration             Education Topics: How Lungs Work and Diseases: - Discuss the anatomy of the lungs and diseases that can affect the lungs, such as COPD. Flowsheet Row PULMONARY REHAB OTHER RESPIRATORY from 04/25/2022 in [place] [facility]  Date 12/27/21  Educator DF  Instruction Review Code 1- Verbalizes Understanding       Exercise: -Discuss the importance of exercise, FITT principles of exercise, normal and abnormal responses to exercise, and how to exercise safely.   Environmental Irritants: -Discuss types of environmental irritants and how to limit exposure to environmental irritants. Flowsheet Row PULMONARY REHAB OTHER RESPIRATORY from 10/30/2022 in [place] [facility]  Date 10/02/22  Educator [facility]  Instruction Review Code 1- Verbalizes Understanding       Meds/Inhalers and oxygen: - Discuss respiratory medications, definition  of an inhaler and oxygen, and the proper way to use an inhaler and oxygen. Flowsheet Row PULMONARY REHAB OTHER  RESPIRATORY from 10/30/2022 in [place] [facility]  Date 10/09/22  Educator [facility]       Energy Saving Techniques: - Discuss methods to conserve energy and decrease shortness of breath when performing activities of daily living.  Flowsheet Row PULMONARY REHAB OTHER RESPIRATORY from 10/30/2022 in [place] [facility]  Date 10/16/22  Educator Handout  Instruction Review Code 1- Verbalizes Understanding       Bronchial Hygiene / Breathing Techniques: - Discuss breathing mechanics, pursed-lip breathing technique,  proper posture, effective ways to clear airways, and other functional breathing techniques Flowsheet Row PULMONARY REHAB OTHER RESPIRATORY from 10/30/2022 in [place] [facility]  Date 10/23/22  Educator [facility]  Instruction Review Code 1- Personnel officer: - Provides group verbal and written instruction about the health risks of elevated stress, cause of high stress, and healthy ways to reduce stress.   Nutrition I: Fats: - Discuss the types of cholesterol, what cholesterol does to the body, and how cholesterol levels can be controlled. Flowsheet Row PULMONARY REHAB OTHER RESPIRATORY from 04/25/2022 in [place] [facility]  Date 02/07/22  Educator handout  Instruction Review Code 1- Verbalizes Understanding       Nutrition II: Labels: -Discuss the different components of food labels and how to read food labels. Flowsheet Row PULMONARY REHAB OTHER RESPIRATORY from 04/25/2022 in [place] [facility]  Date 02/21/22  Educator HB  Instruction Review Code 1- Verbalizes Understanding       Respiratory Infections: - Discuss the signs and symptoms of respiratory infections, ways to prevent respiratory infections, and the importance of seeking medical treatment when having a  respiratory infection.   Stress I: Signs and Symptoms: - Discuss the causes of stress, how stress may lead to anxiety and depression, and ways to limit stress. Flowsheet Row PULMONARY REHAB OTHER RESPIRATORY from 04/25/2022 in [place] [facility]  Date 03/07/22  Educator DF  Instruction Review Code 2- Demonstrated Understanding       Stress II: Relaxation: -Discuss relaxation techniques to limit stress. Flowsheet Row PULMONARY REHAB OTHER RESPIRATORY from 04/25/2022 in [place] [facility]  Date 12/13/21  Educator DM  Instruction Review Code 1- Verbalizes Understanding       Oxygen for Home/Travel: - Discuss how to prepare for travel when on oxygen and proper ways to transport and store oxygen to ensure safety. Flowsheet Row PULMONARY REHAB OTHER RESPIRATORY from 04/25/2022 in [place] [facility]  Date 12/20/21  Educator DF  Instruction Review Code 1- Verbalizes Understanding       Knowledge Questionnaire Score:  Knowledge Questionnaire Score - 09/24/22 1452       Knowledge Questionnaire Score   Pre Score 16/18   missed O2 safety and saturations            Core Components/Risk Factors/Patient Goals at Admission:  Personal Goals and Risk Factors at Admission - 09/24/22 1255       Core Components/Risk Factors/Patient Goals on Admission    Weight Management Yes;Obesity;Weight Loss    Intervention Weight Management: Provide education and appropriate resources to help participant work on and attain dietary goals.;Weight Management/Obesity: Establish reasonable short term and long term weight goals.;Obesity: Provide education and appropriate resources to help participant work on and attain dietary goals.    Admit Weight 282 lb 6.4 oz (128.1 kg)    Goal Weight: Short Term 277 lb (125.6 kg)    Goal Weight: Long Term 272 lb (123.4 kg)  Expected Outcomes Short Term: Continue to assess and modify interventions until short term weight  is achieved;Long Term: Adherence to nutrition and physical activity/exercise program aimed toward attainment of established weight goal;Weight Loss: Understanding of general recommendations for a balanced deficit meal plan, which promotes 1-2 lb weight loss per week and includes a negative energy balance of [phone] kcal/d;Understanding recommendations for meals to include 15-35% energy as protein, 25-35% energy from fat, 35-60% energy from carbohydrates, less than 200mg  of dietary cholesterol, 20-35 gm of total fiber daily;Understanding of distribution of calorie intake throughout the day with the consumption of 4-5 meals/snacks    Improve shortness of breath with ADL's Yes    Intervention Provide education, individualized exercise plan and daily activity instruction to help decrease symptoms of SOB with activities of daily living.    Expected Outcomes Short Term: Improve cardiorespiratory fitness to achieve a reduction of symptoms when performing ADLs;Long Term: Be able to perform more ADLs without symptoms or delay the onset of symptoms    Increase knowledge of respiratory medications and ability to use respiratory devices properly  Yes    Intervention Provide education and demonstration as needed of appropriate use of medications, inhalers, and oxygen therapy.    Expected Outcomes Short Term: Achieves understanding of medications use. Understands that oxygen is a medication prescribed by physician. Demonstrates appropriate use of inhaler and oxygen therapy.;Long Term: Maintain appropriate use of medications, inhalers, and oxygen therapy.    Hypertension Yes    Intervention Provide education on lifestyle modifcations including regular physical activity/exercise, weight management, moderate sodium restriction and increased consumption of fresh fruit, vegetables, and low fat dairy, alcohol moderation, and smoking cessation.;Monitor prescription use compliance.    Expected Outcomes Short Term: Continued  assessment and intervention until BP is < 140/74mm HG in hypertensive participants. < 130/32mm HG in hypertensive participants with diabetes, heart failure or chronic kidney disease.;Long Term: Maintenance of blood pressure at goal levels.             Core Components/Risk Factors/Patient Goals Review:   Goals and Risk Factor Review     Row Name 09/30/22 1237 10/14/22 1120           Core Components/Risk Factors/Patient Goals Review   Personal Goals Review Weight Management/Obesity;Increase knowledge of respiratory medications and ability to use respiratory devices properly.;Improve shortness of breath with ADL's;Other;Hypertension Weight Management/Obesity;Increase knowledge of respiratory medications and ability to use respiratory devices properly.;Improve shortness of breath with ADL's;Other;Hypertension      Review Patient was referred to PR with COPD. She has completed 1 session. Her current weight is 282.4 lbs up 0.4 lbs since her initial visit. She exercises on RA with O2 saturations at 93%. Her blood pressure is hypertensive today. We will monitor her progress in the program. Paula [name] is doing well in rehab. Her weight is holding steady.  She is pleased that it is not going up.  We have not started to add in home exercise yet, but we talked about how that will help as well.  She is doing better with her breathing for most part, however, heat still gets to her.   She is doing well with meds and will use her inhaler more on bad breathing days.      Expected Outcomes Patient will complete the program meeting both personal and program goals. Short: Conitnue to work on breathing Long: Conitnue to work on [employer] loss               Core Components/Risk Factors/Patient  Goals at Discharge (Final Review):   Goals and Risk Factor Review - 10/14/22 1120       Core Components/Risk Factors/Patient Goals Review   Personal Goals Review Weight Management/Obesity;Increase knowledge of respiratory  medications and ability to use respiratory devices properly.;Improve shortness of breath with ADL's;Other;Hypertension    Review Paula [name] is doing well in rehab. Her weight is holding steady.  She is pleased that it is not going up.  We have not started to add in home exercise yet, but we talked about how that will help as well.  She is doing better with her breathing for most part, however, heat still gets to her.   She is doing well with meds and will use her inhaler more on bad breathing days.    Expected Outcomes Short: Conitnue to work on breathing Long: Conitnue to work on weight loss             ITP Comments:  ITP Comments     Row Name 09/24/22 1419 10/09/22 1249 11/06/22 0833       ITP Comments Pt attended orientation today for Pulmonary Rehab. Completed and gym orientation. Documentation for diagnosis can be found in CE office visit 08/05/22.  Initial ITP created and sent for review to Dr. York Grice, Medical Director. Pt service time 1225-1330. 30 day review completed. ITP sent to Dr.Jehanzeb Memon, Medical Director of  Pulmonary Rehab. Continue with ITP unless changes are made by physician. 30 day review completed. ITP sent to Dr.Jehanzeb Memon, Medical Director of  Pulmonary Rehab. Continue with ITP unless changes are made by physician.              Comments: 30 day review

## 2022-11-08 ENCOUNTER — Encounter (HOSPITAL_COMMUNITY): Payer: Medicare Other

## 2022-11-08 ENCOUNTER — Encounter (HOSPITAL_COMMUNITY)
Admission: RE | Admit: 2022-11-08 | Discharge: 2022-11-08 | Disposition: A | Discharge status: Home / Self Care | Payer: Medicare Other | Source: Ambulatory Visit | Attending: Internal Medicine | Admitting: Internal Medicine

## 2022-11-08 ENCOUNTER — Ambulatory Visit (HOSPITAL_COMMUNITY): Payer: Medicare Other

## 2022-11-08 DIAGNOSIS — J449 Chronic obstructive pulmonary disease, unspecified: Secondary | ICD-10-CM | POA: Diagnosis not present

## 2022-11-08 NOTE — Progress Notes (Signed)
Daily Session Note  Patient Details  Name: Paula [name] MRN: [id number] Date of Birth: May 08, 1950 Referring Provider:   Flowsheet Row PULMONARY REHAB OTHER RESP ORIENTATION from 09/24/2022 in [facility] CARDIAC REHABILITATION  Referring Provider Warnell Forester MD       Encounter Date: 11/08/2022  Check In:  Session Check In - 11/08/22 1045       Check-In   Supervising physician immediately available to respond to emergencies See telemetry face sheet for immediately available ER MD    Location [facility]    Staff Present Ross Ludwig, BS, Exercise Physiologist;Daphyne Daphine Deutscher, RN, Neal Dy, RN, BSN    Virtual Visit No    Medication changes reported     No    Fall or balance concerns reported    No    Warm-up and Cool-down Performed on first and last piece of equipment    Resistance Training Performed Yes    VAD Patient? No    PAD/SET Patient? No      Pain Assessment   Currently in Pain? Yes    Pain Score 6     Pain Location Back    Pain Descriptors / Indicators Aching    Pain Type Chronic pain    Pain Onset Yesterday    Multiple Pain Sites Yes      2nd Pain Site   Pain Score 6    Pain Location Neck    Pain Descriptors / Indicators Aching    Pain Type Chronic pain    Pain Onset Yesterday    Pain Frequency Intermittent      3rd Pain Site   Pain Score 6    Pain Location Leg   Bilaterally   Pain Descriptors / Indicators Aching    Pain Type Chronic pain    Pain Onset Yesterday    Pain Frequency Intermittent             Capillary Blood Glucose: No results found for this or any previous visit (from the past 24 hour(s)).    Social History   Tobacco Use  Smoking Status Former   Current packs/day: 0.75   Average packs/day: 0.8 packs/day for 20.0 years (15.0 ttl pk-yrs)   Types: Cigarettes  Smokeless Tobacco Former   Quit date: 10/31/1998    Goals Met:  Proper associated with RPD/PD & O2 Sat Independence with exercise  equipment Using PLB without cueing & demonstrates good technique Exercise tolerated well No report of concerns or symptoms today Strength training completed today  Goals Unmet:  Not Applicable  Comments: Pt able to follow exercise prescription today without complaint.  Will continue to monitor for progression.    Dr. Erick Blinks is Medical Director for [facility] Pulmonary Rehab.

## 2022-11-08 NOTE — Progress Notes (Deleted)
Daily Session Note  Patient Details  Name: Paula [name] MRN: [id number] Date of Birth: 09-25-1950 Referring Provider:   Flowsheet Row PULMONARY REHAB OTHER RESP ORIENTATION from 09/24/2022 in [facility] CARDIAC REHABILITATION  Referring Provider Warnell Forester MD       Encounter Date: 11/08/2022  Check In:  Session Check In - 11/08/22 1045       Check-In   Supervising physician immediately available to respond to emergencies See telemetry face sheet for immediately available ER MD    Location [facility]    Staff Present Ross Ludwig, BS, Exercise Physiologist;Daphyne Daphine Deutscher, RN, Neal Dy, RN, BSN    Virtual Visit No    Medication changes reported     No    Fall or balance concerns reported    No    Warm-up and Cool-down Performed on first and last piece of equipment    Resistance Training Performed Yes    VAD Patient? No    PAD/SET Patient? No      Pain Assessment   Currently in Pain? Yes    Pain Score 6     Pain Location Back    Pain Descriptors / Indicators Aching    Pain Type Chronic pain    Pain Onset Yesterday    Multiple Pain Sites Yes      2nd Pain Site   Pain Score 6    Pain Location Neck    Pain Descriptors / Indicators Aching    Pain Type Chronic pain    Pain Onset Yesterday    Pain Frequency Intermittent      3rd Pain Site   Pain Score 6    Pain Location Leg   Bilaterally   Pain Descriptors / Indicators Aching    Pain Type Chronic pain    Pain Onset Yesterday    Pain Frequency Intermittent             Capillary Blood Glucose: No results found for this or any previous visit (from the past 24 hour(s)).    Social History   Tobacco Use  Smoking Status Former   Current packs/day: 0.75   Average packs/day: 0.8 packs/day for 20.0 years (15.0 ttl pk-yrs)   Types: Cigarettes  Smokeless Tobacco Former   Quit date: 10/31/1998    Goals Met:  Independence with exercise equipment Exercise tolerated well No  report of concerns or symptoms today Strength training completed today  Goals Unmet:  Not Applicable  Comments: Pt able to follow exercise prescription today without complaint.  Will continue to monitor for progression.    Dr. Dina Rich is Medical Director for [facility] Cardiac Rehab

## 2022-11-11 ENCOUNTER — Encounter (HOSPITAL_COMMUNITY)
Admission: RE | Admit: 2022-11-11 | Discharge: 2022-11-11 | Disposition: A | Discharge status: Home / Self Care | Payer: Medicare Other | Source: Ambulatory Visit | Attending: Internal Medicine | Admitting: Internal Medicine

## 2022-11-11 ENCOUNTER — Encounter (HOSPITAL_COMMUNITY): Payer: Medicare Other

## 2022-11-11 ENCOUNTER — Ambulatory Visit (HOSPITAL_COMMUNITY): Payer: Medicare Other

## 2022-11-11 DIAGNOSIS — J449 Chronic obstructive pulmonary disease, unspecified: Secondary | ICD-10-CM

## 2022-11-11 NOTE — Progress Notes (Signed)
Daily Session Note  Patient Details  Name: Paula [name] MRN: [id number] Date of Birth: [birth year] Referring Provider:   Flowsheet Row PULMONARY REHAB OTHER RESP ORIENTATION from 09/24/2022 in [facility] CARDIAC REHABILITATION  Referring Provider Warnell Forester MD       Encounter Date: 11/11/2022  Check In:  Session Check In - 11/11/22 1045       Check-In   Supervising physician immediately available to respond to emergencies See telemetry face sheet for immediately available ER MD    Location [facility]    Staff Present Rodena Medin, RN, Pleas Koch, RN, BSN    Virtual Visit No    Medication changes reported     No    Fall or balance concerns reported    No    Warm-up and Cool-down Performed on first and last piece of equipment    Resistance Training Performed Yes    VAD Patient? No    PAD/SET Patient? No      Pain Assessment   Currently in Pain? No/denies    Pain Score 0-No pain    Multiple Pain Sites No             Capillary Blood Glucose: No results found for this or any previous visit (from the past 24 hour(s)).    Social History   Tobacco Use  Smoking Status Former   Current packs/day: 0.75   Average packs/day: 0.8 packs/day for 20.0 years (15.0 ttl pk-yrs)   Types: Cigarettes  Smokeless Tobacco Former   Quit date: 10/31/1998    Goals Met:  Proper associated with RPD/PD & O2 Sat Independence with exercise equipment Using PLB without cueing & demonstrates good technique Exercise tolerated well No report of concerns or symptoms today Strength training completed today  Goals Unmet:  Not Applicable  Comments: Pt able to follow exercise prescription today without complaint.  Will continue to monitor for progression.    Dr. Erick Blinks is Medical Director for [facility] Pulmonary Rehab.

## 2022-11-11 NOTE — Progress Notes (Deleted)
Daily Session Note  Patient Details  Name: Paula [name] MRN: [id number] Date of Birth: [birth year] Referring Provider:   Flowsheet Row PULMONARY REHAB OTHER RESP ORIENTATION from 09/24/2022 in [facility] CARDIAC REHABILITATION  Referring Provider Warnell Forester MD       Encounter Date: 11/11/2022  Check In:  Session Check In - 11/11/22 1045       Check-In   Supervising physician immediately available to respond to emergencies See telemetry face sheet for immediately available ER MD    Location [facility]    Staff Present Rodena Medin, RN, Pleas Koch, RN, BSN    Virtual Visit No    Medication changes reported     No    Fall or balance concerns reported    No    Warm-up and Cool-down Performed on first and last piece of equipment    Resistance Training Performed Yes    VAD Patient? No    PAD/SET Patient? No      Pain Assessment   Currently in Pain? No/denies    Pain Score 0-No pain    Multiple Pain Sites No             Capillary Blood Glucose: No results found for this or any previous visit (from the past 24 hour(s)).    Social History   Tobacco Use  Smoking Status Former   Current packs/day: 0.75   Average packs/day: 0.8 packs/day for 20.0 years (15.0 ttl pk-yrs)   Types: Cigarettes  Smokeless Tobacco Former   Quit date: 10/31/1998    Goals Met:  Independence with exercise equipment Exercise tolerated well No report of concerns or symptoms today Strength training completed today  Goals Unmet:  Not Applicable  Comments: Pt able to follow exercise prescription today without complaint.  Will continue to monitor for progression.    Dr. Erick Blinks is Medical Director for [facility] Pulmonary Rehab.

## 2022-11-13 ENCOUNTER — Encounter (HOSPITAL_COMMUNITY): Payer: Medicare Other

## 2022-11-13 ENCOUNTER — Ambulatory Visit (HOSPITAL_COMMUNITY): Payer: Medicare Other

## 2022-11-15 ENCOUNTER — Other Ambulatory Visit: Payer: Self-pay | Admitting: Internal Medicine

## 2022-11-15 ENCOUNTER — Encounter (HOSPITAL_COMMUNITY): Payer: Medicare Other

## 2022-11-15 ENCOUNTER — Ambulatory Visit (HOSPITAL_COMMUNITY): Payer: Medicare Other

## 2022-11-18 ENCOUNTER — Encounter (HOSPITAL_COMMUNITY): Payer: Medicare Other

## 2022-11-18 ENCOUNTER — Ambulatory Visit (HOSPITAL_COMMUNITY): Payer: Medicare Other

## 2022-11-18 ENCOUNTER — Encounter (HOSPITAL_COMMUNITY): Admission: RE | Admit: 2022-11-18 | Payer: Medicare Other | Source: Ambulatory Visit

## 2022-11-18 DIAGNOSIS — J449 Chronic obstructive pulmonary disease, unspecified: Secondary | ICD-10-CM

## 2022-11-18 NOTE — Progress Notes (Signed)
Daily Session Note  Patient Details  Name: Paula [name] MRN: [id number] Date of Birth: 1951/02/28 Referring Provider:   Flowsheet Row PULMONARY REHAB OTHER RESP ORIENTATION from 09/24/2022 in [facility] CARDIAC REHABILITATION  Referring Provider Warnell Forester MD       Encounter Date: 11/18/2022  Check In:  Session Check In - 11/18/22 1045       Check-In   Supervising physician immediately available to respond to emergencies See telemetry face sheet for immediately available ER MD    Location [facility]    Staff Present Fabio Pierce, MA, RCEP, CCRP, Dow Adolph, RN, BSN;Heather Fredric Mare, [place], Exercise Physiologist    Virtual Visit No    Medication changes reported     No    Fall or balance concerns reported    No    Warm-up and Cool-down Performed on first and last piece of equipment    Resistance Training Performed Yes    VAD Patient? No    PAD/SET Patient? No      Pain Assessment   Currently in Pain? No/denies    Pain Score 0-No pain    Multiple Pain Sites No             Capillary Blood Glucose: No results found for this or any previous visit (from the past 24 hour(s)).    Social History   Tobacco Use  Smoking Status Former   Current packs/day: 0.75   Average packs/day: 0.8 packs/day for 20.0 years (15.0 ttl pk-yrs)   Types: Cigarettes  Smokeless Tobacco Former   Quit date: 10/31/1998    Goals Met:  Proper associated with RPD/PD & O2 Sat Independence with exercise equipment Using PLB without cueing & demonstrates good technique Exercise tolerated well No report of concerns or symptoms today Strength training completed today  Goals Unmet:  Not Applicable  Comments: Pt able to follow exercise prescription today without complaint.  Will continue to monitor for progression.    Dr. Erick Blinks is Medical Director for [facility] Pulmonary Rehab.

## 2022-11-20 ENCOUNTER — Encounter (HOSPITAL_COMMUNITY): Payer: Medicare Other

## 2022-11-20 ENCOUNTER — Encounter (HOSPITAL_COMMUNITY)
Admission: RE | Admit: 2022-11-20 | Discharge: 2022-11-20 | Disposition: A | Discharge status: Home / Self Care | Payer: Medicare Other | Source: Ambulatory Visit | Attending: Internal Medicine | Admitting: Internal Medicine

## 2022-11-20 ENCOUNTER — Ambulatory Visit (HOSPITAL_COMMUNITY): Payer: Medicare Other

## 2022-11-20 DIAGNOSIS — J449 Chronic obstructive pulmonary disease, unspecified: Secondary | ICD-10-CM | POA: Diagnosis not present

## 2022-11-20 NOTE — Progress Notes (Signed)
Daily Session Note  Patient Details  Name: Paula [name] MRN: [id number] Date of Birth: [birth year] Referring Provider:   Flowsheet Row PULMONARY REHAB OTHER RESP ORIENTATION from 09/24/2022 in [facility] CARDIAC REHABILITATION  Referring Provider Warnell Forester MD       Encounter Date: 11/20/2022  Check In:  Session Check In - 11/20/22 1020       Check-In   Supervising physician immediately available to respond to emergencies See telemetry face sheet for immediately available MD    Location [facility]    Staff Present Ross Ludwig, BS, Exercise Physiologist;Jessica [place], MA, RCEP, CCRP, CCET;[employer], RN;Debra Laural Benes, RN, BSN    Virtual Visit No    Medication changes reported     No    Fall or balance concerns reported    No    Tobacco Cessation No Change    Warm-up and Cool-down Performed on first and last piece of equipment    Resistance Training Performed Yes    VAD Patient? No    PAD/SET Patient? No      Pain Assessment   Currently in Pain? No/denies    Pain Score 0-No pain    Multiple Pain Sites No             Capillary Blood Glucose: No results found for this or any previous visit (from the past 24 hour(s)).    Social History   Tobacco Use  Smoking Status Former   Current packs/day: 0.75   Average packs/day: 0.8 packs/day for 20.0 years (15.0 ttl pk-yrs)   Types: Cigarettes  Smokeless Tobacco Former   Quit date: 10/31/1998    Goals Met:  Proper associated with RPD/PD & O2 Sat Independence with exercise equipment Using PLB without cueing & demonstrates good technique Exercise tolerated well Queuing for purse lip breathing No report of concerns or symptoms today Strength training completed today  Goals Unmet:  Not Applicable  Comments: Marland KitchenMarland KitchenPt able to follow exercise prescription today without complaint.  Will continue to monitor for progression.    Dr. Erick Blinks is Medical Director for [facility]  Pulmonary Rehab.

## 2022-11-22 ENCOUNTER — Encounter (HOSPITAL_COMMUNITY): Admission: RE | Admit: 2022-11-22 | Payer: Medicare Other | Source: Ambulatory Visit

## 2022-11-22 ENCOUNTER — Ambulatory Visit (HOSPITAL_COMMUNITY): Payer: Medicare Other

## 2022-11-22 ENCOUNTER — Encounter (HOSPITAL_COMMUNITY): Payer: Medicare Other

## 2022-11-22 DIAGNOSIS — J449 Chronic obstructive pulmonary disease, unspecified: Secondary | ICD-10-CM

## 2022-11-22 NOTE — Progress Notes (Signed)
Daily Session Note  Patient Details  Name: Paula [name] MRN: [id number] Date of Birth: 1950-10-01 Referring Provider:   Flowsheet Row PULMONARY REHAB OTHER RESP ORIENTATION from 09/24/2022 in [facility] CARDIAC REHABILITATION  Referring Provider Warnell Forester MD       Encounter Date: 11/22/2022  Check In:  Session Check In - 11/22/22 1100       Check-In   Supervising physician immediately available to respond to emergencies See telemetry face sheet for immediately available MD    Location [facility]    Staff Present Ross Ludwig, BS, Exercise Physiologist;Jessica Juanetta Gosling, MA, RCEP, CCRP, CCET    Staff Present Kary Kos, MS, Exercise Physiologist    Virtual Visit No    Medication changes reported     No    Fall or balance concerns reported    No    Tobacco Cessation No Change    Warm-up and Cool-down Performed on first and last piece of equipment    Resistance Training Performed Yes    VAD Patient? No    PAD/SET Patient? No      Pain Assessment   Currently in Pain? No/denies    Pain Score 0-No pain    Multiple Pain Sites No             Capillary Blood Glucose: No results found for this or any previous visit (from the past 24 hour(s)).    Social History   Tobacco Use  Smoking Status Former   Current packs/day: 0.75   Average packs/day: 0.8 packs/day for 20.0 years (15.0 ttl pk-yrs)   Types: Cigarettes  Smokeless Tobacco Former   Quit date: 10/31/1998    Goals Met:  Independence with exercise equipment Exercise tolerated well No report of concerns or symptoms today  Goals Unmet:  Not Applicable  Comments: Pt able to follow exercise prescription today without complaint.  Will continue to monitor for progression.    Dr. Erick Blinks is Medical Director for [facility] Pulmonary Rehab.

## 2022-11-25 ENCOUNTER — Encounter (HOSPITAL_COMMUNITY): Payer: Medicare Other

## 2022-11-25 ENCOUNTER — Ambulatory Visit (HOSPITAL_COMMUNITY): Payer: Medicare Other

## 2022-11-27 ENCOUNTER — Encounter (HOSPITAL_COMMUNITY)
Admission: RE | Admit: 2022-11-27 | Discharge: 2022-11-27 | Disposition: A | Discharge status: Home / Self Care | Payer: Medicare Other | Source: Ambulatory Visit | Attending: Internal Medicine | Admitting: Internal Medicine

## 2022-11-27 ENCOUNTER — Encounter (HOSPITAL_COMMUNITY): Payer: Medicare Other

## 2022-11-27 ENCOUNTER — Ambulatory Visit (HOSPITAL_COMMUNITY): Payer: Medicare Other

## 2022-11-27 DIAGNOSIS — J449 Chronic obstructive pulmonary disease, unspecified: Secondary | ICD-10-CM | POA: Insufficient documentation

## 2022-11-27 NOTE — Progress Notes (Signed)
Daily Session Note  Patient Details  Name: Paula [name] MRN: [id number] Date of Birth: Aug 11, 1950 Referring Provider:   Flowsheet Row PULMONARY REHAB OTHER RESP ORIENTATION from 09/24/2022 in [facility] CARDIAC REHABILITATION  Referring Provider Warnell Forester MD       Encounter Date: 11/27/2022  Check In:  Session Check In - 11/27/22 1100       Check-In   Supervising physician immediately available to respond to emergencies See telemetry face sheet for immediately available MD    Location [facility]    Staff Present Ross Ludwig, BS, Exercise Physiologist;Jessica Juanetta Gosling, MA, RCEP, CCRP, CCET    Virtual Visit No    Medication changes reported     No    Fall or balance concerns reported    No    Tobacco Cessation No Change    Warm-up and Cool-down Performed on first and last piece of equipment    Resistance Training Performed Yes    VAD Patient? No    PAD/SET Patient? No      Pain Assessment   Currently in Pain? No/denies    Pain Score 0-No pain    Multiple Pain Sites No             Capillary Blood Glucose: No results found for this or any previous visit (from the past 24 hour(s)).    Social History   Tobacco Use  Smoking Status Former   Current packs/day: 0.75   Average packs/day: 0.8 packs/day for 20.0 years (15.0 ttl pk-yrs)   Types: Cigarettes  Smokeless Tobacco Former   Quit date: 10/31/1998    Goals Met:  Independence with exercise equipment Exercise tolerated well No report of concerns or symptoms today Strength training completed today  Goals Unmet:  Not Applicable  Comments: Pt able to follow exercise prescription today without complaint.  Will continue to monitor for progression.    Dr. Erick Blinks is Medical Director for [facility] Pulmonary Rehab.

## 2022-11-29 ENCOUNTER — Encounter (HOSPITAL_COMMUNITY)
Admission: RE | Admit: 2022-11-29 | Discharge: 2022-11-29 | Disposition: A | Discharge status: Home / Self Care | Payer: Medicare Other | Source: Ambulatory Visit | Attending: Internal Medicine

## 2022-11-29 ENCOUNTER — Ambulatory Visit (HOSPITAL_COMMUNITY): Payer: Medicare Other

## 2022-11-29 ENCOUNTER — Encounter (HOSPITAL_COMMUNITY): Payer: Medicare Other

## 2022-11-29 DIAGNOSIS — J449 Chronic obstructive pulmonary disease, unspecified: Secondary | ICD-10-CM | POA: Diagnosis not present

## 2022-11-29 NOTE — Progress Notes (Signed)
Daily Session Note  Patient Details  Name: Paula [name] MRN: [id number] Date of Birth: October 03, 1950 Referring Provider:   Flowsheet Row PULMONARY REHAB OTHER RESP ORIENTATION from 09/24/2022 in [facility] CARDIAC REHABILITATION  Referring Provider Warnell Forester MD       Encounter Date: 11/29/2022  Check In:  Session Check In - 11/29/22 1045       Check-In   Supervising physician immediately available to respond to emergencies See telemetry face sheet for immediately available ER MD    Location [facility]    Staff Present Rodena Medin, RN, BSN;Jessica [place], MA, RCEP, CCRP, Harolyn Rutherford, RN, BSN    Virtual Visit No    Medication changes reported     No    Fall or balance concerns reported    No    Warm-up and Cool-down Performed on first and last piece of equipment    Resistance Training Performed Yes    VAD Patient? No    PAD/SET Patient? No      Pain Assessment   Currently in Pain? No/denies    Pain Score 0-No pain    Multiple Pain Sites No             Capillary Blood Glucose: No results found for this or any previous visit (from the past 24 hour(s)).    Social History   Tobacco Use  Smoking Status Former   Current packs/day: 0.75   Average packs/day: 0.8 packs/day for 20.0 years (15.0 ttl pk-yrs)   Types: Cigarettes  Smokeless Tobacco Former   Quit date: 10/31/1998    Goals Met:  Proper associated with RPD/PD & O2 Sat Independence with exercise equipment Using PLB without cueing & demonstrates good technique Exercise tolerated well No report of concerns or symptoms today Strength training completed today  Goals Unmet:  Not Applicable  Comments: Pt able to follow exercise prescription today without complaint.  Will continue to monitor for progression.    Dr. Erick Blinks is Medical Director for [facility] Pulmonary Rehab.

## 2022-12-02 ENCOUNTER — Telehealth: Payer: Self-pay

## 2022-12-02 ENCOUNTER — Ambulatory Visit (HOSPITAL_COMMUNITY): Payer: Medicare Other

## 2022-12-02 ENCOUNTER — Encounter (HOSPITAL_COMMUNITY)
Admission: RE | Admit: 2022-12-02 | Discharge: 2022-12-02 | Disposition: A | Discharge status: Home / Self Care | Payer: Medicare Other | Source: Ambulatory Visit | Attending: Internal Medicine

## 2022-12-02 ENCOUNTER — Encounter (HOSPITAL_COMMUNITY): Payer: Medicare Other

## 2022-12-02 DIAGNOSIS — J449 Chronic obstructive pulmonary disease, unspecified: Secondary | ICD-10-CM

## 2022-12-02 NOTE — Telephone Encounter (Signed)
Patient left a voice mail for an appointment.    Has a family his of ovarian cancer in mother. 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  Declined to do genetic-she has no children.    Medical Records  Genetic Testing: no  Annual Physical: in epic  WWE/PAP: no, had hysterectomy over 72 years old   Breast Imaging: In epic  Colonoscopy: In epic

## 2022-12-02 NOTE — Progress Notes (Signed)
Daily Session Note  Patient Details  Name: Paula [name] MRN: [id number] Date of Birth: 1950-06-19 Referring Provider:   Flowsheet Row PULMONARY REHAB OTHER RESP ORIENTATION from 09/24/2022 in [facility] CARDIAC REHABILITATION  Referring Provider Warnell Forester MD       Encounter Date: 12/02/2022  Check In:  Session Check In - 12/02/22 1100       Check-In   Supervising physician immediately available to respond to emergencies See telemetry face sheet for immediately available ER MD    Location [facility]    Staff Present Erskine Speed, RN;Heather Fredric Mare, BS, Exercise Physiologist;Jessica [place], MA, RCEP, CCRP, Dow Adolph, RN, BSN    Virtual Visit No    Medication changes reported     No    Fall or balance concerns reported    No    Tobacco Cessation No Change    Warm-up and Cool-down Performed on first and last piece of equipment    Resistance Training Performed Yes    VAD Patient? No    PAD/SET Patient? No      Pain Assessment   Currently in Pain? No/denies    Pain Score 0-No pain    Multiple Pain Sites No             Capillary Blood Glucose: No results found for this or any previous visit (from the past 24 hour(s)).    Social History   Tobacco Use  Smoking Status Former   Current packs/day: 0.75   Average packs/day: 0.8 packs/day for 20.0 years (15.0 ttl pk-yrs)   Types: Cigarettes  Smokeless Tobacco Former   Quit date: 10/31/1998    Goals Met:  Independence with exercise equipment Exercise tolerated well No report of concerns or symptoms today  Goals Unmet:  Not Applicable  Comments: Pt able to follow exercise prescription today without complaint.  Will continue to monitor for progression.    Dr. Dina Rich is Medical Director for [facility] Cardiac Rehab

## 2022-12-04 ENCOUNTER — Encounter (HOSPITAL_COMMUNITY): Payer: Self-pay | Admitting: *Deleted

## 2022-12-04 ENCOUNTER — Ambulatory Visit (HOSPITAL_COMMUNITY): Payer: Medicare Other

## 2022-12-04 ENCOUNTER — Encounter (HOSPITAL_COMMUNITY): Payer: Medicare Other

## 2022-12-04 ENCOUNTER — Encounter (HOSPITAL_COMMUNITY)
Admission: RE | Admit: 2022-12-04 | Discharge: 2022-12-04 | Disposition: A | Discharge status: Home / Self Care | Payer: Medicare Other | Source: Ambulatory Visit | Attending: Internal Medicine

## 2022-12-04 DIAGNOSIS — J449 Chronic obstructive pulmonary disease, unspecified: Secondary | ICD-10-CM | POA: Diagnosis not present

## 2022-12-04 NOTE — Progress Notes (Signed)
Pulmonary Individual Treatment Plan  Patient Details  Name: Paula [name] MRN: [id number] Date of Birth: 1950-07-01 Referring Provider:   Flowsheet Row PULMONARY REHAB OTHER RESP ORIENTATION from 09/24/2022 in [facility] CARDIAC REHABILITATION  Referring Provider Warnell Forester MD       Initial Encounter Date:  Flowsheet Row PULMONARY REHAB OTHER RESP ORIENTATION from 09/24/2022 in [place] CARDIAC REHABILITATION  Date 09/24/22       Visit Diagnosis: Chronic obstructive pulmonary disease, unspecified COPD type (HCC)  Patient's Home Medications on Admission:   Current Outpatient Medications:    acetaminophen (TYLENOL) 500 MG tablet, Take 500-1,000 mg by mouth every 6 (six) hours as needed (pain.)., Disp: , Rfl:    albuterol (VENTOLIN HFA) 108 (90 Base) MCG/ACT inhaler, Inhale 2 puffs into the lungs every 6 (six) hours as needed for wheezing or shortness of breath., Disp: , Rfl:    azelastine (ASTELIN) 0.1 % nasal spray, Place 2 sprays into both nostrils 2 (two) times daily as needed for allergies. Use in each nostril as directed, Disp: , Rfl:    budesonide-formoterol (SYMBICORT) 160-4.5 MCG/ACT inhaler, Inhale 2 puffs into the lungs 2 (two) times daily., Disp: , Rfl:    Cholecalciferol (VITAMIN D) 125 MCG (5000 UT) CAPS, Take 5,000 Units by mouth in the morning., Disp: , Rfl:    clotrimazole-betamethasone (LOTRISONE) cream, Apply 1 application topically 2 (two) times daily as needed (skin irritation.). , Disp: , Rfl:    diclofenac (VOLTAREN) 75 MG EC tablet, Take 75 mg by mouth daily as needed (arthritis pain)., Disp: , Rfl:    ezetimibe (ZETIA) 10 MG tablet, Take 10 mg by mouth in the morning., Disp: , Rfl:    famotidine (PEPCID) 40 MG tablet, Take 40 mg by mouth at bedtime., Disp: , Rfl:    fluticasone (FLONASE) 50 MCG/ACT nasal spray, Place 2 sprays into both nostrils daily as needed for allergies., Disp: , Rfl:    guaiFENesin (MUCINEX) 600 MG 12 hr tablet, Take 600 mg by mouth  daily as needed (congestion/COPD issues)., Disp: , Rfl:    hydrochlorothiazide (HYDRODIURIL) 25 MG tablet, Take 25 mg by mouth in the morning., Disp: , Rfl:    Ketotifen Fumarate (ALLERGY EYE DROPS OP), Place 1 drop into both eyes 2 (two) times daily as needed (allergy eyes)., Disp: , Rfl:    loratadine (CLARITIN) 10 MG tablet, Take 10 mg by mouth daily as needed for allergies., Disp: , Rfl:    losartan (COZAAR) 100 MG tablet, Take 100 mg by mouth in the morning., Disp: , Rfl:    montelukast (SINGULAIR) 10 MG tablet, Take 10 mg by mouth at bedtime., Disp: , Rfl:    Multiple Vitamins-Minerals (PRESERVISION AREDS 2 PO), Take 1 tablet by mouth in the morning and at bedtime., Disp: , Rfl:    NON FORMULARY, CPAP, O2 at night, Disp: , Rfl:    omeprazole (PRILOSEC) 40 MG capsule, Take 40 mg by mouth daily before breakfast., Disp: , Rfl:    sertraline (ZOLOFT) 100 MG tablet, Take 100 mg by mouth at bedtime., Disp: , Rfl:    terbinafine (LAMISIL) 250 MG tablet, Take 250 mg by mouth in the morning., Disp: , Rfl:   Past Medical History: Past Medical History:  Diagnosis Date   Anxiety    Arthritis    COPD (chronic obstructive pulmonary disease) (HCC)    Heart murmur    Hypertension    Lung nodule    LLL   Sleep apnea  CPAP with O2 2 liters    Tubal ectopic pregnancy 1970   Vitelliform macular dystrophy     Tobacco Use: Social History   Tobacco Use  Smoking Status Former   Current packs/day: 0.75   Average packs/day: 0.8 packs/day for 20.0 years (15.0 ttl pk-yrs)   Types: Cigarettes  Smokeless Tobacco Former   Quit date: 10/31/1998    Labs: Review Flowsheet        No data to display          Capillary Blood Glucose: No results found for: "GLUCAP"   Pulmonary Assessment Scores:  Pulmonary Assessment Scores     Row Name 09/24/22 1454         ADL UCSD   ADL Phase Entry     SOB Score total 64     Rest 1     Walk 3     Stairs 4     Bath 3     Dress 1     Shop 4        CAT Score   CAT Score 18       mMRC Score   mMRC Score 3             UCSD: Self-administered rating of dyspnea associated with activities of daily living (ADLs) 6-point scale (0 = "not at all" to 5 = "maximal or unable to do because of breathlessness")  Scoring Scores range from 0 to 120.  Minimally important difference is 5 units  CAT: CAT can identify the health impairment of COPD patients and is better correlated with disease progression.  CAT has a scoring range of zero to 40. The CAT score is classified into four groups of low (less than 10), medium (10 - 20), high (21-30) and very high (31-40) based on the impact level of disease on health status. A CAT score over 10 suggests significant symptoms.  A worsening CAT score could be explained by an exacerbation, poor medication adherence, poor inhaler technique, or progression of COPD or comorbid conditions.  CAT MCID is 2 points  mMRC: mMRC (Modified Medical Research Council) Dyspnea Scale is used to assess the degree of baseline functional disability in patients of respiratory disease due to dyspnea. No minimal important difference is established. A decrease in score of 1 point or greater is considered a positive change.   Pulmonary Function Assessment:  Pulmonary Function Assessment - 09/24/22 1454       Breath   Shortness of Breath Yes;Fear of Shortness of Breath;Limiting activity             Exercise Target Goals: Exercise Program Goal: Individual exercise prescription set using results from initial 6 min walk test and THRR while considering  patient's activity barriers and safety.   Exercise Prescription Goal: Initial exercise prescription builds to 30-45 minutes a day of aerobic activity, 2-3 days per week.  Home exercise guidelines will be given to patient during program as part of exercise prescription that the participant will acknowledge.  Activity Barriers & Risk Stratification:  Activity Barriers &  Cardiac Risk Stratification - 09/24/22 1250       Activity Barriers & Cardiac Risk Stratification   Activity Barriers Arthritis;Deconditioning;Muscular Weakness;Shortness of Breath;Joint Problems;Balance Concerns   currently in PT for foot            6 Minute Walk:  6 Minute Walk     Row Name 09/24/22 1428         6 Minute Walk  Phase Initial     Distance 675 feet     Walk Time 4.4 minutes     # of Rest Breaks 2  46 sec, 38 sec     MPH 1.74     METS 1.01     RPE 13     Perceived Dyspnea  16     VO2 Peak 3.53     Symptoms Yes (comment)     Comments hip pain 7/10     Resting HR 78 bpm     Resting BP 128/70     Resting Oxygen Saturation  96 %     Exercise Oxygen Saturation  during 6 min walk 88 %     Max Ex. HR 116 bpm     Max Ex. BP 204/112     2 Minute Post BP 144/72       Interval HR   1 Minute HR 108     2 Minute HR 100  seated     3 Minute HR 101     4 Minute HR 116     5 Minute HR 107     6 Minute HR 109     2 Minute Post HR 71     Interval Heart Rate? Yes       Interval Oxygen   Interval Oxygen? Yes     Baseline Oxygen Saturation % 96 %     1 Minute Oxygen Saturation % 90 %     1 Minute Liters of Oxygen 0 L  Room Air     2 Minute Oxygen Saturation % 90 %  seated     2 Minute Liters of Oxygen 0 L     3 Minute Oxygen Saturation % 88 %     3 Minute Liters of Oxygen 0 L     4 Minute Oxygen Saturation % 89 %     4 Minute Liters of Oxygen 0 L     5 Minute Oxygen Saturation % 88 %     5 Minute Liters of Oxygen 0 L     6 Minute Oxygen Saturation % 89 %     6 Minute Liters of Oxygen 0 L     2 Minute Post Oxygen Saturation % 95 %     2 Minute Post Liters of Oxygen 0 L              Oxygen Initial Assessment:  Oxygen Initial Assessment - 09/24/22 1249       Home Oxygen   Home Oxygen Device Home Concentrator    Sleep Oxygen Prescription Continuous;CPAP    Liters per minute 2    Home Exercise Oxygen Prescription None    Home Resting Oxygen  Prescription None    Compliance with Home Oxygen Use Yes      Initial 6 min Walk   Oxygen Used None      Program Oxygen Prescription   Program Oxygen Prescription None      Intervention   Short Term Goals To learn and exhibit compliance with exercise, home and travel O2 prescription;To learn and understand importance of monitoring SPO2 with pulse oximeter and demonstrate accurate use of the pulse oximeter.;To learn and understand importance of maintaining oxygen saturations>88%;To learn and demonstrate proper pursed lip breathing techniques or other breathing techniques. ;To learn and demonstrate proper use of respiratory medications    Long  Term Goals Exhibits compliance with exercise, home  and travel O2 prescription;Verbalizes importance of monitoring SPO2 with pulse  oximeter and return demonstration;Maintenance of O2 saturations>88%;Exhibits proper breathing techniques, such as pursed lip breathing or other method taught during program session;Compliance with respiratory medication;Demonstrates proper use of MDI's             Oxygen Re-Evaluation:  Oxygen Re-Evaluation     Row Name 09/24/22 1446 10/02/22 1117 10/07/22 1341 10/14/22 1121 10/23/22 1210     Program Oxygen Prescription   Program Oxygen Prescription None None None None --     Home Oxygen   Home Oxygen Device Home Concentrator None None None --   Sleep Oxygen Prescription Continuous;CPAP Continuous;CPAP Continuous;CPAP Continuous;CPAP --   Liters per minute 2 -- 2 2 --   Home Exercise Oxygen Prescription None None None None --   Home Resting Oxygen Prescription None None None None --   Compliance with Home Oxygen Use Yes Yes Yes Yes --     Goals/Expected Outcomes   Short Term Goals To learn and demonstrate proper pursed lip breathing techniques or other breathing techniques. ;To learn and exhibit compliance with exercise, home and travel O2 prescription;To learn and understand importance of monitoring SPO2 with  pulse oximeter and demonstrate accurate use of the pulse oximeter.;To learn and understand importance of maintaining oxygen saturations>88%;To learn and demonstrate proper use of respiratory medications To learn and demonstrate proper pursed lip breathing techniques or other breathing techniques. ;To learn and exhibit compliance with exercise, home and travel O2 prescription;To learn and understand importance of monitoring SPO2 with pulse oximeter and demonstrate accurate use of the pulse oximeter.;To learn and understand importance of maintaining oxygen saturations>88%;To learn and demonstrate proper use of respiratory medications To learn and demonstrate proper pursed lip breathing techniques or other breathing techniques. ;To learn and exhibit compliance with exercise, home and travel O2 prescription;To learn and understand importance of monitoring SPO2 with pulse oximeter and demonstrate accurate use of the pulse oximeter.;To learn and understand importance of maintaining oxygen saturations>88%;To learn and demonstrate proper use of respiratory medications To learn and demonstrate proper pursed lip breathing techniques or other breathing techniques. ;To learn and exhibit compliance with exercise, home and travel O2 prescription;To learn and understand importance of monitoring SPO2 with pulse oximeter and demonstrate accurate use of the pulse oximeter.;To learn and understand importance of maintaining oxygen saturations>88%;To learn and demonstrate proper use of respiratory medications To learn and demonstrate proper pursed lip breathing techniques or other breathing techniques.    Long  Term Goals Exhibits proper breathing techniques, such as pursed lip breathing or other method taught during program session;Exhibits compliance with exercise, home  and travel O2 prescription;Verbalizes importance of monitoring SPO2 with pulse oximeter and return demonstration;Maintenance of O2 saturations>88%;Compliance with  respiratory medication;Demonstrates proper use of MDI's Exhibits proper breathing techniques, such as pursed lip breathing or other method taught during program session;Exhibits compliance with exercise, home  and travel O2 prescription;Verbalizes importance of monitoring SPO2 with pulse oximeter and return demonstration;Maintenance of O2 saturations>88%;Compliance with respiratory medication;Demonstrates proper use of MDI's Exhibits proper breathing techniques, such as pursed lip breathing or other method taught during program session;Exhibits compliance with exercise, home  and travel O2 prescription;Verbalizes importance of monitoring SPO2 with pulse oximeter and return demonstration;Maintenance of O2 saturations>88%;Compliance with respiratory medication;Demonstrates proper use of MDI's Exhibits proper breathing techniques, such as pursed lip breathing or other method taught during program session;Exhibits compliance with exercise, home  and travel O2 prescription;Verbalizes importance of monitoring SPO2 with pulse oximeter and return demonstration;Maintenance of O2 saturations>88%;Compliance with respiratory medication;Demonstrates proper use of MDI's Exhibits  proper breathing techniques, such as pursed lip breathing or other method taught during program session   Comments Reviewed PLB technique with pt.  Talked about how it works and it's importance in maintaining their exercise saturations. Talked to patient about triggers today.  She is also good about using her PLB to help with breath control.  She is compliant with her inhalers.  She will use her abuterol inhaler on bad breathing days.  She does not have a spacer, so we talked about trying to get her one to use at home. Talked to patient about triggers today.  She is also good about using her PLB to help with breath control.  She is compliant with her inhalers.  She will use her abuterol inhaler on bad breathing days.  She does not have a spacer, so we  talked about trying to get her one to use at home. Paula [name] is doing well in rehab. She is compliant with her CPAP and it helps her sleep through the night with it.  She feels good with it.  She continues to work on using her PLB to help with SOB.  She has been using her inhaler more during hot season as the heat irritates her breathing more. She now has a spacer and we reviewed how to use it. Diaphragmatic and PLB breathing explained and performed with patient. Patient has a better understanding of how to do these exercises to help with breathing performance and relaxation. Patient performed breathing techniques adequately and to practice further at home.   Goals/Expected Outcomes Short: Become more profiecient at using PLB.   Long: Become independent at using PLB. Short: Get spacer to use with inhalers  Long; Continuet use PLB and stay compliant with inhaler Short: Get spacer to use with inhalers  Long; Continuet use PLB and stay compliant with inhaler Short: Continue to work on using PLB routinely Long: Conitnued compliance Short: practice PLB and diaphragmatic breathing at home. Long: Use PLB and diaphragmatic breathing independently    Row Name 11/18/22 1132             Program Oxygen Prescription   Program Oxygen Prescription None         Home Oxygen   Home Oxygen Device None       Sleep Oxygen Prescription Continuous;CPAP       Liters per minute 2       Home Exercise Oxygen Prescription None       Home Resting Oxygen Prescription None       Compliance with Home Oxygen Use Yes         Goals/Expected Outcomes   Short Term Goals To learn and demonstrate proper pursed lip breathing techniques or other breathing techniques. ;To learn and exhibit compliance with exercise, home and travel O2 prescription;To learn and understand importance of monitoring SPO2 with pulse oximeter and demonstrate accurate use of the pulse oximeter.;To learn and understand importance of maintaining oxygen  saturations>88%       Long  Term Goals Exhibits compliance with exercise, home  and travel O2 prescription;Verbalizes importance of monitoring SPO2 with pulse oximeter and return demonstration;Maintenance of O2 saturations>88%;Exhibits proper breathing techniques, such as pursed lip breathing or other method taught during program session;Compliance with respiratory medication       Comments Paula [name] is doing well in rehab.  She continues to stay compliant with CPAP and oxygen at night. She uses her PLB during exericse and when SOB.  Her saturations are doing well overall.  She still gets SOB, but feels like she can manage it better now.       Goals/Expected Outcomes ShortL Continue to work on PLB Long: Continue to stay compliant                Oxygen Discharge (Final Oxygen Re-Evaluation):  Oxygen Re-Evaluation - 11/18/22 1132       Program Oxygen Prescription   Program Oxygen Prescription None      Home Oxygen   Home Oxygen Device None    Sleep Oxygen Prescription Continuous;CPAP    Liters per minute 2    Home Exercise Oxygen Prescription None    Home Resting Oxygen Prescription None    Compliance with Home Oxygen Use Yes      Goals/Expected Outcomes   Short Term Goals To learn and demonstrate proper pursed lip breathing techniques or other breathing techniques. ;To learn and exhibit compliance with exercise, home and travel O2 prescription;To learn and understand importance of monitoring SPO2 with pulse oximeter and demonstrate accurate use of the pulse oximeter.;To learn and understand importance of maintaining oxygen saturations>88%    Long  Term Goals Exhibits compliance with exercise, home  and travel O2 prescription;Verbalizes importance of monitoring SPO2 with pulse oximeter and return demonstration;Maintenance of O2 saturations>88%;Exhibits proper breathing techniques, such as pursed lip breathing or other method taught during program session;Compliance with respiratory medication     Comments Paula [name] is doing well in rehab.  She continues to stay compliant with CPAP and oxygen at night. She uses her PLB during exericse and when SOB.  Her saturations are doing well overall.  She still gets SOB, but feels like she can manage it better now.    Goals/Expected Outcomes ShortL Continue to work on PLB Long: Continue to stay compliant             Initial Exercise Prescription:  Initial Exercise Prescription - 09/24/22 1400       Date of Initial Exercise RX and Referring Provider   Date 09/24/22    Referring Provider Warnell Forester MD      Oxygen   Maintain Oxygen Saturation 88% or higher      Treadmill   MPH 1.5    Grade 0.5    Minutes 17    METs 2.25      NuStep   Level 1    SPM 80    Minutes 17    METs 1.5      REL-XR   Level 1    Watts 20    Speed 50    Minutes 15    METs 1.5      Prescription Details   Frequency (times per week) 3    Duration Progress to 30 minutes of continuous aerobic without signs/symptoms of physical distress      Intensity   THRR 40-80% of Max Heartrate 106-133    Ratings of Perceived Exertion 11-13    Perceived Dyspnea 0-4      Progression   Progression Continue to progress workloads to maintain intensity without signs/symptoms of physical distress.      Resistance Training   Training Prescription Yes    Weight 3 lb    Reps 10-15             Perform Capillary Blood Glucose checks as needed.  Exercise Prescription Changes:   Exercise Prescription Changes     Row Name 09/24/22 1400 10/07/22 1300 10/14/22 1100 10/21/22 1300 11/06/22 1200     Response to Exercise  Blood Pressure (Admit) 128/70 140/62 -- 116/72 130/80   Blood Pressure (Exercise) 204/112 158/82 -- 112/70 --   Blood Pressure (Exit) 134/74 162/84 -- 112/64 124/76   Heart Rate (Admit) 78 bpm 87 bpm -- 71 bpm 63 bpm   Heart Rate (Exercise) 116 bpm 87 bpm -- 88 bpm 84 bpm   Heart Rate (Exit) 87 bpm 71 bpm -- 71 bpm 68 bpm   Oxygen  Saturation (Admit) 96 % 92 % -- 97 % 94 %   Oxygen Saturation (Exercise) 88 % 93 % -- 93 % 91 %   Oxygen Saturation (Exit) 91 % 96 % -- 96 % 95 %   Rating of Perceived Exertion (Exercise) 13 13 -- 12 13   Perceived Dyspnea (Exercise) 16 1 -- 1 3   Symptoms hip pain 7/10 -- -- -- --   Comments walk test orientation -- -- -- --   Duration -- Continue with 30 min of aerobic exercise without signs/symptoms of physical distress. -- Continue with 30 min of aerobic exercise without signs/symptoms of physical distress. Continue with 30 min of aerobic exercise without signs/symptoms of physical distress.   Intensity -- THRR unchanged -- THRR unchanged THRR unchanged     Progression   Progression -- Continue to progress workloads to maintain intensity without signs/symptoms of physical distress. -- Continue to progress workloads to maintain intensity without signs/symptoms of physical distress. Continue to progress workloads to maintain intensity without signs/symptoms of physical distress.     Resistance Training   Training Prescription -- Yes -- Yes Yes   Weight -- 3 -- 3 3   Reps -- 10-15 -- 10-15 10-15     NuStep   Level -- 1 -- 2 3   SPM -- 110 -- 116 99   Minutes -- 15 -- 15 15   METs -- 2 -- 2 2     REL-XR   Level -- 1 -- 2 3   Speed -- 48 -- 50 53   Minutes -- 15 -- 15 15   METs -- 1.5 -- 1.8 2.3     Home Exercise Plan   Plans to continue exercise at -- -- Home (comment)  walking, weights, videos -- --   Frequency -- -- Add 1 additional day to program exercise sessions. -- --   Initial Home Exercises Provided -- -- 10/14/22 -- --     Oxygen   Maintain Oxygen Saturation -- 88% or higher -- 88% or higher 88% or higher    Row Name 11/18/22 1100 12/02/22 1300           Response to Exercise   Blood Pressure (Admit) -- 129/60      Blood Pressure (Exit) -- 128/68      Heart Rate (Admit) -- 70 bpm      Heart Rate (Exercise) -- 89 bpm      Heart Rate (Exit) -- 71 bpm      Oxygen  Saturation (Admit) -- 95 %      Oxygen Saturation (Exercise) -- 93 %      Oxygen Saturation (Exit) -- 95 %      Rating of Perceived Exertion (Exercise) -- 12      Perceived Dyspnea (Exercise) -- 2      Duration -- Continue with 30 min of aerobic exercise without signs/symptoms of physical distress.      Intensity -- THRR unchanged        Progression   Progression -- Continue to progress  workloads to maintain intensity without signs/symptoms of physical distress.        Resistance Training   Training Prescription -- Yes      Weight -- 3      Reps -- 10-15        NuStep   Level -- 4      SPM -- 106      Minutes -- 15      METs -- 2.1        REL-XR   Level -- 3      Speed -- 52      Minutes -- 15      METs -- 2.1        Home Exercise Plan   Plans to continue exercise at Home (comment)  walking, weights, videos Home (comment)      Frequency Add 1 additional day to program exercise sessions. Add 2 additional days to program exercise sessions.      Initial Home Exercises Provided 10/14/22 --        Oxygen   Maintain Oxygen Saturation -- 88% or higher               Exercise Comments:   Exercise Goals and Review:   Exercise Goals     Row Name 09/24/22 1445 10/07/22 1334           Exercise Goals   Increase Physical Activity Yes Yes      Intervention Provide advice, education, support and counseling about physical activity/exercise needs.;Develop an individualized exercise prescription for aerobic and resistive training based on initial evaluation findings, risk stratification, comorbidities and participant's personal goals. Provide advice, education, support and counseling about physical activity/exercise needs.;Develop an individualized exercise prescription for aerobic and resistive training based on initial evaluation findings, risk stratification, comorbidities and participant's personal goals.      Expected Outcomes Short Term: Attend rehab on a regular basis to  increase amount of physical activity.;Long Term: Add in home exercise to make exercise part of routine and to increase amount of physical activity.;Long Term: Exercising regularly at least 3-5 days a week. Short Term: Attend rehab on a regular basis to increase amount of physical activity.;Long Term: Add in home exercise to make exercise part of routine and to increase amount of physical activity.;Long Term: Exercising regularly at least 3-5 days a week.      Increase Strength and Stamina Yes Yes      Intervention Provide advice, education, support and counseling about physical activity/exercise needs.;Develop an individualized exercise prescription for aerobic and resistive training based on initial evaluation findings, risk stratification, comorbidities and participant's personal goals. Provide advice, education, support and counseling about physical activity/exercise needs.;Develop an individualized exercise prescription for aerobic and resistive training based on initial evaluation findings, risk stratification, comorbidities and participant's personal goals.      Expected Outcomes Short Term: Increase workloads from initial exercise prescription for resistance, speed, and METs.;Short Term: Perform resistance training exercises routinely during rehab and add in resistance training at home;Long Term: Improve cardiorespiratory fitness, muscular endurance and strength as measured by increased METs and functional capacity ( ) Short Term: Increase workloads from initial exercise prescription for resistance, speed, and METs.;Short Term: Perform resistance training exercises routinely during rehab and add in resistance training at home;Long Term: Improve cardiorespiratory fitness, muscular endurance and strength as measured by increased METs and functional capacity ( )      Able to understand and use rate of perceived exertion (RPE) scale Yes Yes  Intervention Provide education and explanation on how to  use RPE scale Provide education and explanation on how to use RPE scale      Expected Outcomes Short Term: Able to use RPE daily in rehab to express subjective intensity level;Long Term:  Able to use RPE to guide intensity level when exercising independently Short Term: Able to use RPE daily in rehab to express subjective intensity level;Long Term:  Able to use RPE to guide intensity level when exercising independently      Able to understand and use Dyspnea scale Yes Yes      Intervention Provide education and explanation on how to use Dyspnea scale Provide education and explanation on how to use Dyspnea scale      Expected Outcomes Short Term: Able to use Dyspnea scale daily in rehab to express subjective sense of shortness of breath during exertion;Long Term: Able to use Dyspnea scale to guide intensity level when exercising independently Short Term: Able to use Dyspnea scale daily in rehab to express subjective sense of shortness of breath during exertion;Long Term: Able to use Dyspnea scale to guide intensity level when exercising independently      Knowledge and understanding of Target Heart Rate Range (THRR) Yes Yes      Intervention Provide education and explanation of THRR including how the numbers were predicted and where they are located for reference Provide education and explanation of THRR including how the numbers were predicted and where they are located for reference      Expected Outcomes Short Term: Able to state/look up THRR;Short Term: Able to use daily as guideline for intensity in rehab;Long Term: Able to use THRR to govern intensity when exercising independently Short Term: Able to state/look up THRR;Short Term: Able to use daily as guideline for intensity in rehab;Long Term: Able to use THRR to govern intensity when exercising independently      Able to check pulse independently Yes Yes      Intervention Provide education and demonstration on how to check pulse in carotid and radial  arteries.;Review the importance of being able to check your own pulse for safety during independent exercise Provide education and demonstration on how to check pulse in carotid and radial arteries.;Review the importance of being able to check your own pulse for safety during independent exercise      Expected Outcomes Short Term: Able to explain why pulse checking is important during independent exercise;Long Term: Able to check pulse independently and accurately Short Term: Able to explain why pulse checking is important during independent exercise;Long Term: Able to check pulse independently and accurately      Understanding of Exercise Prescription Yes Yes      Intervention Provide education, explanation, and written materials on patient's individual exercise prescription Provide education, explanation, and written materials on patient's individual exercise prescription      Expected Outcomes Short Term: Able to explain program exercise prescription;Long Term: Able to explain home exercise prescription to exercise independently Short Term: Able to explain program exercise prescription;Long Term: Able to explain home exercise prescription to exercise independently               Exercise Goals Re-Evaluation :  Exercise Goals Re-Evaluation     Row Name 09/24/22 1445 10/07/22 1334 10/14/22 1134 10/21/22 1346 11/07/22 0913     Exercise Goal Re-Evaluation   Exercise Goals Review Able to understand and use rate of perceived exertion (RPE) scale;Able to understand and use Dyspnea scale Increase Physical Activity;Increase Strength  and Stamina;Able to understand and use rate of perceived exertion (RPE) scale;Able to understand and use Dyspnea scale;Knowledge and understanding of Target Heart Rate Range (THRR);Able to check pulse independently;Understanding of Exercise Prescription Increase Physical Activity;Increase Strength and Stamina;Able to understand and use rate of perceived exertion (RPE)  scale;Able to understand and use Dyspnea scale;Knowledge and understanding of Target Heart Rate Range (THRR);Able to check pulse independently;Understanding of Exercise Prescription -- Increase Physical Activity;Understanding of Exercise Prescription;Increase Strength and Stamina   Comments Reviewed RPE  and dyspnea scale and program prescription with pt today.  Pt voiced understanding and was given a copy of goals to take home. Pt has comepleted 4 sessions of pulmonary rehab. She is motivated to be back in the program after having foot surgery. She is progressing slowly. She is currently exercising a t2.0 METs on the stepper. Will continue to monitor and progress as able. Reviewed home exercise with pt today.  Pt plans to walk with dog and use weights at home for exercise.  We also talked about trying out staff videos.  She is also going to PT once a week.  Reviewed THR, pulse, RPE, sign and symptoms, pulse oximetery and when to call 911 or MD.  Also discussed weather considerations and indoor options.  Pt voiced understanding.  She is feeling better since starting rehab. She wants to continue to work on weight loss Patient is enjoying rehab. She has increased her SPM and METs on the stepper. Paula [name] has increased her levels on both the NuStep and XR to level 3. She is also working at an RPE of 13 for both. Will continue to monitor and progress asable .   Expected Outcomes Short: Use RPE daily to regulate intensity.  Long: Follow program prescription Through exercise at rehab and home, patient will achieve their goals. Short: Start to add in home exercise Long: Continue to build strength and stamina -- Short term goal: continue at workloads until RPE is an 104 then increase levels    Long term goal: continue to attend pulmonary rehab    Row Name 11/18/22 1125             Exercise Goal Re-Evaluation   Exercise Goals Review Increase Physical Activity;Increase Strength and Stamina;Understanding of Exercise  Prescription       Comments Paula [name] is doing well in rehab. She missed a couple last week.  She is doing her home exercise and walking more. She has noticed that her strength and stamina are improving.  She does note that she is working hard in class as she is sore the next day.       Expected Outcomes Short: Continue to add in more exercise at home Long: Continue to improve stamina                Discharge Exercise Prescription (Final Exercise Prescription Changes):  Exercise Prescription Changes - 12/02/22 1300       Response to Exercise   Blood Pressure (Admit) 129/60    Blood Pressure (Exit) 128/68    Heart Rate (Admit) 70 bpm    Heart Rate (Exercise) 89 bpm    Heart Rate (Exit) 71 bpm    Oxygen Saturation (Admit) 95 %    Oxygen Saturation (Exercise) 93 %    Oxygen Saturation (Exit) 95 %    Rating of Perceived Exertion (Exercise) 12    Perceived Dyspnea (Exercise) 2    Duration Continue with 30 min of aerobic exercise without signs/symptoms of physical distress.  Intensity THRR unchanged      Progression   Progression Continue to progress workloads to maintain intensity without signs/symptoms of physical distress.      Resistance Training   Training Prescription Yes    Weight 3    Reps 10-15      NuStep   Level 4    SPM 106    Minutes 15    METs 2.1      REL-XR   Level 3    Speed 52    Minutes 15    METs 2.1      Home Exercise Plan   Plans to continue exercise at Home (comment)    Frequency Add 2 additional days to program exercise sessions.      Oxygen   Maintain Oxygen Saturation 88% or higher             Nutrition:  Target Goals: Understanding of nutrition guidelines, daily intake of sodium 1500mg , cholesterol 200mg , calories 30% from fat and 7% or less from saturated fats, daily to have 5 or more servings of fruits and vegetables.  Biometrics:    Nutrition Therapy Plan and Nutrition Goals:  Nutrition Therapy & Goals - 09/30/22 1233        Nutrition Therapy   RD appointment deferred Yes      Personal Nutrition Goals   Comments We offer  educational sessions on heart healthy nutrition with handouts.      Intervention Plan   Intervention Nutrition handout(s) given to patient.    Expected Outcomes Short Term Goal: Understand basic principles of dietary content, such as calories, fat, sodium, cholesterol and nutrients.             Nutrition Assessments:  Nutrition Assessments - 09/24/22 1447       MEDFICTS Scores   Pre Score 50            MEDIFICTS Score Key: >=70 Need to make dietary changes  40-70 Heart Healthy Diet <= 40 Therapeutic Level Cholesterol Diet   Picture Your Plate Scores: <40 Unhealthy dietary pattern with much room for improvement. 41-50 Dietary pattern unlikely to meet recommendations for good health and room for improvement. 51-60 More healthful dietary pattern, with some room for improvement.  >60 Healthy dietary pattern, although there may be some specific behaviors that could be improved.    Nutrition Goals Re-Evaluation:  Nutrition Goals Re-Evaluation     Row Name 10/14/22 1117 11/18/22 1130           Goals   Nutrition Goal Focus on listening to hunger cues Short: Focus on hunger cues and use water Long: Continue to improve diet      Comment Paula [name] has noticed that she will eat and then feel hungry shortly afterwards.  We talked about listening to her hunger cues, as she used to ignore them. She is now using water to help.  She is also working on focusing on healthy eating with more fruit and vegetables.  She is also working on portion control. Leafy is doing well in rehab.  She continues to work on her diet.  Her husband is not cooking healthy, but she is, so they are working on getting back to cooking healthy again. She is sitll working on listening to hunger cues and portion control.      Expected Outcome Short: Focus on hunger cues and use water Long: Continue to improve diet  Short: COntinue to work on hunger cues Long: COnitnue to work on portion control  Nutrition Goals Discharge (Final Nutrition Goals Re-Evaluation):  Nutrition Goals Re-Evaluation - 11/18/22 1130       Goals   Nutrition Goal Short: Focus on hunger cues and use water Long: Continue to improve diet    Comment Paula [name] is doing well in rehab.  She continues to work on her diet.  Her husband is not cooking healthy, but she is, so they are working on getting back to cooking healthy again. She is sitll working on listening to hunger cues and portion control.    Expected Outcome Short: COntinue to work on hunger cues Long: COnitnue to work on portion control             Psychosocial: Target Goals: Acknowledge presence or absence of significant depression and/or stress, maximize coping skills, provide positive support system. Participant is able to verbalize types and ability to use techniques and skills needed for reducing stress and depression.  Initial Review & Psychosocial Screening:  Initial Psych Review & Screening - 09/24/22 1251       Initial Review   Current issues with Current Psychotropic Meds;Current Depression;Current Sleep Concerns;History of Depression      Family Dynamics   Good Support System? Yes   husband (doesn't help with diet), kids     Barriers   Psychosocial barriers to participate in program The patient should benefit from training in stress management and relaxation.;Psychosocial barriers identified (see note)      Screening Interventions   Interventions Encouraged to exercise;Provide feedback about the scores to participant;To provide support and resources with identified psychosocial needs    Expected Outcomes Short Term goal: Identification and review with participant of any Quality of Life or Depression concerns found by scoring the questionnaire.;Long Term Goal: Stressors or current issues are controlled or eliminated.;Long Term goal: The  participant improves quality of Life and PHQ9 Scores as seen by post scores and/or verbalization of changes;Short Term goal: Utilizing psychosocial counselor, staff and physician to assist with identification of specific Stressors or current issues interfering with healing process. Setting desired goal for each stressor or current issue identified.             Quality of Life Scores:  Quality of Life - 09/24/22 1503       Quality of Life   Select Quality of Life      Quality of Life Scores   Health/Function Pre 13.41 %    Socioeconomic Pre 21.08 %    Psych/Spiritual Pre 17.43 %    Family Pre 24 %    GLOBAL Pre 16.94 %            Scores of 19 and below usually indicate a poorer quality of life in these areas.  A difference of  2-3 points is a clinically meaningful difference.  A difference of 2-3 points in the total score of the Quality of Life Index has been associated with significant improvement in overall quality of life, self-image, physical symptoms, and general health in studies assessing change in quality of life.   PHQ-9: Review Flowsheet  More data may exist      11/11/2022 10/14/2022 09/24/2022 04/30/2022 12/06/2021  Depression screen PHQ 2/9  Decreased Interest 1 1 1 1 2   Down, Depressed, Hopeless 1 0 1 1 0  PHQ - 2 Score 2 1 2 2 2   Altered sleeping 2 0 0 1 1  Tired, decreased energy 3 2 2 2 2   Change in appetite 0 1 2 2 2   Feeling bad  or failure about yourself  1 1 0 1 0  Trouble concentrating 0 0 0 0 0  Moving slowly or fidgety/restless 0 0 1 0 0  Suicidal thoughts 0 0 0 0 0  PHQ-9 Score 8 5 7 8 7   Difficult doing work/chores Somewhat difficult Somewhat difficult Very difficult Not difficult at all Somewhat difficult    Details           Interpretation of Total Score  Total Score Depression Severity:  1-4 = Minimal depression, 5-9 = Mild depression, 10-14 = Moderate depression, 15-19 = Moderately severe depression, 20-27 = Severe depression    Psychosocial Evaluation and Intervention:  Psychosocial Evaluation - 09/24/22 1430       Psychosocial Evaluation & Interventions   Interventions Stress management education;Encouraged to exercise with the program and follow exercise prescription    Comments Paula [name] is coming into pulmonary rehab for COPD.  She was not able to finish program last time due to a foot surgery but knew it made a big difference for her.  She is eager to return and get moving again.  She does not have any barriers to getting to rehab other than multiple doctor's appointments which we discussed making up those up at end.  She lives with her husband who is the primary cook, which causes her stress.  Her husband and cousins are her support system.  She is a retired Archivist.  Antigone has a history of depression and is currently on meds but feels well managed at this time.  Her PHQ indicated mil depression (6) but she feels it comes from not being able to do everything she wants to currently.  She enjoys working in her garden, but not able to do as much curerntly and would like to be to get back in the garden.  She really wants to be able to breathe better and increase her stamina in rehab.  She would really like to work on weight loss as part of being able to move around better.  Her biggest goal is to be able to dance at her niece's wedding in October!  She wants to enjoy and have fun.    Expected Outcomes Short: Attend rehab to start to build stamina and improve breathing Long: Continue to find coping skills to help manage depression and continue to build stamina    Continue Psychosocial Services  Follow up required by staff             Psychosocial Re-Evaluation:  Psychosocial Re-Evaluation     Row Name 09/30/22 1234 10/14/22 1114 11/11/22 1132 11/20/22 1110       Psychosocial Re-Evaluation   Current issues with Current Depression;Current Sleep Concerns;Current Psychotropic Meds Current Depression;Current Sleep  Concerns;Current Psychotropic Meds Current Depression;Current Sleep Concerns;Current Psychotropic Meds Current Depression;Current Sleep Concerns;Current Psychotropic Meds    Comments Patient is new to the program. She has completed 2 sessions. She continues to have no psychosocial barriers identified. Her depressions continues to be managed with Sertraline. She has participated in the program before and enjoys the sessions and demonstrates an interest in improving her health. Her personal goals for the program breathe better; increase her stamina; lose weight; and be able to dance at her neice's wedding on October. We will continue to monitor her progress. Paula [name] is doing well in rehab.  She is feeling better overall.  Her PHQ has improved from 7 down to 5.  We will continue to monitor her PHQ.  She still struggles with  sleep and will feel bad about herself some times, but that is improving.  She feels that her mood is stable and her Zoloft is working for her. We talked about the exercise helping as well. Reviewed patient health questionnaire (PHQ-9) with patient for follow up. Previously, patients score indicated signs/symptoms of depression.  Reviewed to see if patient is improving symptom wise while in program.  Score declined and patient states that it is because they have not been sleeping well again.  She does not nap and goes through periods when she has times of good sleep and some of bad, so she is currently in her rougher pattern.  This leaves her feeling tired and worn up.  She also gets frustrated that she is not further along and not able to go and do everything she normally would have in the past. Pt is taking Sertraline as prescribed to help with depression.  Pt states that her sleeping has improved recently;however, she is still frustrated that her energy level is not what it was before she had surgery.    Expected Outcomes Pt's depression and sleep will continue to be managed, and she will have no  other identifiable psychosocial issues. Short: Continue to exercise for mental boost Long: continue to be compliant with meds. short: Continue to work on sleep Long: Continue to stay postive and exercise for mental boost. Short term:  Continue to get enough sleep so that your energy is improved to walk more       Long term:  Continue to attend PR to increase her strength/stamina    Interventions Stress management education;Relaxation education Encouraged to attend Pulmonary Rehabilitation for the exercise;Stress management education Encouraged to attend Pulmonary Rehabilitation for the exercise;Stress management education Stress management education;Relaxation education;Encouraged to attend Pulmonary Rehabilitation for the exercise    Continue Psychosocial Services  No Follow up required Follow up required by staff Follow up required by staff Follow up required by staff             Psychosocial Discharge (Final Psychosocial Re-Evaluation):  Psychosocial Re-Evaluation - 11/20/22 1110       Psychosocial Re-Evaluation   Current issues with Current Depression;Current Sleep Concerns;Current Psychotropic Meds    Comments Pt is taking Sertraline as prescribed to help with depression.  Pt states that her sleeping has improved recently;however, she is still frustrated that her energy level is not what it was before she had surgery.    Expected Outcomes Short term:  Continue to get enough sleep so that your energy is improved to walk more       Long term:  Continue to attend PR to increase her strength/stamina    Interventions Stress management education;Relaxation education;Encouraged to attend Pulmonary Rehabilitation for the exercise    Continue Psychosocial Services  Follow up required by staff              Education: Education Goals: Education classes will be provided on a weekly basis, covering required topics. Participant will state understanding/return demonstration of topics  presented.  Learning Barriers/Preferences:  Learning Barriers/Preferences - 09/24/22 1248       Learning Barriers/Preferences   Learning Barriers Sight   glasses   Learning Preferences Written Material;Skilled Demonstration             Education Topics: How Lungs Work and Diseases: - Discuss the anatomy of the lungs and diseases that can affect the lungs, such as COPD. Flowsheet Row PULMONARY REHAB OTHER RESPIRATORY from 04/25/2022 in [facility]  Date 12/27/21  Educator DF  Instruction Review Code 1- Verbalizes Understanding       Exercise: -Discuss the importance of exercise, FITT principles of exercise, normal and abnormal responses to exercise, and how to exercise safely.   Environmental Irritants: -Discuss types of environmental irritants and how to limit exposure to environmental irritants. Flowsheet Row PULMONARY REHAB OTHER RESPIRATORY from 11/27/2022 in [place] [facility]  Date 10/02/22  Educator [facility]  Instruction Review Code 1- Verbalizes Understanding       Meds/Inhalers and oxygen: - Discuss respiratory medications, definition of an inhaler and oxygen, and the proper way to use an inhaler and oxygen. Flowsheet Row PULMONARY REHAB OTHER RESPIRATORY from 11/27/2022 in [place] [facility]  Date 10/09/22  Educator [facility]       Energy Saving Techniques: - Discuss methods to conserve energy and decrease shortness of breath when performing activities of daily living.  Flowsheet Row PULMONARY REHAB OTHER RESPIRATORY from 11/27/2022 in [place] [facility]  Date 10/16/22  Educator Handout  Instruction Review Code 1- Verbalizes Understanding       Bronchial Hygiene / Breathing Techniques: - Discuss breathing mechanics, pursed-lip breathing technique,  proper posture, effective ways to clear airways, and other functional breathing techniques Flowsheet Row PULMONARY REHAB OTHER RESPIRATORY from 11/27/2022 in  [place] [facility]  Date 10/23/22  Educator [facility]  Instruction Review Code 1- Personnel officer: - Provides group verbal and written instruction about the health risks of elevated stress, cause of high stress, and healthy ways to reduce stress.   Nutrition I: Fats: - Discuss the types of cholesterol, what cholesterol does to the body, and how cholesterol levels can be controlled. Flowsheet Row PULMONARY REHAB OTHER RESPIRATORY from 11/27/2022 in [place] [facility]  Date 11/06/22  Educator [facility]  Instruction Review Code 1- Verbalizes Understanding       Nutrition II: Labels: -Discuss the different components of food labels and how to read food labels. Flowsheet Row PULMONARY REHAB OTHER RESPIRATORY from 11/27/2022 in [place] [facility]  Date 11/06/22  Educator [facility]  Instruction Review Code 1- Verbalizes Understanding       Respiratory Infections: - Discuss the signs and symptoms of respiratory infections, ways to prevent respiratory infections, and the importance of seeking medical treatment when having a respiratory infection.   Stress I: Signs and Symptoms: - Discuss the causes of stress, how stress may lead to anxiety and depression, and ways to limit stress. Flowsheet Row PULMONARY REHAB OTHER RESPIRATORY from 04/25/2022 in [place] [facility]  Date 03/07/22  Educator DF  Instruction Review Code 2- Demonstrated Understanding       Stress II: Relaxation: -Discuss relaxation techniques to limit stress. Flowsheet Row PULMONARY REHAB OTHER RESPIRATORY from 04/25/2022 in [place] [facility]  Date 12/13/21  Educator DM  Instruction Review Code 1- Verbalizes Understanding       Oxygen for Home/Travel: - Discuss how to prepare for travel when on oxygen and proper ways to transport and store oxygen to ensure safety. Flowsheet Row PULMONARY REHAB OTHER RESPIRATORY from 04/25/2022  in [place] CARDIAC REHABILITATION  Date 12/20/21  Educator DF  Instruction Review Code 1- Verbalizes Understanding       Knowledge Questionnaire Score:  Knowledge Questionnaire Score - 09/24/22 1452       Knowledge Questionnaire Score   Pre Score 16/18   missed O2 safety and saturations  Core Components/Risk Factors/Patient Goals at Admission:  Personal Goals and Risk Factors at Admission - 09/24/22 1255       Core Components/Risk Factors/Patient Goals on Admission    Weight Management Yes;Obesity;Weight Loss    Intervention Weight Management: Provide education and appropriate resources to help participant work on and attain dietary goals.;Weight Management/Obesity: Establish reasonable short term and long term weight goals.;Obesity: Provide education and appropriate resources to help participant work on and attain dietary goals.    Admit Weight 282 lb 6.4 oz (128.1 kg)    Goal Weight: Short Term 277 lb (125.6 kg)    Goal Weight: Long Term 272 lb (123.4 kg)    Expected Outcomes Short Term: Continue to assess and modify interventions until short term weight is achieved;Long Term: Adherence to nutrition and physical activity/exercise program aimed toward attainment of established weight goal;Weight Loss: Understanding of general recommendations for a balanced deficit meal plan, which promotes 1-2 lb weight loss per week and includes a negative energy balance of [phone] kcal/d;Understanding recommendations for meals to include 15-35% energy as protein, 25-35% energy from fat, 35-60% energy from carbohydrates, less than 200mg  of dietary cholesterol, 20-35 gm of total fiber daily;Understanding of distribution of calorie intake throughout the day with the consumption of 4-5 meals/snacks    Improve shortness of breath with ADL's Yes    Intervention Provide education, individualized exercise plan and daily activity instruction to help decrease symptoms of SOB with activities of  daily living.    Expected Outcomes Short Term: Improve cardiorespiratory fitness to achieve a reduction of symptoms when performing ADLs;Long Term: Be able to perform more ADLs without symptoms or delay the onset of symptoms    Increase knowledge of respiratory medications and ability to use respiratory devices properly  Yes    Intervention Provide education and demonstration as needed of appropriate use of medications, inhalers, and oxygen therapy.    Expected Outcomes Short Term: Achieves understanding of medications use. Understands that oxygen is a medication prescribed by physician. Demonstrates appropriate use of inhaler and oxygen therapy.;Long Term: Maintain appropriate use of medications, inhalers, and oxygen therapy.    Hypertension Yes    Intervention Provide education on lifestyle modifcations including regular physical activity/exercise, weight management, moderate sodium restriction and increased consumption of fresh fruit, vegetables, and low fat dairy, alcohol moderation, and smoking cessation.;Monitor prescription use compliance.    Expected Outcomes Short Term: Continued assessment and intervention until BP is < 140/57mm HG in hypertensive participants. < 130/49mm HG in hypertensive participants with diabetes, heart failure or chronic kidney disease.;Long Term: Maintenance of blood pressure at goal levels.             Core Components/Risk Factors/Patient Goals Review:   Goals and Risk Factor Review     Row Name 09/30/22 1237 10/14/22 1120 11/18/22 1126         Core Components/Risk Factors/Patient Goals Review   Personal Goals Review Weight Management/Obesity;Increase knowledge of respiratory medications and ability to use respiratory devices properly.;Improve shortness of breath with ADL's;Other;Hypertension Weight Management/Obesity;Increase knowledge of respiratory medications and ability to use respiratory devices properly.;Improve shortness of breath with  ADL's;Other;Hypertension Weight Management/Obesity;Increase knowledge of respiratory medications and ability to use respiratory devices properly.;Improve shortness of breath with ADL's;Hypertension     Review Patient was referred to PR with COPD. She has completed 1 session. Her current weight is 282.4 lbs up 0.4 lbs since her initial visit. She exercises on RA with O2 saturations at 93%. Her blood pressure is hypertensive today.  We will monitor her progress in the program. Paula [name] is doing well in rehab. Her weight is holding steady.  She is pleased that it is not going up.  We have not started to add in home exercise yet, but we talked about how that will help as well.  She is doing better with her breathing for most part, however, heat still gets to her.   She is doing well with meds and will use her inhaler more on bad breathing days. Selen is doing well in rehab. She had a heart follow up last week.  They want her to start to wear compression socks to help with circulation.  She also had a foot follow up and they said she is continuing to make progress, but want to continue to see her in follow up.  Her weight is still yoyoing but steady overall.  Her breathing is still bothering her with the heat.  Her pressrues are doing well.     Expected Outcomes Patient will complete the program meeting both personal and program goals. Short: Conitnue to work on breathing Long: Conitnue to work on Paula [name] loss Short: Conitnue to work on weight loss and try compression socks Long: continue to montior risk factors.              Core Components/Risk Factors/Patient Goals at Discharge (Final Review):   Goals and Risk Factor Review - 11/18/22 1126       Core Components/Risk Factors/Patient Goals Review   Personal Goals Review Weight Management/Obesity;Increase knowledge of respiratory medications and ability to use respiratory devices properly.;Improve shortness of breath with ADL's;Hypertension    Review Phoua is  doing well in rehab. She had a heart follow up last week.  They want her to start to wear compression socks to help with circulation.  She also had a foot follow up and they said she is continuing to make progress, but want to continue to see her in follow up.  Her weight is still yoyoing but steady overall.  Her breathing is still bothering her with the heat.  Her pressrues are doing well.    Expected Outcomes Short: Conitnue to work on weight loss and try compression socks Long: continue to montior risk factors.             ITP Comments:  ITP Comments     Row Name 09/24/22 1419 10/09/22 1249 11/06/22 0833 12/04/22 0829     ITP Comments Pt attended orientation today for Pulmonary Rehab. Completed and gym orientation. Documentation for diagnosis can be found in CE office visit 08/05/22.  Initial ITP created and sent for review to Dr. York Grice, Medical Director. Pt service time 1225-1330. 30 day review completed. ITP sent to Dr.Jehanzeb Memon, Medical Director of  Pulmonary Rehab. Continue with ITP unless changes are made by physician. 30 day review completed. ITP sent to Dr.Jehanzeb Memon, Medical Director of  Pulmonary Rehab. Continue with ITP unless changes are made by physician. 30 day review completed. ITP sent to Dr.Jehanzeb Memon, Medical Director of  Pulmonary Rehab. Continue with ITP unless changes are made by physician.             Comments: 30 day review

## 2022-12-04 NOTE — Progress Notes (Signed)
Daily Session Note  Patient Details  Name: Paula [name] MRN: [id number] Date of Birth: [birth year] Referring Provider:   Flowsheet Row PULMONARY REHAB OTHER RESP ORIENTATION from 09/24/2022 in [facility] CARDIAC REHABILITATION  Referring Provider Warnell Forester MD       Encounter Date: 12/04/2022  Check In:  Session Check In - 12/04/22 1030       Check-In   Supervising physician immediately available to respond to emergencies See telemetry face sheet for immediately available MD    Location [facility]    Staff Present Ross Ludwig, BS, Exercise Physiologist;Terrion Poblano Daphine Deutscher, RN, BSN;Hillary Troutman BSN, RN;Jessica Richgrove, MA, RCEP, CCRP, CCET    Virtual Visit No    Medication changes reported     No    Fall or balance concerns reported    No    Tobacco Cessation No Change    Warm-up and Cool-down Performed on first and last piece of equipment    Resistance Training Performed Yes    VAD Patient? No      Pain Assessment   Currently in Pain? No/denies             Capillary Blood Glucose: No results found for this or any previous visit (from the past 24 hour(s)).    Social History   Tobacco Use  Smoking Status Former   Current packs/day: 0.75   Average packs/day: 0.8 packs/day for 20.0 years (15.0 ttl pk-yrs)   Types: Cigarettes  Smokeless Tobacco Former   Quit date: 10/31/1998    Goals Met:  Proper associated with RPD/PD & O2 Sat Independence with exercise equipment Using PLB without cueing & demonstrates good technique Exercise tolerated well Queuing for purse lip breathing No report of concerns or symptoms today Strength training completed today  Goals Unmet:  Not Applicable  Comments: Pt able to follow exercise prescription today without complaint.  Will continue to monitor for progression.    Dr. Erick Blinks is Medical Director for [facility] Pulmonary Rehab.

## 2022-12-05 ENCOUNTER — Encounter (INDEPENDENT_AMBULATORY_CARE_PROVIDER_SITE_OTHER): Payer: Self-pay

## 2022-12-06 ENCOUNTER — Encounter (HOSPITAL_COMMUNITY): Payer: Medicare Other

## 2022-12-06 ENCOUNTER — Ambulatory Visit (HOSPITAL_COMMUNITY): Payer: Medicare Other

## 2022-12-09 ENCOUNTER — Encounter (HOSPITAL_COMMUNITY)
Admission: RE | Admit: 2022-12-09 | Discharge: 2022-12-09 | Disposition: A | Discharge status: Home / Self Care | Payer: Medicare Other | Source: Ambulatory Visit | Attending: Internal Medicine | Admitting: Internal Medicine

## 2022-12-09 ENCOUNTER — Ambulatory Visit (HOSPITAL_COMMUNITY): Payer: Medicare Other

## 2022-12-09 ENCOUNTER — Encounter (HOSPITAL_COMMUNITY): Payer: Medicare Other

## 2022-12-09 DIAGNOSIS — J449 Chronic obstructive pulmonary disease, unspecified: Secondary | ICD-10-CM

## 2022-12-09 NOTE — Progress Notes (Signed)
Daily Session Note  Patient Details  Name: Paula [name] MRN: [id number] Date of Birth: 09-05-1950 Referring Provider:   Flowsheet Row PULMONARY REHAB OTHER RESP ORIENTATION from 09/24/2022 in [facility] CARDIAC REHABILITATION  Referring Provider Warnell Forester MD       Encounter Date: 12/09/2022  Check In:  Session Check In - 12/09/22 1100       Check-In   Supervising physician immediately available to respond to emergencies See telemetry face sheet for immediately available MD    Location [facility]    Staff Present Ross Ludwig, BS, Exercise Physiologist;Zamaya Rapaport, RN;Debra Laural Benes, RN, Thomos Lemons, MA, RCEP, CCRP, CCET    Virtual Visit No    Medication changes reported     No    Fall or balance concerns reported    No    Tobacco Cessation No Change    Warm-up and Cool-down Performed on first and last piece of equipment    Resistance Training Performed Yes    VAD Patient? No    PAD/SET Patient? No      Pain Assessment   Currently in Pain? No/denies    Pain Score 0-No pain    Multiple Pain Sites No             Capillary Blood Glucose: No results found for this or any previous visit (from the past 24 hour(s)).    Social History   Tobacco Use  Smoking Status Former   Current packs/day: 0.75   Average packs/day: 0.8 packs/day for 20.0 years (15.0 ttl pk-yrs)   Types: Cigarettes  Smokeless Tobacco Former   Quit date: 10/31/1998    Goals Met:  Improved SOB with ADL's Using PLB without cueing & demonstrates good technique Exercise tolerated well Queuing for purse lip breathing No report of concerns or symptoms today  Goals Unmet:  Not Applicable  Comments: Pt able to follow exercise prescription today without complaint.  Will continue to monitor for progression.    Dr. Erick Blinks is Medical Director for [facility] Pulmonary Rehab.

## 2022-12-11 ENCOUNTER — Ambulatory Visit (HOSPITAL_COMMUNITY): Payer: Medicare Other

## 2022-12-11 ENCOUNTER — Encounter (HOSPITAL_COMMUNITY)
Admission: RE | Admit: 2022-12-11 | Discharge: 2022-12-11 | Disposition: A | Discharge status: Home / Self Care | Payer: Medicare Other | Source: Ambulatory Visit | Attending: Internal Medicine

## 2022-12-11 ENCOUNTER — Encounter (HOSPITAL_COMMUNITY): Payer: Medicare Other

## 2022-12-11 DIAGNOSIS — J449 Chronic obstructive pulmonary disease, unspecified: Secondary | ICD-10-CM | POA: Diagnosis not present

## 2022-12-11 NOTE — Progress Notes (Signed)
SUBJECTIVE:  Patient ID: Audrey Ray is a 72 y.o. female  DOB: 29-Nov-1950  ID #: [id number]    Encounter Date: 12/11/2022  Chief Complaint: Pain of the Left Foot  Pain Scale:8  out of 10  History of Present Illness: Patient is a 72 year old female who presents to the office for evaluation of her left foot onset Sunday.  Patient was recently seen by Dr. Leota Jacobsen on 12/03/2022 for sprained ankle which she was given a short cam boot.  However patient noted that on Sunday she began to have nerve pain that was a burning sensation hypersensitivity where she was intolerable to heat or cold on the top of her foot.  Patient states that 30 years ago patient had a meniscus repair however while putting on the bandage on the left extremity they had damaged the nerve in her affected foot.  Patient states that the pain has been persistent and that light touch on the top of her foot elicits pain.  Patient has only tried Tylenol for symptomatic pain relief which has not relieved any symptoms.  Patient denies any loss of range of motion or any numbness or tingling in her foot.      Review of Systems   12/11/2022    Constitutional: Unexplained: Negative  Genitourinary: Frequent Urination: Negative  HEENT: Vision Loss: Negative  Neurological: Memory Loss: Negative  Integumentary: Rash: Negative  Cardiovascular: Palpatations: Negative  Hematologic: Bruises/Bleeds Easily: Negative  Gastrointestinal: Constipation: Negative  Immunological: Seasonal Allergies: Negative  Musculoskeletal: Joint Pain: Positive    Allergies:   Allergies   Allergen Reactions   . Amlodipine    . Codeine    . Other Other (see comments)     NO HERBALS PER PT 22MAR02 FJD sdr tegaserod eac October 15 2004<br/>Reaction(s): Unknown; Note: NO HERBALS PER PT 22MAR02 FJD sdr tegaserod eac October 15 2004   . Pitavastatin    . Pravastatin    . Rosuvastatin    . Terazosin    . Thiopental Unknown   . Verapamil      Medications:   Current Outpatient Medications   Medication Sig Dispense  Refill   . CALCIUM-MAGNESIUM-ZINC PO Take 1 tablet by mouth every morning     . cetirizine (ZyrTEC) 5 MG tablet Take 1 tablet by mouth every evening     . Cholecalciferol 125 MCG (5000 UT) capsule Take 1 tablet by mouth once daily     . famotidine (PEPCID) 40 MG tablet Take 1 tablet by mouth once daily     . Fluticasone Furoate-Vilanterol (Breo Ellipta) 100-25 MCG/ACT aerosol powder  USE 1 INHALATION BY MOUTH INTO THE LUNGS ONCE DAILY     . Multiple Vitamin (multivitamin) capsule Take 1 capsule by mouth every morning     . nortriptyline (PAMELOR) 10 MG capsule Take 1 mg by mouth     . polyethylene glycol (GLYCOLAX) 17 g packet Take 17 g by mouth     . terbinafine (LamiSIL) 250 MG tablet Take 1 tablet by mouth once daily     . zolpidem CR (AMBIEN CR) 6.25 MG CR tablet Take 6.25 mg by mouth     . gabapentin (NEURONTIN) 100 MG capsule Take 1 capsule (100 mg total) by mouth 3 (three) times a day 90 capsule 0     No current facility-administered medications for this visit.     Past Medical History: History reviewed. No pertinent past medical history.  Past Surgical History: History reviewed. No pertinent surgical history.  Past Family History:  Family History   Problem Relation Age of Onset   . No Known Problems Mother    . No Known Problems Father    . No Known Problems Brother    . No Known Problems Sister    . No Known Problems Son    . No Known Problems Daughter    . No Known Problems Other      Past Social History:   Social History     Socioeconomic History   . Marital status: Unknown     Spouse name: Not on file   . Number of children: Not on file   . Years of education: Not on file   . Highest education level: Not on file   Occupational History   . Not on file   Tobacco Use   . Smoking status: Never   . Smokeless tobacco: Never   Substance and Sexual Activity   . Alcohol use: Never   . Drug use: Never   . Sexual activity: Not on file   Other Topics Concern   . Not on file   Social History Narrative   . Not on  file     Social Determinants of Health     Financial Resource Strain: Not on file   Food Insecurity: Not on file   Transportation Needs: Not on file   Physical Activity: Not on file   Stress: Not on file   Social Connections: Not on file   Intimate Partner Violence: Not on file   Housing Stability: Not on file       OBJECTIVE:  Constitutional: No acute distress. Well nourished. Well developed. Her body mass index is 20.36 kg/m.  Eyes:  Sclera are nonicteric.  Respiratory:  No labored breathing.  Cardiovascular:  No marked edema.  Skin:  No marked skin ulcers. Intact without evidence of edema, erythema, ecchymosis, unusual skin lesions, or overt acne.  Neurological:  No marked sensory loss noted.  Psychiatric: Alert and oriented x3.  Musculoskeletal   Skin examination does not reveal any bruising, ecchymosis, erythema, warmth or any obvious deformities    There is tenderness to portion on the dorsal aspect of her foot around the third and fourth metatarsal to light touch.    Toe range of motion present    Patient continues to have sensation all aspects of her foot  Pedal pulse intact 2+  Neurovascularly intact    IMAGING / STUDIES:         No imaging obtained     ASSESSMENT:  (N82.956) Left foot pain  (primary encounter diagnosis)    Patient is a 72 year old female presents today with burning and hypersensitivity sensation and pain on the top of her foot since Sunday.  With no inciting event or trauma.  At this time patient will be given gabapentin for symptomatic relief and was advised to also use conservative measures such as rest, ice and heat as tolerated as well as elevation and as well as over-the-counter Voltaren to help with pain relief.  Patient advised to continue conservative management and to follow-up with Dr. Leota Jacobsen.    PROCEDURES:  Procedures  PLAN:  1. I discussed my findings and dx, as well as multiple treatment options with the patient today. All questions answered.  2.  Patient given gabapentin and  advised continue conservative management with over-the-counter diclofenac  3.  Patient will follow-up with Dr. Leota Jacobsen    No follow-ups on file.    Orders Placed This Encounter   .  gabapentin (NEURONTIN) 100 MG capsule     Kasandra Knudsen, [place]   Supervising Physician: Blanchard Kelch MD    Voice recognition software is utilized; therefore, this note may contain transcription errors.  An addendum can be issued upon request.

## 2022-12-11 NOTE — Progress Notes (Signed)
Daily Session Note  Patient Details  Name: Paula [name] MRN: [id number] Date of Birth: 06/08/1950 Referring Provider:   Flowsheet Row PULMONARY REHAB OTHER RESP ORIENTATION from 09/24/2022 in [facility] CARDIAC REHABILITATION  Referring Provider Warnell Forester MD       Encounter Date: 12/11/2022  Check In:  Session Check In - 12/11/22 1033       Check-In   Supervising physician immediately available to respond to emergencies See telemetry face sheet for immediately available MD    Location [facility]    Staff Present Ross Ludwig, BS, Exercise Physiologist;Jessica Juanetta Gosling, MA, RCEP, CCRP, CCET;Hillary Troutman BSN, RN    Virtual Visit No    Medication changes reported     No    Fall or balance concerns reported    No    Tobacco Cessation No Change    Warm-up and Cool-down Performed on first and last piece of equipment    Resistance Training Performed Yes    VAD Patient? No    PAD/SET Patient? No      Pain Assessment   Currently in Pain? No/denies    Pain Score 0-No pain    Multiple Pain Sites No             Capillary Blood Glucose: No results found for this or any previous visit (from the past 24 hour(s)).    Social History   Tobacco Use  Smoking Status Former   Current packs/day: 0.75   Average packs/day: 0.8 packs/day for 20.0 years (15.0 ttl pk-yrs)   Types: Cigarettes  Smokeless Tobacco Former   Quit date: 10/31/1998    Goals Met:  Independence with exercise equipment Exercise tolerated well No report of concerns or symptoms today Strength training completed today  Goals Unmet:  Not Applicable  Comments: Pt able to follow exercise prescription today without complaint.  Will continue to monitor for progression.    Dr. Erick Blinks is Medical Director for [facility] Pulmonary Rehab.

## 2022-12-13 ENCOUNTER — Encounter (HOSPITAL_COMMUNITY)
Admission: RE | Admit: 2022-12-13 | Discharge: 2022-12-13 | Disposition: A | Discharge status: Home / Self Care | Payer: Medicare Other | Source: Ambulatory Visit | Attending: Internal Medicine | Admitting: Internal Medicine

## 2022-12-13 ENCOUNTER — Encounter (HOSPITAL_COMMUNITY): Payer: Medicare Other

## 2022-12-13 ENCOUNTER — Ambulatory Visit (HOSPITAL_COMMUNITY): Payer: Medicare Other

## 2022-12-13 DIAGNOSIS — J449 Chronic obstructive pulmonary disease, unspecified: Secondary | ICD-10-CM | POA: Diagnosis not present

## 2022-12-13 NOTE — Telephone Encounter (Signed)
Called and discussed with her. She has not been using the immobilization for her ankle injury and her swelling is persisted. Discussed that her nerve related symptoms will not improve without reducing the swelling. Recommend wearing the boot, elevation and ice. We will also send her a Medrol Dosepak to reduce inflammation. She can contact us with further questions.

## 2022-12-13 NOTE — Telephone Encounter (Signed)
Patient states that toes are now numb.  Wants to know if the gabapentin needs to be increased

## 2022-12-13 NOTE — Progress Notes (Unsigned)
[facility] CANCER RISK ASSESSMENT      Dear Dr. Bonnetta Barry ref. provider found    I had the pleasure of seeing your patient in our department today for a cancer risk assessment. Below, you will find her initial consultation note.  If you have any questions, please feel free to contact me.  Again, thank you for allowing me to participate in this patient's care.       History of Present Illness:     Audrey Ray is a 72 y.o. female  with a family history of cancer who presents for a comprehensive cancer risk assessment.     PERSONAL CANCER HISTORY/TREATMENT:     FAMILY HISTORY/PEDIGREE:  (can use dot phrase .NOPED if no pedigree completed)    ANCESTRY:   Maternal: {Ancestry:61130}    Paternal: {Ancestry:61130}      GENETIC TESTING RESULTS:  (can use dot phrase .NONE if not completed)    GYNECOLOGIC HISTORY:  Menarche:  {Menarche:61397}  Pregnancies: G***P***  Age at first delivery:{age:61889}  Breastfeeding: {WN:02725}  OCP use: {OCP:61401}  Fertility use: {Y/N:21936}  HRT use: {DGU:44034}       Menopause: {Menopause:31378}  No LMP recorded. Patient has had a hysterectomy.   Gynecologic surgeries: {GYN Surg:61403}  History of abnormal pap?   {abn pap:61930}    LIFESTYLE FACTORS:  -Marital status: {Status:21173}  -Employment:   -Diet: Vegetable intake: {vegetable intake:43990}, Red meat: {red meat:61850}  Processed meat: {proc VQQV:95638}  Dietary restrictions: {restrictions:61852}  -Exercise: {exercise frequency:43991}  Type of exercise: {training:61853}  -Alcohol use: {History:11675}    -Tobacco use:  {status:61931}    PROVIDERS:  PCP: Rutherford Guys, MD   Gynecologist: ***    Allergies[1]  Medications Taking[2]  Medical History[3]  Past Surgical History[4]  Family History[5]  Social History[6]    ROS:   Review of Systems    Objective:   There were no vitals filed for this visit.  Physical Exam    LAB DATA:     No results found for: "PROSTATESPEC", "PSA"    PATH:    RADIOLOGY DATA:   ***    Assessment and Plan:   There  are no diagnoses linked to this encounter.    Prevention/Lifestyle Interventions   INBODY:     Discussed the importance of weight bearing exercise in weight management and in building and maintaining healthy bones.     {[place] Dietician (Optional):61854}    We discussed the following lifestyle modifications in the prevention and risk reduction for the development of cancer:   {Lifestyle Modifications:61404}    Genetics   {testing:61857}  It was recommended that her {family:61856} consider genetic testing to {risk:61855}    Clinical Trials   We discussed clinical trials being offered within the [facility].   {clinical trials:61861}     Psychosocial Needs   {psychosocial:61858}    Health Maintenance/Screening   Mammogram: 11/15/2022  Breast MRI: {VFI:43329}  DEXA scan: {JJOA:41660}  Pap smear: {pap:61863}  Colorectal cancer screening: Colonoscopy 12/17/21 no polyps, repeat in 7 years  Skin exam: {derm:61891}  Lung cancer screening: {lung ca screen:61893}  HPV vaccination: {hpv:61892}      Follow Up/Referrals   No follow-ups on file.     Luther Redo, FNP      The following activities were performed on the date of service:  {timejustification:50099}  Total time spent performing activities on date of service:  ***  minutes           [1] No Known Allergies  [  2]   No outpatient medications have been marked as taking for the 12/18/22 encounter (Appointment) with Luther Redo, FNP.   [3]   Past Medical History:  Diagnosis Date    Abnormal vision     glasses    Arthritis 10/2014    right knee    Cochlear implant in place     removed 2021    Constipation 06/2016    Depression 1990    Fecal incontinence     Gastric ulceration 07/2016    Gastroesophageal reflux disease     Controlled with diet    Headache 1990    Left side migraines- controlled by pamalor    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, right ear; has Left ear hearing aid    History of acoustic neuroma     surgery 2015     Hyperlipidemia     Controlled diet and exercise    Pericardial effusion 2017    "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   [4]   Past Surgical History:  Procedure Laterality Date    24, 48, OR 96 HOUR AMBULATORY ESOPHAGEAL PH MONITORING (BRAVO) N/A 12/01/2018    Procedure: EGD, 48 HR PH MONITOR, INSERTION;  Surgeon: Alisia Ferrari, MD;  Location: [facility];  Service: Gastroenterology;  Laterality: N/A;  egd w/ bravo  q1-unk, md req 30 min    ADENOIDECTOMY  1960    ARTHROSCOPY, KNEE Right 11/29/2014    Procedure: ARTHROSCOPY, KNEE, RIGHT KNEE PARTIAL MENISCECTOMY/SUBCHONDROPLASTY (ORIF TIBIAL PLATEU) ;  Surgeon: Joni Reining, MD;  Location: [facility];  Service: Orthopedics;  Laterality: Right;  RIGHT KNEE PARTIAL MENISCECTOMY/SUBCHONDROPLASTY (ORIF TIBIAL PLATEU)      BRAIN SURGERY  2015    Acoustic neuroma and 4 CFL leak procedures    CESAREAN SECTION  1985    COLONOSCOPY, DIAGNOSTIC (SCREENING) N/A 12/17/2021    Procedure: COLONOSCOPY;  Surgeon: Francisca December, MD;  Location: [facility];  Service: Gastroenterology;  Laterality: N/A;    CRANIOTOMY, TRANS-PETROSAL APPROACH  04/14/2013    Procedure: Marcy Panning APPROACH;  Surgeon: Ericka Pontiff, MD;  Location: [facility];  Service: Neurosurgery;  Laterality: Right;  RIGHT TRANSLABYRINTHINE APPROACH FOR VESTIBULAR SCHWANNOMA, ABDOMINAL FAT GRAFT    EGD N/A 10/11/2016    Procedure: EGD;  Surgeon: Juanetta Snow, MD;  Location: [facility];  Service: Gastroenterology;  Laterality: N/A;  EGD  Q1=N/A    EGD, BIOPSY N/A 12/17/2021    Procedure: EGD, BIOPSY;  Surgeon: Francisca December, MD;  Location: [facility];  Service: Gastroenterology;  Laterality: N/A;    EGD, COLONOSCOPY N/A 08/02/2016    Procedure: EGD, COLONOSCOPY;  Surgeon: Juanetta Snow, MD;  Location: [facility];   Service: Gastroenterology;  Laterality: N/A;  EGD/COLONOSCOPY  Q1=N    HYSTERECTOMY  1995    INSERTION, OSSEOINTEGRATED IMP/TEMPORAL BONE/BAHA  07/05/2013    Procedure: INSERTION, OSSEOINTEGRATED IMP/TEMPORAL BONE/BAHA;  Surgeon: Judie Bonus, MD PHD;  Location: [facility];  Service: ENT;  Laterality: N/A;  RIGHT BAHA    INSERTION, OSSEOINTEGRATED IMP/TEMPORAL BONE/BAHA Right 11/23/2019    Procedure: REMOVAL OF OSSEOINTEGRATED IMP/TEMPORAL BONE/BAHA;  Surgeon: Thurman Coyer, MD;  Location: [facility];  Service: ENT;  Laterality: Right;  REMOVAL OF RIGHT BAHA ATTRACT, DEBRIDEMENT OF RIGHT MASTOID CAVITY  KNEE ARTHROSCOPY W/ MENISCAL REPAIR Left 1999    MASTOIDECTOMY, MODIFIED  04/14/2013    Procedure: MASTOIDECTOMY, MODIFIED;  Surgeon: Judie Bonus, MD PHD;  Location: [facility];  Service: ENT;  Laterality: N/A;    MASTOIDECTOMY, MODIFIED  07/05/2013    Procedure: MASTOIDECTOMY, MODIFIED;  Surgeon: Judie Bonus, MD PHD;  Location: [facility];  Service: ENT;  Laterality: N/A;  RIGHT MIDDLE EAR OBLITERATION W/ OVERSEW OF EAR CANAL,    MYRINGOPLASTY, FAT GRAFT  07/05/2013    Procedure: MYRINGOPLASTY, FAT GRAFT;  Surgeon: Judie Bonus, MD PHD;  Location: [facility];  Service: ENT;  Laterality: Right;   ABDOMINAL FAT GRAFT,     TONSILLECTOMY  1960    TYMPANOPLASTY, EXPLORATION , REMOVAL CHOLESTEATOMA  04/19/2013    Procedure: TYMPANOPLASTY, EXPLORATION , REMOVAL CHOLESTEATOMA;  Surgeon: Judie Bonus, MD PHD;  Location: [facility];  Service: ENT;  Laterality: Right;  Middle Ear Obliteration with fat graft harvest from abdomen; Control of CSF leak   [5] No family history on file.  [6]   Social History  Tobacco Use    Smoking status: Former     Current packs/day: 0.00     Average packs/day: 0.5 packs/day for 2.0 years (1.0 ttl pk-yrs)     Types: Cigarettes     Start date: 03/25/1969     Quit date: 03/26/1971     Years since quitting: 51.7    Smokeless tobacco: Never    Tobacco comments:      Smoked in college   Vaping Use    Vaping status: Never Used   Substance Use Topics    Alcohol use: Yes     Alcohol/week: 5.0 standard drinks of alcohol     Types: 5 Glasses of wine per week     Comment: Some weeks less or none    Drug use: No

## 2022-12-13 NOTE — Progress Notes (Signed)
Daily Session Note  Patient Details  Name: Paula [name] MRN: [id number] Date of Birth: Aug 19, 1950 Referring Provider:   Flowsheet Row PULMONARY REHAB OTHER RESP ORIENTATION from 09/24/2022 in [facility] CARDIAC REHABILITATION  Referring Provider Warnell Forester MD       Encounter Date: 12/13/2022  Check In:  Session Check In - 12/13/22 1045       Check-In   Supervising physician immediately available to respond to emergencies See telemetry face sheet for immediately available ER MD    Location [facility]    Staff Present Rodena Medin, RN, Pleas Koch, RN, BSN;Jessica Juanetta Gosling, MA, RCEP, CCRP, CCET;Heather Fredric Mare, [place], Exercise Physiologist    Virtual Visit No    Medication changes reported     No    Fall or balance concerns reported    No    Warm-up and Cool-down Performed on first and last piece of equipment    Resistance Training Performed Yes    VAD Patient? No    PAD/SET Patient? No      Pain Assessment   Currently in Pain? No/denies    Pain Score 0-No pain    Multiple Pain Sites No             Capillary Blood Glucose: No results found for this or any previous visit (from the past 24 hour(s)).    Social History   Tobacco Use  Smoking Status Former   Current packs/day: 0.75   Average packs/day: 0.8 packs/day for 20.0 years (15.0 ttl pk-yrs)   Types: Cigarettes  Smokeless Tobacco Former   Quit date: 10/31/1998    Goals Met:  Proper associated with RPD/PD & O2 Sat Independence with exercise equipment Using PLB without cueing & demonstrates good technique Exercise tolerated well No report of concerns or symptoms today Strength training completed today  Goals Unmet:  Not Applicable  Comments: Pt able to follow exercise prescription today without complaint.  Will continue to monitor for progression.    Dr. Erick Blinks is Medical Director for [facility] Pulmonary Rehab.

## 2022-12-16 ENCOUNTER — Encounter (HOSPITAL_COMMUNITY)
Admission: RE | Admit: 2022-12-16 | Discharge: 2022-12-16 | Disposition: A | Discharge status: Home / Self Care | Payer: Medicare Other | Source: Ambulatory Visit | Attending: Internal Medicine

## 2022-12-16 ENCOUNTER — Encounter (HOSPITAL_COMMUNITY): Payer: Medicare Other

## 2022-12-16 ENCOUNTER — Ambulatory Visit (HOSPITAL_COMMUNITY): Payer: Medicare Other

## 2022-12-16 DIAGNOSIS — J449 Chronic obstructive pulmonary disease, unspecified: Secondary | ICD-10-CM | POA: Diagnosis not present

## 2022-12-16 NOTE — Progress Notes (Signed)
Daily Session Note  Patient Details  Name: Paula [name] MRN: [id number] Date of Birth: 09-18-1950  Referring Provider:   Flowsheet Row PULMONARY REHAB OTHER RESP ORIENTATION from 09/24/2022 in [facility] CARDIAC REHABILITATION  Referring Provider Warnell Forester MD       Encounter Date: 12/16/2022  Check In:  Session Check In - 12/16/22 1100       Check-In   Supervising physician immediately available to respond to emergencies See telemetry face sheet for immediately available MD    Location [facility]    Staff Present Ross Ludwig, BS, Exercise Physiologist;Debra Laural Benes, RN, BSN;Sha Burling, RN;Daphyne Daphine Deutscher, RN, BSN;Jessica Hawkins, MA, RCEP, CCRP, CCET    Virtual Visit No    Medication changes reported     No    Fall or balance concerns reported    No    Tobacco Cessation No Change    Warm-up and Cool-down Performed on first and last piece of equipment    Resistance Training Performed Yes    VAD Patient? No    PAD/SET Patient? No      Pain Assessment   Currently in Pain? No/denies    Pain Score 0-No pain    Multiple Pain Sites No             Capillary Blood Glucose: No results found for this or any previous visit (from the past 24 hour(s)).    Social History   Tobacco Use  Smoking Status Former   Current packs/day: 0.75   Average packs/day: 0.8 packs/day for 20.0 years (15.0 ttl pk-yrs)   Types: Cigarettes  Smokeless Tobacco Former   Quit date: 10/31/1998    Goals Met:  Proper associated with RPD/PD & O2 Sat Independence with exercise equipment Using PLB without cueing & demonstrates good technique Exercise tolerated well No report of concerns or symptoms today Strength training completed today  Goals Unmet:  Not Applicable  Comments: Pt able to follow exercise prescription today without complaint.  Will continue to monitor for progression.    Dr. Erick Blinks is Medical Director for [facility] Pulmonary  Rehab.

## 2022-12-18 ENCOUNTER — Encounter: Payer: Self-pay | Admitting: Nurse Practitioner

## 2022-12-18 ENCOUNTER — Ambulatory Visit: Payer: Medicare Other | Attending: Hematology & Oncology | Admitting: Nurse Practitioner

## 2022-12-18 ENCOUNTER — Ambulatory Visit (HOSPITAL_COMMUNITY): Payer: Medicare Other

## 2022-12-18 ENCOUNTER — Encounter (HOSPITAL_COMMUNITY)
Admission: RE | Admit: 2022-12-18 | Discharge: 2022-12-18 | Disposition: A | Discharge status: Home / Self Care | Payer: Medicare Other | Source: Ambulatory Visit | Attending: Internal Medicine | Admitting: Internal Medicine

## 2022-12-18 ENCOUNTER — Encounter (HOSPITAL_COMMUNITY): Payer: Medicare Other

## 2022-12-18 VITALS — BP 98/57 | HR 82 | Temp 98.6°F | Resp 16 | Ht 67.0 in | Wt 135.8 lb

## 2022-12-18 DIAGNOSIS — J449 Chronic obstructive pulmonary disease, unspecified: Secondary | ICD-10-CM

## 2022-12-18 DIAGNOSIS — Z719 Counseling, unspecified: Secondary | ICD-10-CM | POA: Insufficient documentation

## 2022-12-18 DIAGNOSIS — Z809 Family history of malignant neoplasm, unspecified: Secondary | ICD-10-CM | POA: Insufficient documentation

## 2022-12-18 NOTE — Progress Notes (Signed)
Daily Session Note  Patient Details  Name: Paula [name] MRN: [id number] Date of Birth: [birth year] Referring Provider:   Flowsheet Row PULMONARY REHAB OTHER RESP ORIENTATION from 09/24/2022 in [facility] CARDIAC REHABILITATION  Referring Provider Warnell Forester MD       Encounter Date: 12/18/2022  Check In:  Session Check In - 12/18/22 1034       Check-In   Supervising physician immediately available to respond to emergencies See telemetry face sheet for immediately available MD    Location [facility]    Staff Present Ross Ludwig, BS, Exercise Physiologist;Jessica Juanetta Gosling, MA, RCEP, CCRP, CCET;Hillary Troutman BSN, RN    Virtual Visit No    Medication changes reported     No    Fall or balance concerns reported    No    Tobacco Cessation No Change    Warm-up and Cool-down Performed on first and last piece of equipment    Resistance Training Performed Yes    VAD Patient? No    PAD/SET Patient? No      Pain Assessment   Currently in Pain? No/denies    Multiple Pain Sites No             Capillary Blood Glucose: No results found for this or any previous visit (from the past 24 hour(s)).    Social History   Tobacco Use  Smoking Status Former   Current packs/day: 0.75   Average packs/day: 0.8 packs/day for 20.0 years (15.0 ttl pk-yrs)   Types: Cigarettes  Smokeless Tobacco Former   Quit date: 10/31/1998    Goals Met:  Independence with exercise equipment Exercise tolerated well No report of concerns or symptoms today Strength training completed today  Goals Unmet:  Not Applicable  Comments: Pt able to follow exercise prescription today without complaint.  Will continue to monitor for progression.    Dr. Erick Blinks is Medical Director for [facility] Pulmonary Rehab.

## 2022-12-20 ENCOUNTER — Encounter (HOSPITAL_COMMUNITY): Payer: Medicare Other

## 2022-12-20 ENCOUNTER — Ambulatory Visit (HOSPITAL_COMMUNITY): Payer: Medicare Other

## 2022-12-23 ENCOUNTER — Other Ambulatory Visit: Payer: Self-pay | Admitting: Internal Medicine

## 2022-12-23 ENCOUNTER — Ambulatory Visit (HOSPITAL_COMMUNITY): Payer: Medicare Other

## 2022-12-23 ENCOUNTER — Encounter (HOSPITAL_COMMUNITY): Payer: Medicare Other

## 2022-12-25 ENCOUNTER — Telehealth: Payer: Self-pay

## 2022-12-25 ENCOUNTER — Encounter (HOSPITAL_COMMUNITY): Payer: Medicare Other

## 2022-12-25 ENCOUNTER — Ambulatory Visit (HOSPITAL_COMMUNITY): Payer: Medicare Other

## 2022-12-25 ENCOUNTER — Encounter (HOSPITAL_COMMUNITY)
Admission: RE | Admit: 2022-12-25 | Discharge: 2022-12-25 | Disposition: A | Discharge status: Home / Self Care | Payer: Medicare Other | Source: Ambulatory Visit | Attending: Internal Medicine | Admitting: Internal Medicine

## 2022-12-25 DIAGNOSIS — J449 Chronic obstructive pulmonary disease, unspecified: Secondary | ICD-10-CM | POA: Insufficient documentation

## 2022-12-25 NOTE — Telephone Encounter (Signed)
Confirmed voicemail was old

## 2022-12-25 NOTE — Progress Notes (Signed)
Daily Session Note  Patient Details  Name: Paula [name] MRN: [id number] Date of Birth: 08/28/1950 Referring Provider:   Flowsheet Row PULMONARY REHAB OTHER RESP ORIENTATION from 09/24/2022 in [facility] CARDIAC REHABILITATION  Referring Provider Warnell Forester MD       Encounter Date: 12/25/2022  Check In:  Session Check In - 12/25/22 1020       Check-In   Supervising physician immediately available to respond to emergencies See telemetry face sheet for immediately available MD    Location [facility]    Staff Present Ross Ludwig, BS, Exercise Physiologist;Arnett Duddy [place] BSN, RN;Jessica [place], MA, RCEP, CCRP, Dow Adolph, RN, BSN    Virtual Visit No    Medication changes reported     No    Fall or balance concerns reported    No    Tobacco Cessation No Change    Warm-up and Cool-down Performed on first and last piece of equipment    Resistance Training Performed Yes    VAD Patient? No    PAD/SET Patient? No      Pain Assessment   Currently in Pain? No/denies    Pain Score 0-No pain    Multiple Pain Sites No             Capillary Blood Glucose: No results found for this or any previous visit (from the past 24 hour(s)).    Social History   Tobacco Use  Smoking Status Former   Current packs/day: 0.75   Average packs/day: 0.8 packs/day for 20.0 years (15.0 ttl pk-yrs)   Types: Cigarettes  Smokeless Tobacco Former   Quit date: 10/31/1998    Goals Met:  Proper associated with RPD/PD & O2 Sat Independence with exercise equipment Using PLB without cueing & demonstrates good technique Exercise tolerated well Queuing for purse lip breathing No report of concerns or symptoms today Strength training completed today  Goals Unmet:  Not Applicable  Comments: Marland KitchenMarland KitchenPt able to follow exercise prescription today without complaint.  Will continue to monitor for progression.    Dr. Erick Blinks is Medical Director for [facility]  Pulmonary Rehab.

## 2022-12-27 ENCOUNTER — Encounter (HOSPITAL_COMMUNITY)
Admission: RE | Admit: 2022-12-27 | Discharge: 2022-12-27 | Disposition: A | Discharge status: Home / Self Care | Payer: Medicare Other | Source: Ambulatory Visit | Attending: Internal Medicine

## 2022-12-27 DIAGNOSIS — J449 Chronic obstructive pulmonary disease, unspecified: Secondary | ICD-10-CM

## 2022-12-27 NOTE — Progress Notes (Signed)
Daily Session Note  Patient Details  Name: Paula [name] MRN: [id number] Date of Birth: June 12, 1950 Referring Provider:   Flowsheet Row PULMONARY REHAB OTHER RESP ORIENTATION from 09/24/2022 in [facility] CARDIAC REHABILITATION  Referring Provider Warnell Forester MD       Encounter Date: 12/27/2022  Check In:  Session Check In - 12/27/22 1045       Check-In   Supervising physician immediately available to respond to emergencies See telemetry face sheet for immediately available ER MD    Location [facility]    Staff Present Rodena Medin, RN, BSN;Hillary Troutman BSN, RN;Jessica [place], MA, RCEP, CCRP, CCET    Virtual Visit No    Medication changes reported     No    Fall or balance concerns reported    No    Warm-up and Cool-down Performed on first and last piece of equipment    Resistance Training Performed Yes    VAD Patient? No    PAD/SET Patient? No      Pain Assessment   Currently in Pain? No/denies    Pain Score 0-No pain    Multiple Pain Sites No             Capillary Blood Glucose: No results found for this or any previous visit (from the past 24 hour(s)).    Social History   Tobacco Use  Smoking Status Former   Current packs/day: 0.75   Average packs/day: 0.8 packs/day for 20.0 years (15.0 ttl pk-yrs)   Types: Cigarettes  Smokeless Tobacco Former   Quit date: 10/31/1998    Goals Met:  Proper associated with RPD/PD & O2 Sat Independence with exercise equipment Using PLB without cueing & demonstrates good technique Exercise tolerated well No report of concerns or symptoms today Strength training completed today  Goals Unmet:  Not Applicable  Comments: Pt able to follow exercise prescription today without complaint.  Will continue to monitor for progression.    Dr. Erick Blinks is Medical Director for [facility] Pulmonary Rehab.

## 2022-12-30 ENCOUNTER — Encounter (HOSPITAL_COMMUNITY): Payer: Medicare Other

## 2023-01-01 ENCOUNTER — Encounter (HOSPITAL_COMMUNITY): Payer: Medicare Other

## 2023-01-01 ENCOUNTER — Encounter (HOSPITAL_COMMUNITY): Payer: Self-pay | Admitting: *Deleted

## 2023-01-01 DIAGNOSIS — J449 Chronic obstructive pulmonary disease, unspecified: Secondary | ICD-10-CM

## 2023-01-01 NOTE — Progress Notes (Signed)
Pulmonary Individual Treatment Plan  Patient Details  Name: Paula [name] MRN: [id number] Date of Birth: December 15, 1950 Referring Provider:   Flowsheet Row PULMONARY REHAB OTHER RESP ORIENTATION from 09/24/2022 in [facility] CARDIAC REHABILITATION  Referring Provider Warnell Forester MD       Initial Encounter Date:  Flowsheet Row PULMONARY REHAB OTHER RESP ORIENTATION from 09/24/2022 in [place] CARDIAC REHABILITATION  Date 09/24/22       Visit Diagnosis: Chronic obstructive pulmonary disease, unspecified COPD type (HCC)  Patient's Home Medications on Admission:   Current Outpatient Medications:    acetaminophen (TYLENOL) 500 MG tablet, Take 500-1,000 mg by mouth every 6 (six) hours as needed (pain.)., Disp: , Rfl:    albuterol (VENTOLIN HFA) 108 (90 Base) MCG/ACT inhaler, Inhale 2 puffs into the lungs every 6 (six) hours as needed for wheezing or shortness of breath., Disp: , Rfl:    azelastine (ASTELIN) 0.1 % nasal spray, Place 2 sprays into both nostrils 2 (two) times daily as needed for allergies. Use in each nostril as directed, Disp: , Rfl:    budesonide-formoterol (SYMBICORT) 160-4.5 MCG/ACT inhaler, Inhale 2 puffs into the lungs 2 (two) times daily., Disp: , Rfl:    Cholecalciferol (VITAMIN D) 125 MCG (5000 UT) CAPS, Take 5,000 Units by mouth in the morning., Disp: , Rfl:    clotrimazole-betamethasone (LOTRISONE) cream, Apply 1 application topically 2 (two) times daily as needed (skin irritation.). , Disp: , Rfl:    diclofenac (VOLTAREN) 75 MG EC tablet, Take 75 mg by mouth daily as needed (arthritis pain)., Disp: , Rfl:    ezetimibe (ZETIA) 10 MG tablet, Take 10 mg by mouth in the morning., Disp: , Rfl:    famotidine (PEPCID) 40 MG tablet, Take 40 mg by mouth at bedtime., Disp: , Rfl:    fluticasone (FLONASE) 50 MCG/ACT nasal spray, Place 2 sprays into both nostrils daily as needed for allergies., Disp: , Rfl:    guaiFENesin (MUCINEX) 600 MG 12 hr tablet, Take 600 mg by mouth  daily as needed (congestion/COPD issues)., Disp: , Rfl:    hydrochlorothiazide (HYDRODIURIL) 25 MG tablet, Take 25 mg by mouth in the morning., Disp: , Rfl:    Ketotifen Fumarate (ALLERGY EYE DROPS OP), Place 1 drop into both eyes 2 (two) times daily as needed (allergy eyes)., Disp: , Rfl:    loratadine (CLARITIN) 10 MG tablet, Take 10 mg by mouth daily as needed for allergies., Disp: , Rfl:    losartan (COZAAR) 100 MG tablet, Take 100 mg by mouth in the morning., Disp: , Rfl:    montelukast (SINGULAIR) 10 MG tablet, Take 10 mg by mouth at bedtime., Disp: , Rfl:    Multiple Vitamins-Minerals (PRESERVISION AREDS 2 PO), Take 1 tablet by mouth in the morning and at bedtime., Disp: , Rfl:    NON FORMULARY, CPAP, O2 at night, Disp: , Rfl:    omeprazole (PRILOSEC) 40 MG capsule, Take 40 mg by mouth daily before breakfast., Disp: , Rfl:    sertraline (ZOLOFT) 100 MG tablet, Take 100 mg by mouth at bedtime., Disp: , Rfl:    terbinafine (LAMISIL) 250 MG tablet, Take 250 mg by mouth in the morning., Disp: , Rfl:   Past Medical History: Past Medical History:  Diagnosis Date   Anxiety    Arthritis    COPD (chronic obstructive pulmonary disease) (HCC)    Heart murmur    Hypertension    Lung nodule    LLL   Sleep apnea  CPAP with O2 2 liters    Tubal ectopic pregnancy 1970   Vitelliform macular dystrophy     Tobacco Use: Social History   Tobacco Use  Smoking Status Former   Current packs/day: 0.75   Average packs/day: 0.8 packs/day for 20.0 years (15.0 ttl pk-yrs)   Types: Cigarettes  Smokeless Tobacco Former   Quit date: 10/31/1998    Labs: Review Flowsheet        No data to display          Capillary Blood Glucose: No results found for: "GLUCAP"   Pulmonary Assessment Scores:  Pulmonary Assessment Scores     Row Name 09/24/22 1454         ADL UCSD   ADL Phase Entry     SOB Score total 64     Rest 1     Walk 3     Stairs 4     Bath 3     Dress 1     Shop 4        CAT Score   CAT Score 18       mMRC Score   mMRC Score 3             UCSD: Self-administered rating of dyspnea associated with activities of daily living (ADLs) 6-point scale (0 = "not at all" to 5 = "maximal or unable to do because of breathlessness")  Scoring Scores range from 0 to 120.  Minimally important difference is 5 units  CAT: CAT can identify the health impairment of COPD patients and is better correlated with disease progression.  CAT has a scoring range of zero to 40. The CAT score is classified into four groups of low (less than 10), medium (10 - 20), high (21-30) and very high (31-40) based on the impact level of disease on health status. A CAT score over 10 suggests significant symptoms.  A worsening CAT score could be explained by an exacerbation, poor medication adherence, poor inhaler technique, or progression of COPD or comorbid conditions.  CAT MCID is 2 points  mMRC: mMRC (Modified Medical Research Council) Dyspnea Scale is used to assess the degree of baseline functional disability in patients of respiratory disease due to dyspnea. No minimal important difference is established. A decrease in score of 1 point or greater is considered a positive change.   Pulmonary Function Assessment:  Pulmonary Function Assessment - 09/24/22 1454       Breath   Shortness of Breath Yes;Fear of Shortness of Breath;Limiting activity             Exercise Target Goals: Exercise Program Goal: Individual exercise prescription set using results from initial 6 min walk test and THRR while considering  patient's activity barriers and safety.   Exercise Prescription Goal: Initial exercise prescription builds to 30-45 minutes a day of aerobic activity, 2-3 days per week.  Home exercise guidelines will be given to patient during program as part of exercise prescription that the participant will acknowledge.  Activity Barriers & Risk Stratification:  Activity Barriers &  Cardiac Risk Stratification - 09/24/22 1250       Activity Barriers & Cardiac Risk Stratification   Activity Barriers Arthritis;Deconditioning;Muscular Weakness;Shortness of Breath;Joint Problems;Balance Concerns   currently in PT for foot            6 Minute Walk:  6 Minute Walk     Row Name 09/24/22 1428         6 Minute Walk  Phase Initial     Distance 675 feet     Walk Time 4.4 minutes     # of Rest Breaks 2  46 sec, 38 sec     MPH 1.74     METS 1.01     RPE 13     Perceived Dyspnea  16     VO2 Peak 3.53     Symptoms Yes (comment)     Comments hip pain 7/10     Resting HR 78 bpm     Resting BP 128/70     Resting Oxygen Saturation  96 %     Exercise Oxygen Saturation  during 6 min walk 88 %     Max Ex. HR 116 bpm     Max Ex. BP 204/112     2 Minute Post BP 144/72       Interval HR   1 Minute HR 108     2 Minute HR 100  seated     3 Minute HR 101     4 Minute HR 116     5 Minute HR 107     6 Minute HR 109     2 Minute Post HR 71     Interval Heart Rate? Yes       Interval Oxygen   Interval Oxygen? Yes     Baseline Oxygen Saturation % 96 %     1 Minute Oxygen Saturation % 90 %     1 Minute Liters of Oxygen 0 L  Room Air     2 Minute Oxygen Saturation % 90 %  seated     2 Minute Liters of Oxygen 0 L     3 Minute Oxygen Saturation % 88 %     3 Minute Liters of Oxygen 0 L     4 Minute Oxygen Saturation % 89 %     4 Minute Liters of Oxygen 0 L     5 Minute Oxygen Saturation % 88 %     5 Minute Liters of Oxygen 0 L     6 Minute Oxygen Saturation % 89 %     6 Minute Liters of Oxygen 0 L     2 Minute Post Oxygen Saturation % 95 %     2 Minute Post Liters of Oxygen 0 L              Oxygen Initial Assessment:  Oxygen Initial Assessment - 09/24/22 1249       Home Oxygen   Home Oxygen Device Home Concentrator    Sleep Oxygen Prescription Continuous;CPAP    Liters per minute 2    Home Exercise Oxygen Prescription None    Home Resting Oxygen  Prescription None    Compliance with Home Oxygen Use Yes      Initial 6 min Walk   Oxygen Used None      Program Oxygen Prescription   Program Oxygen Prescription None      Intervention   Short Term Goals To learn and exhibit compliance with exercise, home and travel O2 prescription;To learn and understand importance of monitoring SPO2 with pulse oximeter and demonstrate accurate use of the pulse oximeter.;To learn and understand importance of maintaining oxygen saturations>88%;To learn and demonstrate proper pursed lip breathing techniques or other breathing techniques. ;To learn and demonstrate proper use of respiratory medications    Long  Term Goals Exhibits compliance with exercise, home  and travel O2 prescription;Verbalizes importance of monitoring SPO2 with pulse  oximeter and return demonstration;Maintenance of O2 saturations>88%;Exhibits proper breathing techniques, such as pursed lip breathing or other method taught during program session;Compliance with respiratory medication;Demonstrates proper use of MDI's             Oxygen Re-Evaluation:  Oxygen Re-Evaluation     Row Name 09/24/22 1446 10/02/22 1117 10/07/22 1341 10/14/22 1121 10/23/22 1210     Program Oxygen Prescription   Program Oxygen Prescription None None None None --     Home Oxygen   Home Oxygen Device Home Concentrator None None None --   Sleep Oxygen Prescription Continuous;CPAP Continuous;CPAP Continuous;CPAP Continuous;CPAP --   Liters per minute 2 -- 2 2 --   Home Exercise Oxygen Prescription None None None None --   Home Resting Oxygen Prescription None None None None --   Compliance with Home Oxygen Use Yes Yes Yes Yes --     Goals/Expected Outcomes   Short Term Goals To learn and demonstrate proper pursed lip breathing techniques or other breathing techniques. ;To learn and exhibit compliance with exercise, home and travel O2 prescription;To learn and understand importance of monitoring SPO2 with  pulse oximeter and demonstrate accurate use of the pulse oximeter.;To learn and understand importance of maintaining oxygen saturations>88%;To learn and demonstrate proper use of respiratory medications To learn and demonstrate proper pursed lip breathing techniques or other breathing techniques. ;To learn and exhibit compliance with exercise, home and travel O2 prescription;To learn and understand importance of monitoring SPO2 with pulse oximeter and demonstrate accurate use of the pulse oximeter.;To learn and understand importance of maintaining oxygen saturations>88%;To learn and demonstrate proper use of respiratory medications To learn and demonstrate proper pursed lip breathing techniques or other breathing techniques. ;To learn and exhibit compliance with exercise, home and travel O2 prescription;To learn and understand importance of monitoring SPO2 with pulse oximeter and demonstrate accurate use of the pulse oximeter.;To learn and understand importance of maintaining oxygen saturations>88%;To learn and demonstrate proper use of respiratory medications To learn and demonstrate proper pursed lip breathing techniques or other breathing techniques. ;To learn and exhibit compliance with exercise, home and travel O2 prescription;To learn and understand importance of monitoring SPO2 with pulse oximeter and demonstrate accurate use of the pulse oximeter.;To learn and understand importance of maintaining oxygen saturations>88%;To learn and demonstrate proper use of respiratory medications To learn and demonstrate proper pursed lip breathing techniques or other breathing techniques.    Long  Term Goals Exhibits proper breathing techniques, such as pursed lip breathing or other method taught during program session;Exhibits compliance with exercise, home  and travel O2 prescription;Verbalizes importance of monitoring SPO2 with pulse oximeter and return demonstration;Maintenance of O2 saturations>88%;Compliance with  respiratory medication;Demonstrates proper use of MDI's Exhibits proper breathing techniques, such as pursed lip breathing or other method taught during program session;Exhibits compliance with exercise, home  and travel O2 prescription;Verbalizes importance of monitoring SPO2 with pulse oximeter and return demonstration;Maintenance of O2 saturations>88%;Compliance with respiratory medication;Demonstrates proper use of MDI's Exhibits proper breathing techniques, such as pursed lip breathing or other method taught during program session;Exhibits compliance with exercise, home  and travel O2 prescription;Verbalizes importance of monitoring SPO2 with pulse oximeter and return demonstration;Maintenance of O2 saturations>88%;Compliance with respiratory medication;Demonstrates proper use of MDI's Exhibits proper breathing techniques, such as pursed lip breathing or other method taught during program session;Exhibits compliance with exercise, home  and travel O2 prescription;Verbalizes importance of monitoring SPO2 with pulse oximeter and return demonstration;Maintenance of O2 saturations>88%;Compliance with respiratory medication;Demonstrates proper use of MDI's Exhibits  proper breathing techniques, such as pursed lip breathing or other method taught during program session   Comments Reviewed PLB technique with pt.  Talked about how it works and it's importance in maintaining their exercise saturations. Talked to patient about triggers today.  She is also good about using her PLB to help with breath control.  She is compliant with her inhalers.  She will use her abuterol inhaler on bad breathing days.  She does not have a spacer, so we talked about trying to get her one to use at home. Talked to patient about triggers today.  She is also good about using her PLB to help with breath control.  She is compliant with her inhalers.  She will use her abuterol inhaler on bad breathing days.  She does not have a spacer, so we  talked about trying to get her one to use at home. Paula [name] is doing well in rehab. She is compliant with her CPAP and it helps her sleep through the night with it.  She feels good with it.  She continues to work on using her PLB to help with SOB.  She has been using her inhaler more during hot season as the heat irritates her breathing more. She now has a spacer and we reviewed how to use it. Diaphragmatic and PLB breathing explained and performed with patient. Patient has a better understanding of how to do these exercises to help with breathing performance and relaxation. Patient performed breathing techniques adequately and to practice further at home.   Goals/Expected Outcomes Short: Become more profiecient at using PLB.   Long: Become independent at using PLB. Short: Get spacer to use with inhalers  Long; Continuet use PLB and stay compliant with inhaler Short: Get spacer to use with inhalers  Long; Continuet use PLB and stay compliant with inhaler Short: Continue to work on using PLB routinely Long: Conitnued compliance Short: practice PLB and diaphragmatic breathing at home. Long: Use PLB and diaphragmatic breathing independently    Row Name 11/18/22 1132 12/11/22 1145           Program Oxygen Prescription   Program Oxygen Prescription None None        Home Oxygen   Home Oxygen Device None None      Sleep Oxygen Prescription Continuous;CPAP Continuous;CPAP      Liters per minute 2 --      Home Exercise Oxygen Prescription None None      Home Resting Oxygen Prescription None None      Compliance with Home Oxygen Use Yes Yes        Goals/Expected Outcomes   Short Term Goals To learn and demonstrate proper pursed lip breathing techniques or other breathing techniques. ;To learn and exhibit compliance with exercise, home and travel O2 prescription;To learn and understand importance of monitoring SPO2 with pulse oximeter and demonstrate accurate use of the pulse oximeter.;To learn and understand  importance of maintaining oxygen saturations>88% To learn and demonstrate proper pursed lip breathing techniques or other breathing techniques. ;To learn and exhibit compliance with exercise, home and travel O2 prescription;To learn and understand importance of monitoring SPO2 with pulse oximeter and demonstrate accurate use of the pulse oximeter.;To learn and understand importance of maintaining oxygen saturations>88%      Long  Term Goals Exhibits compliance with exercise, home  and travel O2 prescription;Verbalizes importance of monitoring SPO2 with pulse oximeter and return demonstration;Maintenance of O2 saturations>88%;Exhibits proper breathing techniques, such as pursed lip breathing or other  method taught during program session;Compliance with respiratory medication Exhibits compliance with exercise, home  and travel O2 prescription;Verbalizes importance of monitoring SPO2 with pulse oximeter and return demonstration;Maintenance of O2 saturations>88%;Exhibits proper breathing techniques, such as pursed lip breathing or other method taught during program session;Compliance with respiratory medication      Comments Paula [name] is doing well in rehab.  She continues to stay compliant with CPAP and oxygen at night. She uses her PLB during exericse and when SOB.  Her saturations are doing well overall.  She still gets SOB, but feels like she can manage it better now. Paula [name] continues to do well in rehab. She is staying compliant with her CPAP at night. SHe uses PLB when she feels SOB and it helps bring her O2 back up. She does feel like her SOB has improved since starting rehab.      Goals/Expected Outcomes ShortL Continue to work on PLB Long: Continue to stay compliant ShortL Continue to work on PLB Long: Continue to stay compliant               Oxygen Discharge (Final Oxygen Re-Evaluation):  Oxygen Re-Evaluation - 12/11/22 1145       Program Oxygen Prescription   Program Oxygen Prescription None       Home Oxygen   Home Oxygen Device None    Sleep Oxygen Prescription Continuous;CPAP    Home Exercise Oxygen Prescription None    Home Resting Oxygen Prescription None    Compliance with Home Oxygen Use Yes      Goals/Expected Outcomes   Short Term Goals To learn and demonstrate proper pursed lip breathing techniques or other breathing techniques. ;To learn and exhibit compliance with exercise, home and travel O2 prescription;To learn and understand importance of monitoring SPO2 with pulse oximeter and demonstrate accurate use of the pulse oximeter.;To learn and understand importance of maintaining oxygen saturations>88%    Long  Term Goals Exhibits compliance with exercise, home  and travel O2 prescription;Verbalizes importance of monitoring SPO2 with pulse oximeter and return demonstration;Maintenance of O2 saturations>88%;Exhibits proper breathing techniques, such as pursed lip breathing or other method taught during program session;Compliance with respiratory medication    Comments Paula [name] continues to do well in rehab. She is staying compliant with her CPAP at night. SHe uses PLB when she feels SOB and it helps bring her O2 back up. She does feel like her SOB has improved since starting rehab.    Goals/Expected Outcomes ShortL Continue to work on PLB Long: Continue to stay compliant             Initial Exercise Prescription:  Initial Exercise Prescription - 09/24/22 1400       Date of Initial Exercise RX and Referring Provider   Date 09/24/22    Referring Provider Warnell Forester MD      Oxygen   Maintain Oxygen Saturation 88% or higher      Treadmill   MPH 1.5    Grade 0.5    Minutes 17    METs 2.25      NuStep   Level 1    SPM 80    Minutes 17    METs 1.5      REL-XR   Level 1    Watts 20    Speed 50    Minutes 15    METs 1.5      Prescription Details   Frequency (times per week) 3    Duration Progress to 30 minutes of continuous aerobic  without  signs/symptoms of physical distress      Intensity   THRR 40-80% of Max Heartrate 106-133    Ratings of Perceived Exertion 11-13    Perceived Dyspnea 0-4      Progression   Progression Continue to progress workloads to maintain intensity without signs/symptoms of physical distress.      Resistance Training   Training Prescription Yes    Weight 3 lb    Reps 10-15             Perform Capillary Blood Glucose checks as needed.  Exercise Prescription Changes:   Exercise Prescription Changes     Row Name 09/24/22 1400 10/07/22 1300 10/14/22 1100 10/21/22 1300 11/06/22 1200     Response to Exercise   Blood Pressure (Admit) 128/70 140/62 -- 116/72 130/80   Blood Pressure (Exercise) 204/112 158/82 -- 112/70 --   Blood Pressure (Exit) 134/74 162/84 -- 112/64 124/76   Heart Rate (Admit) 78 bpm 87 bpm -- 71 bpm 63 bpm   Heart Rate (Exercise) 116 bpm 87 bpm -- 88 bpm 84 bpm   Heart Rate (Exit) 87 bpm 71 bpm -- 71 bpm 68 bpm   Oxygen Saturation (Admit) 96 % 92 % -- 97 % 94 %   Oxygen Saturation (Exercise) 88 % 93 % -- 93 % 91 %   Oxygen Saturation (Exit) 91 % 96 % -- 96 % 95 %   Rating of Perceived Exertion (Exercise) 13 13 -- 12 13   Perceived Dyspnea (Exercise) 16 1 -- 1 3   Symptoms hip pain 7/10 -- -- -- --   Comments walk test orientation -- -- -- --   Duration -- Continue with 30 min of aerobic exercise without signs/symptoms of physical distress. -- Continue with 30 min of aerobic exercise without signs/symptoms of physical distress. Continue with 30 min of aerobic exercise without signs/symptoms of physical distress.   Intensity -- THRR unchanged -- THRR unchanged THRR unchanged     Progression   Progression -- Continue to progress workloads to maintain intensity without signs/symptoms of physical distress. -- Continue to progress workloads to maintain intensity without signs/symptoms of physical distress. Continue to progress workloads to maintain intensity without  signs/symptoms of physical distress.     Resistance Training   Training Prescription -- Yes -- Yes Yes   Weight -- 3 -- 3 3   Reps -- 10-15 -- 10-15 10-15     NuStep   Level -- 1 -- 2 3   SPM -- 110 -- 116 99   Minutes -- 15 -- 15 15   METs -- 2 -- 2 2     REL-XR   Level -- 1 -- 2 3   Speed -- 48 -- 50 53   Minutes -- 15 -- 15 15   METs -- 1.5 -- 1.8 2.3     Home Exercise Plan   Plans to continue exercise at -- -- Home (comment)  walking, weights, videos -- --   Frequency -- -- Add 1 additional day to program exercise sessions. -- --   Initial Home Exercises Provided -- -- 10/14/22 -- --     Oxygen   Maintain Oxygen Saturation -- 88% or higher -- 88% or higher 88% or higher    Row Name 11/18/22 1100 12/02/22 1300 12/16/22 0917         Response to Exercise   Blood Pressure (Admit) -- 129/60 128/60     Blood Pressure (Exercise) -- -- 126/60  Blood Pressure (Exit) -- 128/68 118/62     Heart Rate (Admit) -- 70 bpm 77 bpm     Heart Rate (Exercise) -- 89 bpm 88 bpm     Heart Rate (Exit) -- 71 bpm 70 bpm     Oxygen Saturation (Admit) -- 95 % 94 %     Oxygen Saturation (Exercise) -- 93 % 92 %     Oxygen Saturation (Exit) -- 95 % 95 %     Rating of Perceived Exertion (Exercise) -- 12 12     Perceived Dyspnea (Exercise) -- 2 2     Duration -- Continue with 30 min of aerobic exercise without signs/symptoms of physical distress. Continue with 30 min of aerobic exercise without signs/symptoms of physical distress.     Intensity -- THRR unchanged THRR unchanged       Progression   Progression -- Continue to progress workloads to maintain intensity without signs/symptoms of physical distress. Continue to progress workloads to maintain intensity without signs/symptoms of physical distress.       Resistance Training   Training Prescription -- Yes Yes     Weight -- 3 3 lbs     Reps -- 10-15 10-15       NuStep   Level -- 4 4     SPM -- 106 120     Minutes -- 15 15     METs  -- 2.1 2.2       REL-XR   Level -- 3 4     Speed -- 52 46     Minutes -- 15 15     METs -- 2.1 1.6       Home Exercise Plan   Plans to continue exercise at Home (comment)  walking, weights, videos Home (comment) Home (comment)     Frequency Add 1 additional day to program exercise sessions. Add 2 additional days to program exercise sessions. Add 2 additional days to program exercise sessions.     Initial Home Exercises Provided 10/14/22 -- --       Oxygen   Maintain Oxygen Saturation -- 88% or higher 88% or higher              Exercise Comments:   Exercise Goals and Review:   Exercise Goals     Row Name 09/24/22 1445 10/07/22 1334           Exercise Goals   Increase Physical Activity Yes Yes      Intervention Provide advice, education, support and counseling about physical activity/exercise needs.;Develop an individualized exercise prescription for aerobic and resistive training based on initial evaluation findings, risk stratification, comorbidities and participant's personal goals. Provide advice, education, support and counseling about physical activity/exercise needs.;Develop an individualized exercise prescription for aerobic and resistive training based on initial evaluation findings, risk stratification, comorbidities and participant's personal goals.      Expected Outcomes Short Term: Attend rehab on a regular basis to increase amount of physical activity.;Long Term: Add in home exercise to make exercise part of routine and to increase amount of physical activity.;Long Term: Exercising regularly at least 3-5 days a week. Short Term: Attend rehab on a regular basis to increase amount of physical activity.;Long Term: Add in home exercise to make exercise part of routine and to increase amount of physical activity.;Long Term: Exercising regularly at least 3-5 days a week.      Increase Strength and Stamina Yes Yes      Intervention Provide advice, education, support and  counseling about physical activity/exercise needs.;Develop an individualized exercise prescription for aerobic and resistive training based on initial evaluation findings, risk stratification, comorbidities and participant's personal goals. Provide advice, education, support and counseling about physical activity/exercise needs.;Develop an individualized exercise prescription for aerobic and resistive training based on initial evaluation findings, risk stratification, comorbidities and participant's personal goals.      Expected Outcomes Short Term: Increase workloads from initial exercise prescription for resistance, speed, and METs.;Short Term: Perform resistance training exercises routinely during rehab and add in resistance training at home;Long Term: Improve cardiorespiratory fitness, muscular endurance and strength as measured by increased METs and functional capacity ( ) Short Term: Increase workloads from initial exercise prescription for resistance, speed, and METs.;Short Term: Perform resistance training exercises routinely during rehab and add in resistance training at home;Long Term: Improve cardiorespiratory fitness, muscular endurance and strength as measured by increased METs and functional capacity ( )      Able to understand and use rate of perceived exertion (RPE) scale Yes Yes      Intervention Provide education and explanation on how to use RPE scale Provide education and explanation on how to use RPE scale      Expected Outcomes Short Term: Able to use RPE daily in rehab to express subjective intensity level;Long Term:  Able to use RPE to guide intensity level when exercising independently Short Term: Able to use RPE daily in rehab to express subjective intensity level;Long Term:  Able to use RPE to guide intensity level when exercising independently      Able to understand and use Dyspnea scale Yes Yes      Intervention Provide education and explanation on how to use Dyspnea scale  Provide education and explanation on how to use Dyspnea scale      Expected Outcomes Short Term: Able to use Dyspnea scale daily in rehab to express subjective sense of shortness of breath during exertion;Long Term: Able to use Dyspnea scale to guide intensity level when exercising independently Short Term: Able to use Dyspnea scale daily in rehab to express subjective sense of shortness of breath during exertion;Long Term: Able to use Dyspnea scale to guide intensity level when exercising independently      Knowledge and understanding of Target Heart Rate Range (THRR) Yes Yes      Intervention Provide education and explanation of THRR including how the numbers were predicted and where they are located for reference Provide education and explanation of THRR including how the numbers were predicted and where they are located for reference      Expected Outcomes Short Term: Able to state/look up THRR;Short Term: Able to use daily as guideline for intensity in rehab;Long Term: Able to use THRR to govern intensity when exercising independently Short Term: Able to state/look up THRR;Short Term: Able to use daily as guideline for intensity in rehab;Long Term: Able to use THRR to govern intensity when exercising independently      Able to check pulse independently Yes Yes      Intervention Provide education and demonstration on how to check pulse in carotid and radial arteries.;Review the importance of being able to check your own pulse for safety during independent exercise Provide education and demonstration on how to check pulse in carotid and radial arteries.;Review the importance of being able to check your own pulse for safety during independent exercise      Expected Outcomes Short Term: Able to explain why pulse checking is important during independent exercise;Long Term: Able to check pulse independently  and accurately Short Term: Able to explain why pulse checking is important during independent  exercise;Long Term: Able to check pulse independently and accurately      Understanding of Exercise Prescription Yes Yes      Intervention Provide education, explanation, and written materials on patient's individual exercise prescription Provide education, explanation, and written materials on patient's individual exercise prescription      Expected Outcomes Short Term: Able to explain program exercise prescription;Long Term: Able to explain home exercise prescription to exercise independently Short Term: Able to explain program exercise prescription;Long Term: Able to explain home exercise prescription to exercise independently               Exercise Goals Re-Evaluation :  Exercise Goals Re-Evaluation     Row Name 09/24/22 1445 10/07/22 1334 10/14/22 1134 10/21/22 1346 11/07/22 0913     Exercise Goal Re-Evaluation   Exercise Goals Review Able to understand and use rate of perceived exertion (RPE) scale;Able to understand and use Dyspnea scale Increase Physical Activity;Increase Strength and Stamina;Able to understand and use rate of perceived exertion (RPE) scale;Able to understand and use Dyspnea scale;Knowledge and understanding of Target Heart Rate Range (THRR);Able to check pulse independently;Understanding of Exercise Prescription Increase Physical Activity;Increase Strength and Stamina;Able to understand and use rate of perceived exertion (RPE) scale;Able to understand and use Dyspnea scale;Knowledge and understanding of Target Heart Rate Range (THRR);Able to check pulse independently;Understanding of Exercise Prescription -- Increase Physical Activity;Understanding of Exercise Prescription;Increase Strength and Stamina   Comments Reviewed RPE  and dyspnea scale and program prescription with pt today.  Pt voiced understanding and was given a copy of goals to take home. Pt has comepleted 4 sessions of pulmonary rehab. She is motivated to be back in the program after having foot surgery. She  is progressing slowly. She is currently exercising a t2.0 METs on the stepper. Will continue to monitor and progress as able. Reviewed home exercise with pt today.  Pt plans to walk with dog and use weights at home for exercise.  We also talked about trying out staff videos.  She is also going to PT once a week.  Reviewed THR, pulse, RPE, sign and symptoms, pulse oximetery and when to call 911 or MD.  Also discussed weather considerations and indoor options.  Pt voiced understanding.  She is feeling better since starting rehab. She wants to continue to work on weight loss Patient is enjoying rehab. She has increased her SPM and METs on the stepper. Paula [name] has increased her levels on both the NuStep and XR to level 3. She is also working at an RPE of 13 for both. Will continue to monitor and progress asable .   Expected Outcomes Short: Use RPE daily to regulate intensity.  Long: Follow program prescription Through exercise at rehab and home, patient will achieve their goals. Short: Start to add in home exercise Long: Continue to build strength and stamina -- Short term goal: continue at workloads until RPE is an 43 then increase levels    Long term goal: continue to attend pulmonary rehab    Row Name 11/18/22 1125 12/11/22 1114 12/17/22 0918         Exercise Goal Re-Evaluation   Exercise Goals Review Increase Physical Activity;Increase Strength and Stamina;Understanding of Exercise Prescription Increase Physical Activity;Increase Strength and Stamina;Understanding of Exercise Prescription Increase Physical Activity;Increase Strength and Stamina;Understanding of Exercise Prescription     Comments Paula [name] is doing well in rehab. She missed a couple last week.  She is doing her home exercise and walking more. She has noticed that her strength and stamina are improving.  She does note that she is working hard in class as she is sore the next day. Paula [name] has been doing great with exercise. She has been dealing with  heel pain every now and then causing her not to be able to walk some days. She continues to notice an increase in her stamina and endurance. Paula [name] has been tolerating exercise well. She has been exercising at a level 4 on the NuStep over the past two weeks and has a high SPM of 120. Will talk about increasing level for next class. Will continue to monitor and progress as able     Expected Outcomes Short: Continue to add in more exercise at home Long: Continue to improve stamina Short term: continue to walk at hoome and add exercise at home   long term: continue to improve stamina Short term: increase level on NuStep   long term: continue to attend rehab              Discharge Exercise Prescription (Final Exercise Prescription Changes):  Exercise Prescription Changes - 12/16/22 0917       Response to Exercise   Blood Pressure (Admit) 128/60    Blood Pressure (Exercise) 126/60    Blood Pressure (Exit) 118/62    Heart Rate (Admit) 77 bpm    Heart Rate (Exercise) 88 bpm    Heart Rate (Exit) 70 bpm    Oxygen Saturation (Admit) 94 %    Oxygen Saturation (Exercise) 92 %    Oxygen Saturation (Exit) 95 %    Rating of Perceived Exertion (Exercise) 12    Perceived Dyspnea (Exercise) 2    Duration Continue with 30 min of aerobic exercise without signs/symptoms of physical distress.    Intensity THRR unchanged      Progression   Progression Continue to progress workloads to maintain intensity without signs/symptoms of physical distress.      Resistance Training   Training Prescription Yes    Weight 3 lbs    Reps 10-15      NuStep   Level 4    SPM 120    Minutes 15    METs 2.2      REL-XR   Level 4    Speed 46    Minutes 15    METs 1.6      Home Exercise Plan   Plans to continue exercise at Home (comment)    Frequency Add 2 additional days to program exercise sessions.      Oxygen   Maintain Oxygen Saturation 88% or higher             Nutrition:  Target Goals:  Understanding of nutrition guidelines, daily intake of sodium 1500mg , cholesterol 200mg , calories 30% from fat and 7% or less from saturated fats, daily to have 5 or more servings of fruits and vegetables.  Biometrics:    Nutrition Therapy Plan and Nutrition Goals:  Nutrition Therapy & Goals - 09/30/22 1233       Nutrition Therapy   RD appointment deferred Yes      Personal Nutrition Goals   Comments We offer  educational sessions on heart healthy nutrition with handouts.      Intervention Plan   Intervention Nutrition handout(s) given to patient.    Expected Outcomes Short Term Goal: Understand basic principles of dietary content, such as calories, fat, sodium, cholesterol and nutrients.  Nutrition Assessments:  Nutrition Assessments - 09/24/22 1447       MEDFICTS Scores   Pre Score 50            MEDIFICTS Score Key: >=70 Need to make dietary changes  40-70 Heart Healthy Diet <= 40 Therapeutic Level Cholesterol Diet   Picture Your Plate Scores: <82 Unhealthy dietary pattern with much room for improvement. 41-50 Dietary pattern unlikely to meet recommendations for good health and room for improvement. 51-60 More healthful dietary pattern, with some room for improvement.  >60 Healthy dietary pattern, although there may be some specific behaviors that could be improved.    Nutrition Goals Re-Evaluation:  Nutrition Goals Re-Evaluation     Row Name 10/14/22 1117 11/18/22 1130 12/11/22 1122         Goals   Nutrition Goal Focus on listening to hunger cues Short: Focus on hunger cues and use water Long: Continue to improve diet Healthy eating     Comment Paula [name] has noticed that she will eat and then feel hungry shortly afterwards.  We talked about listening to her hunger cues, as she used to ignore them. She is now using water to help.  She is also working on focusing on healthy eating with more fruit and vegetables.  She is also working on portion  control. Paula [name] is doing well in rehab.  She continues to work on her diet.  Her husband is not cooking healthy, but she is, so they are working on getting back to cooking healthy again. She is sitll working on listening to hunger cues and portion control. Paula [name] continues to work on her nutrion. Her husband does not cook healthy and loves to bake sweets. She does have cheat days during the weekend due to family events and partys.     Expected Outcome Short: Focus on hunger cues and use water Long: Continue to improve diet Short: COntinue to work on hunger cues Long: COnitnue to work on portion control Short: continue to choose healthy items    long term: continue to work on portion control              Nutrition Goals Discharge (Final Nutrition Goals Re-Evaluation):  Nutrition Goals Re-Evaluation - 12/11/22 1122       Goals   Nutrition Goal Healthy eating    Comment Paula [name] continues to work on her nutrion. Her husband does not cook healthy and loves to bake sweets. She does have cheat days during the weekend due to family events and partys.    Expected Outcome Short: continue to choose healthy items    long term: continue to work on portion control             Psychosocial: Target Goals: Acknowledge presence or absence of significant depression and/or stress, maximize coping skills, provide positive support system. Participant is able to verbalize types and ability to use techniques and skills needed for reducing stress and depression.  Initial Review & Psychosocial Screening:  Initial Psych Review & Screening - 09/24/22 1251       Initial Review   Current issues with Current Psychotropic Meds;Current Depression;Current Sleep Concerns;History of Depression      Family Dynamics   Good Support System? Yes   husband (doesn't help with diet), kids     Barriers   Psychosocial barriers to participate in program The patient should benefit from training in stress management and  relaxation.;Psychosocial barriers identified (see note)      Screening Interventions   Interventions  Encouraged to exercise;Provide feedback about the scores to participant;To provide support and resources with identified psychosocial needs    Expected Outcomes Short Term goal: Identification and review with participant of any Quality of Life or Depression concerns found by scoring the questionnaire.;Long Term Goal: Stressors or current issues are controlled or eliminated.;Long Term goal: The participant improves quality of Life and PHQ9 Scores as seen by post scores and/or verbalization of changes;Short Term goal: Utilizing psychosocial counselor, staff and physician to assist with identification of specific Stressors or current issues interfering with healing process. Setting desired goal for each stressor or current issue identified.             Quality of Life Scores:  Quality of Life - 09/24/22 1503       Quality of Life   Select Quality of Life      Quality of Life Scores   Health/Function Pre 13.41 %    Socioeconomic Pre 21.08 %    Psych/Spiritual Pre 17.43 %    Family Pre 24 %    GLOBAL Pre 16.94 %            Scores of 19 and below usually indicate a poorer quality of life in these areas.  A difference of  2-3 points is a clinically meaningful difference.  A difference of 2-3 points in the total score of the Quality of Life Index has been associated with significant improvement in overall quality of life, self-image, physical symptoms, and general health in studies assessing change in quality of life.   PHQ-9: Review Flowsheet  More data exists      12/16/2022 11/11/2022 10/14/2022 09/24/2022 04/30/2022  Depression screen PHQ 2/9  Decreased Interest 1 1 1 1 1   Down, Depressed, Hopeless 1 1 0 1 1  PHQ - 2 Score 2 2 1 2 2   Altered sleeping 0 2 0 0 1  Tired, decreased energy 3 3 2 2 2   Change in appetite 0 0 1 2 2   Feeling bad or failure about yourself  0 1 1 0 1  Trouble  concentrating 0 0 0 0 0  Moving slowly or fidgety/restless 1 0 0 1 0  Suicidal thoughts 0 0 0 0 0  PHQ-9 Score 6 8 5 7 8   Difficult doing work/chores Somewhat difficult Somewhat difficult Somewhat difficult Very difficult Not difficult at all    Details           Interpretation of Total Score  Total Score Depression Severity:  1-4 = Minimal depression, 5-9 = Mild depression, 10-14 = Moderate depression, 15-19 = Moderately severe depression, 20-27 = Severe depression   Psychosocial Evaluation and Intervention:  Psychosocial Evaluation - 09/24/22 1430       Psychosocial Evaluation & Interventions   Interventions Stress management education;Encouraged to exercise with the program and follow exercise prescription    Comments Paula [name] is coming into pulmonary rehab for COPD.  She was not able to finish program last time due to a foot surgery but knew it made a big difference for her.  She is eager to return and get moving again.  She does not have any barriers to getting to rehab other than multiple doctor's appointments which we discussed making up those up at end.  She lives with her husband who is the primary cook, which causes her stress.  Her husband and cousins are her support system.  She is a retired Archivist.  Paula [name] has a history of depression and is currently  on meds but feels well managed at this time.  Her PHQ indicated mil depression (6) but she feels it comes from not being able to do everything she wants to currently.  She enjoys working in her garden, but not able to do as much curerntly and would like to be to get back in the garden.  She really wants to be able to breathe better and increase her stamina in rehab.  She would really like to work on weight loss as part of being able to move around better.  Her biggest goal is to be able to dance at her niece's wedding in October!  She wants to enjoy and have fun.    Expected Outcomes Short: Attend rehab to start to build stamina and  improve breathing Long: Continue to find coping skills to help manage depression and continue to build stamina    Continue Psychosocial Services  Follow up required by staff             Psychosocial Re-Evaluation:  Psychosocial Re-Evaluation     Row Name 09/30/22 1234 10/14/22 1114 11/11/22 1132 11/20/22 1110 12/11/22 1118     Psychosocial Re-Evaluation   Current issues with Current Depression;Current Sleep Concerns;Current Psychotropic Meds Current Depression;Current Sleep Concerns;Current Psychotropic Meds Current Depression;Current Sleep Concerns;Current Psychotropic Meds Current Depression;Current Sleep Concerns;Current Psychotropic Meds Current Depression;Current Sleep Concerns;Current Psychotropic Meds   Comments Patient is new to the program. She has completed 2 sessions. She continues to have no psychosocial barriers identified. Her depressions continues to be managed with Sertraline. She has participated in the program before and enjoys the sessions and demonstrates an interest in improving her health. Her personal goals for the program breathe better; increase her stamina; lose weight; and be able to dance at her neice's wedding on October. We will continue to monitor her progress. Camielle is doing well in rehab.  She is feeling better overall.  Her PHQ has improved from 7 down to 5.  We will continue to monitor her PHQ.  She still struggles with sleep and will feel bad about herself some times, but that is improving.  She feels that her mood is stable and her Zoloft is working for her. We talked about the exercise helping as well. Reviewed patient health questionnaire (PHQ-9) with patient for follow up. Previously, patients score indicated signs/symptoms of depression.  Reviewed to see if patient is improving symptom wise while in program.  Score declined and patient states that it is because they have not been sleeping well again.  She does not nap and goes through periods when she has  times of good sleep and some of bad, so she is currently in her rougher pattern.  This leaves her feeling tired and worn up.  She also gets frustrated that she is not further along and not able to go and do everything she normally would have in the past. Pt is taking Sertraline as prescribed to help with depression.  Pt states that her sleeping has improved recently;however, she is still frustrated that her energy level is not what it was before she had surgery. Paula [name] stated that her sleep has improved. She stated that she is still frustrated with her energy levels and gets fatigued quickly when walking aorund.   Expected Outcomes Pt's depression and sleep will continue to be managed, and she will have no other identifiable psychosocial issues. Short: Continue to exercise for mental boost Long: continue to be compliant with meds. short: Continue to work on sleep  Long: Continue to stay postive and exercise for mental boost. Short term:  Continue to get enough sleep so that your energy is improved to walk more       Long term:  Continue to attend PR to increase her strength/stamina Short term: include walking in home exercise a little at a time to build endurance   long term: continue to exercise for improved energy and happiness   Interventions Stress management education;Relaxation education Encouraged to attend Pulmonary Rehabilitation for the exercise;Stress management education Encouraged to attend Pulmonary Rehabilitation for the exercise;Stress management education Stress management education;Relaxation education;Encouraged to attend Pulmonary Rehabilitation for the exercise Stress management education;Relaxation education;Encouraged to attend Pulmonary Rehabilitation for the exercise   Continue Psychosocial Services  No Follow up required Follow up required by staff Follow up required by staff Follow up required by staff Follow up required by staff    Row Name 12/16/22 1112             Psychosocial  Re-Evaluation   Comments Reviewed patient health questionnaire (PHQ-9) with patient for follow up. Previously, patients score indicated signs/symptoms of depression.  Reviewed to see if patient is improving symptom wise while in program.  Score improved and patient states that it is because they have been moving more and feeling better.       Expected Outcomes Short: Continue to attend [facility] regularly for regular exercise and social engagement. Long: Continue to improve symptoms and manage a positive mental state.       Interventions Stress management education;Encouraged to attend Pulmonary Rehabilitation for the exercise       Continue Psychosocial Services  Follow up required by staff                Psychosocial Discharge (Final Psychosocial Re-Evaluation):  Psychosocial Re-Evaluation - 12/16/22 1112       Psychosocial Re-Evaluation   Comments Reviewed patient health questionnaire (PHQ-9) with patient for follow up. Previously, patients score indicated signs/symptoms of depression.  Reviewed to see if patient is improving symptom wise while in program.  Score improved and patient states that it is because they have been moving more and feeling better.    Expected Outcomes Short: Continue to attend [facility] regularly for regular exercise and social engagement. Long: Continue to improve symptoms and manage a positive mental state.    Interventions Stress management education;Encouraged to attend Pulmonary Rehabilitation for the exercise    Continue Psychosocial Services  Follow up required by staff              Education: Education Goals: Education classes will be provided on a weekly basis, covering required topics. Participant will state understanding/return demonstration of topics presented.  Learning Barriers/Preferences:  Learning Barriers/Preferences - 09/24/22 1248       Learning Barriers/Preferences   Learning Barriers Sight   glasses    Learning Preferences Written Material;Skilled Demonstration             Education Topics: How Lungs Work and Diseases: - Discuss the anatomy of the lungs and diseases that can affect the lungs, such as COPD. Flowsheet Row PULMONARY REHAB OTHER RESPIRATORY from 04/25/2022 in [place] [facility]  Date 12/27/21  Educator DF  Instruction Review Code 1- Verbalizes Understanding       Exercise: -Discuss the importance of exercise, FITT principles of exercise, normal and abnormal responses to exercise, and how to exercise safely.   Environmental Irritants: -Discuss types of environmental irritants and how to limit exposure to environmental irritants.  Flowsheet Row PULMONARY REHAB OTHER RESPIRATORY from 12/25/2022 in [place] [facility]  Date 10/02/22  Educator [facility]  Instruction Review Code 1- Verbalizes Understanding       Meds/Inhalers and oxygen: - Discuss respiratory medications, definition of an inhaler and oxygen, and the proper way to use an inhaler and oxygen. Flowsheet Row PULMONARY REHAB OTHER RESPIRATORY from 12/25/2022 in [place] [facility]  Date 10/09/22  Educator [facility]       Energy Saving Techniques: - Discuss methods to conserve energy and decrease shortness of breath when performing activities of daily living.  Flowsheet Row PULMONARY REHAB OTHER RESPIRATORY from 12/25/2022 in [place] [facility]  Date 10/16/22  Educator Handout  Instruction Review Code 1- Verbalizes Understanding       Bronchial Hygiene / Breathing Techniques: - Discuss breathing mechanics, pursed-lip breathing technique,  proper posture, effective ways to clear airways, and other functional breathing techniques Flowsheet Row PULMONARY REHAB OTHER RESPIRATORY from 12/25/2022 in [place] [facility]  Date 10/23/22  Educator [facility]  Instruction Review Code 1- Personnel officer: - Provides group  verbal and written instruction about the health risks of elevated stress, cause of high stress, and healthy ways to reduce stress.   Nutrition I: Fats: - Discuss the types of cholesterol, what cholesterol does to the body, and how cholesterol levels can be controlled. Flowsheet Row PULMONARY REHAB OTHER RESPIRATORY from 12/25/2022 in [place] [facility]  Date 11/06/22  Educator [facility]  Instruction Review Code 1- Verbalizes Understanding       Nutrition II: Labels: -Discuss the different components of food labels and how to read food labels. Flowsheet Row PULMONARY REHAB OTHER RESPIRATORY from 12/25/2022 in [place] [facility]  Date 11/06/22  Educator [facility]  Instruction Review Code 1- Verbalizes Understanding       Respiratory Infections: - Discuss the signs and symptoms of respiratory infections, ways to prevent respiratory infections, and the importance of seeking medical treatment when having a respiratory infection.   Stress I: Signs and Symptoms: - Discuss the causes of stress, how stress may lead to anxiety and depression, and ways to limit stress. Flowsheet Row PULMONARY REHAB OTHER RESPIRATORY from 04/25/2022 in [place] [facility]  Date 03/07/22  Educator DF  Instruction Review Code 2- Demonstrated Understanding       Stress II: Relaxation: -Discuss relaxation techniques to limit stress. Flowsheet Row PULMONARY REHAB OTHER RESPIRATORY from 04/25/2022 in [place] [facility]  Date 12/13/21  Educator DM  Instruction Review Code 1- Verbalizes Understanding       Oxygen for Home/Travel: - Discuss how to prepare for travel when on oxygen and proper ways to transport and store oxygen to ensure safety. Flowsheet Row PULMONARY REHAB OTHER RESPIRATORY from 04/25/2022 in [place] [facility]  Date 12/20/21  Educator DF  Instruction Review Code 1- Verbalizes Understanding       Knowledge Questionnaire Score:   Knowledge Questionnaire Score - 09/24/22 1452       Knowledge Questionnaire Score   Pre Score 16/18   missed O2 safety and saturations            Core Components/Risk Factors/Patient Goals at Admission:  Personal Goals and Risk Factors at Admission - 09/24/22 1255       Core Components/Risk Factors/Patient Goals on Admission    Weight Management Yes;Obesity;Weight Loss    Intervention Weight Management: Provide education and appropriate resources to help participant work on and  attain dietary goals.;Weight Management/Obesity: Establish reasonable short term and long term weight goals.;Obesity: Provide education and appropriate resources to help participant work on and attain dietary goals.    Admit Weight 282 lb 6.4 oz (128.1 kg)    Goal Weight: Short Term 277 lb (125.6 kg)    Goal Weight: Long Term 272 lb (123.4 kg)    Expected Outcomes Short Term: Continue to assess and modify interventions until short term weight is achieved;Long Term: Adherence to nutrition and physical activity/exercise program aimed toward attainment of established weight goal;Weight Loss: Understanding of general recommendations for a balanced deficit meal plan, which promotes 1-2 lb weight loss per week and includes a negative energy balance of [phone] kcal/d;Understanding recommendations for meals to include 15-35% energy as protein, 25-35% energy from fat, 35-60% energy from carbohydrates, less than 200mg  of dietary cholesterol, 20-35 gm of total fiber daily;Understanding of distribution of calorie intake throughout the day with the consumption of 4-5 meals/snacks    Improve shortness of breath with ADL's Yes    Intervention Provide education, individualized exercise plan and daily activity instruction to help decrease symptoms of SOB with activities of daily living.    Expected Outcomes Short Term: Improve cardiorespiratory fitness to achieve a reduction of symptoms when performing ADLs;Long Term: Be able to  perform more ADLs without symptoms or delay the onset of symptoms    Increase knowledge of respiratory medications and ability to use respiratory devices properly  Yes    Intervention Provide education and demonstration as needed of appropriate use of medications, inhalers, and oxygen therapy.    Expected Outcomes Short Term: Achieves understanding of medications use. Understands that oxygen is a medication prescribed by physician. Demonstrates appropriate use of inhaler and oxygen therapy.;Long Term: Maintain appropriate use of medications, inhalers, and oxygen therapy.    Hypertension Yes    Intervention Provide education on lifestyle modifcations including regular physical activity/exercise, weight management, moderate sodium restriction and increased consumption of fresh fruit, vegetables, and low fat dairy, alcohol moderation, and smoking cessation.;Monitor prescription use compliance.    Expected Outcomes Short Term: Continued assessment and intervention until BP is < 140/66mm HG in hypertensive participants. < 130/74mm HG in hypertensive participants with diabetes, heart failure or chronic kidney disease.;Long Term: Maintenance of blood pressure at goal levels.             Core Components/Risk Factors/Patient Goals Review:   Goals and Risk Factor Review     Row Name 09/30/22 1237 10/14/22 1120 11/18/22 1126 12/11/22 1125       Core Components/Risk Factors/Patient Goals Review   Personal Goals Review Weight Management/Obesity;Increase knowledge of respiratory medications and ability to use respiratory devices properly.;Improve shortness of breath with ADL's;Other;Hypertension Weight Management/Obesity;Increase knowledge of respiratory medications and ability to use respiratory devices properly.;Improve shortness of breath with ADL's;Other;Hypertension Weight Management/Obesity;Increase knowledge of respiratory medications and ability to use respiratory devices properly.;Improve shortness  of breath with ADL's;Hypertension Weight Management/Obesity;Increase knowledge of respiratory medications and ability to use respiratory devices properly.;Improve shortness of breath with ADL's;Hypertension    Review Patient was referred to PR with COPD. She has completed 1 session. Her current weight is 282.4 lbs up 0.4 lbs since her initial visit. She exercises on RA with O2 saturations at 93%. Her blood pressure is hypertensive today. We will monitor her progress in the program. Titilayo is doing well in rehab. Her weight is holding steady.  She is pleased that it is not going up.  We have not started to add  in home exercise yet, but we talked about how that will help as well.  She is doing better with her breathing for most part, however, heat still gets to her.   She is doing well with meds and will use her inhaler more on bad breathing days. Paula [name] is doing well in rehab. She had a heart follow up last week.  They want her to start to wear compression socks to help with circulation.  She also had a foot follow up and they said she is continuing to make progress, but want to continue to see her in follow up.  Her weight is still yoyoing but steady overall.  Her breathing is still bothering her with the heat.  Her pressrues are doing well. Paula [name] continues to do well in rehab. She is still dealing with heel pain from her past surgery and scare tissue. She is continuing to watch her weight by trying to watch what she eats. She does have breating still bothering her. She was shopping with her graddaughter during a humid day and did get very SOB and had to take muliple breaks and then family go home due to being very tired.    Expected Outcomes Patient will complete the program meeting both personal and program goals. Short: Conitnue to work on breathing Long: Conitnue to work on [employer] loss Short: Conitnue to work on weight loss and try compression socks Long: continue to montior risk factors. Short: Conitnue to work  on weight loss and try compression socks Long: continue to montior risk factors.             Core Components/Risk Factors/Patient Goals at Discharge (Final Review):   Goals and Risk Factor Review - 12/11/22 1125       Core Components/Risk Factors/Patient Goals Review   Personal Goals Review Weight Management/Obesity;Increase knowledge of respiratory medications and ability to use respiratory devices properly.;Improve shortness of breath with ADL's;Hypertension    Review Paula [name] continues to do well in rehab. She is still dealing with heel pain from her past surgery and scare tissue. She is continuing to watch her weight by trying to watch what she eats. She does have breating still bothering her. She was shopping with her graddaughter during a humid day and did get very SOB and had to take muliple breaks and then family go home due to being very tired.    Expected Outcomes Short: Conitnue to work on weight loss and try compression socks Long: continue to montior risk factors.             ITP Comments:  ITP Comments     Row Name 09/24/22 1419 10/09/22 1249 11/06/22 0833 12/04/22 0829 01/01/23 0848   ITP Comments Pt attended orientation today for Pulmonary Rehab. Completed and gym orientation. Documentation for diagnosis can be found in CE office visit 08/05/22.  Initial ITP created and sent for review to Dr. York Grice, Medical Director. Pt service time 1225-1330. 30 day review completed. ITP sent to Dr.Jehanzeb Memon, Medical Director of  Pulmonary Rehab. Continue with ITP unless changes are made by physician. 30 day review completed. ITP sent to Dr.Jehanzeb Memon, Medical Director of  Pulmonary Rehab. Continue with ITP unless changes are made by physician. 30 day review completed. ITP sent to Dr.Jehanzeb Memon, Medical Director of  Pulmonary Rehab. Continue with ITP unless changes are made by physician. 30 day review completed. ITP sent to Dr.Jehanzeb Memon, Medical Director of   Pulmonary Rehab. Continue with ITP unless changes  are made by physician.            Comments: 30 day review

## 2023-01-03 ENCOUNTER — Encounter (HOSPITAL_COMMUNITY): Payer: Medicare Other

## 2023-01-06 ENCOUNTER — Encounter (HOSPITAL_COMMUNITY)
Admission: RE | Admit: 2023-01-06 | Discharge: 2023-01-06 | Disposition: A | Discharge status: Home / Self Care | Payer: Medicare Other | Source: Ambulatory Visit | Attending: Internal Medicine

## 2023-01-06 DIAGNOSIS — J449 Chronic obstructive pulmonary disease, unspecified: Secondary | ICD-10-CM

## 2023-01-06 NOTE — Progress Notes (Signed)
Daily Session Note  Patient Details  Name: Paula [name] MRN: [id number] Date of Birth: [birth year] Referring Provider:   Flowsheet Row PULMONARY REHAB OTHER RESP ORIENTATION from 09/24/2022 in [facility]  Referring Provider Warnell Forester MD       Encounter Date: 01/06/2023  Check In:  Session Check In - 01/06/23 1045       Check-In   Supervising physician immediately available to respond to emergencies See telemetry face sheet for immediately available ER MD    Location [facility]    Staff Present Rodena Medin, RN, BSN;Heather Fredric Mare, BS, Exercise Physiologist    Virtual Visit No    Medication changes reported     No    Fall or balance concerns reported    No    Warm-up and Cool-down Performed on first and last piece of equipment    Resistance Training Performed Yes    VAD Patient? No    PAD/SET Patient? No      Pain Assessment   Currently in Pain? No/denies    Pain Score 0-No pain    Multiple Pain Sites No             Capillary Blood Glucose: No results found for this or any previous visit (from the past 24 hour(s)).    Social History   Tobacco Use  Smoking Status Former   Current packs/day: 0.75   Average packs/day: 0.8 packs/day for 20.0 years (15.0 ttl pk-yrs)   Types: Cigarettes  Smokeless Tobacco Former   Quit date: 10/31/1998    Goals Met:  Proper associated with RPD/PD & O2 Sat Independence with exercise equipment Using PLB without cueing & demonstrates good technique Exercise tolerated well No report of concerns or symptoms today Strength training completed today  Goals Unmet:  Not Applicable  Comments: Pt able to follow exercise prescription today without complaint.  Will continue to monitor for progression.

## 2023-01-07 ENCOUNTER — Other Ambulatory Visit: Payer: Self-pay | Admitting: Otorhinolaryngology

## 2023-01-08 ENCOUNTER — Encounter (HOSPITAL_COMMUNITY): Payer: Medicare Other

## 2023-01-10 ENCOUNTER — Encounter (HOSPITAL_COMMUNITY)
Admission: RE | Admit: 2023-01-10 | Discharge: 2023-01-10 | Disposition: A | Discharge status: Home / Self Care | Payer: Medicare Other | Source: Ambulatory Visit | Attending: Internal Medicine | Admitting: Internal Medicine

## 2023-01-10 DIAGNOSIS — J449 Chronic obstructive pulmonary disease, unspecified: Secondary | ICD-10-CM | POA: Diagnosis not present

## 2023-01-10 NOTE — Progress Notes (Signed)
Daily Session Note  Patient Details  Name: CAMI HEATHCOTE MRN: [id number] Date of Birth: 19-Aug-1950 Referring Provider:   Flowsheet Row PULMONARY REHAB OTHER RESP ORIENTATION from 09/24/2022 in [facility]  Referring Provider Warnell Forester MD       Encounter Date: 01/10/2023  Check In:  Session Check In - 01/10/23 1045       Check-In   Supervising physician immediately available to respond to emergencies See telemetry face sheet for immediately available MD    Location [facility]    Staff Present Avanell Shackleton BSN, RN;Jessica [place], [place], RCEP, CCRP, CCET    Virtual Visit No    Medication changes reported     No    Fall or balance concerns reported    No    Tobacco Cessation No Change    Warm-up and Cool-down Performed on first and last piece of equipment    Resistance Training Performed Yes    VAD Patient? No    PAD/SET Patient? No      Pain Assessment   Currently in Pain? No/denies    Pain Score 0-No pain    Multiple Pain Sites No             Capillary Blood Glucose: No results found for this or any previous visit (from the past 24 hour(s)).    Social History   Tobacco Use  Smoking Status Former   Current packs/day: 0.75   Average packs/day: 0.8 packs/day for 20.0 years (15.0 ttl pk-yrs)   Types: Cigarettes  Smokeless Tobacco Former   Quit date: 10/31/1998    Goals Met:  Independence with exercise equipment Exercise tolerated well No report of concerns or symptoms today Strength training completed today  Goals Unmet:  Not Applicable  Comments: .Pt able to follow exercise prescription today without complaint.  Will continue to monitor for progression.

## 2023-01-13 ENCOUNTER — Encounter (HOSPITAL_COMMUNITY): Payer: Medicare Other

## 2023-01-15 ENCOUNTER — Encounter (HOSPITAL_COMMUNITY): Payer: Medicare Other

## 2023-01-17 ENCOUNTER — Encounter (HOSPITAL_COMMUNITY): Payer: Medicare Other

## 2023-01-20 ENCOUNTER — Encounter (HOSPITAL_COMMUNITY): Payer: Medicare Other

## 2023-01-22 ENCOUNTER — Encounter (HOSPITAL_COMMUNITY): Payer: Medicare Other

## 2023-01-24 ENCOUNTER — Encounter (HOSPITAL_COMMUNITY)
Admission: RE | Admit: 2023-01-24 | Discharge: 2023-01-24 | Disposition: A | Discharge status: Home / Self Care | Payer: Medicare Other | Source: Ambulatory Visit | Attending: Internal Medicine | Admitting: Internal Medicine

## 2023-01-24 DIAGNOSIS — J449 Chronic obstructive pulmonary disease, unspecified: Secondary | ICD-10-CM | POA: Diagnosis present

## 2023-01-24 NOTE — Progress Notes (Signed)
Daily Session Note  Patient Details  Name: SIGRID SCHWEBACH MRN: [id number] Date of Birth: Nov 13, 1950 Referring Provider:   Flowsheet Row PULMONARY REHAB OTHER RESP ORIENTATION from 09/24/2022 in [facility]  Referring Provider Warnell Forester MD       Encounter Date: 01/24/2023  Check In:  Session Check In - 01/24/23 1100       Check-In   Supervising physician immediately available to respond to emergencies See telemetry face sheet for immediately available MD    Location [facility]    Staff Present Ross Ludwig, BS, Exercise Physiologist;Jessica [place], MA, RCEP, CCRP, Dow Adolph, RN, BSN    Virtual Visit No    Medication changes reported     No    Fall or balance concerns reported    Yes    Comments Yolande fell last week at a wedding missing her seat. She is sore on the right side of her body    Tobacco Cessation No Change    Warm-up and Cool-down Performed on first and last piece of equipment    Resistance Training Performed Yes    VAD Patient? No    PAD/SET Patient? No      Pain Assessment   Currently in Pain? Yes   pain is soreness due to fall at wedding   Pain Score 6     Pain Location Other (Comment)    Pain Orientation Right    Pain Descriptors / Indicators Sore    Pain Type Acute pain    Pain Onset In the past 7 days    Multiple Pain Sites No             Capillary Blood Glucose: No results found for this or any previous visit (from the past 24 hour(s)).    Social History   Tobacco Use  Smoking Status Former   Current packs/day: 0.75   Average packs/day: 0.8 packs/day for 20.0 years (15.0 ttl pk-yrs)   Types: Cigarettes  Smokeless Tobacco Former   Quit date: 10/31/1998    Goals Met:  Independence with exercise equipment Using PLB without cueing & demonstrates good technique Exercise tolerated well No report of concerns or symptoms today Strength training completed today  Goals Unmet:  Not  Applicable  Comments: Pt able to follow exercise prescription today without complaint.  Will continue to monitor for progression.

## 2023-01-27 ENCOUNTER — Encounter (HOSPITAL_COMMUNITY)
Admission: RE | Admit: 2023-01-27 | Discharge: 2023-01-27 | Disposition: A | Discharge status: Home / Self Care | Payer: Medicare Other | Source: Ambulatory Visit | Attending: Internal Medicine | Admitting: Internal Medicine

## 2023-01-27 VITALS — Ht 60.0 in | Wt 282.2 lb

## 2023-01-27 DIAGNOSIS — J449 Chronic obstructive pulmonary disease, unspecified: Secondary | ICD-10-CM | POA: Diagnosis not present

## 2023-01-27 NOTE — Addendum Note (Signed)
Encounter addended by: Hazle Nordmann on: 01/27/2023 11:27 AM  Actions taken: Clinical Note Signed

## 2023-01-27 NOTE — Progress Notes (Signed)
Ger graduated today from  rehab with 36 sessions completed.  Details of the patient's exercise prescription and what She needs to do in order to continue the prescription and progress were discussed with patient.  Patient was given a copy of prescription and goals.  Patient verbalized understanding. Ted plans to continue to exercise by walking at home.   6 Minute Walk     Row Name 09/24/22 1428 01/27/23 1115       6 Minute Walk   Phase Initial Discharge    Distance 675 feet 480 feet    Walk Time 4.4 minutes 5 minutes    # of Rest Breaks 2  46 sec, 38 sec 1    MPH 1.74 0.91    METS 1.01 0.32    RPE 13 13    Perceived Dyspnea  16 3    VO2 Peak 3.53 0.32    Symptoms Yes (comment) Yes (comment)    Comments hip pain 7/10 L hip pain 6/10 L foot pain 4/10    Resting HR 78 bpm 73 bpm    Resting BP 128/70 130/70    Resting Oxygen Saturation  96 % 96 %    Exercise Oxygen Saturation  during 6 min walk 88 % 86 %    Max Ex. HR 116 bpm 112 bpm    Max Ex. BP 204/112 142/70    2 Minute Post BP 144/72 130/68      Interval HR   1 Minute HR 108 85    2 Minute HR 100  seated 90    3 Minute HR 101 85    4 Minute HR 116 96    5 Minute HR 107 109    6 Minute HR 109 112    2 Minute Post HR 71 82    Interval Heart Rate? Yes Yes      Interval Oxygen   Interval Oxygen? Yes Yes    Baseline Oxygen Saturation % 96 % 96 %    1 Minute Oxygen Saturation % 90 % 87 %    1 Minute Liters of Oxygen 0 L  Room Air 0 L    2 Minute Oxygen Saturation % 90 %  seated 88 %    2 Minute Liters of Oxygen 0 L 0 L    3 Minute Oxygen Saturation % 88 % 86 %    3 Minute Liters of Oxygen 0 L 0 L    4 Minute Oxygen Saturation % 89 % 92 %    4 Minute Liters of Oxygen 0 L 0 L    5 Minute Oxygen Saturation % 88 % 87 %    5 Minute Liters of Oxygen 0 L 0 L    6 Minute Oxygen Saturation % 89 % 86 %    6 Minute Liters of Oxygen 0 L 0 L    2 Minute Post Oxygen Saturation % 95 % 91 %    2 Minute Post Liters of Oxygen 0 L 0  L

## 2023-01-27 NOTE — Patient Instructions (Signed)
Discharge Patient Instructions  Patient Details  Name: Paula [name] MRN: [id number] Date of Birth: 08-22-1950 Referring Provider:  Romeo Rabon, MD   Number of Visits: 36  Reason for Discharge:  Patient reached a stable level of exercise. Patient independent in their exercise. Patient has met program and personal goals.  Smoking History:  Social History   Tobacco Use  Smoking Status Former   Current packs/day: 0.75   Average packs/day: 0.8 packs/day for 20.0 years (15.0 ttl pk-yrs)   Types: Cigarettes  Smokeless Tobacco Former   Quit date: 10/31/1998    Diagnosis:  Chronic obstructive pulmonary disease, unspecified COPD type (HCC)  Initial Exercise Prescription:  Initial Exercise Prescription - 09/24/22 1400       Date of Initial Exercise RX and Referring Provider   Date 09/24/22    Referring Provider Warnell Forester MD      Oxygen   Maintain Oxygen Saturation 88% or higher      Treadmill   MPH 1.5    Grade 0.5    Minutes 17    METs 2.25      NuStep   Level 1    SPM 80    Minutes 17    METs 1.5      REL-XR   Level 1    Watts 20    Speed 50    Minutes 15    METs 1.5      Prescription Details   Frequency (times per week) 3    Duration Progress to 30 minutes of continuous aerobic without signs/symptoms of physical distress      Intensity   THRR 40-80% of Max Heartrate 106-133    Ratings of Perceived Exertion 11-13    Perceived Dyspnea 0-4      Progression   Progression Continue to progress workloads to maintain intensity without signs/symptoms of physical distress.      Resistance Training   Training Prescription Yes    Weight 3 lb    Reps 10-15             Discharge Exercise Prescription (Final Exercise Prescription Changes):  Exercise Prescription Changes - 12/16/22 0917       Response to Exercise   Blood Pressure (Admit) 128/60    Blood Pressure (Exercise) 126/60    Blood Pressure (Exit) 118/62    Heart Rate (Admit) 77  bpm    Heart Rate (Exercise) 88 bpm    Heart Rate (Exit) 70 bpm    Oxygen Saturation (Admit) 94 %    Oxygen Saturation (Exercise) 92 %    Oxygen Saturation (Exit) 95 %    Rating of Perceived Exertion (Exercise) 12    Perceived Dyspnea (Exercise) 2    Duration Continue with 30 min of aerobic exercise without signs/symptoms of physical distress.    Intensity THRR unchanged      Progression   Progression Continue to progress workloads to maintain intensity without signs/symptoms of physical distress.      Resistance Training   Training Prescription Yes    Weight 3 lbs    Reps 10-15      NuStep   Level 4    SPM 120    Minutes 15    METs 2.2      REL-XR   Level 4    Speed 46    Minutes 15    METs 1.6      Home Exercise Plan   Plans to continue exercise at Home (comment)    Frequency  Add 2 additional days to program exercise sessions.      Oxygen   Maintain Oxygen Saturation 88% or higher             Functional Capacity:  6 Minute Walk     Row Name 09/24/22 1428 01/27/23 1115       6 Minute Walk   Phase Initial Discharge    Distance 675 feet 480 feet    Walk Time 4.4 minutes 5 minutes    # of Rest Breaks 2  46 sec, 38 sec 1    MPH 1.74 0.91    METS 1.01 0.32    RPE 13 13    Perceived Dyspnea  16 3    VO2 Peak 3.53 0.32    Symptoms Yes (comment) Yes (comment)    Comments hip pain 7/10 L hip pain 6/10 L foot pain 4/10    Resting HR 78 bpm 73 bpm    Resting BP 128/70 130/70    Resting Oxygen Saturation  96 % 96 %    Exercise Oxygen Saturation  during 6 min walk 88 % 86 %    Max Ex. HR 116 bpm 112 bpm    Max Ex. BP 204/112 142/70    2 Minute Post BP 144/72 130/68      Interval HR   1 Minute HR 108 85    2 Minute HR 100  seated 90    3 Minute HR 101 85    4 Minute HR 116 96    5 Minute HR 107 109    6 Minute HR 109 112    2 Minute Post HR 71 82    Interval Heart Rate? Yes Yes      Interval Oxygen   Interval Oxygen? Yes Yes    Baseline Oxygen  Saturation % 96 % 96 %    1 Minute Oxygen Saturation % 90 % 87 %    1 Minute Liters of Oxygen 0 L  Room Air 0 L    2 Minute Oxygen Saturation % 90 %  seated 88 %    2 Minute Liters of Oxygen 0 L 0 L    3 Minute Oxygen Saturation % 88 % 86 %    3 Minute Liters of Oxygen 0 L 0 L    4 Minute Oxygen Saturation % 89 % 92 %    4 Minute Liters of Oxygen 0 L 0 L    5 Minute Oxygen Saturation % 88 % 87 %    5 Minute Liters of Oxygen 0 L 0 L    6 Minute Oxygen Saturation % 89 % 86 %    6 Minute Liters of Oxygen 0 L 0 L    2 Minute Post Oxygen Saturation % 95 % 91 %    2 Minute Post Liters of Oxygen 0 L 0 L            Quality of Life:  Quality of Life - 01/27/23 1115       Quality of Life   Select Quality of Life      Quality of Life Scores   Health/Function Pre 13.41 %    Health/Function Post 15.5 %    Health/Function % Change 15.59 %    Socioeconomic Pre 21.08 %    Socioeconomic Post 21.5 %    Socioeconomic % Change  1.99 %    Psych/Spiritual Pre 17.43 %    Psych/Spiritual Post 22.29 %    Psych/Spiritual %  Change 27.88 %    Family Pre 24 %    Family Post 27 %    Family % Change 12.5 %    GLOBAL Pre 16.94 %    GLOBAL Post 19.69 %    GLOBAL % Change 16.23 %           Nutrition & Weight - Outcomes:   Post Biometrics - 01/27/23 1117        Post  Biometrics   Height 5' (1.524 m)    Weight 128 kg    Waist Circumference 53 inches    Hip Circumference 59 inches    Waist to Hip Ratio 0.9 %    BMI (Calculated) 55.11    Grip Strength 16.1 kg            Nutrition Discharge:  Nutrition Assessments - 01/27/23 1116       MEDFICTS Scores   Pre Score 50    Post Score 33    Score Difference -17             Education Questionnaire Score:  Knowledge Questionnaire Score - 01/27/23 1112       Knowledge Questionnaire Score   Pre Score 16/18    Post Score 17/18

## 2023-01-27 NOTE — Progress Notes (Addendum)
Daily Session Note  Patient Details  Name: Paula [name] MRN: [id number] Date of Birth: 1950/11/10 Referring Provider:   Flowsheet Row PULMONARY REHAB OTHER RESP ORIENTATION from 09/24/2022 in [facility]  Referring Provider Warnell Forester MD       Encounter Date: 01/27/2023  Check In:  Session Check In - 01/27/23 1110       Check-In   Supervising physician immediately available to respond to emergencies See telemetry face sheet for immediately available ER MD    Location [facility]    Staff Present Ross Ludwig, BS, Exercise Physiologist;Radie Berges Juanetta Gosling, MA, RCEP, CCRP, Dow Adolph, RN, BSN    Virtual Visit No    Medication changes reported     No    Fall or balance concerns reported    No    Warm-up and Cool-down Performed on first and last piece of equipment    Resistance Training Performed Yes    VAD Patient? No    PAD/SET Patient? No      Pain Assessment   Currently in Pain? No/denies             Capillary Blood Glucose: No results found for this or any previous visit (from the past 24 hour(s)).    Social History   Tobacco Use  Smoking Status Former   Current packs/day: 0.75   Average packs/day: 0.8 packs/day for 20.0 years (15.0 ttl pk-yrs)   Types: Cigarettes  Smokeless Tobacco Former   Quit date: 10/31/1998    Goals Met:  Proper associated with RPD/PD & O2 Sat Independence with exercise equipment Using PLB without cueing & demonstrates good technique Exercise tolerated well No report of concerns or symptoms today Strength training completed today  Goals Unmet:  Not Applicable  Comments:  [name] graduated today from  rehab with 36 sessions completed.  Details of the patient's exercise prescription and what She needs to do in order to continue the prescription and progress were discussed with patient.  Patient was given a copy of prescription and goals.  Patient verbalized understanding. Teva plans  to continue to exercise by walking at home.   6 Minute Walk     Row Name 09/24/22 1428 01/27/23 1115       6 Minute Walk   Phase Initial Discharge    Distance 675 feet 480 feet    Walk Time 4.4 minutes 5 minutes    # of Rest Breaks 2  46 sec, 38 sec 1    MPH 1.74 0.91    METS 1.01 0.32    RPE 13 13    Perceived Dyspnea  16 3    VO2 Peak 3.53 0.32    Symptoms Yes (comment) Yes (comment)    Comments hip pain 7/10 L hip pain 6/10 L foot pain 4/10    Resting HR 78 bpm 73 bpm    Resting BP 128/70 130/70    Resting Oxygen Saturation  96 % 96 %    Exercise Oxygen Saturation  during 6 min walk 88 % 86 %    Max Ex. HR 116 bpm 112 bpm    Max Ex. BP 204/112 142/70    2 Minute Post BP 144/72 130/68      Interval HR   1 Minute HR 108 85    2 Minute HR 100  seated 90    3 Minute HR 101 85    4 Minute HR 116 96    5 Minute HR 107 109  6 Minute HR 109 112    2 Minute Post HR 71 82    Interval Heart Rate? Yes Yes      Interval Oxygen   Interval Oxygen? Yes Yes    Baseline Oxygen Saturation % 96 % 96 %    1 Minute Oxygen Saturation % 90 % 87 %    1 Minute Liters of Oxygen 0 L  Room Air 0 L    2 Minute Oxygen Saturation % 90 %  seated 88 %    2 Minute Liters of Oxygen 0 L 0 L    3 Minute Oxygen Saturation % 88 % 86 %    3 Minute Liters of Oxygen 0 L 0 L    4 Minute Oxygen Saturation % 89 % 92 %    4 Minute Liters of Oxygen 0 L 0 L    5 Minute Oxygen Saturation % 88 % 87 %    5 Minute Liters of Oxygen 0 L 0 L    6 Minute Oxygen Saturation % 89 % 86 %    6 Minute Liters of Oxygen 0 L 0 L    2 Minute Post Oxygen Saturation % 95 % 91 %    2 Minute Post Liters of Oxygen 0 L 0 L

## 2023-01-27 NOTE — Progress Notes (Signed)
Pulmonary Individual Treatment Plan  Patient Details  Name: Paula [name] MRN: [id number] Date of Birth: October 20, 1950 Referring Provider:   Flowsheet Row PULMONARY REHAB OTHER RESP ORIENTATION from 09/24/2022 in [facility] C[facility] RE[facility]  Referring Provider Warnell Forester MD       Initial Encounter Date:  Flowsheet Row PULMONARY REHAB OTHER RESP ORIENTATION from 09/24/2022 in [place] CARDIAC REHABILITATION  Date 09/24/22       Visit Diagnosis: Chronic obstructive pulmonary disease, unspecified COPD type (HCC)  Patient's Home Medications on Admission:   Current Outpatient Medications:    acetaminophen (TYLENOL) 500 MG tablet, Take 500-1,000 mg by mouth every 6 (six) hours as needed (pain.)., Disp: , Rfl:    albuterol (VENTOLIN HFA) 108 (90 Base) MCG/ACT inhaler, Inhale 2 puffs into the lungs every 6 (six) hours as needed for wheezing or shortness of breath., Disp: , Rfl:    azelastine (ASTELIN) 0.1 % nasal spray, Place 2 sprays into both nostrils 2 (two) times daily as needed for allergies. Use in each nostril as directed, Disp: , Rfl:    budesonide-formoterol (SYMBICORT) 160-4.5 MCG/ACT inhaler, Inhale 2 puffs into the lungs 2 (two) times daily., Disp: , Rfl:    Cholecalciferol (VITAMIN D) 125 MCG (5000 UT) CAPS, Take 5,000 Units by mouth in the morning., Disp: , Rfl:    clotrimazole-betamethasone (LOTRISONE) cream, Apply 1 application topically 2 (two) times daily as needed (skin irritation.). , Disp: , Rfl:    diclofenac (VOLTAREN) 75 MG EC tablet, Take 75 mg by mouth daily as needed (arthritis pain)., Disp: , Rfl:    ezetimibe (ZETIA) 10 MG tablet, Take 10 mg by mouth in the morning., Disp: , Rfl:    famotidine (PEPCID) 40 MG tablet, Take 40 mg by mouth at bedtime., Disp: , Rfl:    fluticasone (FLONASE) 50 MCG/ACT nasal spray, Place 2 sprays into both nostrils daily as needed for allergies., Disp: , Rfl:    guaiFENesin (MUCINEX) 600 MG 12 hr tablet, Take 600 mg by mouth  daily as needed (congestion/COPD issues)., Disp: , Rfl:    hydrochlorothiazide (HYDRODIURIL) 25 MG tablet, Take 25 mg by mouth in the morning., Disp: , Rfl:    Ketotifen Fumarate (ALLERGY EYE DROPS OP), Place 1 drop into both eyes 2 (two) times daily as needed (allergy eyes)., Disp: , Rfl:    loratadine (CLARITIN) 10 MG tablet, Take 10 mg by mouth daily as needed for allergies., Disp: , Rfl:    losartan (COZAAR) 100 MG tablet, Take 100 mg by mouth in the morning., Disp: , Rfl:    montelukast (SINGULAIR) 10 MG tablet, Take 10 mg by mouth at bedtime., Disp: , Rfl:    Multiple Vitamins-Minerals (PRESERVISION AREDS 2 PO), Take 1 tablet by mouth in the morning and at bedtime., Disp: , Rfl:    NON FORMULARY, CPAP, O2 at night, Disp: , Rfl:    omeprazole (PRILOSEC) 40 MG capsule, Take 40 mg by mouth daily before breakfast., Disp: , Rfl:    sertraline (ZOLOFT) 100 MG tablet, Take 100 mg by mouth at bedtime., Disp: , Rfl:    terbinafine (LAMISIL) 250 MG tablet, Take 250 mg by mouth in the morning., Disp: , Rfl:   Past Medical History: Past Medical History:  Diagnosis Date   Anxiety    Arthritis    COPD (chronic obstructive pulmonary disease) (HCC)    Heart murmur    Hypertension    Lung nodule    LLL   Sleep apnea  CPAP with O2 2 liters    Tubal ectopic pregnancy 1970   Vitelliform macular dystrophy     Tobacco Use: Social History   Tobacco Use  Smoking Status Former   Current packs/day: 0.75   Average packs/day: 0.8 packs/day for 20.0 years (15.0 ttl pk-yrs)   Types: Cigarettes  Smokeless Tobacco Former   Quit date: 10/31/1998    Labs: Review Flowsheet        No data to display          Capillary Blood Glucose: No results found for: "GLUCAP"   Pulmonary Assessment Scores:  Pulmonary Assessment Scores     Row Name 09/24/22 1454 01/27/23 1116       ADL UCSD   ADL Phase Entry Exit    SOB Score total 64 34    Rest 1 0    Walk 3 1    Stairs 4 3    Bath 3 1     Dress 1 0    Shop 4 2      CAT Score   CAT Score 18 16      mMRC Score   mMRC Score 3 3            UCSD: Self-administered rating of dyspnea associated with activities of daily living (ADLs) 6-point scale (0 = "not at all" to 5 = "maximal or unable to do because of breathlessness")  Scoring Scores range from 0 to 120.  Minimally important difference is 5 units  CAT: CAT can identify the health impairment of COPD patients and is better correlated with disease progression.  CAT has a scoring range of zero to 40. The CAT score is classified into four groups of low (less than 10), medium (10 - 20), high (21-30) and very high (31-40) based on the impact level of disease on health status. A CAT score over 10 suggests significant symptoms.  A worsening CAT score could be explained by an exacerbation, poor medication adherence, poor inhaler technique, or progression of COPD or comorbid conditions.  CAT MCID is 2 points  mMRC: mMRC (Modified Medical Research Council) Dyspnea Scale is used to assess the degree of baseline functional disability in patients of respiratory disease due to dyspnea. No minimal important difference is established. A decrease in score of 1 point or greater is considered a positive change.   Pulmonary Function Assessment:  Pulmonary Function Assessment - 09/24/22 1454       Breath   Shortness of Breath Yes;Fear of Shortness of Breath;Limiting activity             Exercise Target Goals: Exercise Program Goal: Individual exercise prescription set using results from initial 6 min walk test and THRR while considering  patient's activity barriers and safety.   Exercise Prescription Goal: Initial exercise prescription builds to 30-45 minutes a day of aerobic activity, 2-3 days per week.  Home exercise guidelines will be given to patient during program as part of exercise prescription that the participant will acknowledge.  Activity Barriers & Risk  Stratification:  Activity Barriers & Cardiac Risk Stratification - 09/24/22 1250       Activity Barriers & Cardiac Risk Stratification   Activity Barriers Arthritis;Deconditioning;Muscular Weakness;Shortness of Breath;Joint Problems;Balance Concerns   currently in PT for foot            6 Minute Walk:  6 Minute Walk     Row Name 09/24/22 1428 01/27/23 1115       6 Minute Walk  Phase Initial Discharge    Distance 675 feet 480 feet    Walk Time 4.4 minutes 5 minutes    # of Rest Breaks 2  46 sec, 38 sec 1    MPH 1.74 0.91    METS 1.01 0.32    RPE 13 13    Perceived Dyspnea  16 3    VO2 Peak 3.53 0.32    Symptoms Yes (comment) Yes (comment)    Comments hip pain 7/10 L hip pain 6/10 L foot pain 4/10    Resting HR 78 bpm 73 bpm    Resting BP 128/70 130/70    Resting Oxygen Saturation  96 % 96 %    Exercise Oxygen Saturation  during 6 min walk 88 % 86 %    Max Ex. HR 116 bpm 112 bpm    Max Ex. BP 204/112 142/70    2 Minute Post BP 144/72 130/68      Interval HR   1 Minute HR 108 85    2 Minute HR 100  seated 90    3 Minute HR 101 85    4 Minute HR 116 96    5 Minute HR 107 109    6 Minute HR 109 112    2 Minute Post HR 71 82    Interval Heart Rate? Yes Yes      Interval Oxygen   Interval Oxygen? Yes Yes    Baseline Oxygen Saturation % 96 % 96 %    1 Minute Oxygen Saturation % 90 % 87 %    1 Minute Liters of Oxygen 0 L  Room Air 0 L    2 Minute Oxygen Saturation % 90 %  seated 88 %    2 Minute Liters of Oxygen 0 L 0 L    3 Minute Oxygen Saturation % 88 % 86 %    3 Minute Liters of Oxygen 0 L 0 L    4 Minute Oxygen Saturation % 89 % 92 %    4 Minute Liters of Oxygen 0 L 0 L    5 Minute Oxygen Saturation % 88 % 87 %    5 Minute Liters of Oxygen 0 L 0 L    6 Minute Oxygen Saturation % 89 % 86 %    6 Minute Liters of Oxygen 0 L 0 L    2 Minute Post Oxygen Saturation % 95 % 91 %    2 Minute Post Liters of Oxygen 0 L 0 L             Oxygen Initial  Assessment:  Oxygen Initial Assessment - 09/24/22 1249       Home Oxygen   Home Oxygen Device Home Concentrator    Sleep Oxygen Prescription Continuous;CPAP    Liters per minute 2    Home Exercise Oxygen Prescription None    Home Resting Oxygen Prescription None    Compliance with Home Oxygen Use Yes      Initial 6 min Walk   Oxygen Used None      Program Oxygen Prescription   Program Oxygen Prescription None      Intervention   Short Term Goals To learn and exhibit compliance with exercise, home and travel O2 prescription;To learn and understand importance of monitoring SPO2 with pulse oximeter and demonstrate accurate use of the pulse oximeter.;To learn and understand importance of maintaining oxygen saturations>88%;To learn and demonstrate proper pursed lip breathing techniques or other breathing techniques. ;To learn and  demonstrate proper use of respiratory medications    Long  Term Goals Exhibits compliance with exercise, home  and travel O2 prescription;Verbalizes importance of monitoring SPO2 with pulse oximeter and return demonstration;Maintenance of O2 saturations>88%;Exhibits proper breathing techniques, such as pursed lip breathing or other method taught during program session;Compliance with respiratory medication;Demonstrates proper use of MDI's             Oxygen Re-Evaluation:  Oxygen Re-Evaluation     Row Name 09/24/22 1446 10/02/22 1117 10/07/22 1341 10/14/22 1121 10/23/22 1210     Program Oxygen Prescription   Program Oxygen Prescription None None None None --     Home Oxygen   Home Oxygen Device Home Concentrator None None None --   Sleep Oxygen Prescription Continuous;CPAP Continuous;CPAP Continuous;CPAP Continuous;CPAP --   Liters per minute 2 -- 2 2 --   Home Exercise Oxygen Prescription None None None None --   Home Resting Oxygen Prescription None None None None --   Compliance with Home Oxygen Use Yes Yes Yes Yes --     Goals/Expected Outcomes    Short Term Goals To learn and demonstrate proper pursed lip breathing techniques or other breathing techniques. ;To learn and exhibit compliance with exercise, home and travel O2 prescription;To learn and understand importance of monitoring SPO2 with pulse oximeter and demonstrate accurate use of the pulse oximeter.;To learn and understand importance of maintaining oxygen saturations>88%;To learn and demonstrate proper use of respiratory medications To learn and demonstrate proper pursed lip breathing techniques or other breathing techniques. ;To learn and exhibit compliance with exercise, home and travel O2 prescription;To learn and understand importance of monitoring SPO2 with pulse oximeter and demonstrate accurate use of the pulse oximeter.;To learn and understand importance of maintaining oxygen saturations>88%;To learn and demonstrate proper use of respiratory medications To learn and demonstrate proper pursed lip breathing techniques or other breathing techniques. ;To learn and exhibit compliance with exercise, home and travel O2 prescription;To learn and understand importance of monitoring SPO2 with pulse oximeter and demonstrate accurate use of the pulse oximeter.;To learn and understand importance of maintaining oxygen saturations>88%;To learn and demonstrate proper use of respiratory medications To learn and demonstrate proper pursed lip breathing techniques or other breathing techniques. ;To learn and exhibit compliance with exercise, home and travel O2 prescription;To learn and understand importance of monitoring SPO2 with pulse oximeter and demonstrate accurate use of the pulse oximeter.;To learn and understand importance of maintaining oxygen saturations>88%;To learn and demonstrate proper use of respiratory medications To learn and demonstrate proper pursed lip breathing techniques or other breathing techniques.    Long  Term Goals Exhibits proper breathing techniques, such as pursed lip breathing  or other method taught during program session;Exhibits compliance with exercise, home  and travel O2 prescription;Verbalizes importance of monitoring SPO2 with pulse oximeter and return demonstration;Maintenance of O2 saturations>88%;Compliance with respiratory medication;Demonstrates proper use of MDI's Exhibits proper breathing techniques, such as pursed lip breathing or other method taught during program session;Exhibits compliance with exercise, home  and travel O2 prescription;Verbalizes importance of monitoring SPO2 with pulse oximeter and return demonstration;Maintenance of O2 saturations>88%;Compliance with respiratory medication;Demonstrates proper use of MDI's Exhibits proper breathing techniques, such as pursed lip breathing or other method taught during program session;Exhibits compliance with exercise, home  and travel O2 prescription;Verbalizes importance of monitoring SPO2 with pulse oximeter and return demonstration;Maintenance of O2 saturations>88%;Compliance with respiratory medication;Demonstrates proper use of MDI's Exhibits proper breathing techniques, such as pursed lip breathing or other method taught during program session;Exhibits compliance  with exercise, home  and travel O2 prescription;Verbalizes importance of monitoring SPO2 with pulse oximeter and return demonstration;Maintenance of O2 saturations>88%;Compliance with respiratory medication;Demonstrates proper use of MDI's Exhibits proper breathing techniques, such as pursed lip breathing or other method taught during program session   Comments Reviewed PLB technique with pt.  Talked about how it works and it's importance in maintaining their exercise saturations. Talked to patient about triggers today.  She is also good about using her PLB to help with breath control.  She is compliant with her inhalers.  She will use her abuterol inhaler on bad breathing days.  She does not have a spacer, so we talked about trying to get her one to  use at home. Talked to patient about triggers today.  She is also good about using her PLB to help with breath control.  She is compliant with her inhalers.  She will use her abuterol inhaler on bad breathing days.  She does not have a spacer, so we talked about trying to get her one to use at home. Paula [name] is doing well in rehab. She is compliant with her CPAP and it helps her sleep through the night with it.  She feels good with it.  She continues to work on using her PLB to help with SOB.  She has been using her inhaler more during hot season as the heat irritates her breathing more. She now has a spacer and we reviewed how to use it. Diaphragmatic and PLB breathing explained and performed with patient. Patient has a better understanding of how to do these exercises to help with breathing performance and relaxation. Patient performed breathing techniques adequately and to practice further at home.   Goals/Expected Outcomes Short: Become more profiecient at using PLB.   Long: Become independent at using PLB. Short: Get spacer to use with inhalers  Long; Continuet use PLB and stay compliant with inhaler Short: Get spacer to use with inhalers  Long; Continuet use PLB and stay compliant with inhaler Short: Continue to work on using PLB routinely Long: Conitnued compliance Short: practice PLB and diaphragmatic breathing at home. Long: Use PLB and diaphragmatic breathing independently    Row Name 11/18/22 1132 12/11/22 1145           Program Oxygen Prescription   Program Oxygen Prescription None None        Home Oxygen   Home Oxygen Device None None      Sleep Oxygen Prescription Continuous;CPAP Continuous;CPAP      Liters per minute 2 --      Home Exercise Oxygen Prescription None None      Home Resting Oxygen Prescription None None      Compliance with Home Oxygen Use Yes Yes        Goals/Expected Outcomes   Short Term Goals To learn and demonstrate proper pursed lip breathing techniques or other  breathing techniques. ;To learn and exhibit compliance with exercise, home and travel O2 prescription;To learn and understand importance of monitoring SPO2 with pulse oximeter and demonstrate accurate use of the pulse oximeter.;To learn and understand importance of maintaining oxygen saturations>88% To learn and demonstrate proper pursed lip breathing techniques or other breathing techniques. ;To learn and exhibit compliance with exercise, home and travel O2 prescription;To learn and understand importance of monitoring SPO2 with pulse oximeter and demonstrate accurate use of the pulse oximeter.;To learn and understand importance of maintaining oxygen saturations>88%      Long  Term Goals Exhibits compliance with exercise,  home  and travel O2 prescription;Verbalizes importance of monitoring SPO2 with pulse oximeter and return demonstration;Maintenance of O2 saturations>88%;Exhibits proper breathing techniques, such as pursed lip breathing or other method taught during program session;Compliance with respiratory medication Exhibits compliance with exercise, home  and travel O2 prescription;Verbalizes importance of monitoring SPO2 with pulse oximeter and return demonstration;Maintenance of O2 saturations>88%;Exhibits proper breathing techniques, such as pursed lip breathing or other method taught during program session;Compliance with respiratory medication      Comments Paula [name] is doing well in rehab.  She continues to stay compliant with CPAP and oxygen at night. She uses her PLB during exericse and when SOB.  Her saturations are doing well overall.  She still gets SOB, but feels like she can manage it better now. Paula [name] continues to do well in rehab. She is staying compliant with her CPAP at night. SHe uses PLB when she feels SOB and it helps bring her O2 back up. She does feel like her SOB has improved since starting rehab.      Goals/Expected Outcomes ShortL Continue to work on PLB Long: Continue to stay compliant  ShortL Continue to work on PLB Long: Continue to stay compliant               Oxygen Discharge (Final Oxygen Re-Evaluation):  Oxygen Re-Evaluation - 12/11/22 1145       Program Oxygen Prescription   Program Oxygen Prescription None      Home Oxygen   Home Oxygen Device None    Sleep Oxygen Prescription Continuous;CPAP    Home Exercise Oxygen Prescription None    Home Resting Oxygen Prescription None    Compliance with Home Oxygen Use Yes      Goals/Expected Outcomes   Short Term Goals To learn and demonstrate proper pursed lip breathing techniques or other breathing techniques. ;To learn and exhibit compliance with exercise, home and travel O2 prescription;To learn and understand importance of monitoring SPO2 with pulse oximeter and demonstrate accurate use of the pulse oximeter.;To learn and understand importance of maintaining oxygen saturations>88%    Long  Term Goals Exhibits compliance with exercise, home  and travel O2 prescription;Verbalizes importance of monitoring SPO2 with pulse oximeter and return demonstration;Maintenance of O2 saturations>88%;Exhibits proper breathing techniques, such as pursed lip breathing or other method taught during program session;Compliance with respiratory medication    Comments Paula [name] continues to do well in rehab. She is staying compliant with her CPAP at night. SHe uses PLB when she feels SOB and it helps bring her O2 back up. She does feel like her SOB has improved since starting rehab.    Goals/Expected Outcomes ShortL Continue to work on PLB Long: Continue to stay compliant             Initial Exercise Prescription:  Initial Exercise Prescription - 09/24/22 1400       Date of Initial Exercise RX and Referring Provider   Date 09/24/22    Referring Provider Warnell Forester MD      Oxygen   Maintain Oxygen Saturation 88% or higher      Treadmill   MPH 1.5    Grade 0.5    Minutes 17    METs 2.25      NuStep   Level 1     SPM 80    Minutes 17    METs 1.5      REL-XR   Level 1    Watts 20    Speed 50    Minutes 15  METs 1.5      Prescription Details   Frequency (times per week) 3    Duration Progress to 30 minutes of continuous aerobic without signs/symptoms of physical distress      Intensity   THRR 40-80% of Max Heartrate 106-133    Ratings of Perceived Exertion 11-13    Perceived Dyspnea 0-4      Progression   Progression Continue to progress workloads to maintain intensity without signs/symptoms of physical distress.      Resistance Training   Training Prescription Yes    Weight 3 lb    Reps 10-15             Perform Capillary Blood Glucose checks as needed.  Exercise Prescription Changes:   Exercise Prescription Changes     Row Name 09/24/22 1400 10/07/22 1300 10/14/22 1100 10/21/22 1300 11/06/22 1200     Response to Exercise   Blood Pressure (Admit) 128/70 140/62 -- 116/72 130/80   Blood Pressure (Exercise) 204/112 158/82 -- 112/70 --   Blood Pressure (Exit) 134/74 162/84 -- 112/64 124/76   Heart Rate (Admit) 78 bpm 87 bpm -- 71 bpm 63 bpm   Heart Rate (Exercise) 116 bpm 87 bpm -- 88 bpm 84 bpm   Heart Rate (Exit) 87 bpm 71 bpm -- 71 bpm 68 bpm   Oxygen Saturation (Admit) 96 % 92 % -- 97 % 94 %   Oxygen Saturation (Exercise) 88 % 93 % -- 93 % 91 %   Oxygen Saturation (Exit) 91 % 96 % -- 96 % 95 %   Rating of Perceived Exertion (Exercise) 13 13 -- 12 13   Perceived Dyspnea (Exercise) 16 1 -- 1 3   Symptoms hip pain 7/10 -- -- -- --   Comments walk test orientation -- -- -- --   Duration -- Continue with 30 min of aerobic exercise without signs/symptoms of physical distress. -- Continue with 30 min of aerobic exercise without signs/symptoms of physical distress. Continue with 30 min of aerobic exercise without signs/symptoms of physical distress.   Intensity -- THRR unchanged -- THRR unchanged THRR unchanged     Progression   Progression -- Continue to progress  workloads to maintain intensity without signs/symptoms of physical distress. -- Continue to progress workloads to maintain intensity without signs/symptoms of physical distress. Continue to progress workloads to maintain intensity without signs/symptoms of physical distress.     Resistance Training   Training Prescription -- Yes -- Yes Yes   Weight -- 3 -- 3 3   Reps -- 10-15 -- 10-15 10-15     NuStep   Level -- 1 -- 2 3   SPM -- 110 -- 116 99   Minutes -- 15 -- 15 15   METs -- 2 -- 2 2     REL-XR   Level -- 1 -- 2 3   Speed -- 48 -- 50 53   Minutes -- 15 -- 15 15   METs -- 1.5 -- 1.8 2.3     Home Exercise Plan   Plans to continue exercise at -- -- Home (comment)  walking, weights, videos -- --   Frequency -- -- Add 1 additional day to program exercise sessions. -- --   Initial Home Exercises Provided -- -- 10/14/22 -- --     Oxygen   Maintain Oxygen Saturation -- 88% or higher -- 88% or higher 88% or higher    Row Name 11/18/22 1100 12/02/22 1300 12/16/22 0917  Response to Exercise   Blood Pressure (Admit) -- 129/60 128/60     Blood Pressure (Exercise) -- -- 126/60     Blood Pressure (Exit) -- 128/68 118/62     Heart Rate (Admit) -- 70 bpm 77 bpm     Heart Rate (Exercise) -- 89 bpm 88 bpm     Heart Rate (Exit) -- 71 bpm 70 bpm     Oxygen Saturation (Admit) -- 95 % 94 %     Oxygen Saturation (Exercise) -- 93 % 92 %     Oxygen Saturation (Exit) -- 95 % 95 %     Rating of Perceived Exertion (Exercise) -- 12 12     Perceived Dyspnea (Exercise) -- 2 2     Duration -- Continue with 30 min of aerobic exercise without signs/symptoms of physical distress. Continue with 30 min of aerobic exercise without signs/symptoms of physical distress.     Intensity -- THRR unchanged THRR unchanged       Progression   Progression -- Continue to progress workloads to maintain intensity without signs/symptoms of physical distress. Continue to progress workloads to maintain intensity  without signs/symptoms of physical distress.       Resistance Training   Training Prescription -- Yes Yes     Weight -- 3 3 lbs     Reps -- 10-15 10-15       NuStep   Level -- 4 4     SPM -- 106 120     Minutes -- 15 15     METs -- 2.1 2.2       REL-XR   Level -- 3 4     Speed -- 52 46     Minutes -- 15 15     METs -- 2.1 1.6       Home Exercise Plan   Plans to continue exercise at Home (comment)  walking, weights, videos Home (comment) Home (comment)     Frequency Add 1 additional day to program exercise sessions. Add 2 additional days to program exercise sessions. Add 2 additional days to program exercise sessions.     Initial Home Exercises Provided 10/14/22 -- --       Oxygen   Maintain Oxygen Saturation -- 88% or higher 88% or higher              Exercise Comments:   Exercise Comments     Row Name 01/27/23 1112           Exercise Comments Paula [name] graduated today from  rehab with 36 sessions completed.  Details of the patient's exercise prescription and what She needs to do in order to continue the prescription and progress were discussed with patient.  Patient was given a copy of prescription and goals.  Patient verbalized understanding. Paula [name] plans to continue to exercise by walking at home.                Exercise Goals and Review:   Exercise Goals     Row Name 09/24/22 1445 10/07/22 1334           Exercise Goals   Increase Physical Activity Yes Yes      Intervention Provide advice, education, support and counseling about physical activity/exercise needs.;Develop an individualized exercise prescription for aerobic and resistive training based on initial evaluation findings, risk stratification, comorbidities and participant's personal goals. Provide advice, education, support and counseling about physical activity/exercise needs.;Develop an individualized exercise prescription for aerobic and resistive training based on  initial evaluation findings,  risk stratification, comorbidities and participant's personal goals.      Expected Outcomes Short Term: Attend rehab on a regular basis to increase amount of physical activity.;Long Term: Add in home exercise to make exercise part of routine and to increase amount of physical activity.;Long Term: Exercising regularly at least 3-5 days a week. Short Term: Attend rehab on a regular basis to increase amount of physical activity.;Long Term: Add in home exercise to make exercise part of routine and to increase amount of physical activity.;Long Term: Exercising regularly at least 3-5 days a week.      Increase Strength and Stamina Yes Yes      Intervention Provide advice, education, support and counseling about physical activity/exercise needs.;Develop an individualized exercise prescription for aerobic and resistive training based on initial evaluation findings, risk stratification, comorbidities and participant's personal goals. Provide advice, education, support and counseling about physical activity/exercise needs.;Develop an individualized exercise prescription for aerobic and resistive training based on initial evaluation findings, risk stratification, comorbidities and participant's personal goals.      Expected Outcomes Short Term: Increase workloads from initial exercise prescription for resistance, speed, and METs.;Short Term: Perform resistance training exercises routinely during rehab and add in resistance training at home;Long Term: Improve cardiorespiratory fitness, muscular endurance and strength as measured by increased METs and functional capacity ( ) Short Term: Increase workloads from initial exercise prescription for resistance, speed, and METs.;Short Term: Perform resistance training exercises routinely during rehab and add in resistance training at home;Long Term: Improve cardiorespiratory fitness, muscular endurance and strength as measured by increased METs and functional capacity ( )       Able to understand and use rate of perceived exertion (RPE) scale Yes Yes      Intervention Provide education and explanation on how to use RPE scale Provide education and explanation on how to use RPE scale      Expected Outcomes Short Term: Able to use RPE daily in rehab to express subjective intensity level;Long Term:  Able to use RPE to guide intensity level when exercising independently Short Term: Able to use RPE daily in rehab to express subjective intensity level;Long Term:  Able to use RPE to guide intensity level when exercising independently      Able to understand and use Dyspnea scale Yes Yes      Intervention Provide education and explanation on how to use Dyspnea scale Provide education and explanation on how to use Dyspnea scale      Expected Outcomes Short Term: Able to use Dyspnea scale daily in rehab to express subjective sense of shortness of breath during exertion;Long Term: Able to use Dyspnea scale to guide intensity level when exercising independently Short Term: Able to use Dyspnea scale daily in rehab to express subjective sense of shortness of breath during exertion;Long Term: Able to use Dyspnea scale to guide intensity level when exercising independently      Knowledge and understanding of Target Heart Rate Range (THRR) Yes Yes      Intervention Provide education and explanation of THRR including how the numbers were predicted and where they are located for reference Provide education and explanation of THRR including how the numbers were predicted and where they are located for reference      Expected Outcomes Short Term: Able to state/look up THRR;Short Term: Able to use daily as guideline for intensity in rehab;Long Term: Able to use THRR to govern intensity when exercising independently Short Term: Able to state/look up THRR;Short Term: Able  to use daily as guideline for intensity in rehab;Long Term: Able to use THRR to govern intensity when exercising independently       Able to check pulse independently Yes Yes      Intervention Provide education and demonstration on how to check pulse in carotid and radial arteries.;Review the importance of being able to check your own pulse for safety during independent exercise Provide education and demonstration on how to check pulse in carotid and radial arteries.;Review the importance of being able to check your own pulse for safety during independent exercise      Expected Outcomes Short Term: Able to explain why pulse checking is important during independent exercise;Long Term: Able to check pulse independently and accurately Short Term: Able to explain why pulse checking is important during independent exercise;Long Term: Able to check pulse independently and accurately      Understanding of Exercise Prescription Yes Yes      Intervention Provide education, explanation, and written materials on patient's individual exercise prescription Provide education, explanation, and written materials on patient's individual exercise prescription      Expected Outcomes Short Term: Able to explain program exercise prescription;Long Term: Able to explain home exercise prescription to exercise independently Short Term: Able to explain program exercise prescription;Long Term: Able to explain home exercise prescription to exercise independently               Exercise Goals Re-Evaluation :  Exercise Goals Re-Evaluation     Row Name 09/24/22 1445 10/07/22 1334 10/14/22 1134 10/21/22 1346 11/07/22 0913     Exercise Goal Re-Evaluation   Exercise Goals Review Able to understand and use rate of perceived exertion (RPE) scale;Able to understand and use Dyspnea scale Increase Physical Activity;Increase Strength and Stamina;Able to understand and use rate of perceived exertion (RPE) scale;Able to understand and use Dyspnea scale;Knowledge and understanding of Target Heart Rate Range (THRR);Able to check pulse independently;Understanding of  Exercise Prescription Increase Physical Activity;Increase Strength and Stamina;Able to understand and use rate of perceived exertion (RPE) scale;Able to understand and use Dyspnea scale;Knowledge and understanding of Target Heart Rate Range (THRR);Able to check pulse independently;Understanding of Exercise Prescription -- Increase Physical Activity;Understanding of Exercise Prescription;Increase Strength and Stamina   Comments Reviewed RPE  and dyspnea scale and program prescription with pt today.  Pt voiced understanding and was given a copy of goals to take home. Pt has comepleted 4 sessions of pulmonary rehab. She is motivated to be back in the program after having foot surgery. She is progressing slowly. She is currently exercising a t2.0 METs on the stepper. Will continue to monitor and progress as able. Reviewed home exercise with pt today.  Pt plans to walk with dog and use weights at home for exercise.  We also talked about trying out staff videos.  She is also going to PT once a week.  Reviewed THR, pulse, RPE, sign and symptoms, pulse oximetery and when to call 911 or MD.  Also discussed weather considerations and indoor options.  Pt voiced understanding.  She is feeling better since starting rehab. She wants to continue to work on weight loss Patient is enjoying rehab. She has increased her SPM and METs on the stepper. Paula [name] has increased her levels on both the NuStep and XR to level 3. She is also working at an RPE of 13 for both. Will continue to monitor and progress asable .   Expected Outcomes Short: Use RPE daily to regulate intensity.  Long: Follow program prescription  Through exercise at rehab and home, patient will achieve their goals. Short: Start to add in home exercise Long: Continue to build strength and stamina -- Short term goal: continue at workloads until RPE is an 47 then increase levels    Long term goal: continue to attend pulmonary rehab    Row Name 11/18/22 1125 12/11/22 1114  12/17/22 0918         Exercise Goal Re-Evaluation   Exercise Goals Review Increase Physical Activity;Increase Strength and Stamina;Understanding of Exercise Prescription Increase Physical Activity;Increase Strength and Stamina;Understanding of Exercise Prescription Increase Physical Activity;Increase Strength and Stamina;Understanding of Exercise Prescription     Comments Paula [name] is doing well in rehab. She missed a couple last week.  She is doing her home exercise and walking more. She has noticed that her strength and stamina are improving.  She does note that she is working hard in class as she is sore the next day. Paula [name] has been doing great with exercise. She has been dealing with heel pain every now and then causing her not to be able to walk some days. She continues to notice an increase in her stamina and endurance. Paula [name] has been tolerating exercise well. She has been exercising at a level 4 on the NuStep over the past two weeks and has a high SPM of 120. Will talk about increasing level for next class. Will continue to monitor and progress as able     Expected Outcomes Short: Continue to add in more exercise at home Long: Continue to improve stamina Short term: continue to walk at hoome and add exercise at home   long term: continue to improve stamina Short term: increase level on NuStep   long term: continue to attend rehab              Discharge Exercise Prescription (Final Exercise Prescription Changes):  Exercise Prescription Changes - 12/16/22 0917       Response to Exercise   Blood Pressure (Admit) 128/60    Blood Pressure (Exercise) 126/60    Blood Pressure (Exit) 118/62    Heart Rate (Admit) 77 bpm    Heart Rate (Exercise) 88 bpm    Heart Rate (Exit) 70 bpm    Oxygen Saturation (Admit) 94 %    Oxygen Saturation (Exercise) 92 %    Oxygen Saturation (Exit) 95 %    Rating of Perceived Exertion (Exercise) 12    Perceived Dyspnea (Exercise) 2    Duration Continue with 30 min  of aerobic exercise without signs/symptoms of physical distress.    Intensity THRR unchanged      Progression   Progression Continue to progress workloads to maintain intensity without signs/symptoms of physical distress.      Resistance Training   Training Prescription Yes    Weight 3 lbs    Reps 10-15      NuStep   Level 4    SPM 120    Minutes 15    METs 2.2      REL-XR   Level 4    Speed 46    Minutes 15    METs 1.6      Home Exercise Plan   Plans to continue exercise at Home (comment)    Frequency Add 2 additional days to program exercise sessions.      Oxygen   Maintain Oxygen Saturation 88% or higher             Nutrition:  Target Goals: Understanding of nutrition guidelines, daily  intake of sodium 1500mg , cholesterol 200mg , calories 30% from fat and 7% or less from saturated fats, daily to have 5 or more servings of fruits and vegetables.  Biometrics:   Post Biometrics - 01/27/23 1117        Post  Biometrics   Height 5' (1.524 m)    Weight 128 kg    Waist Circumference 53 inches    Hip Circumference 59 inches    Waist to Hip Ratio 0.9 %    BMI (Calculated) 55.11    Grip Strength 16.1 kg             Nutrition Therapy Plan and Nutrition Goals:  Nutrition Therapy & Goals - 09/30/22 1233       Nutrition Therapy   RD appointment deferred Yes      Personal Nutrition Goals   Comments We offer  educational sessions on heart healthy nutrition with handouts.      Intervention Plan   Intervention Nutrition handout(s) given to patient.    Expected Outcomes Short Term Goal: Understand basic principles of dietary content, such as calories, fat, sodium, cholesterol and nutrients.             Nutrition Assessments:  Nutrition Assessments - 01/27/23 1116       MEDFICTS Scores   Pre Score 50    Post Score 33    Score Difference -17            MEDIFICTS Score Key: >=70 Need to make dietary changes  40-70 Heart Healthy Diet <= 40  Therapeutic Level Cholesterol Diet   Picture Your Plate Scores: <54 Unhealthy dietary pattern with much room for improvement. 41-50 Dietary pattern unlikely to meet recommendations for good health and room for improvement. 51-60 More healthful dietary pattern, with some room for improvement.  >60 Healthy dietary pattern, although there may be some specific behaviors that could be improved.    Nutrition Goals Re-Evaluation:  Nutrition Goals Re-Evaluation     Row Name 10/14/22 1117 11/18/22 1130 12/11/22 1122         Goals   Nutrition Goal Focus on listening to hunger cues Short: Focus on hunger cues and use water Long: Continue to improve diet Healthy eating     Comment Paula [name] has noticed that she will eat and then feel hungry shortly afterwards.  We talked about listening to her hunger cues, as she used to ignore them. She is now using water to help.  She is also working on focusing on healthy eating with more fruit and vegetables.  She is also working on portion control. Paula [name] is doing well in rehab.  She continues to work on her diet.  Her husband is not cooking healthy, but she is, so they are working on getting back to cooking healthy again. She is sitll working on listening to hunger cues and portion control. Paula [name] continues to work on her nutrion. Her husband does not cook healthy and loves to bake sweets. She does have cheat days during the weekend due to family events and partys.     Expected Outcome Short: Focus on hunger cues and use water Long: Continue to improve diet Short: COntinue to work on hunger cues Long: COnitnue to work on portion control Short: continue to choose healthy items    long term: continue to work on portion control              Nutrition Goals Discharge (Final Nutrition Goals Re-Evaluation):  Nutrition Goals Re-Evaluation - 12/11/22  1122       Goals   Nutrition Goal Healthy eating    Comment Paula [name] continues to work on her nutrion. Her husband does not  cook healthy and loves to bake sweets. She does have cheat days during the weekend due to family events and partys.    Expected Outcome Short: continue to choose healthy items    long term: continue to work on portion control             Psychosocial: Target Goals: Acknowledge presence or absence of significant depression and/or stress, maximize coping skills, provide positive support system. Participant is able to verbalize types and ability to use techniques and skills needed for reducing stress and depression.  Initial Review & Psychosocial Screening:  Initial Psych Review & Screening - 09/24/22 1251       Initial Review   Current issues with Current Psychotropic Meds;Current Depression;Current Sleep Concerns;History of Depression      Family Dynamics   Good Support System? Yes   husband (doesn't help with diet), kids     Barriers   Psychosocial barriers to participate in program The patient should benefit from training in stress management and relaxation.;Psychosocial barriers identified (see note)      Screening Interventions   Interventions Encouraged to exercise;Provide feedback about the scores to participant;To provide support and resources with identified psychosocial needs    Expected Outcomes Short Term goal: Identification and review with participant of any Quality of Life or Depression concerns found by scoring the questionnaire.;Long Term Goal: Stressors or current issues are controlled or eliminated.;Long Term goal: The participant improves quality of Life and PHQ9 Scores as seen by post scores and/or verbalization of changes;Short Term goal: Utilizing psychosocial counselor, staff and physician to assist with identification of specific Stressors or current issues interfering with healing process. Setting desired goal for each stressor or current issue identified.             Quality of Life Scores:  Quality of Life - 01/27/23 1115       Quality of Life    Select Quality of Life      Quality of Life Scores   Health/Function Pre 13.41 %    Health/Function Post 15.5 %    Health/Function % Change 15.59 %    Socioeconomic Pre 21.08 %    Socioeconomic Post 21.5 %    Socioeconomic % Change  1.99 %    Psych/Spiritual Pre 17.43 %    Psych/Spiritual Post 22.29 %    Psych/Spiritual % Change 27.88 %    Family Pre 24 %    Family Post 27 %    Family % Change 12.5 %    GLOBAL Pre 16.94 %    GLOBAL Post 19.69 %    GLOBAL % Change 16.23 %            Scores of 19 and below usually indicate a poorer quality of life in these areas.  A difference of  2-3 points is a clinically meaningful difference.  A difference of 2-3 points in the total score of the Quality of Life Index has been associated with significant improvement in overall quality of life, self-image, physical symptoms, and general health in studies assessing change in quality of life.   PHQ-9: Review Flowsheet  More data exists      01/27/2023 12/16/2022 11/11/2022 10/14/2022 09/24/2022  Depression screen PHQ 2/9  Decreased Interest 1 1 1 1 1   Down, Depressed, Hopeless 1 1 1  0  1  PHQ - 2 Score 2 2 2 1 2   Altered sleeping 1 0 2 0 0  Tired, decreased energy 1 3 3 2 2   Change in appetite 2 0 0 1 2  Feeling bad or failure about yourself  0 0 1 1 0  Trouble concentrating 0 0 0 0 0  Moving slowly or fidgety/restless 0 1 0 0 1  Suicidal thoughts 0 0 0 0 0  PHQ-9 Score 6 6 8 5 7   Difficult doing work/chores Somewhat difficult Somewhat difficult Somewhat difficult Somewhat difficult Very difficult    Details           Interpretation of Total Score  Total Score Depression Severity:  1-4 = Minimal depression, 5-9 = Mild depression, 10-14 = Moderate depression, 15-19 = Moderately severe depression, 20-27 = Severe depression   Psychosocial Evaluation and Intervention:  Psychosocial Evaluation - 09/24/22 1430       Psychosocial Evaluation & Interventions   Interventions Stress  management education;Encouraged to exercise with the program and follow exercise prescription    Comments Paula [name] is coming into pulmonary rehab for COPD.  She was not able to finish program last time due to a foot surgery but knew it made a big difference for her.  She is eager to return and get moving again.  She does not have any barriers to getting to rehab other than multiple doctor's appointments which we discussed making up those up at end.  She lives with her husband who is the primary cook, which causes her stress.  Her husband and cousins are her support system.  She is a retired Archivist.  Paula [name] has a history of depression and is currently on meds but feels well managed at this time.  Her PHQ indicated mil depression (6) but she feels it comes from not being able to do everything she wants to currently.  She enjoys working in her garden, but not able to do as much curerntly and would like to be to get back in the garden.  She really wants to be able to breathe better and increase her stamina in rehab.  She would really like to work on weight loss as part of being able to move around better.  Her biggest goal is to be able to dance at her niece's wedding in October!  She wants to enjoy and have fun.    Expected Outcomes Short: Attend rehab to start to build stamina and improve breathing Long: Continue to find coping skills to help manage depression and continue to build stamina    Continue Psychosocial Services  Follow up required by staff             Psychosocial Re-Evaluation:  Psychosocial Re-Evaluation     Row Name 09/30/22 1234 10/14/22 1114 11/11/22 1132 11/20/22 1110 12/11/22 1118     Psychosocial Re-Evaluation   Current issues with Current Depression;Current Sleep Concerns;Current Psychotropic Meds Current Depression;Current Sleep Concerns;Current Psychotropic Meds Current Depression;Current Sleep Concerns;Current Psychotropic Meds Current Depression;Current Sleep  Concerns;Current Psychotropic Meds Current Depression;Current Sleep Concerns;Current Psychotropic Meds   Comments Patient is new to the program. She has completed 2 sessions. She continues to have no psychosocial barriers identified. Her depressions continues to be managed with Sertraline. She has participated in the program before and enjoys the sessions and demonstrates an interest in improving her health. Her personal goals for the program breathe better; increase her stamina; lose weight; and be able to dance at her neice's wedding on  October. We will continue to monitor her progress. Krimson is doing well in rehab.  She is feeling better overall.  Her PHQ has improved from 7 down to 5.  We will continue to monitor her PHQ.  She still struggles with sleep and will feel bad about herself some times, but that is improving.  She feels that her mood is stable and her Zoloft is working for her. We talked about the exercise helping as well. Reviewed patient health questionnaire (PHQ-9) with patient for follow up. Previously, patients score indicated signs/symptoms of depression.  Reviewed to see if patient is improving symptom wise while in program.  Score declined and patient states that it is because they have not been sleeping well again.  She does not nap and goes through periods when she has times of good sleep and some of bad, so she is currently in her rougher pattern.  This leaves her feeling tired and worn up.  She also gets frustrated that she is not further along and not able to go and do everything she normally would have in the past. Pt is taking Sertraline as prescribed to help with depression.  Pt states that her sleeping has improved recently;however, she is still frustrated that her energy level is not what it was before she had surgery. Paula [name] stated that her sleep has improved. She stated that she is still frustrated with her energy levels and gets fatigued quickly when walking aorund.   Expected  Outcomes Pt's depression and sleep will continue to be managed, and she will have no other identifiable psychosocial issues. Short: Continue to exercise for mental boost Long: continue to be compliant with meds. short: Continue to work on sleep Long: Continue to stay postive and exercise for mental boost. Short term:  Continue to get enough sleep so that your energy is improved to walk more       Long term:  Continue to attend PR to increase her strength/stamina Short term: include walking in home exercise a little at a time to build endurance   long term: continue to exercise for improved energy and happiness   Interventions Stress management education;Relaxation education Encouraged to attend Pulmonary Rehabilitation for the exercise;Stress management education Encouraged to attend Pulmonary Rehabilitation for the exercise;Stress management education Stress management education;Relaxation education;Encouraged to attend Pulmonary Rehabilitation for the exercise Stress management education;Relaxation education;Encouraged to attend Pulmonary Rehabilitation for the exercise   Continue Psychosocial Services  No Follow up required Follow up required by staff Follow up required by staff Follow up required by staff Follow up required by staff    Row Name 12/16/22 1112             Psychosocial Re-Evaluation   Comments Reviewed patient health questionnaire (PHQ-9) with patient for follow up. Previously, patients score indicated signs/symptoms of depression.  Reviewed to see if patient is improving symptom wise while in program.  Score improved and patient states that it is because they have been moving more and feeling better.       Expected Outcomes Short: Continue to attend [facility] regularly for regular exercise and social engagement. Long: Continue to improve symptoms and manage a positive mental state.       Interventions Stress management education;Encouraged to attend Pulmonary Rehabilitation  for the exercise       Continue Psychosocial Services  Follow up required by staff                Psychosocial Discharge (Final Psychosocial Re-Evaluation):  Psychosocial Re-Evaluation - 12/16/22 1112       Psychosocial Re-Evaluation   Comments Reviewed patient health questionnaire (PHQ-9) with patient for follow up. Previously, patients score indicated signs/symptoms of depression.  Reviewed to see if patient is improving symptom wise while in program.  Score improved and patient states that it is because they have been moving more and feeling better.    Expected Outcomes Short: Continue to attend [facility] regularly for regular exercise and social engagement. Long: Continue to improve symptoms and manage a positive mental state.    Interventions Stress management education;Encouraged to attend Pulmonary Rehabilitation for the exercise    Continue Psychosocial Services  Follow up required by staff              Education: Education Goals: Education classes will be provided on a weekly basis, covering required topics. Participant will state understanding/return demonstration of topics presented.  Learning Barriers/Preferences:  Learning Barriers/Preferences - 09/24/22 1248       Learning Barriers/Preferences   Learning Barriers Sight   glasses   Learning Preferences Written Material;Skilled Demonstration             Education Topics: How Lungs Work and Diseases: - Discuss the anatomy of the lungs and diseases that can affect the lungs, such as COPD. Flowsheet Row PULMONARY REHAB OTHER RESPIRATORY from 04/25/2022 in [place] [facility]  Date 12/27/21  Educator DF  Instruction Review Code 1- Verbalizes Understanding       Exercise: -Discuss the importance of exercise, FITT principles of exercise, normal and abnormal responses to exercise, and how to exercise safely.   Environmental Irritants: -Discuss types of environmental irritants and  how to limit exposure to environmental irritants. Flowsheet Row PULMONARY REHAB OTHER RESPIRATORY from 01/10/2023 in [place] [facility]  Date 10/02/22  Educator [facility]  Instruction Review Code 1- Verbalizes Understanding       Meds/Inhalers and oxygen: - Discuss respiratory medications, definition of an inhaler and oxygen, and the proper way to use an inhaler and oxygen. Flowsheet Row PULMONARY REHAB OTHER RESPIRATORY from 01/10/2023 in [place] [facility]  Date 10/09/22  Educator [facility]       Energy Saving Techniques: - Discuss methods to conserve energy and decrease shortness of breath when performing activities of daily living.  Flowsheet Row PULMONARY REHAB OTHER RESPIRATORY from 01/10/2023 in [place] [facility]  Date 10/16/22  Educator Handout  Instruction Review Code 1- Verbalizes Understanding       Bronchial Hygiene / Breathing Techniques: - Discuss breathing mechanics, pursed-lip breathing technique,  proper posture, effective ways to clear airways, and other functional breathing techniques Flowsheet Row PULMONARY REHAB OTHER RESPIRATORY from 01/10/2023 in [place] [facility]  Date 10/23/22  Educator [facility]  Instruction Review Code 1- Personnel officer: - Provides group verbal and written instruction about the health risks of elevated stress, cause of high stress, and healthy ways to reduce stress.   Nutrition I: Fats: - Discuss the types of cholesterol, what cholesterol does to the body, and how cholesterol levels can be controlled. Flowsheet Row PULMONARY REHAB OTHER RESPIRATORY from 01/10/2023 in [place] [facility]  Date 11/06/22  Educator [facility]  Instruction Review Code 1- Verbalizes Understanding       Nutrition II: Labels: -Discuss the different components of food labels and how to read food labels. Flowsheet Row PULMONARY REHAB OTHER RESPIRATORY from  01/10/2023 in [place] [facility]  Date 11/06/22  Educator  [facility]  Instruction Review Code 1- Verbalizes Understanding       Respiratory Infections: - Discuss the signs and symptoms of respiratory infections, ways to prevent respiratory infections, and the importance of seeking medical treatment when having a respiratory infection.   Stress I: Signs and Symptoms: - Discuss the causes of stress, how stress may lead to anxiety and depression, and ways to limit stress. Flowsheet Row PULMONARY REHAB OTHER RESPIRATORY from 04/25/2022 in [place] [facility]  Date 03/07/22  Educator DF  Instruction Review Code 2- Demonstrated Understanding       Stress II: Relaxation: -Discuss relaxation techniques to limit stress. Flowsheet Row PULMONARY REHAB OTHER RESPIRATORY from 04/25/2022 in [place] [facility]  Date 12/13/21  Educator DM  Instruction Review Code 1- Verbalizes Understanding       Oxygen for Home/Travel: - Discuss how to prepare for travel when on oxygen and proper ways to transport and store oxygen to ensure safety. Flowsheet Row PULMONARY REHAB OTHER RESPIRATORY from 04/25/2022 in [place] [facility]  Date 12/20/21  Educator DF  Instruction Review Code 1- Verbalizes Understanding       Knowledge Questionnaire Score:  Knowledge Questionnaire Score - 01/27/23 1112       Knowledge Questionnaire Score   Pre Score 16/18    Post Score 17/18             Core Components/Risk Factors/Patient Goals at Admission:  Personal Goals and Risk Factors at Admission - 09/24/22 1255       Core Components/Risk Factors/Patient Goals on Admission    Weight Management Yes;Obesity;Weight Loss    Intervention Weight Management: Provide education and appropriate resources to help participant work on and attain dietary goals.;Weight Management/Obesity: Establish reasonable short term and long term weight goals.;Obesity: Provide education  and appropriate resources to help participant work on and attain dietary goals.    Admit Weight 282 lb 6.4 oz (128.1 kg)    Goal Weight: Short Term 277 lb (125.6 kg)    Goal Weight: Long Term 272 lb (123.4 kg)    Expected Outcomes Short Term: Continue to assess and modify interventions until short term weight is achieved;Long Term: Adherence to nutrition and physical activity/exercise program aimed toward attainment of established weight goal;Weight Loss: Understanding of general recommendations for a balanced deficit meal plan, which promotes 1-2 lb weight loss per week and includes a negative energy balance of [phone] kcal/d;Understanding recommendations for meals to include 15-35% energy as protein, 25-35% energy from fat, 35-60% energy from carbohydrates, less than 200mg  of dietary cholesterol, 20-35 gm of total fiber daily;Understanding of distribution of calorie intake throughout the day with the consumption of 4-5 meals/snacks    Improve shortness of breath with ADL's Yes    Intervention Provide education, individualized exercise plan and daily activity instruction to help decrease symptoms of SOB with activities of daily living.    Expected Outcomes Short Term: Improve cardiorespiratory fitness to achieve a reduction of symptoms when performing ADLs;Long Term: Be able to perform more ADLs without symptoms or delay the onset of symptoms    Increase knowledge of respiratory medications and ability to use respiratory devices properly  Yes    Intervention Provide education and demonstration as needed of appropriate use of medications, inhalers, and oxygen therapy.    Expected Outcomes Short Term: Achieves understanding of medications use. Understands that oxygen is a medication prescribed by physician. Demonstrates appropriate use of inhaler and oxygen therapy.;Long Term: Maintain appropriate use of medications, inhalers, and oxygen  therapy.    Hypertension Yes    Intervention Provide education on  lifestyle modifcations including regular physical activity/exercise, weight management, moderate sodium restriction and increased consumption of fresh fruit, vegetables, and low fat dairy, alcohol moderation, and smoking cessation.;Monitor prescription use compliance.    Expected Outcomes Short Term: Continued assessment and intervention until BP is < 140/96mm HG in hypertensive participants. < 130/36mm HG in hypertensive participants with diabetes, heart failure or chronic kidney disease.;Long Term: Maintenance of blood pressure at goal levels.             Core Components/Risk Factors/Patient Goals Review:   Goals and Risk Factor Review     Row Name 09/30/22 1237 10/14/22 1120 11/18/22 1126 12/11/22 1125       Core Components/Risk Factors/Patient Goals Review   Personal Goals Review Weight Management/Obesity;Increase knowledge of respiratory medications and ability to use respiratory devices properly.;Improve shortness of breath with ADL's;Other;Hypertension Weight Management/Obesity;Increase knowledge of respiratory medications and ability to use respiratory devices properly.;Improve shortness of breath with ADL's;Other;Hypertension Weight Management/Obesity;Increase knowledge of respiratory medications and ability to use respiratory devices properly.;Improve shortness of breath with ADL's;Hypertension Weight Management/Obesity;Increase knowledge of respiratory medications and ability to use respiratory devices properly.;Improve shortness of breath with ADL's;Hypertension    Review Patient was referred to PR with COPD. She has completed 1 session. Her current weight is 282.4 lbs up 0.4 lbs since her initial visit. She exercises on RA with O2 saturations at 93%. Her blood pressure is hypertensive today. We will monitor her progress in the program. Annabel is doing well in rehab. Her weight is holding steady.  She is pleased that it is not going up.  We have not started to add in home exercise yet,  but we talked about how that will help as well.  She is doing better with her breathing for most part, however, heat still gets to her.   She is doing well with meds and will use her inhaler more on bad breathing days. Chyler is doing well in rehab. She had a heart follow up last week.  They want her to start to wear compression socks to help with circulation.  She also had a foot follow up and they said she is continuing to make progress, but want to continue to see her in follow up.  Her weight is still yoyoing but steady overall.  Her breathing is still bothering her with the heat.  Her pressrues are doing well. Shelonda continues to do well in rehab. She is still dealing with heel pain from her past surgery and scare tissue. She is continuing to watch her weight by trying to watch what she eats. She does have breating still bothering her. She was shopping with her graddaughter during a humid day and did get very SOB and had to take muliple breaks and then family go home due to being very tired.    Expected Outcomes Patient will complete the program meeting both personal and program goals. Short: Conitnue to work on breathing Long: Conitnue to work on [employer] loss Short: Conitnue to work on weight loss and try compression socks Long: continue to montior risk factors. Short: Conitnue to work on weight loss and try compression socks Long: continue to montior risk factors.             Core Components/Risk Factors/Patient Goals at Discharge (Final Review):   Goals and Risk Factor Review - 12/11/22 1125       Core Components/Risk Factors/Patient Goals Review  Personal Goals Review Weight Management/Obesity;Increase knowledge of respiratory medications and ability to use respiratory devices properly.;Improve shortness of breath with ADL's;Hypertension    Review Hermela continues to do well in rehab. She is still dealing with heel pain from her past surgery and scare tissue. She is continuing to watch her  weight by trying to watch what she eats. She does have breating still bothering her. She was shopping with her graddaughter during a humid day and did get very SOB and had to take muliple breaks and then family go home due to being very tired.    Expected Outcomes Short: Conitnue to work on weight loss and try compression socks Long: continue to montior risk factors.             ITP Comments:  ITP Comments     Row Name 09/24/22 1419 10/09/22 1249 11/06/22 0833 12/04/22 0829 01/01/23 0848   ITP Comments Pt attended orientation today for Pulmonary Rehab. Completed and gym orientation. Documentation for diagnosis can be found in CE office visit 08/05/22.  Initial ITP created and sent for review to Dr. York Grice, Medical Director. Pt service time 1225-1330. 30 day review completed. ITP sent to Dr.Jehanzeb Memon, Medical Director of  Pulmonary Rehab. Continue with ITP unless changes are made by physician. 30 day review completed. ITP sent to Dr.Jehanzeb Memon, Medical Director of  Pulmonary Rehab. Continue with ITP unless changes are made by physician. 30 day review completed. ITP sent to Dr.Jehanzeb Memon, Medical Director of  Pulmonary Rehab. Continue with ITP unless changes are made by physician. 30 day review completed. ITP sent to Dr.Jehanzeb Memon, Medical Director of  Pulmonary Rehab. Continue with ITP unless changes are made by physician.    Row Name 01/27/23 1112           ITP Comments Bianca graduated today from  rehab with 36 sessions completed.  Details of the patient's exercise prescription and what She needs to do in order to continue the prescription and progress were discussed with patient.  Patient was given a copy of prescription and goals.  Patient verbalized understanding. Sophiana plans to continue to exercise by walking at home.                Comments: Discharge ITP

## 2023-01-29 ENCOUNTER — Encounter (HOSPITAL_COMMUNITY): Payer: Medicare Other

## 2023-01-29 NOTE — Progress Notes (Signed)
 Name:Audrey Ray Gains Age: 72 y.o.   Date of Service: 01/30/2023  Referring Physician: Gerald Dexter, MD   Date of Injury: No data found 12/02/2022  PT Date Care Plan Established/Reviewed:01/30/2023  PT Date Treatment Started:01/30/2023    End of Certificat

## 2023-01-30 ENCOUNTER — Encounter: Payer: Self-pay | Admitting: Rehabilitative and Restorative Service Providers"

## 2023-01-30 ENCOUNTER — Inpatient Hospital Stay
Payer: Medicare Other | Attending: Student in an Organized Health Care Education/Training Program | Admitting: Rehabilitative and Restorative Service Providers"

## 2023-01-30 ENCOUNTER — Telehealth: Payer: Self-pay

## 2023-01-30 VITALS — BP 136/75 | HR 66

## 2023-01-30 DIAGNOSIS — M25572 Pain in left ankle and joints of left foot: Secondary | ICD-10-CM | POA: Insufficient documentation

## 2023-01-30 NOTE — Telephone Encounter (Signed)
Spoke to pt re: cancellation request. Pt stated it was a mistake and would like to keep GC appt.

## 2023-01-31 ENCOUNTER — Inpatient Hospital Stay: Payer: Medicare Other | Admitting: Rehabilitative and Restorative Service Providers"

## 2023-01-31 ENCOUNTER — Encounter (HOSPITAL_COMMUNITY): Payer: Medicare Other

## 2023-01-31 DIAGNOSIS — M25572 Pain in left ankle and joints of left foot: Secondary | ICD-10-CM

## 2023-01-31 NOTE — PT/OT Therapy Note (Signed)
 Name: Adelle Zachar Age: 72 y.o.   Date of Service: 01/31/2023  Referring Physician: Gerald Dexter, MD   Date of Injury: No data found 12/02/2022  PT Date Care Plan Established/Reviewed:01/30/2023  PT Date Treatment Started:01/30/2023    End of Certifica

## 2023-02-04 ENCOUNTER — Inpatient Hospital Stay: Payer: Medicare Other | Admitting: Rehabilitative and Restorative Service Providers"

## 2023-02-04 DIAGNOSIS — M25572 Pain in left ankle and joints of left foot: Secondary | ICD-10-CM

## 2023-02-04 NOTE — PT/OT Therapy Note (Signed)
 Name: Audrey Ray Age: 72 y.o.   Date of Service: 02/04/2023  Referring Physician: Gerald Dexter, MD   Date of Injury: No data found 12/02/2022  PT Date Care Plan Established/Reviewed:01/30/2023  PT Date Treatment Started:01/30/2023    End of Certific

## 2023-02-06 ENCOUNTER — Inpatient Hospital Stay: Payer: Medicare Other | Admitting: Rehabilitative and Restorative Service Providers"

## 2023-02-06 DIAGNOSIS — M25572 Pain in left ankle and joints of left foot: Secondary | ICD-10-CM

## 2023-02-06 NOTE — PT/OT Therapy Note (Signed)
 Name: Audrey Ray Age: 72 y.o.   Date of Service: 02/06/2023  Referring Physician: Gerald Dexter, MD   Date of Injury: No data found 12/02/2022  PT Date Care Plan Established/Reviewed:01/30/2023  PT Date Treatment Started:01/30/2023    End of Certific

## 2023-02-11 ENCOUNTER — Inpatient Hospital Stay: Payer: Medicare Other | Admitting: Rehabilitative and Restorative Service Providers"

## 2023-02-11 ENCOUNTER — Ambulatory Visit: Payer: Self-pay | Attending: Internal Medicine

## 2023-02-11 DIAGNOSIS — M25572 Pain in left ankle and joints of left foot: Secondary | ICD-10-CM

## 2023-02-11 DIAGNOSIS — Z800 Family history of malignant neoplasm of digestive organs: Secondary | ICD-10-CM

## 2023-02-11 DIAGNOSIS — Z8041 Family history of malignant neoplasm of ovary: Secondary | ICD-10-CM

## 2023-02-11 NOTE — Progress Notes (Signed)
 SUMMARY  Audrey Ray is a 72 y.o. female referred to the [facility] for a group genetic counseling session regarding her family history of cancer. After discussion of the risks, benefits, and limitations of geneti

## 2023-02-11 NOTE — PT/OT Therapy Note (Signed)
 Name: Arriah Wadle Age: 72 y.o.   Date of Service: 02/11/2023  Referring Physician: Gerald Dexter, MD   Date of Injury: No data found 12/02/2022  PT Date Care Plan Established/Reviewed:01/30/2023  PT Date Treatment Started:01/30/2023    End of Certific

## 2023-02-12 NOTE — PT/OT Therapy Note (Signed)
 Name: Audrey Ray Age: 72 y.o.   Date of Service: 02/13/2023  Referring Physician: Gerald Dexter, MD   Date of Injury: No data found 12/02/2022  PT Date Care Plan Established/Reviewed:01/30/2023  PT Date Treatment Started:01/30/2023    End of Certific

## 2023-02-13 ENCOUNTER — Inpatient Hospital Stay: Payer: Medicare Other | Admitting: Rehabilitative and Restorative Service Providers"

## 2023-02-13 DIAGNOSIS — M25572 Pain in left ankle and joints of left foot: Secondary | ICD-10-CM

## 2023-02-17 ENCOUNTER — Inpatient Hospital Stay: Payer: Medicare Other | Admitting: Rehabilitative and Restorative Service Providers"

## 2023-02-17 DIAGNOSIS — M25572 Pain in left ankle and joints of left foot: Secondary | ICD-10-CM

## 2023-02-17 NOTE — PT/OT Therapy Note (Signed)
Name: Audrey Ray Age: 72 y.o.   Date of Service: 02/17/2023  Referring Physician: Gerald Dexter, MD   Date of Injury: No data found 12/02/2022  PT Date Care Plan Established/Reviewed:01/30/2023  PT Date Treatment Started:01/30/2023    End of Certification Date: 04/29/2023  Sessions in Plan of Care: 16  Surgery Date: No data was found  MD Follow-up: No data was found  Medbridge Code: No data was found    Visit Count: 7   Diagnosis:    Diagnosis ICD-10-CM Associated Order   1. Acute left ankle pain  M25.572                  Subjective     Daily Subjective   "The top of the ankle got irritated and it hurt the rest of the evening but it didn't hurt when I got up this morning."    Social Support/Occupation    Lives in: multiple level home    Lives with: spouse    Occupation: retired           Precautions: hearing  osteopenia/osteoporosis  Allergies: Patient has no known allergies.    Objective              Initial Evaluation Reference and/or Current Measurements(as dated):    Girth/Edema: Other: see below    Initial R Initial L   Bimalleolar 24.0 24.8   Figure 8       Midfoot       Forefoot       (blank fields were intentionally left blank)     Range of Motion: (degrees)  Initial  Right  AROM Initial  Right PROM    Right AROM    Right PROM Ankle Initial  Left AROM Initial  Left PROM    Left AROM    Left PROM   8       Dorsiflexion 0 p!         60       Plantarflexion 48         34       Inversion 32         22       Eversion 18         126       Knee Flexion 126         0       Knee Extension 0         (blank fields were intentionally left blank)     Strength:   Initial  R    R LE Strength  PSI Initial  L    L    18.5   Ankle Dorsiflexion 15.6     19.5   Ankle Plantarflexion 17.5     9.5   Ankle Inversion 10.2     9.3   Ankle Eversion 8.7         Quadriceps           Hamstrings       22.1   Hip Abduction 12.4     (blank fields were intentionally left blank)     Flexibility:      Comment:   Hamstrings NT      Quadriceps NT     Piriformis NT     ITBand NT     Iliopsoas NT     Gastroc Restricted Left           Balance:  SLS R: Eyes Open (EO): 20  sec.Eyes Closed (EC): NT sec.  SLS L: Eyes Open (EO): 6     sec.Eyes Closed (EC): NT sec.     Special Tests/Neurological Screen:    R L   R L   Anterior Drawer (-) (+) Windlass (-) (-)   Posterior Drawer (-) (-) Navicular Drop NT NT   Talar Tilt (-) (-) Thompsons NT NT   Valgus Stress (-) (-) Varus Stress (-) (-)                Treatment     Therapeutic Exercises - Justified to address any of the following:  To develop strength, endurance, ROM and/or flexibility.   Subjective intake for session planning    - A/P rocks in mini squat on trampoline 2x20 with cues for motion generation from ankles and preventing valgus collapse  - Lateral rocks in mini squat on trampoline 2x20 with cues to prevent valgus arch collapse  - Introduced wall sit ankle DF alternating 2x20 each LE  - Gastroc Stretch on Wall  3 reps - 30 sec hold  - Step up on 8" step with blue therapad on top 2x10 each LE with cues to push evenly through tripod of foot  - Reviewed DF self-mobilizations on step x10 L    Manual Therapy - Justified to address any of the following:    Mobilization of joints and soft tissues, manipulation, manual lymphatic drainage, and/or manual traction.    STM to L plantar fascia, tibialis posterior, tibialis anterior, peroneals, gastroc, soleus  Grade III A/P mobilizations talocrural joint L  Grade III A/P and P/A midfoot mobilizations L    Dry Needling(DN):  Indications & benefits/contraindications/how differs from acupuncture/potential complications (risk factors & side effects) were discussed and explained to Audrey Ray.  Audrey Ray showed good understanding and signed the consent form prior to the first treatment, which is scanned and located in Media file for this and future visits. In long sitting position: deep and soft tissue mobilization and palpation performed on ankle trigger  points/restricted tissue. Trigger point (TrP) DN performed to left tibialis anterior and EHL  muscles at the site(s) of trigger point and/or maximal tenderness to decrease dysfunction. Intramuscular stim applied to above muscles for 6 minutes with gradual increase in intensity and sensitivity to patient's tolerance. Post DN, soft tissue limitation manually assessed/released through soft tissue mobilization. Educated pt that soreness could be expected for approximately 24 hours and given after care instructions.  Patient verbalized understanding.  Minutes performing the dry needling technique are not accounted for in treatment time.    Home Exercises   Access Code: [email or web address]  URL: [email or web address]  Date: 02/13/2023  Prepared by: Ricka Burdock    Exercises  - Long Sitting Ankle Eversion with Resistance  - 1 x daily - 7 x weekly - 2 sets - 10 reps  - Long Sitting Ankle Inversion with Resistance  - 1 x daily - 7 x weekly - 2 sets - 10 reps  - Long Sitting Ankle Dorsiflexion with Anchored Resistance  - 1 x daily - 7 x weekly - 2 sets - 10 reps  - Toe Yoga - Alternating Great Toe and Lesser Toe Extension  - 1 x daily - 7 x weekly - 2 sets - 20 reps  - Gastroc Stretch on Wall  - 1 x daily - 7 x weekly - 1 sets - 3 reps - 30 sec hold  - Standing Eccentric Heel Raise  - 1 x daily - 7 x weekly -  2 sets - 10 reps  - Single Leg Balance with Clock Reach  - 1 x daily - 7 x weekly - 2 sets - 10 reps  - Side Stepping with Resistance at Feet  - 1 x daily - 7 x weekly - 2 sets - 10 reps  - Kneeling Ankle Dorsiflexion Self-Mobilization with Towel  - 1 x daily - 7 x weekly - 2 sets - 10 reps       ---      Flowsheet Row ---   Total Time    Timed Minutes 41 minutes   Total Time 41 minutes                    Assessment   Patient presents with slight increase in anterior left ankle pain with weight bearing / ambulation over the weekend. Reproduced familiar pain with palpation at L tibialis anterior today, discussed  potential precipitating factor of walking in loose fitting clogs over the weekend increasing muscular demand on anterior ankle muscles. DN was utilized with appropriate twitch contraction and/or referred pain response to address reported dysfunction.  This resulted in decreased TTP and anterior ankle pain with walking and the expectation is to see functional change with DN intervention in the next couple of visits. Patient reported expected soreness with DN and no unusual response to intervention during treatment. Will need to monitor carry over and effectiveness of DN with function after this session to assist with determining continued use. Continued strengthening of global LE but introduced specific WB ankle DF activation to assist with muscular strength and endurance for ambulation. She will continue to benefit from skilled PT to reduce pain and restore PLOF for community ambulation.   Plan   Continue per POC. Progress load tolerance through LLE. Progress proximal control.       Goals      Goal 1: Patient will demonstrate independence in prescribed HEP with proper form, sets and reps for safe discharge to an independent program.     Sessions: 16      Goal 2: STG: Pt will verbalize understanding of underlying pain mechanism and exacerbating factors to prevent re-injury and facilitate carryover throughout POC     Sessions: 6      Goal 3: Patient will improve ankle dorsiflexion AROM with knee extended to greater than 10 degrees to allow patient to demonstrate improved gait mechanics for initial heel strike, and during stance phase and avoidance of compensatory movements to reduce injurious fall risk without pain.     Sessions: 16      Goal 4:  Improve FOTO Score to 71 points to reach predicted FOTO outcome score and safe performance of return to walking program without difficulty.   Sessions: 16          Goal 5: Patient will demonstrate ability to perform 10 single leg heel raises with knees extended to allow  patient to demonstrate push off during gait to complete walking for 60 minutes to perform community ambulation without pain.     Sessions: 16                                   Audrey Ray, DPT      Parts of this note were generated by the Epic EMR system/Dragon speech recognition and may contain inherent errors or omissions not intended by the user. Grammatical errors, random word insertions, deletions, pronoun  errors and incomplete sentences are occasional consequences of this technology due to software limitations. Not all errors are caught or corrected.  If there are questions or concerns about the content of this note or information contained within the body of this dictation they should be addressed directly with the author for clarification.

## 2023-02-19 ENCOUNTER — Inpatient Hospital Stay: Payer: Medicare Other | Admitting: Rehabilitative and Restorative Service Providers"

## 2023-02-19 DIAGNOSIS — M25572 Pain in left ankle and joints of left foot: Secondary | ICD-10-CM

## 2023-02-19 NOTE — PT/OT Therapy Note (Signed)
Name: Audrey Ray Age: 72 y.o.   Date of Service: 02/19/2023  Referring Physician: Gerald Dexter, MD   Date of Injury: No data found 12/02/2022  PT Date Care Plan Established/Reviewed:01/30/2023  PT Date Treatment Started:01/30/2023    End of Certification Date: 04/29/2023  Sessions in Plan of Care: 16  Surgery Date: No data was found  MD Follow-up: No data was found  Medbridge Code: No data was found    Visit Count: 8   Diagnosis:    Diagnosis ICD-10-CM Associated Order   1. Acute left ankle pain  M25.572                  Subjective     Daily Subjective   "I haven't felt the pain at the top of the ankle since the needling last time. The only thing I felt was just one time I was going down the stairs I felt the ankle was unstable for one flight of the stairs but not the other. I didn't get much soreness after the needling."    Social Support/Occupation    Lives in: multiple level home    Lives with: spouse    Occupation: retired           Precautions: hearing  osteopenia/osteoporosis  Allergies: Patient has no known allergies.    Objective              Initial Evaluation Reference and/or Current Measurements(as dated):    Girth/Edema: Other: see below    Initial R Initial L   Bimalleolar 24.0 24.8   Figure 8       Midfoot       Forefoot       (blank fields were intentionally left blank)     Range of Motion: (degrees)  Initial  Right  AROM Initial  Right PROM    Right AROM    Right PROM Ankle Initial  Left AROM Initial  Left PROM  11/27  Left AROM    Left PROM   8       Dorsiflexion 0 p!    6     60       Plantarflexion 48   50      34       Inversion 32   38      22       Eversion 18   22      126       Knee Flexion 126         0       Knee Extension 0         (blank fields were intentionally left blank)     Strength:   Initial  R    R LE Strength  PSI Initial  L    L    18.5   Ankle Dorsiflexion 15.6     19.5   Ankle Plantarflexion 17.5     9.5   Ankle Inversion 10.2     9.3   Ankle Eversion 8.7          Quadriceps           Hamstrings       22.1   Hip Abduction 12.4     (blank fields were intentionally left blank)     Flexibility:      Comment:   Hamstrings NT     Quadriceps NT     Piriformis NT     ITBand NT  Iliopsoas NT     Gastroc Restricted Left           Balance:  SLS R: Eyes Open (EO): 20   sec.Eyes Closed (EC): NT sec.  SLS L: Eyes Open (EO): 6     sec.Eyes Closed (EC): NT sec.     Special Tests/Neurological Screen:    R L   R L   Anterior Drawer (-) (+) Windlass (-) (-)   Posterior Drawer (-) (-) Navicular Drop NT NT   Talar Tilt (-) (-) Thompsons NT NT   Valgus Stress (-) (-) Varus Stress (-) (-)                Treatment     Therapeutic Exercises - Justified to address any of the following:  To develop strength, endurance, ROM and/or flexibility.   Subjective intake for session planning    Re-measured ankle AROM    (All exercises performed barefoot today to challenge foot intrinsics)    - A/P rocks in mini squat on trampoline 2x20 with cues for motion generation from ankles and preventing valgus collapse (attempted single leg but stopped due to pain)  - Wall sit ankle DF alternating 2x20 each LE (shoes on for this one to prevent sliding)  - Gastroc Stretch on Wall  3 reps - 30 sec hold  - Runners step up on 8" step with blue therapad on top 2x10 each LE with cues to push evenly through tripod of foot  - introduced lunge with blue tb around midfoot with cuing to press through medial arch 2x10 L    Manual Therapy - Justified to address any of the following:    Mobilization of joints and soft tissues, manipulation, manual lymphatic drainage, and/or manual traction.    STM to L plantar fascia, tibialis posterior, peroneals, gastroc, soleus  Grade III A/P mobilizations talocrural joint L  Grade III A/P and P/A midfoot mobilizations L    Home Exercises   Access Code: [email or web address]  URL: [email or web address]  Date: 02/13/2023  Prepared by: Ricka Burdock    Exercises  - Long Sitting Ankle Eversion with  Resistance  - 1 x daily - 7 x weekly - 2 sets - 10 reps  - Long Sitting Ankle Inversion with Resistance  - 1 x daily - 7 x weekly - 2 sets - 10 reps  - Long Sitting Ankle Dorsiflexion with Anchored Resistance  - 1 x daily - 7 x weekly - 2 sets - 10 reps  - Toe Yoga - Alternating Great Toe and Lesser Toe Extension  - 1 x daily - 7 x weekly - 2 sets - 20 reps  - Gastroc Stretch on Wall  - 1 x daily - 7 x weekly - 1 sets - 3 reps - 30 sec hold  - Standing Eccentric Heel Raise  - 1 x daily - 7 x weekly - 2 sets - 10 reps  - Single Leg Balance with Clock Reach  - 1 x daily - 7 x weekly - 2 sets - 10 reps  - Side Stepping with Resistance at Feet  - 1 x daily - 7 x weekly - 2 sets - 10 reps  - Kneeling Ankle Dorsiflexion Self-Mobilization with Towel  - 1 x daily - 7 x weekly - 2 sets - 10 reps       ---      Flowsheet Row ---   Total Time    Timed Minutes 42 minutes   Total Time 42  minutes                      Assessment   Patient notes improvement in pain levels with ambulation since dry needling last session. No pain to palpation at needled muscle groups today but continued discomfort at lateral ankle esp ATFL area. Exercises performed today mimicking load acceptance during ambulation with occasional pains that would present for 1-2 reps of the step up exercise and then resolve with continued reps. Discussed potential mechanism as muscle re-ed as long as pain is not persisting. She will continue to benefit from skilled PT to reduce pain and restore PLOF for community ambulation.   Plan   Continue per POC. Progress load tolerance through LLE. Progress proximal control.       Goals      Goal 1: Patient will demonstrate independence in prescribed HEP with proper form, sets and reps for safe discharge to an independent program.     Sessions: 16      Goal 2: STG: Pt will verbalize understanding of underlying pain mechanism and exacerbating factors to prevent re-injury and facilitate carryover throughout POC     Sessions: 6       Goal 3: Patient will improve ankle dorsiflexion AROM with knee extended to greater than 10 degrees to allow patient to demonstrate improved gait mechanics for initial heel strike, and during stance phase and avoidance of compensatory movements to reduce injurious fall risk without pain.     Sessions: 16      Goal 4:  Improve FOTO Score to 71 points to reach predicted FOTO outcome score and safe performance of return to walking program without difficulty.   Sessions: 16          Goal 5: Patient will demonstrate ability to perform 10 single leg heel raises with knees extended to allow patient to demonstrate push off during gait to complete walking for 60 minutes to perform community ambulation without pain.     Sessions: 16                                   Annamary Rummage, DPT      Parts of this note were generated by the Epic EMR system/Dragon speech recognition and may contain inherent errors or omissions not intended by the user. Grammatical errors, random word insertions, deletions, pronoun errors and incomplete sentences are occasional consequences of this technology due to software limitations. Not all errors are caught or corrected.  If there are questions or concerns about the content of this note or information contained within the body of this dictation they should be addressed directly with the author for clarification.

## 2023-02-24 ENCOUNTER — Inpatient Hospital Stay
Payer: Medicare Other | Attending: Student in an Organized Health Care Education/Training Program | Admitting: Rehabilitative and Restorative Service Providers"

## 2023-02-24 DIAGNOSIS — M25572 Pain in left ankle and joints of left foot: Secondary | ICD-10-CM | POA: Insufficient documentation

## 2023-02-24 NOTE — PT/OT Therapy Note (Signed)
Name: Audrey Ray Age: 72 y.o.   Date of Service: 02/24/2023  Referring Physician: Gerald Dexter, MD   Date of Injury: No data found 12/02/2022  PT Date Care Plan Established/Reviewed:01/30/2023  PT Date Treatment Started:01/30/2023    End of Certification Date: 04/29/2023  Sessions in Plan of Care: 16  Surgery Date: No data was found  MD Follow-up: No data was found  Medbridge Code: No data was found    Visit Count: 9   Diagnosis:    Diagnosis ICD-10-CM Associated Order   1. Acute left ankle pain  M25.572                    Subjective     Daily Subjective   "I didn't get any pain over the weekend really it was just occasional twinges that would work themselves out quickly."    Social Support/Occupation    Lives in: multiple level home    Lives with: spouse    Occupation: retired           Precautions: hearing  osteopenia/osteoporosis  Allergies: Patient has no known allergies.    Objective              Initial Evaluation Reference and/or Current Measurements(as dated):    Girth/Edema: Other: see below    Initial R Initial L   Bimalleolar 24.0 24.8   Figure 8       Midfoot       Forefoot       (blank fields were intentionally left blank)     Range of Motion: (degrees)  Initial  Right  AROM Initial  Right PROM    Right AROM    Right PROM Ankle Initial  Left AROM Initial  Left PROM  11/27  Left AROM    Left PROM   8       Dorsiflexion 0 p!    6     60       Plantarflexion 48   50      34       Inversion 32   38      22       Eversion 18   22      126       Knee Flexion 126         0       Knee Extension 0         (blank fields were intentionally left blank)     Strength:   Initial  R    R LE Strength  PSI Initial  L    L    18.5   Ankle Dorsiflexion 15.6     19.5   Ankle Plantarflexion 17.5     9.5   Ankle Inversion 10.2     9.3   Ankle Eversion 8.7         Quadriceps           Hamstrings       22.1   Hip Abduction 12.4     (blank fields were intentionally left blank)     Flexibility:      Comment:   Hamstrings NT      Quadriceps NT     Piriformis NT     ITBand NT     Iliopsoas NT     Gastroc Restricted Left           Balance:  SLS R: Eyes Open (EO): 20   sec.Eyes  Closed (EC): NT sec.  SLS L: Eyes Open (EO): 6     sec.Eyes Closed (EC): NT sec.     Special Tests/Neurological Screen:    R L   R L   Anterior Drawer (-) (+) Windlass (-) (-)   Posterior Drawer (-) (-) Navicular Drop NT NT   Talar Tilt (-) (-) Thompsons NT NT   Valgus Stress (-) (-) Varus Stress (-) (-)                Treatment     Therapeutic Exercises - Justified to address any of the following:  To develop strength, endurance, ROM and/or flexibility.   Subjective intake for session planning    Discussion of proper footwear for upcoming trip with lots of walking planned    - Gastroc Stretch on Wall  3 reps - 30 sec hold  - Runners step up on 8" step with blue therapad on top 2x10 each LE with cues to push evenly through tripod of foot  - reviewed lunge with blue tb around midfoot with cuing to press through medial arch 2x10 L (video taped this one on patient's phone for improved compliance in HEP)  - side steps with blue tb around midfoot x10 each direction  - reviewed self ankle DF mobilization on step with blue tb x10 LLE  - SL heel raise x10 each LE    Thorough update of HEP with handout provided for her upcoming trip    Manual Therapy - Justified to address any of the following:    Mobilization of joints and soft tissues, manipulation, manual lymphatic drainage, and/or manual traction.    STM to L plantar fascia, tibialis posterior, peroneals, gastroc, soleus  Grade III A/P mobilizations talocrural joint L  Grade III A/P and P/A midfoot mobilizations L    Home Exercises   Access Code: [email or web address]  URL: [email or web address]  Date: 02/24/2023  Prepared by: Ricka Burdock    Exercises  - Toe Yoga - Alternating Great Toe and Lesser Toe Extension  - 1 x daily - 7 x weekly - 2 sets - 20 reps  - Gastroc Stretch on Wall  - 1 x daily - 7 x weekly - 1 sets - 3  reps - 30 sec hold  - Single Leg Balance with Clock Reach  - 1 x daily - 7 x weekly - 2 sets - 10 reps  - Side Stepping with Resistance at Feet  - 1 x daily - 7 x weekly - 2 sets - 10 reps  - Kneeling Ankle Dorsiflexion Self-Mobilization with Towel  - 1 x daily - 7 x weekly - 2 sets - 10 reps  - Mini Lunge  - 1 x daily - 7 x weekly - 2 sets - 10 reps  - Standing Single Leg Heel Raise  - 1 x daily - 7 x weekly - 2 sets - 10 reps  - Runner's Step Up/Down  - 1 x daily - 7 x weekly - 2 sets - 10 reps       ---      Flowsheet Row ---   Total Time    Timed Minutes 38 minutes   Total Time 38 minutes                  Assessment   Patient presenting with improving left ankle pain during ambulation now with just occasional instances of quick pain that resolve quickly. Thorough review and update of HEP performed today due  to patient leaving out of town for 2 weeks and will be performing a lot of walking. Discussed proper shoe wear and bringing 2 pairs of shoes to switch out to avoid discomfort. She will continue to benefit from skilled PT to reduce pain and restore PLOF for community ambulation.   Plan   Continue per POC. Progress load tolerance through LLE. Progress proximal control. Progress note next session.       Goals      Goal 1: Patient will demonstrate independence in prescribed HEP with proper form, sets and reps for safe discharge to an independent program.     Sessions: 16      Goal 2: STG: Pt will verbalize understanding of underlying pain mechanism and exacerbating factors to prevent re-injury and facilitate carryover throughout POC    12/2: MET - MG     Sessions: 6      Goal 3: Patient will improve ankle dorsiflexion AROM with knee extended to greater than 10 degrees to allow patient to demonstrate improved gait mechanics for initial heel strike, and during stance phase and avoidance of compensatory movements to reduce injurious fall risk without pain.     Sessions: 16      Goal 4:  Improve FOTO Score to 71  points to reach predicted FOTO outcome score and safe performance of return to walking program without difficulty.   Sessions: 16          Goal 5: Patient will demonstrate ability to perform 10 single leg heel raises with knees extended to allow patient to demonstrate push off during gait to complete walking for 60 minutes to perform community ambulation without pain.     Sessions: 16                                     Annamary Rummage, DPT      Parts of this note were generated by the Epic EMR system/Dragon speech recognition and may contain inherent errors or omissions not intended by the user. Grammatical errors, random word insertions, deletions, pronoun errors and incomplete sentences are occasional consequences of this technology due to software limitations. Not all errors are caught or corrected.  If there are questions or concerns about the content of this note or information contained within the body of this dictation they should be addressed directly with the author for clarification.

## 2023-03-10 ENCOUNTER — Inpatient Hospital Stay: Payer: Medicare Other | Admitting: Rehabilitative and Restorative Service Providers"

## 2023-03-10 DIAGNOSIS — M25572 Pain in left ankle and joints of left foot: Secondary | ICD-10-CM

## 2023-03-10 NOTE — PT/OT Therapy Note (Addendum)
 Name: Audrey Ray Age: 72 y.o.   Date of Service: 03/10/2023  Referring Physician: Gerald Dexter, MD   Date of Injury: No data found 12/02/2022  PT Date Care Plan Established/Reviewed:01/30/2023  PT Date Treatment Started:01/30/2023    End of Certific

## 2023-03-10 NOTE — Progress Notes (Signed)
 Name:Aleigh Bonna Gains Age: 72 y.o.   Date of Service: 03/10/2023  Referring Physician: Gerald Dexter, MD   Date of Injury: No data found 12/02/2022  PT Date Care Plan Established/Reviewed:01/30/2023  PT Date Treatment Started:01/30/2023  OT Date Care Plan

## 2023-03-13 NOTE — Progress Notes (Signed)
 GENETIC COUNSELING SUMMARY  Genetic test performed:   [facility] CancerNext-Expanded + RNAinsight, 76 genes    Results:   NEGATIVE    Recommendations:   Audrey Ray is considered at elevated risk for schwannomas, melanoma, ovarian, and stomach cancers

## 2023-03-14 NOTE — Progress Notes (Signed)
 Neurology Initial Consultation    Date: March 14, 2023     Name:Audrey Ray   Date of Birth: 20-Aug-1950   MRN: [id number]     CC: dizziness for months       History      This is a 72 y.o. right hand dominant female with a medical history of  ves

## 2023-03-17 ENCOUNTER — Encounter: Payer: Self-pay | Admitting: Clinical Neurophysiology

## 2023-03-17 ENCOUNTER — Ambulatory Visit: Payer: Medicare Other | Attending: Clinical Neurophysiology | Admitting: Clinical Neurophysiology

## 2023-03-17 VITALS — BP 148/79 | HR 73 | Temp 98.7°F | Resp 16 | Ht 67.0 in | Wt 128.0 lb

## 2023-03-17 DIAGNOSIS — G609 Hereditary and idiopathic neuropathy, unspecified: Secondary | ICD-10-CM | POA: Insufficient documentation

## 2023-03-17 NOTE — Patient Instructions (Signed)
Do blood work

## 2023-03-20 NOTE — Progress Notes (Signed)
 [facility]      Dear Dr. Maximino Sarin    I had the pleasure of seeing your patient in our department today for a follow up visit.  If you have any questions, please feel free to contact me.  Again, thank you for allowing me to participate in

## 2023-03-21 ENCOUNTER — Other Ambulatory Visit (INDEPENDENT_AMBULATORY_CARE_PROVIDER_SITE_OTHER): Payer: Medicare Other

## 2023-03-21 DIAGNOSIS — G609 Hereditary and idiopathic neuropathy, unspecified: Secondary | ICD-10-CM

## 2023-03-21 LAB — FOLATE
Folate: 19.9 ng/mL (ref >5.4)
Hemolysis Index: 3 {index}

## 2023-03-21 LAB — VITAMIN B12: Vitamin B-12: 951 pg/mL — ABNORMAL HIGH (ref 211–911)

## 2023-03-21 LAB — TSH: TSH: 1.34 u[IU]/mL (ref 0.35–4.94)

## 2023-03-22 LAB — COPPER: Copper: 142 ug/dL (ref 70–175)

## 2023-03-24 ENCOUNTER — Ambulatory Visit: Payer: Medicare Other | Attending: Hematology & Oncology | Admitting: Nurse Practitioner

## 2023-03-24 ENCOUNTER — Encounter: Payer: Self-pay | Admitting: Clinical Neurophysiology

## 2023-03-24 ENCOUNTER — Encounter: Payer: Self-pay | Admitting: Nurse Practitioner

## 2023-03-24 ENCOUNTER — Telehealth: Payer: Self-pay | Admitting: Nurse Practitioner

## 2023-03-24 VITALS — BP 110/71 | HR 76 | Temp 98.1°F | Resp 16 | Ht 67.0 in | Wt 134.0 lb

## 2023-03-24 DIAGNOSIS — Z809 Family history of malignant neoplasm, unspecified: Secondary | ICD-10-CM | POA: Insufficient documentation

## 2023-03-24 DIAGNOSIS — Z1239 Encounter for other screening for malignant neoplasm of breast: Secondary | ICD-10-CM | POA: Insufficient documentation

## 2023-03-24 LAB — SERUM IMMUNOFIXATION ELECTROPHORESIS WITH REVIEW: IFE Serum Interpretation: NEGATIVE

## 2023-03-24 LAB — VITAMIN B6: Vitamin B6: 104.3 ng/mL — ABNORMAL HIGH (ref 2.1–21.7)

## 2023-03-24 NOTE — Telephone Encounter (Signed)
-----   Message from Luther Redo sent at 03/24/2023 10:19 AM EST -----  Regarding: appointments  Hello,  Please schedule the following  - Breast ultrasound February 2025    Thank you,  Candise Bowens

## 2023-03-24 NOTE — Telephone Encounter (Signed)
 [place] 05/02/2023, 9:00 AM at [facility]. Pt given copy of order

## 2023-03-27 ENCOUNTER — Inpatient Hospital Stay
Payer: Medicare Other | Attending: Student in an Organized Health Care Education/Training Program | Admitting: Rehabilitative and Restorative Service Providers"

## 2023-03-27 DIAGNOSIS — M25572 Pain in left ankle and joints of left foot: Secondary | ICD-10-CM | POA: Insufficient documentation

## 2023-03-27 NOTE — Progress Notes (Signed)
Name:Audrey Ray Age: 73 y.o.   Date of Service: 03/27/2023  Referring Physician: Gerald Dexter, MD   Date of Injury: No data found 12/02/2022  PT Date Care Plan Established/Reviewed:01/30/2023  PT Date Treatment Started:01/30/2023  OT Date Care Plan Established/Reviewed: No data found  OT Date Treatment Started: No data found    (Historic) Date of Injury:No data was found  (Historic) Date Care Plan Established/Reviewed No data was found  No data was found  (Historic) Date Treatment Started No data was found No data was found    End of Certification Date: 04/29/2023  Sessions in Plan of Care: 16  Surgery Date: No data was found  MD Follow-up: No data was found  Medbridge Code: No data was found    Visit Count: 11   Diagnosis:    Diagnosis ICD-10-CM Associated Order   1. Acute left ankle pain  M25.572                     Precautions: hearing  osteopenia/osteoporosis  Allergies: Allergies[1]    Medical History[2]                    ---      Flowsheet Row ---   Total Time    Timed Minutes 30 minutes   Total Time 30 minutes        Daily Subjective   "I feel like the last needling we did was really helpful I haven't had any pain since last time. I even stepped in a hole and didn't get any pain. I am back to my speed walking 3 miles without pain."     Social Support/Occupation     Lives in: multiple level home     Lives with: spouse     Occupation: retired           Precautions: hearing  osteopenia/osteoporosis  Allergies: Patient has no known allergies.     Objective               Initial Evaluation Reference and/or Current Measurements(as dated):     Girth/Edema: Other: see below    Initial R Initial L   Bimalleolar 24.0 24.8   Figure 8       Midfoot       Forefoot       (blank fields were intentionally left blank)     Range of Motion: (degrees)  Initial  Right  AROM Initial  Right PROM    Right AROM    Right PROM Ankle Initial  Left AROM Initial  Left PROM  11/27  Left AROM  12/16  Left AROM 1/2  L AROM   8        Dorsiflexion 0 p!    6 10  10    60       Plantarflexion 48   50  50 p!  60   34       Inversion 32   38  38  32   22       Eversion 18   22  26  20    126       Knee Flexion 126           0       Knee Extension 0           (blank fields were intentionally left blank)     Strength:   Initial  R 1/2   R LE Strength  PSI  Initial  L 12/16   L  1/2  L   18.5 31.4  Ankle Dorsiflexion 15.6  17.3 38.3   19.5  32.5 Ankle Plantarflexion 17.5 22.0  30.8   9.5 14.8  Ankle Inversion 10.2  10.5 16.2   9.3 21.1  Ankle Eversion 8.7 13.1  19.0       Quadriceps             Hamstrings         22.1   Hip Abduction 12.4 22.4      (blank fields were intentionally left blank)     Flexibility:      Comment:   Hamstrings NT     Quadriceps NT     Piriformis NT     ITBand NT     Iliopsoas NT     Gastroc Restricted Left           Balance:  SLS R: Eyes Open (EO): 20   sec.Eyes Closed (EC): NT sec.  SLS L: Eyes Open (EO): 6     sec.Eyes Closed (EC): NT sec.     12/16: SLS EO L 30 sec with slight discomfort on lateral ankle     Special Tests/Neurological Screen:    R L   R L   Anterior Drawer (-) (+) Windlass (-) (-)   Posterior Drawer (-) (-) Navicular Drop NT NT   Talar Tilt (-) (-) Thompsons NT NT   Valgus Stress (-) (-) Varus Stress (-) (-)                  Treatment      Therapeutic Exercises - Justified to address any of the following:  To develop strength, endurance, ROM and/or flexibility.   Subjective intake for session planning     Re-measures performed for progress note     Reviewed full HEP program with education/instruction on how to progress exercises (frequency/duration) with sets, reps, hold times, and resistance to promote continued improvement for ankle flexibility/strength/balance.  Special attention given to instruct patient to not push HEP to the point of pain and to contact our office if questions occur. Advised to follow-up with MD as prescribed/as needed.     Handout provided to patient with above information and phone  numbers.     Exercises  - Toe Yoga - Alternating Great Toe and Lesser Toe Extension  - 1 x daily - 7 x weekly - 2 sets - 20 reps  - Gastroc Stretch on Wall  - 1 x daily - 7 x weekly - 1 sets - 3 reps - 30 sec hold  - Single Leg Balance with Clock Reach  - 1 x daily - 7 x weekly - 2 sets - 10 reps  - Side Stepping with Resistance at Feet  - 1 x daily - 7 x weekly - 2 sets - 10 reps  - Standing Single Leg Heel Raise  - 1 x daily - 7 x weekly - 2 sets - 10 reps  - Runner's Step Up/Down  - 1 x daily - 7 x weekly - 2 sets - 10 reps  - Curtsy Squat  - 1 x daily - 7 x weekly - 2 sets - 10 reps  - Forward Step Down Touch with Heel  - 1 x daily - 7 x weekly - 2 sets - 10 reps  - Kneeling Ankle Dorsiflexion Self-Mobilization with Towel  - 1 x daily - 7 x weekly - 2 sets -  10 reps     Manual Therapy - Justified to address any of the following:    Mobilization of joints and soft tissues, manipulation, manual lymphatic drainage, and/or manual traction.    STM to L plantar fascia, tibialis posterior, peroneals, gastroc, soleus  Grade III A/P mobilizations talocrural joint L  Grade III A/P and P/A midfoot mobilizations L     Home Exercises   Access Code: [email or web address]  URL: [email or web address]  Date: 03/27/2023  Prepared by: Ricka Burdock     Exercises  - Toe Yoga - Alternating Great Toe and Lesser Toe Extension  - 1 x daily - 7 x weekly - 2 sets - 20 reps  - Gastroc Stretch on Wall  - 1 x daily - 7 x weekly - 1 sets - 3 reps - 30 sec hold  - Single Leg Balance with Clock Reach  - 1 x daily - 7 x weekly - 2 sets - 10 reps  - Side Stepping with Resistance at Feet  - 1 x daily - 7 x weekly - 2 sets - 10 reps  - Standing Single Leg Heel Raise  - 1 x daily - 7 x weekly - 2 sets - 10 reps  - Runner's Step Up/Down  - 1 x daily - 7 x weekly - 2 sets - 10 reps  - Curtsy Squat  - 1 x daily - 7 x weekly - 2 sets - 10 reps  - Forward Step Down Touch with Heel  - 1 x daily - 7 x weekly - 2 sets - 10 reps  - Kneeling Ankle  Dorsiflexion Self-Mobilization with Towel  - 1 x daily - 7 x weekly - 2 sets - 10 reps                Assessment   Audrey Ray has been seen for 11 sessions of physical therapy for L ankle sprain. Pt has progressed well with skilled physical therapy as demonstrated by improved Balance, Flexibility/ROM, Stabilization, and Strength and functional outcomes. Pt has been instructed in comprehensive HEP and demonstrates understanding for self maintenance. Pt has been advised to continue HEP and follow up with PT or MD if any further issues arise.      Today's treatment session was completed with participation and/or observation by Benita Stabile, SPT. All assessments and treatment interventions were guided and performed under my direct supervision and I was physically present during the entire treatment session.  Plan   D/C to HEP        Goals      Goal 1: Patient will demonstrate independence in prescribed HEP with proper form, sets and reps for safe discharge to an independent program.    12/16: patient with inconsistent performance while traveling but when home she is performing daily - MG  03/27/23: MET - MG   Sessions: 16      Goal 2: STG: Pt will verbalize understanding of underlying pain mechanism and exacerbating factors to prevent re-injury and facilitate carryover throughout POC    12/2: MET - MG     Sessions: 6      Goal 3: Patient will improve ankle dorsiflexion AROM with knee extended to greater than 10 degrees to allow patient to demonstrate improved gait mechanics for initial heel strike, and during stance phase and avoidance of compensatory movements to reduce injurious fall risk without pain.    12/16: MET - MG     Sessions: 16      Goal 4:  Improve FOTO Score to 71 points to reach predicted FOTO outcome score and safe performance of return to walking program without difficulty.    12/16: partially met - score now up to 73 but still having discomfort with prolonged walking on uneven surfaces - MG  03/27/23: MET -  MG   Sessions: 16          Goal 5: Patient will demonstrate ability to perform 10 single leg heel raises with knees extended to allow patient to demonstrate push off during gait to complete walking for 60 minutes to perform community ambulation without pain.    12/16: partially met, still having pain with prolonged walking especially on uneven surfaces - MG  03/27/23: MET - MG   Sessions: 16                           Annamary Rummage, DPT      Parts of this note were generated by the Epic EMR system/Dragon speech recognition and may contain inherent errors or omissions not intended by the user. Grammatical errors, random word insertions, deletions, pronoun errors and incomplete sentences are occasional consequences of this technology due to software limitations. Not all errors are caught or corrected.  If there are questions or concerns about the content of this note or information contained within the body of this dictation they should be addressed directly with the author for clarification.         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    Colon polyp     Constipation 06/2016    Depression 1990    Fecal incontinence     Gastric ulceration 07/2016    Gastroesophageal reflux disease     Controlled with diet    Headache 1990    Left side migraines- controlled by pamalor    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, right ear; has Left ear hearing aid    Helicobacter pylori infection     History of acoustic neuroma     surgery 2015    Hormone replacement therapy (HRT)     Hyperlipidemia     Controlled diet and exercise    Pericardial effusion 2017    "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

## 2023-03-27 NOTE — PT/OT Therapy Note (Signed)
Name: Audrey Ray Age: 73 y.o.   Date of Service: 03/27/2023  Referring Physician: Gerald Dexter, MD   Date of Injury: No data found 12/02/2022  PT Date Care Plan Established/Reviewed:01/30/2023  PT Date Treatment Started:01/30/2023    End of Certification Date: 04/29/2023  Sessions in Plan of Care: 16  Surgery Date: No data was found  MD Follow-up: No data was found  Medbridge Code: No data was found    Visit Count: 11   Diagnosis:    Diagnosis ICD-10-CM Associated Order   1. Acute left ankle pain  M25.572                        Subjective     Daily Subjective   "I feel like the last needling we did was really helpful I haven't had any pain since last time. I even stepped in a hole and didn't get any pain. I am back to my speed walking 3 miles without pain."    Social Support/Occupation    Lives in: multiple level home    Lives with: spouse    Occupation: retired           Precautions: hearing  osteopenia/osteoporosis  Allergies: Patient has no known allergies.    Objective              Initial Evaluation Reference and/or Current Measurements(as dated):    Girth/Edema: Other: see below    Initial R Initial L   Bimalleolar 24.0 24.8   Figure 8       Midfoot       Forefoot       (blank fields were intentionally left blank)     Range of Motion: (degrees)  Initial  Right  AROM Initial  Right PROM    Right AROM    Right PROM Ankle Initial  Left AROM Initial  Left PROM  11/27  Left AROM  12/16  Left AROM 1/2  L AROM   8       Dorsiflexion 0 p!    6 10  10    60       Plantarflexion 48   50  50 p!  60   34       Inversion 32   38  38  32   22       Eversion 18   22  26  20    126       Knee Flexion 126          0       Knee Extension 0          (blank fields were intentionally left blank)     Strength:   Initial  R 1/2   R LE Strength  PSI Initial  L 12/16   L  1/2  L   18.5 31.4  Ankle Dorsiflexion 15.6  17.3 38.3   19.5  32.5 Ankle Plantarflexion 17.5 22.0  30.8   9.5 14.8  Ankle Inversion 10.2  10.5 16.2   9.3 21.1   Ankle Eversion 8.7 13.1  19.0       Quadriceps            Hamstrings        22.1   Hip Abduction 12.4 22.4     (blank fields were intentionally left blank)     Flexibility:      Comment:   Hamstrings NT     Quadriceps NT  Piriformis NT     ITBand NT     Iliopsoas NT     Gastroc Restricted Left           Balance:  SLS R: Eyes Open (EO): 20   sec.Eyes Closed (EC): NT sec.  SLS L: Eyes Open (EO): 6     sec.Eyes Closed (EC): NT sec.    12/16: SLS EO L 30 sec with slight discomfort on lateral ankle     Special Tests/Neurological Screen:    R L   R L   Anterior Drawer (-) (+) Windlass (-) (-)   Posterior Drawer (-) (-) Navicular Drop NT NT   Talar Tilt (-) (-) Thompsons NT NT   Valgus Stress (-) (-) Varus Stress (-) (-)                Treatment     Therapeutic Exercises - Justified to address any of the following:  To develop strength, endurance, ROM and/or flexibility.   Subjective intake for session planning    Re-measures performed for progress note    Reviewed full HEP program with education/instruction on how to progress exercises (frequency/duration) with sets, reps, hold times, and resistance to promote continued improvement for ankle flexibility/strength/balance.  Special attention given to instruct patient to not push HEP to the point of pain and to contact our office if questions occur. Advised to follow-up with MD as prescribed/as needed.    Handout provided to patient with above information and phone numbers.    Exercises  - Toe Yoga - Alternating Great Toe and Lesser Toe Extension  - 1 x daily - 7 x weekly - 2 sets - 20 reps  - Gastroc Stretch on Wall  - 1 x daily - 7 x weekly - 1 sets - 3 reps - 30 sec hold  - Single Leg Balance with Clock Reach  - 1 x daily - 7 x weekly - 2 sets - 10 reps  - Side Stepping with Resistance at Feet  - 1 x daily - 7 x weekly - 2 sets - 10 reps  - Standing Single Leg Heel Raise  - 1 x daily - 7 x weekly - 2 sets - 10 reps  - Runner's Step Up/Down  - 1 x daily - 7 x weekly - 2  sets - 10 reps  - Curtsy Squat  - 1 x daily - 7 x weekly - 2 sets - 10 reps  - Forward Step Down Touch with Heel  - 1 x daily - 7 x weekly - 2 sets - 10 reps  - Kneeling Ankle Dorsiflexion Self-Mobilization with Towel  - 1 x daily - 7 x weekly - 2 sets - 10 reps    Manual Therapy - Justified to address any of the following:    Mobilization of joints and soft tissues, manipulation, manual lymphatic drainage, and/or manual traction.    STM to L plantar fascia, tibialis posterior, peroneals, gastroc, soleus  Grade III A/P mobilizations talocrural joint L  Grade III A/P and P/A midfoot mobilizations L    Home Exercises   Access Code: [email or web address]  URL: [email or web address]  Date: 03/27/2023  Prepared by: Ricka Burdock    Exercises  - Toe Yoga - Alternating Great Toe and Lesser Toe Extension  - 1 x daily - 7 x weekly - 2 sets - 20 reps  - Gastroc Stretch on Wall  - 1 x daily - 7 x weekly - 1 sets - 3 reps -  30 sec hold  - Single Leg Balance with Clock Reach  - 1 x daily - 7 x weekly - 2 sets - 10 reps  - Side Stepping with Resistance at Feet  - 1 x daily - 7 x weekly - 2 sets - 10 reps  - Standing Single Leg Heel Raise  - 1 x daily - 7 x weekly - 2 sets - 10 reps  - Runner's Step Up/Down  - 1 x daily - 7 x weekly - 2 sets - 10 reps  - Curtsy Squat  - 1 x daily - 7 x weekly - 2 sets - 10 reps  - Forward Step Down Touch with Heel  - 1 x daily - 7 x weekly - 2 sets - 10 reps  - Kneeling Ankle Dorsiflexion Self-Mobilization with Towel  - 1 x daily - 7 x weekly - 2 sets - 10 reps       ---      Flowsheet Row ---   Total Time    Timed Minutes 30 minutes   Total Time 30 minutes                  Assessment   Audrey Ray has been seen for 11 sessions of physical therapy for L ankle sprain. Pt has progressed well with skilled physical therapy as demonstrated by improved Balance, Flexibility/ROM, Stabilization, and Strength and functional outcomes. Pt has been instructed in comprehensive HEP and demonstrates understanding for  self maintenance. Pt has been advised to continue HEP and follow up with PT or MD if any further issues arise.     Today's treatment session was completed with participation and/or observation by Benita Stabile, SPT. All assessments and treatment interventions were guided and performed under my direct supervision and I was physically present during the entire treatment session.  Plan   D/C to HEP      Goals      Goal 1: Patient will demonstrate independence in prescribed HEP with proper form, sets and reps for safe discharge to an independent program.    12/16: patient with inconsistent performance while traveling but when home she is performing daily - MG  03/27/23: MET - MG   Sessions: 16      Goal 2: STG: Pt will verbalize understanding of underlying pain mechanism and exacerbating factors to prevent re-injury and facilitate carryover throughout POC    12/2: MET - MG     Sessions: 6      Goal 3: Patient will improve ankle dorsiflexion AROM with knee extended to greater than 10 degrees to allow patient to demonstrate improved gait mechanics for initial heel strike, and during stance phase and avoidance of compensatory movements to reduce injurious fall risk without pain.    12/16: MET - MG     Sessions: 16      Goal 4:  Improve FOTO Score to 71 points to reach predicted FOTO outcome score and safe performance of return to walking program without difficulty.    12/16: partially met - score now up to 73 but still having discomfort with prolonged walking on uneven surfaces - MG  03/27/23: MET - MG   Sessions: 16          Goal 5: Patient will demonstrate ability to perform 10 single leg heel raises with knees extended to allow patient to demonstrate push off during gait to complete walking for 60 minutes to perform community ambulation without pain.    12/16: partially met,  still having pain with prolonged walking especially on uneven surfaces - MG  03/27/23: MET - MG   Sessions: 16                                          Audrey Ray, DPT      Parts of this note were generated by the Epic EMR system/Dragon speech recognition and may contain inherent errors or omissions not intended by the user. Grammatical errors, random word insertions, deletions, pronoun errors and incomplete sentences are occasional consequences of this technology due to software limitations. Not all errors are caught or corrected.  If there are questions or concerns about the content of this note or information contained within the body of this dictation they should be addressed directly with the author for clarification.

## 2023-06-04 DIAGNOSIS — I340 Nonrheumatic mitral (valve) insufficiency: Secondary | ICD-10-CM | POA: Insufficient documentation

## 2023-06-04 DIAGNOSIS — I3139 Other pericardial effusion (noninflammatory): Secondary | ICD-10-CM | POA: Insufficient documentation

## 2023-06-04 NOTE — Progress Notes (Signed)
 [place]  HEART CARDIOLOGY OFFICE CONSULTATION NOTE    HRT FAIR [facility]  [place]  HEART FAIR [facility] OFFICE -CARDIOLOGY  [address]  [place] [address]  Dept: [phone]  Dept Fax: [phone]         Patient Name: Audrey Ray    Date of Visit:  June 09, 2023  Date of Birth: 1951/02/02  AGE: 73 y.o.  Medical Record #: [id number]  Requesting Physician: Richmond JAYSON Guard, MD      CHIEF COMPLAINT:  Pericardial effusion    HISTORY OF PRESENT ILLNESS    [name] is being seen today for cardiovascular evaluation at the request of Richmond JAYSON Guard, MD. She is a pleasant 73 y.o. female who presents for evaluation of dizziness, labile blood pressures, mild mitral regurgitation, small pericardial effusion.  She had an echo with us  4 years ago, showing mild MR, redundant mitral leaflets, small hemodynamically insignificant pericardial effusion.  There is no other cardiac history.    She has a history of a resected schwannoma.  She has a history of migraines.    Recently she has had an increased burden of lightheadedness with standing.  It is becoming meaningfully impactful.  She started to check her blood pressures and there is been fairly marked lability.  She has had systolic blood pressures as low as 98 mmHg and as high as 161 mmHg.    She has seen a neurologist, and so far the evaluation there/reevaluation there, along with an ENT reevaluation, has been benign.    She is an active lady, enjoying a variety/spectrum of exercises, and she feels well with those without chest pain dyspnea or palpitations.      PAST MEDICAL HISTORY: She has a past medical history of Abnormal vision, Arthritis (10/2014), Cochlear implant in place, Colon polyp, Constipation (06/2016), Depression (1990), Fecal incontinence, Gastric ulceration (07/2016), Gastroesophageal reflux disease, Headache (1990), Hearing loss NEC (2015), Helicobacter pylori infection, History of acoustic neuroma, Hormone replacement therapy (HRT),  Hyperlipidemia, Pericardial effusion (2017), and Post-operative nausea and vomiting (1980). She has a past surgical history that includes Cesarean section (1985); CRANIOTOMY, TRANS-PETROSAL APPROACH (04/14/2013); MASTOIDECTOMY, MODIFIED (04/14/2013); TYMPANOPLASTY, EXPLORATION , REMOVAL CHOLESTEATOMA (04/19/2013); MASTOIDECTOMY, MODIFIED (07/05/2013); MYRINGOPLASTY, FAT GRAFT (07/05/2013); INSERTION, OSSEOINTEGRATED IMP/TEMPORAL BONE/BAHA (07/05/2013); Adenoidectomy (1960); Hysterectomy (1995); Tonsillectomy (1960); Knee arthroscopy w/ meniscal repair (Left, 1999); ARTHROSCOPY, KNEE (Right, 11/29/2014); EGD, COLONOSCOPY (N/A, 08/02/2016); EGD (N/A, 10/11/2016); Brain surgery (2015); 24, 48, OR 96 HOUR AMBULATORY ESOPHAGEAL PH MONITORING (BRAVO) (N/A, 12/01/2018); INSERTION, OSSEOINTEGRATED IMP/TEMPORAL BONE/BAHA (Right, 11/23/2019); COLONOSCOPY, DIAGNOSTIC (SCREENING) (N/A, 12/17/2021); EGD, BIOPSY (N/A, 12/17/2021); Oophorectomy (1995); and Other surgical history (Acoustic neuroma 2015).    Allergies  Review status set to Review Complete by Letitia Romberg, MA on 03/24/2023   No Known Allergies          MEDICATIONS:   Patient's current medications were reviewed. ONLY Cardiac medications were updated unless others were addressed in assessment and plan.    No current outpatient medications relevant to Cardiology on file.     Current Outpatient Medications (Other)   Medication Sig    zolpidem  Take 1 tablet (6.25 mg) by mouth nightly as needed for Sleep    calcium  carbonate-vitamin D  Take 1 tablet by mouth every evening    nortriptyline  Take 2 capsules (50 mg) by mouth nightly    polyethylene glycol Take 17 g by mouth as needed    cetirizine Take 1 tablet (5 mg) by mouth every evening       FAMILY HISTORY:  family history includes Coronary artery disease in her father; Heart failure in her father; Melanoma in her father; Ovarian cancer in her mother; Stent in her brother; Stomach cancer in her maternal  aunt.    SOCIAL HISTORY: She reports that she quit smoking about 52 years ago. Her smoking use included cigarettes. She started smoking about 54 years ago. She has a 1 pack-year smoking history. She has never used smokeless tobacco. She reports current alcohol use of about 5.0 standard drinks of alcohol per week. She reports that she does not use drugs.    PHYSICAL EXAMINATION    Visit Vitals  BP 138/90 (BP Site: Left arm, Patient Position: Sitting, Cuff Size: Small)   Pulse 71   Ht 1.715 m (5' 7.5)   Wt 59 kg (130 lb)   BMI 20.06 kg/m        Constitutional: Cooperative, alert, no acute distress.  Thin.  Neck: No carotid bruits, JVP normal.  Cardiac: Regular rate and rhythm, normal S1 and S2; soft S4.  Pulmonary: Clear to auscultation bilaterally, no wheezing, no rhonchi, no rales.  Extremities: no edema.  Vascular: +2 pulses in radial artery bilaterally, 2+ pedal pulses bilaterally.      ECG: Sinus rhythm.  Biatrial enlargement.      LABS REVIEWED:   Lab Results   Component Value Date    WBC 5.54 12/18/2015    HGB 14.2 12/18/2015    HCT 41.7 12/18/2015    PLT 116 (L) 12/18/2015     Lab Results   Component Value Date    GLU 91 12/18/2015    BUN 17 12/18/2015    CREAT 1.0 12/18/2015    NA 141 12/18/2015    K 4.7 12/18/2015    CL 107 12/18/2015    CO2 26 12/18/2015    AST 39 (H) 10/29/2014    ALT 27 10/29/2014     Lab Results   Component Value Date    TSH 1.34 03/21/2023     No results found for: CHOL, TRIG, HDL, LDL  No results found for: LPACHOL    Echo 09/2019:    Small hemodynamically insignificant pericardial effusion  Mild MR  Otherwise normal study      IMPRESSION:   [name] is a 73 y.o. female with the following problems:    Orthostatic lightheadedness suggestive of orthostatic hypotension  Labile blood pressures with a range from low normal systolic blood pressures to moderately elevated systolic blood pressures.  Overall, suspicion for at least mild autonomic insufficiency  Status post  schwannoma resection.  Stable follow-up imaging  Migraine headache syndrome on Pamelor   Echo 2021 with small hemodynamically insignificant pericardial effusion and mild mitral regurgitation      RECOMMENDATIONS:    Check a.m. cortisol level-rule out adrenal insufficiency  Check repeat echocardiogram for any structural mechanical factors that could contribute to hypotension and to reassess MR, pericardial effusion.  Also rule out any hypertensive heart disease impacts  We talked about maintaining good hydration.  We talked about orthostatic precautions  We talked about using compression hose  We talked about the DASH diet to mitigate against her higher blood pressure readings  For now I would wish to avoid any antihypertensive medications unless absolutely mandatory.  I worry that they could exacerbate the lightheadedness side of this equation  Continue to monitor blood pressure at least a couple times per week  APP visit in about 2 months to reassess  Continue close follow-up with neurology.  1 factor  that could contribute to the orthostasis is her nortriptyline .  It is anticholinergic impacts could contribute to this issue.  Also, rule out any neurodegenerative processes with blood pressure impacts akin to what she is starting to show.  Rule out parkinsonism, etc.  It appears those phenomenon are far less likely in her based on available evidence                                                 Orders Placed This Encounter   Procedures    Cortisol, AM    ECG 12 lead (Normal)    Echo 2D Complete    APP Office Visit (HRT [place])       No orders of the defined types were placed in this encounter.        SIGNED:    Glean FORBES Aus, MD         This note was generated by the Dragon speech recognition and may contain errors or omissions not intended by the user. Grammatical errors, random word insertions, deletions, pronoun errors, and incomplete sentences are occasional consequences of this technology due to software  limitations. Not all errors are caught or corrected. If there are questions or concerns about the content of this note or information contained within the body of this dictation, they should be addressed directly with the author for clarification.

## 2023-06-05 ENCOUNTER — Telehealth (INDEPENDENT_AMBULATORY_CARE_PROVIDER_SITE_OTHER): Payer: Self-pay

## 2023-06-05 NOTE — Telephone Encounter (Signed)
 Pt calling stating she received a message asking her to complete labs prior to her OV on Monday to re-establish care. [place] of date of request. No documentation noted. Inquired if pt has had lab work completed with PCP since December labs that are available in chart for which pt confirms she completed labs last month. Advised will update Dr Ruta clinical team in an effort to get results prior to OV. Pt verbalized understanding.

## 2023-06-06 NOTE — Telephone Encounter (Signed)
 Called PCP to request latest labs, office visit notes, and EKG to be faxed to [phone].

## 2023-06-09 ENCOUNTER — Encounter (INDEPENDENT_AMBULATORY_CARE_PROVIDER_SITE_OTHER): Payer: Self-pay | Admitting: Cardiovascular Disease

## 2023-06-09 ENCOUNTER — Ambulatory Visit (INDEPENDENT_AMBULATORY_CARE_PROVIDER_SITE_OTHER): Payer: Medicare Other | Admitting: Cardiovascular Disease

## 2023-06-09 VITALS — BP 138/90 | HR 71 | Ht 67.5 in | Wt 130.0 lb

## 2023-06-09 DIAGNOSIS — I951 Orthostatic hypotension: Secondary | ICD-10-CM | POA: Insufficient documentation

## 2023-06-09 DIAGNOSIS — I340 Nonrheumatic mitral (valve) insufficiency: Secondary | ICD-10-CM

## 2023-06-09 DIAGNOSIS — I3139 Other pericardial effusion (noninflammatory): Secondary | ICD-10-CM

## 2023-06-09 DIAGNOSIS — R030 Elevated blood-pressure reading, without diagnosis of hypertension: Secondary | ICD-10-CM | POA: Insufficient documentation

## 2023-06-09 LAB — ECG 12-LEAD
Atrial Rate: 71 {beats}/min
IHS MUSE NARRATIVE AND IMPRESSION: NORMAL
P Axis: 76 degrees
P-R Interval: 144 ms
Q-T Interval: 402 ms
QRS Duration: 84 ms
QTC Calculation (Bezet): 436 ms
R Axis: 55 degrees
T Axis: 69 degrees
Ventricular Rate: 71 {beats}/min

## 2023-06-10 ENCOUNTER — Other Ambulatory Visit (HOSPITAL_BASED_OUTPATIENT_CLINIC_OR_DEPARTMENT_OTHER)

## 2023-06-10 ENCOUNTER — Encounter (INDEPENDENT_AMBULATORY_CARE_PROVIDER_SITE_OTHER): Payer: Self-pay | Admitting: Cardiovascular Disease

## 2023-06-10 DIAGNOSIS — I951 Orthostatic hypotension: Secondary | ICD-10-CM

## 2023-06-10 LAB — CORTISOL, AM: Cortisol, AM: 13 ug/dL (ref 3.7–19.4)

## 2023-06-11 ENCOUNTER — Encounter (INDEPENDENT_AMBULATORY_CARE_PROVIDER_SITE_OTHER): Payer: Self-pay

## 2023-06-26 NOTE — Progress Notes (Signed)
 Neurology Progress Note                                         Date: June 26, 2023   Patient Name: Audrey Ray, Audrey Ray  CC: dizziness f/up           This is a 73 y.o. 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 Has since seen cardiology, wondering if nortriptyline  is contributing. No tremor. Works with systems analyst on balance, has good balance. No numbness, no tingling. No hx of seizure like activity.    02/2023  She saw a balance doctor who evaluated and treated for crystals, no improvement. She has had MRI Brain and CTH.      She had a vestibular schwannoma removed about 10 years ago. The dizziness started only this past year. It is a vertigo sensation. She is having right ear tinnitus. No vision changes.      Takes nortriptyline  50mg  at night.      She gets headaches (left sided stabbing pain). Dizziness is not associated. No tremor. No LOC. No chest pain, no palpitations.      Eats balanced meals. Does take a multivitamin.     No significant numbness/tingling.     1 cup/coffee in the morning.      Dad had heart disease, mom had ovarian cancer     Notes that word finding trouble will be better if she pauses and waits. Was a chief technology officer, retired in 1995.     Dad was in his 81s when diagnosed with dementia.      No bowel/bladder accidents. No hx of seizures     Manages her own medications     No choking.      Assistive Devices using: none     Social History:  Smoking: only smoked 2 years in college  ETOH: (-)  IVDA: (-)  Exposures:hx of right acoustic neuroma removed, had a CSF leak afterwards for which she was operated on 4 times (ear and nasal),   Driving status: yes, no accidents    Assessment and Plan:     This is a 73 y.o. 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 Referring provider note reviewed. MRI w/ evidence of prior right sided surgery and encephalomalacia. Low suspicion of vestibular migraines. Blood work with elevated B12 and B6 levels, B6 remains high. Low suspicion of underlying neuropathy, has good balance per personal trainer. Discussed how history of vestibular schwannoma with complicated course may predispose her to this sensation of dizziness indefinitely, would recommend doing her vestibular therapy exercises on a regular basis to mitigate impact on quality of life. We will also taper off nortriptyline     Her cardiologist notes orthostatic hypotension. We will plan to obtain orthostatic vitals at follow up. Low suspicion of Parkinson's given lack of tremor, will evaluate.     Discussed word finding trouble, low suspicion of underlying MCI, neurocognitive disorder. She recently had neuropsych testing done which were reportedly normal. We received an incomplete report, will have office obtain remaining report pages.    Tapering off nortriptyline  as described above, suspect her migraines will  not return with the same severity as before but if they do, we will consider alternative migraine medication at follow up    The patient expresses understanding of the medical treatment plan including risks and benefits and understands the importance of compliance with medical advice. The patient agrees to comply with instructions as dictated and will follow up with health care providers as advised.         Medical Hx     Past Medical History:   Diagnosis Date    Abnormal vision     glasses    Arthritis 10/2014    right knee    Cochlear implant in place     removed 2021    Colon polyp     Constipation 06/2016    Depression 1990    Fecal incontinence     Gastric ulceration 07/2016    Gastroesophageal reflux disease     Controlled with diet    Headache 1990    Left side migraines-  controlled by pamalor    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, right ear; has Left ear hearing aid    Helicobacter pylori infection     History of acoustic neuroma     surgery 2015    Hormone replacement therapy (HRT)     Hyperlipidemia     Controlled diet and exercise    Pericardial effusion 2017    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; 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 per 10/14/19 Echo in Epic    Post-operative nausea and vomiting 1980    controlled with pre-medication        Meds   @MEDSDIVIDED @      Allergies    Thiopental    Review of Systems   ROS     Physical Exam:   There were no vitals filed for this visit.   Physical Exam     Gen:  Well-developed, well-nourished.  No acute distress.  Cooperative with exam.  HEENT:  Normocephalic, atraumatic. Trachea midline  Neck: Normal range of motion.   Lungs:  Normal effort.  Psych: Mood and affect are appropriate.    Neuro Exam:  MENTAL STATUS:  Awake, alert, oriented x 3.  Follows commands. Speech is fluent, non dysarthric.   CRANIAL NERVES:    CN III, IV, VI - EOM full  CN VII - Facial movements symmetric.   CN VIII - Hearing intact to conversational speech.   CN IX, X - Normal phonation.  CN XI - Shoulder shrug symmetric.   CN XII - Tongue protrusion midline  MOTOR:  No pronator drift.  Antigravity throughout.  COORDINATION: Finger to nose without dysmetria. Rapid alternating movements are smooth and fast.   GAIT:  Normal gait.     Labs:   No results found for: HGBA1C, LIPID, ESR, CRP   Appointment on 06/10/2023   Component Date Value Ref Range Status    Cortisol, AM 06/10/2023 13.0  3.7 - 19.4 ug/dL Final   Office Visit on 06/09/2023   Component Date Value Ref Range Status    Ventricular Rate 06/09/2023 71  BPM Final    Atrial Rate 06/09/2023 71  BPM Final    P-R Interval 06/09/2023 144  ms Final    QRS Duration 06/09/2023 84  ms Final    Q-T Interval 06/09/2023 402  ms Final    QTC Calculation (Bezet) 06/09/2023 436  ms Final     P Axis 06/09/2023 76  degrees Final  R Axis 06/09/2023 55  degrees Final    T Axis 06/09/2023 69  degrees Final    IHS MUSE NARRATIVE AND IMPRESSION 06/09/2023    Final                    Value:NORMAL SINUS RHYTHM  BIATRIAL ENLARGEMENT  No acute findings  Confirmed by SHEIFER MD, STUART ([id number]) on 06/09/2023 2:53:32 PM        No results found.

## 2023-06-27 ENCOUNTER — Encounter: Payer: Self-pay | Admitting: Clinical Neurophysiology

## 2023-06-27 ENCOUNTER — Ambulatory Visit: Payer: Medicare Other | Attending: Clinical Neurophysiology | Admitting: Clinical Neurophysiology

## 2023-06-27 VITALS — Ht 67.0 in | Wt 130.0 lb

## 2023-06-27 DIAGNOSIS — G43009 Migraine without aura, not intractable, without status migrainosus: Secondary | ICD-10-CM

## 2023-06-27 NOTE — Patient Instructions (Addendum)
 Decrease nortriptyline/pamelor to 1 tablet at night. After 1 month, you can stop the medication

## 2023-07-23 ENCOUNTER — Ambulatory Visit
Admission: RE | Admit: 2023-07-23 | Discharge: 2023-07-23 | Disposition: A | Discharge status: Home / Self Care | Payer: Self-pay | Source: Ambulatory Visit | Attending: Cardiovascular Disease | Admitting: Cardiovascular Disease

## 2023-07-23 ENCOUNTER — Encounter (INDEPENDENT_AMBULATORY_CARE_PROVIDER_SITE_OTHER): Payer: Self-pay | Admitting: Cardiovascular Disease

## 2023-07-23 DIAGNOSIS — I340 Nonrheumatic mitral (valve) insufficiency: Secondary | ICD-10-CM | POA: Insufficient documentation

## 2023-07-23 DIAGNOSIS — I951 Orthostatic hypotension: Secondary | ICD-10-CM | POA: Insufficient documentation

## 2023-07-23 LAB — ECHO ADULT TTE COMPLETE
AV Area (Cont Eq VTI): 2.8592
AV Mean Gradient: 4
AV Peak Velocity: 1.37
Ao Root Diameter (2D): 2.8
BP Mod LV Ejection Fraction: 68
IVS Diastolic Thickness (2D): 1
LA Dimension (2D): 3.1
LA Volume Index (BP A-L): 24.0271
LVID diastole (2D): 3.9
LVID systole (2D): 2.2
MV E/A: 1
MV E/e' (Average): 12.9304
Prox Ascending Aorta Diameter: 2.9
Pulmonary Valve Findings: NORMAL
RV Basal Diastolic Dimension: 2.8
RV Systolic Pressure: 24.16
TAPSE: 1.8
Tricuspid Valve Findings: NORMAL

## 2023-08-06 ENCOUNTER — Encounter (INDEPENDENT_AMBULATORY_CARE_PROVIDER_SITE_OTHER): Admitting: Adult Health

## 2023-08-28 ENCOUNTER — Encounter (INDEPENDENT_AMBULATORY_CARE_PROVIDER_SITE_OTHER): Admitting: Adult Health

## 2023-09-07 ENCOUNTER — Encounter (INDEPENDENT_AMBULATORY_CARE_PROVIDER_SITE_OTHER): Payer: Self-pay | Admitting: Cardiovascular Disease

## 2023-09-07 NOTE — Progress Notes (Signed)
 [place]  HEART CARDIOLOGY OFFICE NOTE    HRT FAIR [facility]  [place]  HEART FAIR [facility] OFFICE -CARDIOLOGY  [address]  [place] [address]  Dept: [phone]  Dept Fax: [phone]         Patient Name: Audrey Ray    Date of Visit:  September 09, 2023  Date of Birth: May 16, 1950  AGE: 73 y.o.  Medical Record #: [id number]  Requesting Physician: Richmond JAYSON Guard, MD      CHIEF COMPLAINT:  Pericardial effusion    HISTORY OF PRESENT ILLNESS    [name] is being seen today for follow-up of prehypertension, orthostatic hypotension, small hemodynamically insignificant pericardial effusion.  With regard to her presenting issues, for which I saw her a few months back, she is doing much better.  She is following a DASH diet.  She is getting regular exercise.  She is focusing on hydration.  Her nortriptyline  was stopped.  Thankfully all of this seems to have worked.  She tracks her blood pressures and coats them as green so long as the systolic blood pressures less than 130.  Over the last multiple recordings they have nearly always been in the green zone.  Also the orthostasis has largely resolved.  She is interested in pursuing a coronary calcium  scoring exam.      PAST MEDICAL HISTORY: She has a past medical history of Abnormal vision, Arthritis (10/2014), Balance problem (03/2013), Cochlear implant in place, Colon polyp, Constipation (06/2016), Depression (1990), Dizziness, Fall (12/02/2022), Fecal incontinence, Gastric ulceration (07/2016), Gastroesophageal reflux disease, Headache (1990), Hearing loss NEC (2015), Helicobacter pylori infection, History of acoustic neuroma, Hormone replacement therapy (HRT), Hyperlipidemia, Memory loss, Migraine, Pericardial effusion (2017), and Post-operative nausea and vomiting (1980). She has a past surgical history that includes Cesarean section (1985); CRANIOTOMY, TRANS-PETROSAL APPROACH (04/14/2013); MASTOIDECTOMY, MODIFIED (04/14/2013); TYMPANOPLASTY, EXPLORATION  , REMOVAL CHOLESTEATOMA (04/19/2013); MASTOIDECTOMY, MODIFIED (07/05/2013); MYRINGOPLASTY, FAT GRAFT (07/05/2013); INSERTION, OSSEOINTEGRATED IMP/TEMPORAL BONE/BAHA (07/05/2013); Adenoidectomy (1960); Hysterectomy (1995); Tonsillectomy (1960); Knee arthroscopy w/ meniscal repair (Left, 1999); ARTHROSCOPY, KNEE (Right, 11/29/2014); EGD, COLONOSCOPY (N/A, 08/02/2016); EGD (N/A, 10/11/2016); Brain surgery (2015); 24, 48, OR 96 HOUR AMBULATORY ESOPHAGEAL PH MONITORING (BRAVO) (N/A, 12/01/2018); INSERTION, OSSEOINTEGRATED IMP/TEMPORAL BONE/BAHA (Right, 11/23/2019); COLONOSCOPY, DIAGNOSTIC (SCREENING) (N/A, 12/17/2021); EGD, BIOPSY (N/A, 12/17/2021); Oophorectomy (1995); Other surgical history (Acoustic neuroma 2015); and Knee surgery (2016).    Allergies  Review status set to Review Complete by Letitia Romberg, MA on 03/24/2023   No Known Allergies          MEDICATIONS:   Patient's current medications were reviewed. ONLY Cardiac medications were updated unless others were addressed in assessment and plan.    No current outpatient medications relevant to Cardiology on file.     Current Outpatient Medications (Other)   Medication Sig    zolpidem  Take 1 tablet (6.25 mg) by mouth nightly as needed for Sleep    calcium  carbonate-vitamin D  Take 1 tablet by mouth every evening    polyethylene glycol Take 17 g by mouth as needed    cetirizine Take 1 tablet (5 mg) by mouth every evening    Magnesium  1 tablet (250 mg)    famotidine  Take 1 tablet (20 mg) by mouth    pantoprazole  Take 1 tablet (40 mg) by mouth once daily       FAMILY HISTORY: family history includes Cancer in her mother; Coronary artery disease in her father; Dementia in her father; Depression in her paternal grandmother; Heart disease in her father, maternal uncle, and  paternal grandfather; Heart failure in her father; Melanoma in her father; Ovarian cancer in her mother; Stent in her brother; Stomach cancer in her maternal aunt.    SOCIAL HISTORY: She  reports that she quit smoking about 52 years ago. Her smoking use included cigarettes. She started smoking about 54 years ago. She has a 1 pack-year smoking history. She has never used smokeless tobacco. She reports that she does not currently use alcohol. She reports that she does not use drugs.    PHYSICAL EXAMINATION    Visit Vitals  BP 104/68 (BP Site: Left arm, Patient Position: Sitting, Cuff Size: Medium)   Pulse (!) 58   Ht 1.702 m (5' 7)   Wt 58.1 kg (128 lb)   BMI 20.05 kg/m          Constitutional: Cooperative, alert, no acute distress.  Thin.  Neck: No carotid bruits, JVP normal.  Cardiac: Regular rate and rhythm, normal S1 and S2; soft S4.  Pulmonary: Clear to auscultation bilaterally, no wheezing, no rhonchi, no rales.  Extremities: no edema.  Vascular: +2 pulses in radial artery bilaterally, 2+ pedal pulses bilaterally.      ECG: Sinus rhythm.  Biatrial enlargement.      LABS REVIEWED:   Lab Results   Component Value Date    WBC 5.54 12/18/2015    HGB 14.2 12/18/2015    HCT 41.7 12/18/2015    PLT 116 (L) 12/18/2015     Lab Results   Component Value Date    GLU 91 12/18/2015    BUN 17 12/18/2015    CREAT 1.0 12/18/2015    NA 141 12/18/2015    K 4.7 12/18/2015    CL 107 12/18/2015    CO2 26 12/18/2015    AST 39 (H) 10/29/2014    ALT 27 10/29/2014     Lab Results   Component Value Date    TSH 1.34 03/21/2023     No results found for: CHOL, TRIG, HDL, LDL  No results found for: [facility]  Neurology note reviewed  Echo April 2025:          Summary    * The left ventricle is small.    * Left ventricular systolic function is normal with an ejection fraction by  Biplane Method of Discs of  68 %.    * The mitral valve leaflets are redundant but show normal motion and only  trace regurgitation.    * There is trace to mild tricuspid regurgitation.    * There is a small hemodynamically insignificant pericardial effusion.    * No significant change from prior study 09/2019.     Recommendations    * I will  share these stable results via MyChart.    IMPRESSION:   Ms. Tanney is a 73 y.o. female with the following problems:    Labile blood pressures with a range from low normal systolic blood pressures to moderately elevated systolic blood pressures.  Element of orthostatic hypotension  With lifestyle measures, and with discontinuation of her tricyclic antidepressant, the above issues have largely normalized/resolved  Overall, suspicion for at least mild autonomic insufficiency  Status post schwannoma resection.  Stable follow-up imaging  Migraine headache syndrome  Echo April 2025 with stable small hemodynamically insignificant pericardial effusion normal left ventricular systolic function, minimal valvular regurgitation  Normal a.m. cortisol levels  Acid peptic/gastric disease EGD pending      RECOMMENDATIONS:    To prevent blood pressure elevations continue DASH diet, regular  exercise, weight control, etc.  To prevent orthostasis, I am glad to see that she was able to navigate discontinuation of her tricyclic antidepressant.  She will continue to focus on hydration, orthostatic precautions, etc.  She is at low and quite acceptable cardiac risk for EGD  Thankfully, the echocardiogram indicates that the pericardial effusion is not a clinically significant matter  She is interested in coronary screening in the form of a coronary calcium  scoring exam.  I have ordered that.  We went over that if it showed meaningful coronary calcification it might speak to initiating a statin agent for prevention, and it might speak to an ischemic evaluation  Otherwise if all is well return visit 1 year                                                 Orders Placed This Encounter   Procedures    CT Cardiac Scoring    Office Visit (HRT [place])       No orders of the defined types were placed in this encounter.        SIGNED:    Glean FORBES Aus, MD         This note was generated by the Dragon speech recognition and may contain errors or  omissions not intended by the user. Grammatical errors, random word insertions, deletions, pronoun errors, and incomplete sentences are occasional consequences of this technology due to software limitations. Not all errors are caught or corrected. If there are questions or concerns about the content of this note or information contained within the body of this dictation, they should be addressed directly with the author for clarification.

## 2023-09-09 ENCOUNTER — Encounter (INDEPENDENT_AMBULATORY_CARE_PROVIDER_SITE_OTHER): Payer: Self-pay | Admitting: Cardiovascular Disease

## 2023-09-09 ENCOUNTER — Ambulatory Visit (INDEPENDENT_AMBULATORY_CARE_PROVIDER_SITE_OTHER): Admitting: Cardiovascular Disease

## 2023-09-09 DIAGNOSIS — R030 Elevated blood-pressure reading, without diagnosis of hypertension: Secondary | ICD-10-CM

## 2023-09-09 DIAGNOSIS — R9430 Abnormal result of cardiovascular function study, unspecified: Secondary | ICD-10-CM

## 2023-09-09 DIAGNOSIS — I3139 Other pericardial effusion (noninflammatory): Secondary | ICD-10-CM

## 2023-09-09 DIAGNOSIS — I951 Orthostatic hypotension: Secondary | ICD-10-CM

## 2023-09-09 DIAGNOSIS — I340 Nonrheumatic mitral (valve) insufficiency: Secondary | ICD-10-CM

## 2023-09-10 ENCOUNTER — Other Ambulatory Visit: Payer: Self-pay

## 2023-09-17 ENCOUNTER — Encounter (INDEPENDENT_AMBULATORY_CARE_PROVIDER_SITE_OTHER): Payer: Self-pay | Admitting: Cardiovascular Disease

## 2023-09-17 ENCOUNTER — Other Ambulatory Visit: Payer: Self-pay | Admitting: Cardiovascular Disease

## 2023-09-23 ENCOUNTER — Telehealth (INDEPENDENT_AMBULATORY_CARE_PROVIDER_SITE_OTHER): Payer: Self-pay

## 2023-09-23 ENCOUNTER — Encounter (INDEPENDENT_AMBULATORY_CARE_PROVIDER_SITE_OTHER): Payer: Self-pay | Admitting: Cardiovascular Disease

## 2023-09-23 NOTE — Telephone Encounter (Signed)
 Patient called to report that she had CT CAC done at [facility] in [place] last Wednesday.  She is calling for the result.  Per our records, the results have not yet been read.    Called [facility], [phone]) who confirmed that they are short staffed and reports can take 7-10 days to be read.  So Audrey Ray should be read soon.

## 2023-09-24 ENCOUNTER — Telehealth (INDEPENDENT_AMBULATORY_CARE_PROVIDER_SITE_OTHER): Payer: Self-pay | Admitting: Cardiovascular Disease

## 2023-09-24 NOTE — Telephone Encounter (Signed)
 I called her to review the coronary calcium  scoring exam.  First we discussed a nodule identified.  On full review of the report, the size reported is 3 cm.  She has already worked with Dr. Veto to set up a dedicated chest CT as recommended by the radiologist reading the report.  I let her know that is excellent and verified she should go forward with getting that done.    Second we reviewed the calcium  score which was 27.  This represents minor/minimal coronary calcification.  Certainly there is nothing high risk.  It does bring up the question, in the context of her LDL of 123, and now the presence of minor calcification, whether to start statin therapy to lower risk.  There is certainly some care guidance that any nonzero calcium  score would speak to initiation of a statin agent.  On the other hand, the risk burden of this score is low at age 73.  I shared with her there are clearly 2 options.  If she wishes to be proactive and aggressive about prevention, it would certainly be reasonable to initiate statin therapy to drive down LDL, promote endothelial health, and hopefully lower risk of progressive atherosclerosis.  On the other hand, at this low score, if her bias is to minimize any medication that is not absolutely necessary, and if her bias is to manage these issues with lifestyle, that is within the realm of an acceptable option.  She will process and get back to me.

## 2023-10-01 ENCOUNTER — Other Ambulatory Visit: Payer: Self-pay | Admitting: Internal Medicine

## 2023-10-09 ENCOUNTER — Telehealth (INDEPENDENT_AMBULATORY_CARE_PROVIDER_SITE_OTHER): Payer: Self-pay

## 2023-10-09 ENCOUNTER — Encounter (INDEPENDENT_AMBULATORY_CARE_PROVIDER_SITE_OTHER): Payer: Self-pay

## 2023-10-09 NOTE — Telephone Encounter (Signed)
 In response to referral received via fax from [place] Marathe MD- called patient to assist in scheduling a New Patient appointment to establish care with one of the pulmonary providers. Requested a call back when message is received. Left contact phone number.

## 2023-10-20 ENCOUNTER — Other Ambulatory Visit: Payer: Self-pay | Admitting: Internal Medicine

## 2023-10-21 ENCOUNTER — Other Ambulatory Visit: Payer: Self-pay

## 2023-10-21 DIAGNOSIS — R1013 Epigastric pain: Secondary | ICD-10-CM

## 2023-10-21 NOTE — Progress Notes (Signed)
 Neurology Progress Note                                         Date: October 21, 2023   Patient Name: Audrey Ray, Audrey Ray  CC: dizziness f/up             This is a 73 y.o. 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 Sie stopping nortriptyline , her dizziness has improved and headaches have not resurfaced. She notes her depression returning. No tremor. She is currently seeing pulm and GI    06/27/2023  Has since seen cardiology, wondering if nortriptyline  is contributing. No tremor. Works with Systems analyst on balance, has good balance. No numbness, no tingling. No hx of seizure like activity.     02/2023  She saw a balance doctor who evaluated and treated for crystals, no improvement. She has had MRI Brain and CTH.      She had a vestibular schwannoma removed about 10 years ago. The dizziness started only this past year. It is a vertigo sensation. She is having right ear tinnitus. No vision changes.      Takes nortriptyline  50mg  at night.      She gets headaches (left sided stabbing pain). Dizziness is not associated. No tremor. No LOC. No chest pain, no palpitations.      Eats balanced meals. Does take a multivitamin.     No significant numbness/tingling.     1 cup/coffee in the morning.      Dad had heart disease, mom had ovarian cancer     Notes that word finding trouble will be better if she pauses and waits. Was a Chief Technology Officer, retired in 1995.     Dad was in his 84s when diagnosed with dementia.      No bowel/bladder accidents. No hx of seizures     Manages her own medications     No choking.      Assistive Devices using: none     Social History:  Smoking: only smoked 2 years in college  ETOH: (-)  IVDA: (-)  Exposures:hx of right acoustic neuroma removed, had a CSF leak afterwards for which she was operated on 4 times (ear and nasal),    Driving status: yes, no accidents    Assessment and Plan:     This is a 73 y.o. 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 Referring provider note reviewed. MRI w/ evidence of prior right sided surgery and encephalomalacia. Low suspicion of vestibular migraines. Blood work with elevated B12 and B6 levels, B6 remains high. Seems her family doctor plans to recheck the lab work. Low suspicion of underlying neuropathy, has good balance per personal trainer. Symptoms have significantly improved since stopping nortriptyline . Discussed how history of vestibular schwannoma with complicated course may predispose her to this sensation of dizziness indefinitely, would recommend doing her vestibular therapy exercises on a regular basis to mitigate impact on quality of life.     We discussed he history of depression and current mood. We discussed numbness and tingling in her feet. Discussed cymbalta, patient will discuss further w/ pain specialist tomorrow. I offered  behavioral health referral, patient declined    I am glad her migraine headaches have not returned since stopping nortriptyline , if they ever do return we can consider effexor, given mood as described above     We discussed her BP of 112/69 and HR of 58. We discussed orthostatic hypotension. Low suspicion of Parkinson's given lack of tremor, will defer evaluation     Discussed word finding trouble, low suspicion of underlying MCI, neurocognitive disorder. She recently had neuropsych testing done which were normal.     The patient expresses understanding of the medical treatment plan including risks and benefits and understands the importance of compliance with medical advice. The patient agrees to comply with instructions as dictated and will follow up with health care providers as advised.    30 minutes were  spent in patient care     Medical Hx     Past Medical History:   Diagnosis Date    Abnormal vision     glasses    Arthritis 10/2014    right knee    Balance problem 03/2013    Cochlear implant in place     removed 2021    Colon polyp     Constipation 06/2016    Depression 1990    Dizziness     Fall 12/02/2022    Fecal incontinence     Gastric ulceration 07/2016    Gastroesophageal reflux disease     Controlled with diet    Headache 1990    Left side migraines- controlled by pamalor    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, right ear; has Left ear hearing aid    Helicobacter pylori infection     History of acoustic neuroma     surgery 2015    Hormone replacement therapy (HRT)     Hyperlipidemia     Controlled diet and exercise    Memory loss     Cannot give a date    Migraine     Pericardial effusion 2017    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; 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 per 10/14/19 Echo in Epic    Post-operative nausea and vomiting 1980    controlled with pre-medication        Meds   @MEDSDIVIDED @      Allergies    Thiopental    Review of Systems   ROS     Physical Exam:   There were no vitals filed for this visit.   Physical Exam     Gen:  Well-developed, well-nourished.  No acute distress.  Cooperative with exam.  HEENT:  Normocephalic, atraumatic. Trachea midline  Neck: Normal range of motion.   Lungs:  Normal effort.  Psych: Mood and affect are appropriate.     Neuro Exam:  MENTAL STATUS:  Awake, alert, oriented x 3.  Follows commands. Speech is fluent, non dysarthric.   CRANIAL NERVES:    CN III, IV, VI - EOM full  CN VII - Facial movements symmetric.   CN VIII - Hearing intact to conversational speech.   CN IX, X - Normal phonation.  CN XI - Shoulder shrug symmetric.   CN XII - Tongue protrusion midline  MOTOR:  No pronator drift.  Antigravity throughout.  COORDINATION: Finger to nose without dysmetria. Rapid alternating movements are smooth and fast.   GAIT:  Normal gait.      Labs:   No results found for: HGBA1C, LIPID, ESR, CRP   No visits with  results within 3 Month(s) from this visit.   Latest known visit with results is:   Hospital Outpatient Visit on 07/23/2023   Component Date Value Ref Range Status    RV Systolic Pressure 07/23/2023 24.16   Final    LA Volume Index (BP A-L) 07/23/2023 [id number]   Final    MV E/e' (Average) 07/23/2023 [id number]   Final    IVS Diastolic Thickness (2D) 07/23/2023 1   Final    LVID diastole (2D) 07/23/2023 3.9   Final    LVID systole (2D) 07/23/2023 2.2   Final    BP Mod LV Ejection Fraction 07/23/2023 68   Final    LA Dimension (2D) 07/23/2023 3.1   Final    AV Mean Gradient 07/23/2023 4   Final    AV Peak Velocity 07/23/2023 1.37   Final    AV Area (Cont Eq VTI) 07/23/2023 [id number]   Final    Ao Root Diameter (2D) 07/23/2023 2.8   Final    Prox Ascending Aorta Diameter 07/23/2023 2.9   Final    MV E/A 07/23/2023 1   Final    RV Basal Diastolic Dimension 07/23/2023 2.8   Final    TAPSE 07/23/2023 1.8   Final    Atrial Septum Findings 07/23/2023 No evidence of interatrial shunt by color Doppler.   Final    Aortic Valve Findings 07/23/2023 The aortic valve is tricuspid.   Final    Aortic Valve Findings 07/23/2023 There is no aortic stenosis.   Final    Aortic Valve Findings 07/23/2023 There is no aortic regurgitation.   Final    Pulmonary Valve Findings 07/23/2023 The pulmonic valve is structurally normal.   Final    Pulmonary Valve Findings 07/23/2023 There is no pulmonic regurgitation.   Final    Mitral Valve Findings 07/23/2023 The mitral valve leaflets are redundant but show normal motion and only trace regurgitation.   Final    Tricuspid Valve Findings 07/23/2023 The tricuspid valve is structurally normal.   Final    Tricuspid Valve Findings 07/23/2023 There is trace to mild tricuspid regurgitation.   Final    Tricuspid Valve Findings 07/23/2023 No pulmonary hypertension with estimated right ventricular systolic  pressure of  24 mmHg.   Final    Summary 07/23/2023    Final                    Value:No significant change from prior study 09/2019. The left ventricle is small. Left ventricular systolic function is normal with an ejection fraction by Biplane Method of Discs of  68 %. The mitral valve leaflets are redundant but show normal motion and   only trace regurgitation. There is trace to mild tricuspid regurgitation. There is a small hemodynamically insignificant pericardial effusion.         CT Chest WO Contrast  Result Date: 10/01/2023  1. Groundglass opacities in the right upper lobe larger measuring up to 3.5 cm and smaller right upper and lower lobe groundglass nodules. Follow-up unenhanced chest CT in 6 months as clinically indicated. 2. Remainder as above. Electronically signed by: Lamarr Bellini M.D. [facility] KG: 10/01/23 The following dose reduction techniques were utilized: automated exposure control and/or adjustment of the mA and/or kV according to patient size, and the use of iterative reconstruction technique.

## 2023-10-23 ENCOUNTER — Ambulatory Visit: Attending: Clinical Neurophysiology | Admitting: Clinical Neurophysiology

## 2023-10-23 ENCOUNTER — Encounter: Payer: Self-pay | Admitting: Clinical Neurophysiology

## 2023-10-23 VITALS — BP 112/69 | HR 58 | Temp 97.9°F | Resp 16 | Ht 67.0 in | Wt 131.6 lb

## 2023-10-23 DIAGNOSIS — G43709 Chronic migraine without aura, not intractable, without status migrainosus: Secondary | ICD-10-CM | POA: Insufficient documentation

## 2023-10-23 DIAGNOSIS — D333 Benign neoplasm of cranial nerves: Secondary | ICD-10-CM | POA: Insufficient documentation

## 2023-10-24 NOTE — Progress Notes (Signed)
 CARE TEAM:  Patient Care Team:  Veto Scarlet, MD as PCP - General (Internal Medicine)    ASSESSMENT  1. Spinal stenosis, lumbar region with neurogenic claudication    2. Other spondylosis, lumbar region         Impression:   Patient presents today with signs symptoms what I do believe is low back pain dominantly from lumbar facet arthropathy/arthritis.    I do think that the calf cramping especially because it is unilateral is likely from some type of nerve root impingement either from an isolated nerve root impingement on the left side or from spinal stenosis.    We will proceed as follows    PLAN  Tylenol  Arthritis   MRI Lumbar Spine       Treatment Plan: No orders of the defined types were placed in this encounter.    Follow-up: No follow-ups on file.      HISTORY OF PRESENT ILLNESS  Chief Complaint: Pain of the Lower Back   Age: 73 y.o.  Sex: female     Hand-dominance: right  History of present illness:    Patient presents today for evaluation.    The reason she is seeing us  today a little bit as to his just to establish care.    But also she has been having some low back pain that is little more bothersome to her than it has been in the past.    Incidentally she is having some GI issues she ended up getting a CT of her abdomen and it does indicate that there is significant degenerative changes in both the thoracic and the lumbar spine.    She also notes that she has been having this cramping in the left calf that is been going on it sometimes wakes her at night.    She was given gabapentin  but she does not take it.    She actually hates the idea of taking medication at all.    She takes everything very sparingly because she does not really like to interfere with how her body is working.    She is a highly active individual enjoys a variety of activities including rowing walking and then also personal training.    She has no interest in stopping any of these activities at all and also her pain is not  interfering with her ability to do them.    She has never had an MRI of her lumbar spine.    Regarding the back pain is largely axial but does radiate on the right side to the top of the acetabulum.      Patient Active Problem List   Diagnosis   . Abdominal pain   . Abnormal blood chemistry level   . Abnormal finding in urine   . Abnormal magnetic resonance imaging study   . Acquired inequality of length of extremity   . Abnormal magnetic resonance imaging study   . Acute pharyngitis   . Adjustment disorder with mixed anxiety and depressed mood   . Arthralgia of temporomandibular joint   . Arthralgia of temporomandibular joint   . Asymmetrical sensorineural hearing loss   . Atrophic vaginitis   . Cerebrospinal fluid rhinorrhea   . Congenital cavus deformity of foot   . Contact dermatitis   . Death of relative   . Difficulty balancing   . Dyspepsia   . Dysphagia   . Dysuria   . Elevated aspartate aminotransferase level   . Esophagitis   . Gastroesophageal reflux  disease with esophagitis   . History of total hysterectomy   . Hyperlipidemia   . Hyperpotassemia   . Hypoactive labyrinth, unilateral   . Incontinence of feces   . Insomnia   . Pain of foot   . Metatarsalgia   . Neoplasm of uncertain behavior of skin   . Osteoarthritis   . Osteopenia   . Orthopedic aftercare   . Other specified counseling   . Other specified counseling   . Wound infection   . Vitelliform macular dystrophy   . Upper respiratory infection   . Symptom associated with female genital organs   . Suprapubic pain   . Sleep disturbances   . Snoring   . Severe acute respiratory syndrome coronavirus 2 (SARS-CoV-2) test result unknown   . Sesamoiditis   . Requires hepatitis B vaccination   . Postmenopausal status   . Personal history of colonic polyps   . Palpitations   . Pain of hand   . Encounter for screening for malignant neoplasm of colon   . Abnormal grief reaction   . Acoustic neuroma   . Acquired hammer toe of left foot   . Elevated  blood-pressure reading without diagnosis of hypertension   . Dyspnea   . Does use hearing aid   . Cramps of lower extremity   . Chronic gastritis   . Acute COVID-19   . Family history of malignant neoplasm of ovary   . Gastric reflux   . Hearing loss   . Hemangioma of liver   . Low back pain   . Mitral valve insufficiency   . Morton's neuroma of left foot   . Orthostatic hypotension   . Pericardial effusion   . Poor short-term memory   . Prehypertension   . Vasomotor rhinitis   . Vitamin B deficiency, unspecified   . Word finding difficulty       OBJECTIVE  Constitutional:  No acute distress. Her body mass index is 20.36 kg/m.   Eyes:  Sclera are nonicteric.  Respiratory:  No labored breathing.  Cardiovascular:  No marked edema.  Skin:  No marked skin ulcers.  Psychiatric: Alert and oriented x3.  Pain with facet loading.  Tenderness palpation bilateral lumbar facets.  Full-strength bilateral lower extremities.  No significant tenderness palpation over the bilateral greater trochanteric bursa's      IMAGING / STUDIES         No imaging obtained       PROCEDURES  Procedures

## 2023-10-28 ENCOUNTER — Other Ambulatory Visit: Payer: Self-pay | Admitting: Physical Medicine & Rehabilitation

## 2023-10-28 ENCOUNTER — Encounter (INDEPENDENT_AMBULATORY_CARE_PROVIDER_SITE_OTHER): Payer: Self-pay | Admitting: Internal Medicine

## 2023-10-28 ENCOUNTER — Other Ambulatory Visit: Payer: Self-pay

## 2023-10-28 ENCOUNTER — Ambulatory Visit
Admission: RE | Admit: 2023-10-28 | Discharge: 2023-10-28 | Disposition: A | Discharge status: Home / Self Care | Source: Ambulatory Visit

## 2023-10-28 DIAGNOSIS — R1013 Epigastric pain: Secondary | ICD-10-CM

## 2023-11-07 ENCOUNTER — Encounter: Payer: Self-pay | Admitting: Clinical Neurophysiology

## 2023-11-11 ENCOUNTER — Ambulatory Visit (INDEPENDENT_AMBULATORY_CARE_PROVIDER_SITE_OTHER): Admitting: Internal Medicine

## 2023-11-11 ENCOUNTER — Encounter (INDEPENDENT_AMBULATORY_CARE_PROVIDER_SITE_OTHER): Payer: Self-pay | Admitting: Internal Medicine

## 2023-11-11 VITALS — BP 142/64 | HR 53 | Temp 97.2°F | Ht 66.0 in | Wt 131.0 lb

## 2023-11-11 DIAGNOSIS — R053 Chronic cough: Secondary | ICD-10-CM

## 2023-11-11 DIAGNOSIS — R0602 Shortness of breath: Secondary | ICD-10-CM

## 2023-11-11 DIAGNOSIS — R9389 Abnormal findings on diagnostic imaging of other specified body structures: Secondary | ICD-10-CM

## 2023-11-11 DIAGNOSIS — R490 Dysphonia: Secondary | ICD-10-CM

## 2023-11-11 DIAGNOSIS — K2100 Gastro-esophageal reflux disease with esophagitis, without bleeding: Secondary | ICD-10-CM

## 2023-11-11 DIAGNOSIS — J309 Allergic rhinitis, unspecified: Secondary | ICD-10-CM

## 2023-11-11 DIAGNOSIS — R918 Other nonspecific abnormal finding of lung field: Secondary | ICD-10-CM

## 2023-11-11 NOTE — Progress Notes (Signed)
  PULMONARY CONSULT    Patient Name: Audrey Ray, Audrey Ray    Date of Visit:  11/11/2023  Date of Birth: 1950/06/17  AGE: 73 y.o.  Medical Record #: [id number]  Requesting Physician: Richmond JAYSON Guard, MD    HISTORY OF PRESENT ILLNESS:  Audrey Ray  is a 73 y.o.  female, with a history of former cigarette smoking (1 pack year, quit in 1973), chronic allergic rhinitis, GERD with esophagitis, gastritis, dysphagia, chronic pericardial effusion (since 2017), orthostatic hypotension, hyperlipidemia, migraines, vestibular schwannoma (s/p resection in 2015), insomnia, depression, osteoarthritis and osteopenia, who comes in today for an evaluation and management of her abnormal chest CT.     Initial History 11/11/2023  The pt recently had a cardiac CT done on 09/17/2023 and there was an incidental finding of a 3 cm groundglass nodule in the RUL seen. She had a subsequent dedicated chest CT done on 10/01/2023 it showed a 3.5 cm RUL groundglass nodule in addition to smaller groundglass nodules in the RUL and LLL that she would like to get further evaluated.     The pt states that she has had an intermittent discomfort/sensation in the right side of her chest for a few years now. The etiology/trigger for the discomfort is unclear, but she typically feels it during the fall season and occasionally when she drinks cold liquids. The pt states that she also has trouble getting a full breath frequently and she usually has to yawn to get a full deep breath. She also complains of a dry cough and a hoarse voice that has been ongoing for the past couple of months. She initially thought her cough and hoarseness may have been due to reflux and she is currently on therapy for GERD, but her symptoms still persist. The pt states that she currently does not have any other concerning respiratory symptoms. She denies wheezing, chest tightness, hemoptysis, fever, chills.     She does not have a hx of asthma, COPD or any other respiratory  diseases. The pt states that she has never been treated with any inhalers in the past. She typically gets sick with a cold about once a year.     She has a hx of allergies and she tends to have symptoms year round. She also has a chronic runny nose and post nasal drainage year round which have been ongoing for several years now. She notes that the severity of her rhinorrhea has remained the same, but her post nasal drainage tends to worsen and she usually develops hoarseness and right sided chest discomfort every fall. She usually takes an antihistamine all year and she is currently taking Aller-Tec. She used Flonase in the past before she went on her walks outside and it helped stop her runny nose, but she has not been using it recently. She does not find her allergy symptoms too bothersome.     She also has a hx of GERD with esophagitis, gastritis and a hiatal hernia. She notes that she tends to have frequent reflux and heartburn. She has been on Dexilant  60 mg QD for a couple of months now and it has been helping get her GERD under better control.      The pt is a former smoker. She smoked for about 2 years while she was in college, but she has not smoked since then. She smoked 1/2 pack a day for 2 years and she quit in 1973. She denies any marijuana, vaping or hookah use.  The pt denies any exposure to asbestos or strong chemicals. She currently has 4 cats at home and she has had them for several years now. She does not have any allergies to her cats.         Baseline regimen: Aller-Tec QD, Dexilant  60 mg QD, Pepcid  20 mg QD  Previous regimen: Zyrtec QD, Flonase, Pantoprazole  40 mg QD      MEDICATIONS:    Current Outpatient Medications:     calcium  carbonate-vitamin D  600-400 MG-UNIT per tablet, Take 1 tablet by mouth once every evening, Disp: , Rfl:     CALCIUM  CITRATE PO, Take by mouth once daily, Disp: , Rfl:     cetirizine (ZyrTEC) 5 MG tablet, Take 1 tablet (5 mg) by mouth once every evening, Disp: ,  Rfl:     dexlansoprazole  (DEXILANT ) 60 MG XR capsule, Take 1 capsule (60 mg) by mouth once daily, Disp: , Rfl:     famotidine  (PEPCID ) 20 MG tablet, Take 1 tablet (20 mg) by mouth, Disp: , Rfl:     polyethylene glycol (MIRALAX) packet, Take 17 g by mouth as needed, Disp: , Rfl:     VITAMIN D  PO, Take 2,000 IU by mouth, Disp: , Rfl:     zolpidem  (AMBIEN  CR) 6.25 MG CR tablet, Take 1 tablet (6.25 mg) by mouth at bedtime as needed for Sleep, Disp: , Rfl:     Magnesium  250 MG Tablet, 1 tablet (250 mg), Disp: , Rfl:     pantoprazole  (PROTONIX ) 40 MG tablet, Take 1 tablet (40 mg) by mouth once daily (Patient not taking: Reported on 10/23/2023), Disp: , Rfl:     Vitals:    11/11/23 1424   BP: 142/64   Pulse: (!) 53   Temp: 97.2 F (36.2 C)   SpO2: 98%        Physical Exam  Constitutional:       Appearance: Normal appearance.   Cardiovascular:      Rate and Rhythm: Normal rate and regular rhythm.      Pulses: Normal pulses.      Heart sounds: Normal heart sounds.   Pulmonary:      Effort: Pulmonary effort is normal.      Breath sounds: Normal breath sounds. No wheezing, rhonchi or rales.   Musculoskeletal:      Right lower leg: No edema.      Left lower leg: No edema.   Neurological:      Mental Status: She is alert.   Psychiatric:         Mood and Affect: Mood normal.         Behavior: Behavior normal.         PULMONARY DIAGNOSTICS:  Spirometry on 11/11/2023: FVC 2.39 (82%), FEV1 1.86 (83%), FEV1/FVC 78%--Normal spirometry      LABS:  Lab Results   Component Value Date    WBC 5.54 12/18/2015    HCT 41.7 12/18/2015    HGB 14.2 12/18/2015    PLT 116 (L) 12/18/2015    ABSEOSAUTOMA 0.03 12/18/2015    ABSEOSAUTOMA 0.04 10/29/2014      Lab Results   Component Value Date    GLU 91 12/18/2015    ALKPHOS 45 10/29/2014    AST 39 (H) 10/29/2014    ALT 27 10/29/2014    CREAT 1.0 12/18/2015    BUN 17 12/18/2015    NA 141 12/18/2015    K 4.7 12/18/2015    CL 107 12/18/2015    CO2  26 12/18/2015        IMAGING:  CT chest done on 10/01/2023  at [facility] was personally reviewed and demonstrates: Groundglass opacities in the right upper lobe larger measuring up to 3.5 cm and smaller right upper and lower lobe groundglass nodules. There is bibasilar atelectasis and scarring. Follow-up unenhanced chest CT in 6 months as clinically indicated. Pectus excavatum.     CT calcium  score done on 09/17/2023 at [facility] was personally reviewed and demonstrates: Calcium  score 27. 44th percentile for subjects of similar age and gender who are free of clinical cardiovascular disease and treated diabetes. Small pericardial effusion. Partially visualized 3 cm groundglass opacity in right lung. Dedicated CT chest recommended for further evaluation.    CXR PA and LAT views done on 08/21/2019 at [facility] was personally reviewed and demonstrates: Borderline heart size. Mild pectus deformity is seen. No focal infiltrates or pleural effusions are seen.     Echocardiogram done on 07/23/2023 at [facility] was personally reviewed and demonstrates: The left ventricle is small. Left ventricular systolic function is normal with an ejection fraction by Biplane Method of Discs of 68%. The mitral valve leaflets are redundant but show normal motion and only trace regurgitation. There is trace to mild tricuspid regurgitation. There is a small hemodynamically insignificant pericardial effusion. No significant change from prior study 09/2019.      IMPRESSION:  Audrey Ray is a 73 y.o. female with the following problems:    Pulmonary nodules  Her chest CT done on 10/01/2023 shows that she has groundglass opacities in the right upper lobe larger measuring up to 3.5 cm and smaller right upper and lower lobe groundglass nodules. Right upper lobe groundglass nodule measuring 9 x 7 mm and right lower lobe groundglass nodule measuring 6 x 5 mm.  The etiology of the pulmonary nodules is unclear but could reflect scarring, but we cannot entirely  rule out a slow growing adenocarcinoma in-situ. There is no clear area for a biopsy of the 3.5 cm opacity and it is unclear if a PET/CT would be helpful given how mild the changes are. I would elect for a repeat chest CT to be done in 6 months to reevaluate.     Former cigarette smoker  ~1 pack year, quit in 1973    Chronic cough and hoarseness  Her spirometry today is normal with an FEV1 of 83%.   Her ongoing cough and hoarseness are most likely due to laryngeal inflammation from allergies, post nasal drainage and GERD. There is no evidence of asthma, COPD or any other pulmonary etiology at this time.   She should continue with her current allergy and GERD regimen.     Right sided chest discomfort  She reports a hx of intermittent right sided chest discomfort that is more prominent during the fall season and with cold liquids.   The etiology is unclear, but it does not seem to be related to the groundglass opacities seen on her recent chest CT.     Chronic allergic rhinitis  She has a hx of chronic allergies all year round with chronic rhinorrhea and post nasal drainage.   She should continue with a daily antihistamine and she can use Flonase prn for increased symptoms.     GERD with esophagitis  Her reflux seems to be improving with daily Dexilant .             RECOMMENDATIONS:  Continue Aller-Tec QD  Resume Flonase 2 sprays/nostril  QD prn for rhinitis  Continue Dexilant  60 mg QD and Pepcid  20 mg QD  Pt is advised to stop eating/drinking at least 2-3 hours prior to bed time. She should also limit caffeine, carbonated drinks, alcohol, chocolate, acidic and spicy foods from her diet.   Order for CT chest to be done in January 2026 to reevaluate her groundglass pulmonary nodules  Follow up in 5 months. Pt is advised to give us  a call sooner if she has any worsening respiratory issues.                                                  Orders Placed This Encounter   Procedures    CT chest without contrast    Spirometry      A total of 66 minutes was spent on this visit reviewing previous notes, counseling the patient and/or family members regarding the patient's condition(s), ordering of tests, managing medications, and documenting the findings in the note.    I personally scribed for Oneil JINNY Lang, MD on 11/11/2023. The documentation recorded by the scribe, Samule Sager, accurately reflects the service I personally performed and the decisions made by me, Oneil JINNY Lang, MD.     SIGNED:  Oneil JINNY Lang, MD

## 2023-11-12 NOTE — Progress Notes (Incomplete)
 [facility] PULMONARY CONSULT    Patient Name: Audrey Ray, Audrey Ray    Date of Visit:  11/11/2023  Date of Birth: 02/11/1951  AGE: 73 y.o.  Medical Record #: [id number]  Requesting Physician: [place] JAYSON Guard, MD    HISTORY OF PRESENT ILLNESS:  Doreather Hoxworth  is a 73 y.o.  female, with a history of chronic allergic rhinitis, GERD with esophagitis, gastritis, dysphagia, pericardial effusion, orthostatic hypotension, hyperlipidemia, insomnia, depression, osteoarthritis and osteopenia, who comes in today for an evaluation and management of her abnormal chest CT.     Initial History 11/11/2023    The pt recently had a cardiac CT done on 09/17/2023 and there was an incidental finding of a 3 cm groundglass nodule in the RUL seen. She had a subsequent chest CT done on 10/01/2023 it once again showed       The pt states that she as had a discomfort/ sensation in her right side chest for a few years now.  Just in the fall. Unclear etology for it.   Sometimes when she drinks cold fluid, she will feel it.   She has also had a dry cough the past few months.   She has had also had a hoarse voice for the past couple fo months and initially thought that it may have been due to reflux.     Frequently she has trouble getting a full breath and has to yawn to take a full deep breath.     She has gastriritis and a hiatal hernia, but never diagnosed with GERD. She has frequent reflux and heartburn.   She is currently on Dexilant  and it has been helping.   She has been on the Dexilant  for copule of months now.       She typically gets 1 cold a year.     She has chronic runny nose for several years now. Sh has chrnoic PND year round  She has a hx of allergies--every fall she would get hoarseness and chest pain every fall.   She takes allergy meds all year. She takes Aller-Tec. She used Flonase, and used it before she goes on walks outside and it helped stop the runny nose.   Her runny nose has rmained the same.   Ever fall she gets more PND and  rigth sided chest discomfort.   Does not find symptoms too bothersome.       The pt states that she currently does not have any other concerning respiratory symptoms. She denies cough, shortness of breath, wheezing, chest pain, chest tightness, hemoptysis, fever, chills.     Her physical activities are not limited by any respiratory symptoms. She is able to exert herself and exercise without any issues.     She does not have a hx of asthma, COPD or any other respiratory diseases. The pt states that she has never been treated with any inhalers in the past.     The pt is a lifelong nonsmoker.   She smoked in college for about 2 years, but nothing since then.   She denies any marijuana, vaping, hookah use.     The pt denies any exposure to asbestos or strong chemicals. She currently has 4 cats at home and she has had them for several years now. She does not have any allergies to her cats.         Baseline regimen: Aller-Tec QD, Dexilant  60 mg QD, Pepcid  20 mg QD  Previous regimen: Zyrtec QD, Flonase, Pantoprazole  40  mg QD      MEDICATIONS:    Current Outpatient Medications:   .  calcium  carbonate-vitamin D  600-400 MG-UNIT per tablet, Take 1 tablet by mouth once every evening, Disp: , Rfl:   .  CALCIUM  CITRATE PO, Take by mouth once daily, Disp: , Rfl:   .  cetirizine (ZyrTEC) 5 MG tablet, Take 1 tablet (5 mg) by mouth once every evening, Disp: , Rfl:   .  dexlansoprazole  (DEXILANT ) 60 MG XR capsule, Take 1 capsule (60 mg) by mouth once daily, Disp: , Rfl:   .  famotidine  (PEPCID ) 20 MG tablet, Take 1 tablet (20 mg) by mouth, Disp: , Rfl:   .  polyethylene glycol (MIRALAX) packet, Take 17 g by mouth as needed, Disp: , Rfl:   .  VITAMIN D  PO, Take 2,000 IU by mouth, Disp: , Rfl:   .  zolpidem  (AMBIEN  CR) 6.25 MG CR tablet, Take 1 tablet (6.25 mg) by mouth at bedtime as needed for Sleep, Disp: , Rfl:   .  Magnesium  250 MG Tablet, 1 tablet (250 mg), Disp: , Rfl:   .  pantoprazole  (PROTONIX ) 40 MG tablet, Take 1 tablet (40  mg) by mouth once daily (Patient not taking: Reported on 10/23/2023), Disp: , Rfl:     Vitals:    11/11/23 1424   BP: 142/64   Pulse: (!) 53   Temp: 97.2 F (36.2 C)   SpO2: 98%        Physical Exam  Constitutional:       Appearance: Normal appearance.   Cardiovascular:      Rate and Rhythm: Normal rate and regular rhythm.      Pulses: Normal pulses.      Heart sounds: Normal heart sounds.   Pulmonary:      Effort: Pulmonary effort is normal.      Breath sounds: Normal breath sounds. No wheezing, rhonchi or rales.   Musculoskeletal:      Right lower leg: No edema.      Left lower leg: No edema.   Neurological:      Mental Status: She is alert.   Psychiatric:         Mood and Affect: Mood normal.         Behavior: Behavior normal.         PULMONARY DIAGNOSTICS:  Spirometry on 11/11/2023: FVC 2.39 (82%), FEV1 1.86 (83%), FEV1/FVC 78%--Normal spirometry      LABS:  Lab Results   Component Value Date    WBC 5.54 12/18/2015    HCT 41.7 12/18/2015    HGB 14.2 12/18/2015    PLT 116 (L) 12/18/2015    ABSEOSAUTOMA 0.03 12/18/2015    ABSEOSAUTOMA 0.04 10/29/2014      Lab Results   Component Value Date    GLU 91 12/18/2015    ALKPHOS 45 10/29/2014    AST 39 (H) 10/29/2014    ALT 27 10/29/2014    CREAT 1.0 12/18/2015    BUN 17 12/18/2015    NA 141 12/18/2015    K 4.7 12/18/2015    CL 107 12/18/2015    CO2 26 12/18/2015        IMAGING:  CT chest done on 10/01/2023 at [facility] was personally reviewed and demonstrates: Groundglass opacities in the right upper lobe larger measuring up to 3.5 cm and smaller right upper and lower lobe groundglass nodules. There is bibasilar atelectasis and scarring. Follow-up unenhanced chest CT in 6 months as clinically indicated.  Pectus excavatum.     CT calcium  score done on 09/17/2023 at [facility] was personally reviewed and demonstrates: Calcium  score 27. 44th percentile for subjects of similar age and gender who are free of clinical cardiovascular disease and  treated diabetes. Small pericardial effusion. Partially visualized 3 cm groundglass opacity in right lung. Dedicated CT chest recommended for further evaluation.    Echocardiogram done on 07/23/2023 at [facility] was personally reviewed and demonstrates: The left ventricle is small. Left ventricular systolic function is normal with an ejection fraction by Biplane Method of Discs of 68%. The mitral valve leaflets are redundant but show normal motion and only trace regurgitation. There is trace to mild tricuspid regurgitation. There is a small hemodynamically insignificant pericardial effusion. No significant change from prior study 09/2019.      IMPRESSION:  Ms. Harris is a 73 y.o. female with the following problems:    Edit everything, check hx    Pulmonary nodules  Her chest CT done on 10/01/2023 shows that she has groundglass opacities in the right upper lobe larger measuring up to 3.5 cm and smaller right upper and lower lobe groundglass nodules. Right upper lobe groundglass nodule measuring 9 x 7 mm and right lower lobe groundglass nodule measuring 6 x 5 mm.  Unclear etiology Cannot entirely rule out a slow growing adenocarcinoma in-situ , but for the 3 cm opacity, no clear area for biopsy and unclear if PET would be helpful given how mild the changes are would elect for a CT in 6 months.     Cough and hoarseness  Her spirometry today is normal with an FEV1 of 83%.   Likely due to post nasal drainage and reflux  No evidence of asthma or COPD.     Right sided discomfort--Unclear etology, but does not seem to be rlated to groudnlass opacity.     Chronic allergic rhinitis            RECOMMENDATIONS:  Continue Aller-Tec QD  Resume Flonase 2 sprays/nostril QD prn for rhinitis  Continue Dexilant  60 mg QD and Pepcid  20 mg QD  Pt is advised to stop eating/drinking at least 2-3 hours prior to bed time. She should also limit caffeine, carbonated drinks, alcohol, chocolate, acidic and spicy foods from her diet.   Order  for CT chest to be done in January 2026 to reevaluate her groundglass nodules  Follow up in 5 months. Pt is advised to give us  a call sooner if she has any worsening respiratory issues.                                                  Orders Placed This Encounter   Procedures   . CT chest without contrast   . Spirometry       A total of *** minutes was spent on this visit reviewing previous notes, counseling the patient and/or family members regarding the patient's condition(s), ordering of tests, managing medications, and documenting the findings in the note.        SIGNED:  Oneil JINNY Lang, MD

## 2023-11-17 ENCOUNTER — Other Ambulatory Visit: Payer: Self-pay | Admitting: Nurse Practitioner

## 2023-11-17 DIAGNOSIS — N6320 Unspecified lump in the left breast, unspecified quadrant: Secondary | ICD-10-CM

## 2023-11-17 DIAGNOSIS — R928 Other abnormal and inconclusive findings on diagnostic imaging of breast: Secondary | ICD-10-CM

## 2023-11-18 LAB — SPIROMETRY
-: NORMAL
FEF 25-75% (Pre-Bronch) %Pred: 85 %
FEF 25-75% (Pre-Bronch) Actual: 1.55 L/s
FEF 25-75% (Pre-Bronch) Pred: 1.81 L/s
FEF Max (Pre-Bronch) %Pred: 132 %
FEF Max (Pre-Bronch) Actual: 6.61 L/s
FEF Max (Pre-Bronch) Pred: 4.98 L/s
FEF2575 (Lower Limit of Normal): 0.8 L/s
FEF2575 (Standard Deviation): 0.75 L/s
FEFMax (Lower Limit of Normal): 3.12 L/s
FEFMax (Standard Deviation): 1.13 L/s
FEV1 (Lower Limit of Normal): 1.6 L
FEV1 (Pre-Bronch) %Pred: 83 %
FEV1 (Pre-Bronch) Actual: 1.86 L
FEV1 (Pre-Bronch) Pred: 2.24 L
FEV1 (Standard Deviation): 0.38 L
FEV1/FVC (Pre-Bronch) %Pred: 100 %
FEV1/FVC (Pre-Bronch) Actual: 78 %
FEV1/FVC (Pre-Bronch) Pred: 77 %
FVC (Lower Limit of Normal): 2.1 L
FVC (Pre-Bronch) %Pred: 82 %
FVC (Pre-Bronch) Actual: 2.39 L
FVC (Pre-Bronch) Pred: 2.92 L
FVC (Standard Deviation): 0.51 L
PEF (Pre-Actual): 396.7 L/min

## 2023-11-26 ENCOUNTER — Other Ambulatory Visit: Payer: Self-pay | Admitting: Nurse Practitioner

## 2023-12-01 ENCOUNTER — Other Ambulatory Visit: Payer: Self-pay | Admitting: Nurse Practitioner

## 2023-12-16 ENCOUNTER — Ambulatory Visit: Payer: Self-pay | Admitting: Nurse Practitioner

## 2024-01-21 ENCOUNTER — Other Ambulatory Visit (INDEPENDENT_AMBULATORY_CARE_PROVIDER_SITE_OTHER): Payer: Self-pay | Admitting: Internal Medicine

## 2024-01-21 ENCOUNTER — Encounter (INDEPENDENT_AMBULATORY_CARE_PROVIDER_SITE_OTHER): Payer: Self-pay

## 2024-02-27 ENCOUNTER — Other Ambulatory Visit: Payer: Self-pay | Admitting: Internal Medicine

## 2024-03-01 ENCOUNTER — Other Ambulatory Visit: Payer: Self-pay | Admitting: Internal Medicine

## 2024-03-16 ENCOUNTER — Encounter (INDEPENDENT_AMBULATORY_CARE_PROVIDER_SITE_OTHER): Payer: Self-pay | Admitting: Internal Medicine

## 2024-03-29 NOTE — Progress Notes (Unsigned)
 [facility] FOLLOW [facility]    Patient Name: Audrey Ray, Audrey Ray    Date of Visit:  03/30/2024  Date of Birth: 11-Oct-1950  AGE: 74 y.o.  Medical Record #: [id number]  Requesting Physician: [place] JAYSON Guard, MD    HISTORY OF PRESENT ILLNESS:  Jadasia Haws  is a 74 y.o.  female, with a history of former cigarette smoking (1 pack year, quit in 1973), chronic allergic rhinitis, GERD with esophagitis, gastritis, dysphagia, chronic pericardial effusion (since 2017), orthostatic hypotension, hyperlipidemia, migraines, vestibular schwannoma (s/p resection in 2015), insomnia, depression, osteoarthritis and osteopenia, who comes in today for an evaluation and management of her abnormal chest CT.     Initial History 11/11/2023  The pt recently had a cardiac CT done on 09/17/2023 and there was an incidental finding of a 3 cm groundglass nodule in the RUL seen. She had a subsequent dedicated chest CT done on 10/01/2023 it showed a 3.5 cm RUL groundglass nodule in addition to smaller groundglass nodules in the RUL and LLL that she would like to get further evaluated.     The pt states that she has had an intermittent discomfort/sensation in the right side of her chest for a few years now. The etiology/trigger for the discomfort is unclear, but she typically feels it during the fall season and occasionally when she drinks cold liquids. The pt states that she also has trouble getting a full breath frequently and she usually has to yawn to get a full deep breath. She also complains of a dry cough and a hoarse voice that has been ongoing for the past couple of months. She initially thought her cough and hoarseness may have been due to reflux and she is currently on therapy for GERD, but her symptoms still persist. The pt states that she currently does not have any other concerning respiratory symptoms. She denies wheezing, chest tightness, hemoptysis, fever, chills.     She does not have a hx of asthma, COPD or any other respiratory  diseases. The pt states that she has never been treated with any inhalers in the past. She typically gets sick with a cold about once a year.     She has a hx of allergies and she tends to have symptoms year round. She also has a chronic runny nose and post nasal drainage year round which have been ongoing for several years now. She notes that the severity of her rhinorrhea has remained the same, but her post nasal drainage tends to worsen and she usually develops hoarseness and right sided chest discomfort every fall. She usually takes an antihistamine all year and she is currently taking Aller-Tec. She used Flonase in the past before she went on her walks outside and it helped stop her runny nose, but she has not been using it recently. She does not find her allergy symptoms too bothersome.     She also has a hx of GERD with esophagitis, gastritis and a hiatal hernia. She notes that she tends to have frequent reflux and heartburn. She has been on Dexilant  60 mg QD for a couple of months now and it has been helping get her GERD under better control.      The pt is a former smoker. She smoked for about 2 years while she was in college, but she has not smoked since then. She smoked 1/2 pack a day for 2 years and she quit in 1973. She denies any marijuana, vaping or hookah use.  The pt denies any exposure to asbestos or strong chemicals. She currently has 4 cats at home and she has had them for several years now. She does not have any allergies to her cats.     Interval History     03/30/2024  Pt is seen today for a follow up.     CT chest WO contrast done on 03/01/2024 showed stable groundglass nodules, largest measuring 3.3 cm. Per Fleischner guidelines follow up at 18-24 months.    Today, she feels like she is doing well from a respiratory standpoint. She has not developed any new or worsening respiratory symptoms and does not feel limited by her breathing in what she wants to do.     Baseline regimen: Aller-Tec  QD, Dexilant  60 mg QD, Pepcid  20 mg QD  Previous regimen: Zyrtec QD, Flonase, Pantoprazole  40 mg QD      MEDICATIONS:    Current Outpatient Medications:     atorvastatin (LIPITOR) 20 MG tablet, , Disp: , Rfl:     calcium  carbonate-vitamin D  600-400 MG-UNIT per tablet, Take 1 tablet by mouth once every evening, Disp: , Rfl:     CALCIUM  CITRATE PO, Take by mouth once daily, Disp: , Rfl:     cetirizine (ZyrTEC) 5 MG tablet, Take 1 tablet (5 mg) by mouth once every evening, Disp: , Rfl:     dicyclomine (BENTYL) 10 MG/ML injection, , Disp: , Rfl:     DULoxetine (CYMBALTA) 30 MG capsule, , Disp: , Rfl:     famotidine  (PEPCID ) 20 MG tablet, Take 1 tablet (20 mg) by mouth, Disp: , Rfl:     MAGNESIUM  GLYCINATE PO, , Disp: , Rfl:     polyethylene glycol (MIRALAX) packet, Take 17 g by mouth as needed, Disp: , Rfl:     VITAMIN D  PO, Take 2,000 IU by mouth, Disp: , Rfl:     zolpidem  (AMBIEN  CR) 6.25 MG CR tablet, Take 1 tablet (6.25 mg) by mouth at bedtime as needed for Sleep, Disp: , Rfl:     dexlansoprazole  (DEXILANT ) 60 MG XR capsule, Take 1 capsule (60 mg) by mouth once daily, Disp: , Rfl:     Magnesium  250 MG Tablet, 1 tablet (250 mg), Disp: , Rfl:     pantoprazole  (PROTONIX ) 40 MG tablet, Take 1 tablet (40 mg) by mouth once daily (Patient not taking: No sig reported), Disp: , Rfl:     Vitals:    03/30/24 1101   BP: 143/68   Pulse: 63   Temp: 97.4 F (36.3 C)   SpO2: 97%          Physical Exam  Constitutional:       Appearance: Normal appearance.   Cardiovascular:      Rate and Rhythm: Normal rate and regular rhythm.      Pulses: Normal pulses.      Heart sounds: Normal heart sounds.   Pulmonary:      Effort: Pulmonary effort is normal.      Breath sounds: Normal breath sounds. No wheezing, rhonchi or rales.   Musculoskeletal:      Right lower leg: No edema.      Left lower leg: No edema.   Neurological:      Mental Status: She is alert.   Psychiatric:         Mood and Affect: Mood normal.         Behavior: Behavior  normal.         PULMONARY DIAGNOSTICS:  Spirometry on 03/30/2024: FVC 2.42 (82%), FEV1 1.94 (86%), FEV1/FVC 80%-- Normal spirometry  Spirometry on 11/11/2023: FVC 2.39 (82%), FEV1 1.86 (83%), FEV1/FVC 78%--Normal spirometry      LABS:  Lab Results   Component Value Date    WBC 5.54 12/18/2015    HCT 41.7 12/18/2015    HGB 14.2 12/18/2015    PLT 116 (L) 12/18/2015    ABSEOSAUTOMA 0.03 12/18/2015    ABSEOSAUTOMA 0.04 10/29/2014      Lab Results   Component Value Date    GLU 91 12/18/2015    ALKPHOS 45 10/29/2014    AST 39 (H) 10/29/2014    ALT 27 10/29/2014    CREAT 1.0 12/18/2015    BUN 17 12/18/2015    NA 141 12/18/2015    K 4.7 12/18/2015    CL 107 12/18/2015    CO2 26 12/18/2015        IMAGING:  CT chest WO contrast done on 03/01/2024 was personally reviewed and demonstrates: Stable groundglass nodules, largest measuring 3.3 cm. Per Fleischner guidelines follow up at 18-24 months.    CT chest done on 10/01/2023 at [facility] was personally reviewed and demonstrates: Groundglass opacities in the right upper lobe larger measuring up to 3.5 cm and smaller right upper and lower lobe groundglass nodules. There is bibasilar atelectasis and scarring. Follow-up unenhanced chest CT in 6 months as clinically indicated. Pectus excavatum.     CT calcium  score done on 09/17/2023 at [facility] was personally reviewed and demonstrates: Calcium  score 27. 44th percentile for subjects of similar age and gender who are free of clinical cardiovascular disease and treated diabetes. Small pericardial effusion. Partially visualized 3 cm groundglass opacity in right lung. Dedicated CT chest recommended for further evaluation.    CXR PA and LAT views done on 08/21/2019 at [facility] was personally reviewed and demonstrates: Borderline heart size. Mild pectus deformity is seen. No focal infiltrates or pleural effusions are seen.     Echocardiogram done on 07/23/2023 at [facility] was  personally reviewed and demonstrates: The left ventricle is small. Left ventricular systolic function is normal with an ejection fraction by Biplane Method of Discs of 68%. The mitral valve leaflets are redundant but show normal motion and only trace regurgitation. There is trace to mild tricuspid regurgitation. There is a small hemodynamically insignificant pericardial effusion. No significant change from prior study 09/2019.      IMPRESSION:  Ms. Huston is a 74 y.o. female with the following problems:    Pulmonary nodules  Her chest CT done on 10/01/2023 shows that she has groundglass opacities in the right upper lobe larger measuring up to 3.5 cm and smaller right upper and lower lobe groundglass nodules. Right upper lobe groundglass nodule measuring 9 x 7 mm and right lower lobe groundglass nodule measuring 6 x 5 mm.  CT chest WO contrast done on 03/01/2024 showed stable groundglass nodules, largest measuring 3.3 cm. .  The etiology of the pulmonary nodules is likely scarring. We cannot entirely rule out a slow growing adenocarcinoma in-situ, but suspicion is lower given stability. Will plan to repeat a CT chest in 1 yr to monitor for progression.     Former cigarette smoker  ~1 pack year, quit in 1973    Chronic cough and hoarseness  Her spirometry today is normal with an FEV1 of 86% predicted.   Cough and hoarseness are most likely due to laryngeal inflammation from allergies, post nasal drainage and GERD.  There is no evidence of asthma, COPD or any other pulmonary etiology at this time.   She should continue with her current allergy and GERD regimen.     Right sided chest discomfort  She reports a hx of intermittent right sided chest discomfort that is more prominent during the fall season and with cold liquids.   The etiology is unclear, but it does not seem to be related to the groundglass opacities seen on her recent chest CT.     Chronic allergic rhinitis  She has a hx of chronic allergies all year round  with chronic rhinorrhea and post nasal drainage.   She should continue with a daily antihistamine and Flonase prn for increased symptoms.     GERD with esophagitis  Managed with Dexilant  60 mg QD and Pepcid  20 mg QD      RECOMMENDATIONS:  Get CT chest done around 02/2025  Order provided today  Continue Aller-Tec QD and Flonase 2 sprays/nostril QD prn for rhinitis  Continue Dexilant  60 mg QD and Pepcid  20 mg QD  Pt is advised to stop eating/drinking at least 2-3 hours prior to bed time. She should also limit caffeine, carbonated drinks, alcohol, chocolate, acidic and spicy foods from her diet.   Return in about 1 year (around 03/30/2025).   Pt is advised to call sooner if any worsening respiratory issues arise.    __________________________________  Orders Placed This Encounter   Procedures    Spirometry       A total of 25 minutes was spent on this visit reviewing previous notes, counseling the patient and/or family members regarding the patient's condition(s), ordering of tests, managing medications, and documenting the findings in the note.    I personally scribed for Oneil JINNY Lang, MD  on 03/30/2024. Electronically signed by Forde Molly on 03/30/2024.    The documentation recorded by the scribe accurately reflects the service I personally performed and the decisions made by me, Oneil JINNY Lang, MD, on 03/30/2024.    SIGNED:    Oneil JINNY Lang, MD   [facility]

## 2024-03-30 ENCOUNTER — Encounter (INDEPENDENT_AMBULATORY_CARE_PROVIDER_SITE_OTHER): Payer: Self-pay | Admitting: Internal Medicine

## 2024-03-30 ENCOUNTER — Ambulatory Visit (INDEPENDENT_AMBULATORY_CARE_PROVIDER_SITE_OTHER): Admitting: Internal Medicine

## 2024-03-30 VITALS — BP 143/68 | HR 63 | Temp 97.4°F | Ht 66.0 in | Wt 135.4 lb

## 2024-03-30 DIAGNOSIS — R911 Solitary pulmonary nodule: Secondary | ICD-10-CM

## 2024-03-30 DIAGNOSIS — R918 Other nonspecific abnormal finding of lung field: Secondary | ICD-10-CM

## 2024-03-30 DIAGNOSIS — R9389 Abnormal findings on diagnostic imaging of other specified body structures: Secondary | ICD-10-CM

## 2024-03-30 DIAGNOSIS — R053 Chronic cough: Secondary | ICD-10-CM

## 2024-03-30 DIAGNOSIS — J309 Allergic rhinitis, unspecified: Secondary | ICD-10-CM

## 2024-03-30 DIAGNOSIS — K2100 Gastro-esophageal reflux disease with esophagitis, without bleeding: Secondary | ICD-10-CM

## 2024-03-30 DIAGNOSIS — R490 Dysphonia: Secondary | ICD-10-CM

## 2024-03-31 LAB — SPIROMETRY
-: NORMAL
FEF 25-75% (Pre-Bronch) %Pred: 100 %
FEF 25-75% (Pre-Bronch) Actual: 1.82 L/s
FEF 25-75% (Pre-Bronch) Pred: 1.81 L/s
FEF Max (Pre-Bronch) %Pred: 130 %
FEF Max (Pre-Bronch) Actual: 6.53 L/s
FEF Max (Pre-Bronch) Pred: 4.99 L/s
FEF2575 (Lower Limit of Normal): 0.8 L/s
FEF2575 (Standard Deviation): 0.75 L/s
FEFMax (Lower Limit of Normal): 3.12 L/s
FEFMax (Standard Deviation): 1.13 L/s
FEV1 (Lower Limit of Normal): 1.6 L
FEV1 (Pre-Bronch) %Pred: 86 %
FEV1 (Pre-Bronch) Actual: 1.94 L
FEV1 (Pre-Bronch) Pred: 2.24 L
FEV1 (Standard Deviation): 0.38 L
FEV1/FVC (Pre-Bronch) %Pred: 103 %
FEV1/FVC (Pre-Bronch) Actual: 80 %
FEV1/FVC (Pre-Bronch) Pred: 77 %
FVC (Lower Limit of Normal): 2.1 L
FVC (Pre-Bronch) %Pred: 82 %
FVC (Pre-Bronch) Actual: 2.42 L
FVC (Pre-Bronch) Pred: 2.92 L
FVC (Standard Deviation): 0.51 L
PEF (Pre-Actual): 392 L/min

## 2024-04-01 NOTE — Progress Notes (Signed)
 [facility] CANCER RISK ASSESSMENT      Dear Dr. Veto    I had the pleasure of seeing your patient in our department today for a follow up visit.  If you have any questions, please feel free to contact me.  Again, thank you for allowing me to participate in this patient's care.       History of Present Illness:     Audrey Ray is a 74 y.o. female  with a family history of cancer who presents for a follow up visit.    Interval history, 04/03/2023, since last visit on 03/24/2023:  She had a bilateral screening mammogram on 11/17/2023 which revealed heterogeneously dense breasts, no evidence of malignancy. She had a whole breast ultrasound on 11/17/2023 which revealed a left breast mass at 1:00 recommending biopsy. She had a breast biopsy on 11/26/2023 pathology revealed a fibroadenoma. She denies any recent breast changes. She denies feeling any palpable masses, areas of thickening, skin changes or nipple discharge.    She is followed by pulmonology for lung nodules. She had a chest CT on 03/01/2024 which revealed Stable groundglass nodules, largest measuring 3.3 cm. She was recommended to repeat in 18 months.   She was diagnosed with osteoporosis, she has appointment this month with Endocrinology.   She is followed by Neurology for nerve pain, she had her Cymbalta adjusted.  She is followed by Cardiology, calcium  24%, HLD.  Her GERD is worsening, no relief with medications or diet. She is followed by GI.  Her husband was diagnosed with metastatic prostate cancer. She is going [place] a [place] tomorrow.       Interval History since last visit 12/18/2022:  - She underwent genetic testing, she was found to be negative. Her Beatrice Harvey remains less than general population risk, no change in screening recommendations. We discussed heterogeneously dense breasts and supplemental imaging.   - She is followed by Neurology for dizziness, Neurologist thinks it is related to elevated B 12    To review, Audrey Ray has a history of gastric polyps, is followed by Gastroenterology. She reports a Hemangioma on her liver which has been stable. 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    PERSONAL CANCER HISTORY/TREATMENT:   none    FAMILY HISTORY/PEDIGREE:    Father - Melanoma age 41's  Mother - Ovarian cancer age 14  Maternal Aunt - Stomach cancer age 18's    ANCESTRY:   Maternal: No known Ashkenazi Jewish ancestry.  Eastern European    Paternal: Ashkenazi Jewish      GENETIC TESTING RESULTS:  none    GYNECOLOGIC HISTORY:  Menarche:  9 yrs  Pregnancies: G2P1  Age at first delivery:33  Breastfeeding: No  OCP use: Yes, >10 years  Fertility use: Yes  HRT use: Past user - Combined: Stopped use > 5 yrs ago       Menopause: postmenopausal  No LMP recorded. Patient has had a hysterectomy.   Gynecologic surgeries: TAH/BSO age 13 yo, pt reports benign. Pt reports removed ovaries due to mothers history.   History of abnormal pap?   No history of abnormal pap.     LIFESTYLE FACTORS:  -Marital status: married   -Employment: retired  -Diet: Vegetable intake: 2-3 times per day, Red meat: 1-3 times per week  Processed meat: most days  Dietary restrictions: none  -Exercise: trainer 2 days week, walk or row  -Alcohol use: 2-3 drinks per  week   -Tobacco use:  none    PROVIDERS:  PCP: Veto Richmond BROCKS, MD   Pulmonology: Oneil Lang, MD  Neurology: Trudee Capers, MD  Cardiology: Glean Aus, MD      Allergies   Allergen Reactions    Thiopental Nausea And Vomiting, Nausea Only and Other (See Comments)     Outpatient Medications Marked as Taking for the 04/02/24 encounter (Office Visit) with Hardy Delon RAMAN, FNP   Medication Sig Dispense Refill    atorvastatin (LIPITOR) 20 MG tablet       calcium  carbonate-vitamin D  600-400 MG-UNIT per tablet Take 1 tablet by mouth once every evening      CALCIUM  CITRATE PO Take by mouth once daily      cetirizine (ZyrTEC) 5 MG tablet Take 1 tablet (5 mg) by  mouth as needed for Allergies      dicyclomine (BENTYL) 20 MG tablet Take 1 tablet (20 mg) by mouth once daily      DULoxetine (CYMBALTA) 30 MG capsule       famotidine  (PEPCID ) 20 MG tablet Take 1 tablet (20 mg) by mouth as needed for Heartburn      MAGNESIUM  GLYCINATE PO       Menaquinone-7 (Vitamin K2) 100 MCG Cap Take 1 capsule (100 mcg) by mouth once daily      pantoprazole  (PROTONIX ) 40 MG tablet Take 1 tablet (40 mg) by mouth once daily      polyethylene glycol (MIRALAX) packet Take 17 g by mouth as needed      VITAMIN D  PO Take 2,000 IU by mouth 4 days a week      zolpidem  (AMBIEN  CR) 6.25 MG CR tablet Take 1 tablet (6.25 mg) by mouth at bedtime as needed for Sleep       Past Medical History:   Diagnosis Date    Abnormal vision     glasses    Arthritis 10/2014    right knee    Balance problem 03/2013    Cochlear implant in place     removed 2021    Colon polyp     Constipation 06/2016    Depression 1990    Dizziness     Fall 12/02/2022    Fecal incontinence     Gastric ulceration 07/2016    Gastroesophageal reflux disease     Controlled with diet    Headache 1990    Left side migraines- controlled by pamalor    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, right ear; has Left ear hearing aid    Helicobacter pylori infection     History of acoustic neuroma     surgery 2015    Hormone replacement therapy (HRT)     Hyperlipidemia     Controlled diet and exercise    Memory loss     Cannot give a date    Migraine     Pericardial effusion 2017    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; 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 per 10/14/19 Echo in Epic    Post-operative nausea and vomiting 1980    controlled with pre-medication     Past Surgical History:   Procedure Laterality Date    24, 48, OR 96 HOUR AMBULATORY ESOPHAGEAL PH MONITORING (BRAVO) N/A 12/01/2018    Procedure: EGD, 48 HR PH MONITOR, INSERTION;  Surgeon: Durel Donley SQUIBB, MD;  Location: [facility];  Service: Gastroenterology;  Laterality:  N/A;  egd w/ bravo  q1-unk, md req  30 min    ADENOIDECTOMY  1960    ARTHROSCOPY, KNEE Right 11/29/2014    Procedure: ARTHROSCOPY, KNEE, RIGHT KNEE PARTIAL MENISCECTOMY/SUBCHONDROPLASTY (ORIF TIBIAL PLATEU) ;  Surgeon: Timoteo Bernardino MATSU, MD;  Location: [facility];  Service: Orthopedics;  Laterality: Right;  RIGHT KNEE PARTIAL MENISCECTOMY/SUBCHONDROPLASTY (ORIF TIBIAL PLATEU)      BRAIN SURGERY  2015    Acoustic neuroma and 4 CFL leak procedures    CESAREAN SECTION  1985    COLONOSCOPY, DIAGNOSTIC (SCREENING) N/A 12/17/2021    Procedure: COLONOSCOPY;  Surgeon: Reymundo Hipp, MD;  Location: [facility];  Service: Gastroenterology;  Laterality: N/A;    CRANIOTOMY, TRANS-PETROSAL APPROACH  04/14/2013    Procedure: CHEYENNE MEALS APPROACH;  Surgeon: Carletha Dasie BRAVO, MD;  Location: [facility];  Service: Neurosurgery;  Laterality: Right;  RIGHT TRANSLABYRINTHINE APPROACH FOR VESTIBULAR SCHWANNOMA, ABDOMINAL FAT GRAFT    EGD N/A 10/11/2016    Procedure: EGD;  Surgeon: Gilman Charlanne CROME, MD;  Location: [facility];  Service: Gastroenterology;  Laterality: N/A;  EGD  Q1=N/A    EGD, COLONOSCOPY N/A 08/02/2016    Procedure: EGD, COLONOSCOPY;  Surgeon: Gilman Charlanne CROME, MD;  Location: [facility];  Service: Gastroenterology;  Laterality: N/A;  EGD/COLONOSCOPY  Q1=N    ESOPHAGOGASTRODUODENOSCOPY (EGD), BIOPSY N/A 12/17/2021    Procedure: EGD, BIOPSY;  Surgeon: Reymundo Hipp, MD;  Location: [facility];  Service: Gastroenterology;  Laterality: N/A;    HYSTERECTOMY  1995    INSERTION, OSSEOINTEGRATED IMP/TEMPORAL BONE/BAHA  07/05/2013    Procedure: INSERTION, OSSEOINTEGRATED IMP/TEMPORAL BONE/BAHA;  Surgeon: Melia Christopher ORN, MD PHD;  Location: [facility];  Service: ENT;  Laterality: N/A;  RIGHT BAHA    INSERTION, OSSEOINTEGRATED IMP/TEMPORAL BONE/BAHA Right 11/23/2019    Procedure: REMOVAL OF OSSEOINTEGRATED IMP/TEMPORAL BONE/BAHA;  Surgeon: Sherrilee Dorise LABOR, MD;  Location: [facility];  Service: ENT;  Laterality: Right;  REMOVAL OF RIGHT BAHA ATTRACT, DEBRIDEMENT OF RIGHT MASTOID CAVITY    KNEE ARTHROSCOPY W/ MENISCAL REPAIR Left 1999    KNEE SURGERY  2016    meniscus in both knees    MASTOIDECTOMY, MODIFIED  04/14/2013    Procedure: MASTOIDECTOMY, MODIFIED;  Surgeon: Melia Christopher ORN, MD PHD;  Location: [facility];  Service: ENT;  Laterality: N/A;    MASTOIDECTOMY, MODIFIED  07/05/2013    Procedure: MASTOIDECTOMY, MODIFIED;  Surgeon: Melia Christopher ORN, MD PHD;  Location: [facility];  Service: ENT;  Laterality: N/A;  RIGHT MIDDLE EAR OBLITERATION W/ OVERSEW OF EAR CANAL,    MYRINGOPLASTY, FAT GRAFT  07/05/2013    Procedure: MYRINGOPLASTY, FAT GRAFT;  Surgeon: Melia Christopher ORN, MD PHD;  Location: [facility];  Service: ENT;  Laterality: Right;   ABDOMINAL FAT GRAFT,     OOPHORECTOMY  1995    OTHER SURGICAL HISTORY  Acoustic neuroma 2015    TONSILLECTOMY  1960    TYMPANOPLASTY, EXPLORATION , REMOVAL CHOLESTEATOMA  04/19/2013    Procedure: TYMPANOPLASTY, EXPLORATION , REMOVAL CHOLESTEATOMA;  Surgeon: Melia Christopher ORN, MD PHD;  Location: [facility];  Service: ENT;  Laterality: Right;  Middle Ear Obliteration with fat graft harvest from abdomen; Control of CSF leak     Family History   Problem Relation Name Age of Onset    Ovarian cancer Mother Levern Shook     Cancer Mother Levern Shook     Melanoma Father Eveline Shook     Coronary artery disease Father Eveline Shook     Heart  failure Father Eveline Shook     Dementia Father Eveline Shook     Heart disease Father Eveline Shook     Stomach cancer Maternal Aunt Irma Mendle     Stent Brother Maude Jacobius     Heart disease Paternal Grandfather Salo Jacobius     Depression Paternal Grandmother Crawford Jacobius     Heart disease Maternal Uncle Josef Mendle      Social History     Tobacco Use    Smoking status: Former     Current packs/day: 0.00     Average packs/day: 0.5 packs/day for 2.0 years (1.0 ttl pk-yrs)     Types: Cigarettes      Start date: 03/25/1969     Quit date: 03/26/1971     Years since quitting: 53.0    Smokeless tobacco: Never    Tobacco comments:     Smoked in college   Vaping Use    Vaping status: Never Used   Substance Use Topics    Alcohol use: Yes     Alcohol/week: 2.0 - 6.0 standard drinks of alcohol     Types: 2 - 6 Standard drinks or equivalent per week     Comment: Some weeks less or none    Drug use: Never       ROS:   Review of Systems   Constitutional:  Negative for appetite change, chills, fatigue and fever.   HENT:  Positive for hearing loss and tinnitus. Negative for congestion, ear pain, nosebleeds, sinus pain, sore throat, trouble swallowing and voice change.    Eyes:  Negative for pain, discharge and redness.   Respiratory:  Negative for wheezing.    Cardiovascular:  Negative for chest pain, palpitations and leg swelling.   Gastrointestinal:  Negative for abdominal pain, blood in stool, constipation, diarrhea, nausea and vomiting.   Endocrine: Negative for cold intolerance and heat intolerance.   Genitourinary:  Negative for difficulty urinating, dysuria, flank pain, hematuria, pelvic pain and vaginal discharge.   Musculoskeletal:  Negative for arthralgias, back pain, myalgias, neck pain and neck stiffness.   Skin:  Negative for rash and wound.   Neurological:  Positive for dizziness, numbness and headaches. Negative for tremors, seizures, speech difficulty and weakness.   Hematological:  Negative for adenopathy.   Psychiatric/Behavioral:  Negative for agitation and confusion. The patient is not nervous/anxious.        Objective:     Vitals:    04/02/24 1129   BP: 127/73   Pulse: 67   Temp: 97.9 F (36.6 C)   SpO2: 100%         Physical Exam  Constitutional:       Appearance: Normal appearance. She is normal weight.   HENT:      Mouth/Throat:      Mouth: Mucous membranes are moist.   Cardiovascular:      Rate and Rhythm: Normal rate and regular rhythm.   Pulmonary:      Effort: Pulmonary effort is normal.       Breath sounds: Normal breath sounds.   Chest:   Breasts:     Breasts are symmetrical.      Right: No inverted nipple, mass, nipple discharge, skin change or tenderness.      Left: No inverted nipple, mass, nipple discharge, skin change or tenderness.   Abdominal:      General: Abdomen is flat.      Palpations: Abdomen is soft.      Tenderness: There is  no abdominal tenderness. There is no guarding.   Lymphadenopathy:      Head:      Right side of head: No submental or submandibular adenopathy.      Left side of head: No submental or submandibular adenopathy.      Upper Body:      Right upper body: No supraclavicular or axillary adenopathy.      Left upper body: No supraclavicular or axillary adenopathy.   Skin:     General: Skin is warm.   Neurological:      General: No focal deficit present.      Mental Status: She is alert and oriented to person, place, and time.   Psychiatric:         Mood and Affect: Mood normal.         Behavior: Behavior normal.         Thought Content: Thought content normal.         Judgment: Judgment normal.         PATH:    11/26/2023       12/17/2021 EGD                         SURGICAL PATHOLOGY REPORT    DIAGNOSIS:         STOMACH BIOPSY:  MILD CHRONIC GASTRITIS, NEGATIVE FOR INTESTINAL    METAPLASIA, NO HELICOBACTER PYLORI IS SEEN         RADIOLOGY DATA:     03/01/2024 Chest CT  Impression:  Stable groundglass nodules, largest measuring 3.3 cm     11/17/2023 Bilateral Screening Mammogram  Density: heterogeneously dense breast  Impression:  No Mammographic evidence of malignancy    11/19/22 DIGITAL SCREENING MAMMOGRAM WITH CAD AND TOMOSYNTHESIS  Density: heterogeneously dense     IMPRESSION:   No mammographic evidence of malignancy.           Assessment and Plan:   1. Heterogeneously dense tissue of both breasts on mammography  - Mammo Diagnostic w/Tomo Bilat; Future    2. Mass of left breast, unspecified quadrant  - Mammo Diagnostic w/Tomo Bilat; Future    3. Family history of  cancer    4. Family history of ovarian cancer    5. Family history of stomach cancer      Audrey Ray underwent genetic counseling by Rexene Kayser LCGC. She elected to pursue a 76-gene panel of cancer susceptibility genes through [employer] The results were negative, meaning that no mutations were identified in any of the genes analyzed. Further, no variants of unknown significance were identified.     BREAST: A woman's risk for developing breast cancer depends on several factors, including her family history, current age, menopausal status and other reproductive factors, and certain lifestyle choices such as tobacco and alcohol use.      Audrey Ray genetic test result does not clearly suggest that she has inherited an increased risk for developing a primary breast cancer.      When we are unable to identify a genetic cause for breast cancer in the family, we utilize computer models to help risk stratify unaffected individuals. The IBIS / Tyrer-Cuzick risk assessment model estimates that Audrey Ray has a 1.54% risk of developing breast cancer by age 55 yo (in the next 5 years) and further predicts that she has a 3.42% risk of developing breast cancer by age 39yo (lifetime risk). It is very important to keep in mind that  this model (and others) gives approximate, rather than precise, estimates of breast cancer risk based on different risk factors and that each uses different data sets. The Tyrer-Cuzick model takes into account family history of breast cancer in close relatives, current age, biopsy history, BRCA1/2 test result, childbirth history, height and weight, use of hormone replacement therapy, age at menarche, and menopause status.           As per NCCN guidelines V2.2024, for individuals at average risk >40y, screening and follow up should include annual clinical encounter (including ongoing risk assessment, risk reduction counseling, and preferably a clinical breast exam even in asymptomatic  individuals when feasible), annual screening mammogram with tomosynthesis, as well as breast awareness. We discussed whole breast ultrasound to supplement for her heterogeneously dense breasts.     She is due for her mammogram in August 2026. We discussed alternating every 6 months with breast ultrasound due her breast density. We discussed reevaluating after her mammogram in August.      OVARIES:  Audrey Ray reported a family history of ovarian cancer in her mother at age 72.  She has previously pursued surgery to remove her ovaries (bilateral salpingo oophorectomies). There is still a low risk of developing an ovarian-like cancer, even several years following this surgery and therefore we recommend continued gynecologic follow-up.     COLON: The general population (average) lifetime risk for colorectal cancer is approximately ~4.5%. Studies have shown that a family history of colorectal cancer increases a person's risk.  The number of affected relatives, their degree of relationship and their age(s) at diagnosis should all be considered.     Audrey Ray reports no personal history of colon polyps or family history of colon cancer. Audrey Ray should continue to undergo screening as directed by her gastroenterologist. Her last colonoscopy was in 2023, and Audrey Ray plans to follow up as recommended on a 5 year interval. She does have a history of gastric polyps.      SKIN: Because close relatives of individuals with melanoma are at increased risk of developing melanoma, I recommended that Audrey Ray practice sun avoidance and undergo total body skin examination yearly, or more frequently at the recommendation of the treating dermatologist.     Lifestyle Management: We also discussed that the [facility] has additional resources for cancer risk reduction including but not limited to nutrition support, mental health support, stress management and wellness services. We will discuss this further  at subsequent appts. In the interim, encouraged her to continue her healthy lifestyle with daily exercise and work to increase the variety of her fruit and vegetable intake.     Prevention/Lifestyle Interventions   INBODY:     Not done today    Discussed the importance of weight bearing exercise in weight management and in building and maintaining healthy bones.     We discussed the following lifestyle modifications in the prevention and risk reduction for the development of cancer:   - Maintain a healthy weight (healthy BMI 18.9-24.9). Studies show that excess body weight is directly associated with an increased risk of many types of cancer. We reviewed strategies for weight management.   - Be physically active and avoid prolonged sedentary behavior. There is strong evidence that higher levels of physical activity are linked to lower risk of several types of cancer. The American Heart Association recommends a minimum of 150 minutes per week of moderate-intensity physical activity or 75 minutes of vigorous physical activity.  -  Eat a diet rich in fruits, vegetables, whole grains, and beans. Plant based foods are high in nutrients, fiber and phytochemicals that may help prevent cancer.  - Limit consumption of red and processed meats. Eat no more than 12-16 oz of red meat per week. Eating more than 18 oz of red meat per week can increase the risk of colorectal cancer. In addition, some of the preservation methods used in producing processed meat have been shown to cause cancer in laboratory studies.   - Limit alcohol consumption. There is strong evidence that drinking alcohol is directly linked to breast, colorectal, esophageal, liver, oropharyngeal, and gastric cancers.   - Practice sun safety: -Use a sunscreen with an SPF of at least 30 that protects against UVA and UVB rays -Apply sunscreen generously and reapply every 2 hours or after swimming/excessive sweating. -Use physical barriers whenever possible (I.e.  hats, long-sleeve shirts). -Avoid direct sun during peak hours.  -Do not use tanning beds.  -Avoid sunburns.      Genetics   Testing completed as detailed above.      Clinical Trials     Not eligible for any clinical trials at this time. Will reassess at future appointments     Psychosocial Needs   No identified psychosocial needs.     Health Maintenance/Screening   Mammogram: 11/17/2023; heterogeneously dense  Breast MRI: Never  DEXA scan: 02/27/2024 osteoporosis   Pap smear: not indicated  Colorectal cancer screening: Colonoscopy 12/17/21 no polyps, repeat in 5 years  Skin exam: Follows regularly with outside dermatologist  Lung cancer screening: Non-smoker N/A.  HPV vaccination: not eligible      Follow Up/Referrals     - Mammogram ordered for August 2026  - Follow up in one year or as needed    Delon GORMAN Emmer, FNP      The following activities were performed on the date of service:  preparing to see the patient: - chart review   - review of prior labs  - review of prior imaging tests  - review of prior pathology reports  - review of consultant reports  - review of prior hospitalizations  obtaining and/or reviewing the separately obtained history  performing a medically appropriate examination and/or evaluation   counseling and educating the patient/family/caregiver  ordering medications, tests, or procedures  referring and communicating with other health care professionals (when not separately reported)   documenting clinical information in the electronic or other health records  independently interpreting results (not separately reported) and communicating results to the patient/family/caregiver  Total time spent performing activities on date of service:  40  minutes

## 2024-04-02 ENCOUNTER — Encounter: Payer: Self-pay | Admitting: Nurse Practitioner

## 2024-04-02 ENCOUNTER — Telehealth: Payer: Self-pay | Admitting: Nurse Practitioner

## 2024-04-02 ENCOUNTER — Ambulatory Visit: Payer: Medicare Other | Attending: Hematology & Oncology | Admitting: Nurse Practitioner

## 2024-04-02 VITALS — BP 127/73 | HR 67 | Temp 97.9°F | Ht 66.0 in | Wt 133.6 lb

## 2024-04-02 DIAGNOSIS — R92333 Mammographic heterogeneous density, bilateral breasts: Secondary | ICD-10-CM | POA: Insufficient documentation

## 2024-04-02 DIAGNOSIS — Z809 Family history of malignant neoplasm, unspecified: Secondary | ICD-10-CM | POA: Insufficient documentation

## 2024-04-02 DIAGNOSIS — N6320 Unspecified lump in the left breast, unspecified quadrant: Secondary | ICD-10-CM | POA: Insufficient documentation

## 2024-04-02 DIAGNOSIS — Z8041 Family history of malignant neoplasm of ovary: Secondary | ICD-10-CM | POA: Insufficient documentation

## 2024-04-02 DIAGNOSIS — Z800 Family history of malignant neoplasm of digestive organs: Secondary | ICD-10-CM | POA: Insufficient documentation

## 2024-04-02 NOTE — Telephone Encounter (Addendum)
-   Mammogram in August 2025- 08/27 at 9:30 am at [facility] in [place].     - Follow up in one year- 03/30/25 at 10:30 am       ----- Message from Delon GORMAN Emmer, FNP sent at 04/02/2024 11:53 AM EST -----  Regarding: appointments  Hello  Please schedule the following  - Mammogram in August 2025  - Follow up in one year    Thank you  Jen

## 2024-04-09 ENCOUNTER — Encounter (HOSPITAL_COMMUNITY)
Admission: RE | Admit: 2024-04-09 | Discharge: 2024-04-09 | Disposition: A | Discharge status: Home / Self Care | Source: Ambulatory Visit | Attending: Internal Medicine | Admitting: Internal Medicine

## 2024-04-09 DIAGNOSIS — J449 Chronic obstructive pulmonary disease, unspecified: Secondary | ICD-10-CM | POA: Insufficient documentation

## 2024-04-09 NOTE — Progress Notes (Signed)
 Virtual orientation visit completed for pulmonary rehab with COPD. On-site orientation visit scheduled for 04/12/24 at 1 pm.

## 2024-04-12 ENCOUNTER — Encounter (HOSPITAL_COMMUNITY)

## 2024-04-13 ENCOUNTER — Ambulatory Visit

## 2024-04-22 ENCOUNTER — Encounter (HOSPITAL_COMMUNITY)

## 2024-04-29 ENCOUNTER — Encounter (HOSPITAL_COMMUNITY)
Admission: RE | Admit: 2024-04-29 | Discharge: 2024-04-29 | Disposition: A | Source: Ambulatory Visit | Attending: Internal Medicine | Admitting: Internal Medicine

## 2024-04-29 VITALS — Ht 60.0 in | Wt 287.7 lb

## 2024-04-29 DIAGNOSIS — J449 Chronic obstructive pulmonary disease, unspecified: Secondary | ICD-10-CM

## 2024-04-29 NOTE — Progress Notes (Signed)
 Pulmonary Individual Treatment Plan  Patient Details  Name: Paula [name] MRN: [id number] Date of Birth: [birth year] Referring Provider:   Flowsheet Row PULMONARY REHAB OTHER RESP ORIENTATION from 04/29/2024 in [facility] CARDIAC REHABILITATION  Referring Provider Berkeley Bruckner MD    Initial Encounter Date:  Flowsheet Row PULMONARY REHAB OTHER RESP ORIENTATION from 04/29/2024 in [place] [facility] CARDIAC REHABILITATION  Date 04/29/24    Visit Diagnosis: Chronic obstructive pulmonary disease, unspecified COPD type (HCC)  Patient's Home Medications on Admission: Current Medications[1]  Past Medical History: Past Medical History:  Diagnosis Date   Anxiety    Arthritis    COPD (chronic obstructive pulmonary disease) (HCC)    Heart murmur    Hypertension    Lung nodule    LLL   Sleep apnea    CPAP with O2 2 liters    Tubal ectopic pregnancy 1970   Vitelliform macular dystrophy     Tobacco Use: Tobacco Use History[2]  Labs: Review Flowsheet        No data to display           Pulmonary Assessment Scores:  Pulmonary Assessment Scores     Row Name 04/29/24 1503         ADL UCSD   ADL Phase Entry     SOB Score total 41     Rest 0     Walk 2     Stairs 4     Bath 0     Dress 0     Shop 3       CAT Score   CAT Score 18       mMRC Score   mMRC Score 3        UCSD: Self-administered rating of dyspnea associated with activities of daily living (ADLs) 6-point scale (0 = not at all to 5 = maximal or unable to do because of breathlessness)  Scoring Scores range from 0 to 120.  Minimally important difference is 5 units  CAT: CAT can identify the health impairment of COPD patients and is better correlated with disease progression.  CAT has a scoring range of zero to 40. The CAT score is classified into four groups of low (less than 10), medium (10 - 20), high (21-30) and very high (31-40) based on the impact level of disease on health status. A CAT score  over 10 suggests significant symptoms.  A worsening CAT score could be explained by an exacerbation, poor medication adherence, poor inhaler technique, or progression of COPD or comorbid conditions.  CAT MCID is 2 points  mMRC: mMRC (Modified Medical Research Council) Dyspnea Scale is used to assess the degree of baseline functional disability in patients of respiratory disease due to dyspnea. No minimal important difference is established. A decrease in score of 1 point or greater is considered a positive change.   Pulmonary Function Assessment:   Exercise Target Goals: Exercise Program Goal: Individual exercise prescription set using results from initial 6 min walk test and THRR while considering  patients activity barriers and safety.   Exercise Prescription Goal: Initial exercise prescription builds to 30-45 minutes a day of aerobic activity, 2-3 days per week.  Home exercise guidelines will be given to patient during program as part of exercise prescription that the participant will acknowledge.  Education: Aerobic Exercise: - Group verbal and visual presentation on the components of exercise prescription. Introduces F.I.T.T principle from ACSM for exercise prescriptions.  Reviews F.I.T.T. principles of aerobic exercise including progression. Written material  provided at class time.   Education: Resistance Exercise: - Group verbal and visual presentation on the components of exercise prescription. Introduces F.I.T.T principle from ACSM for exercise prescriptions  Reviews F.I.T.T. principles of resistance exercise including progression. Written material provided at class time.    Education: Exercise & Equipment Safety: - Individual verbal instruction and demonstration of equipment use and safety with use of the equipment.   Education: Exercise Physiology & General Exercise Guidelines: - Group verbal and written instruction with models to review the exercise physiology of the  cardiovascular system and associated critical values. Provides general exercise guidelines with specific guidelines to those with heart or lung disease.    Education: Flexibility, Balance, Mind/Body Relaxation: - Group verbal and visual presentation with interactive activity on the components of exercise prescription. Introduces F.I.T.T principle from ACSM for exercise prescriptions. Reviews F.I.T.T. principles of flexibility and balance exercise training including progression. Also discusses the mind body connection.  Reviews various relaxation techniques to help reduce and manage stress (i.e. Deep breathing, progressive muscle relaxation, and visualization). Balance handout provided to take home. Written material provided at class time.   Activity Barriers & Risk Stratification:  Activity Barriers & Cardiac Risk Stratification - 04/09/24 1412       Activity Barriers & Cardiac Risk Stratification   Activity Barriers Arthritis;Deconditioning;Muscular Weakness;Shortness of Breath;Joint Problems          6 Minute Walk:  6 Minute Walk     Row Name 04/29/24 1455         6 Minute Walk   Phase Initial     Distance 600 feet     Walk Time 4 minutes     # of Rest Breaks 2     MPH 1.7     METS 0.13     RPE 15     Perceived Dyspnea  4     VO2 Peak 0.45     Symptoms Yes (comment)     Comments fatigue and SOB     Resting HR 66 bpm     Resting BP 110/60     Resting Oxygen Saturation  94 %     Exercise Oxygen Saturation  during 6 min walk 86 %     Max Ex. HR 107 bpm     Max Ex. BP 140/60     2 Minute Post BP 120/60       Interval HR   1 Minute HR 92     2 Minute HR 97     3 Minute HR 78     4 Minute HR 96     5 Minute HR 107     6 Minute HR 79     2 Minute Post HR 58     Interval Heart Rate? Yes       Interval Oxygen   Interval Oxygen? Yes     Baseline Oxygen Saturation % 94 %     1 Minute Oxygen Saturation % 93 %     1 Minute Liters of Oxygen 0 L     2 Minute Oxygen  Saturation % 88 %     2 Minute Liters of Oxygen 0 L     3 Minute Oxygen Saturation % 92 %     3 Minute Liters of Oxygen 0 L     4 Minute Oxygen Saturation % 92 %     4 Minute Liters of Oxygen 0 L     5 Minute Oxygen Saturation % 86 %  5 Minute Liters of Oxygen 0 L     6 Minute Oxygen Saturation % 91 %     6 Minute Liters of Oxygen 0 L     2 Minute Post Oxygen Saturation % 95 %     2 Minute Post Liters of Oxygen 0 L       Oxygen Initial Assessment:  Oxygen Initial Assessment - 04/09/24 1520       Home Oxygen   Home Oxygen Device Home Concentrator    Sleep Oxygen Prescription Continuous;CPAP    Liters per minute 2    Home Exercise Oxygen Prescription None    Home Resting Oxygen Prescription None    Compliance with Home Oxygen Use Yes      Intervention   Short Term Goals To learn and demonstrate proper pursed lip breathing techniques or other breathing techniques. ;To learn and exhibit compliance with exercise, home and travel O2 prescription;To learn and understand importance of monitoring SPO2 with pulse oximeter and demonstrate accurate use of the pulse oximeter.;To learn and understand importance of maintaining oxygen saturations>88%    Long  Term Goals Exhibits compliance with exercise, home  and travel O2 prescription;Verbalizes importance of monitoring SPO2 with pulse oximeter and return demonstration;Maintenance of O2 saturations>88%;Exhibits proper breathing techniques, such as pursed lip breathing or other method taught during program session;Compliance with respiratory medication          Oxygen Re-Evaluation:   Oxygen Discharge (Final Oxygen Re-Evaluation):   Initial Exercise Prescription:  Initial Exercise Prescription - 04/29/24 1400       Date of Initial Exercise RX and Referring Provider   Date 04/29/24    Referring Provider Berkeley Bruckner MD      NuStep   Level 1    SPM 50    Minutes 15    METs 1.8      Recumbant Elliptical   Level 1     RPM 50    Minutes 15    METs 1.8      Prescription Details   Frequency (times per week) 2    Duration Progress to 30 minutes of continuous aerobic without signs/symptoms of physical distress      Intensity   THRR 40-80% of Max Heartrate 98-130    Ratings of Perceived Exertion 11-13    Perceived Dyspnea 0-4      Resistance Training   Training Prescription Yes    Weight 3 lbs    Reps 10-15          Perform Capillary Blood Glucose checks as needed.  Exercise Prescription Changes:   Exercise Prescription Changes     Row Name 04/29/24 1400             Response to Exercise   Blood Pressure (Admit) 110/60       Blood Pressure (Exercise) 140/60       Blood Pressure (Exit) 120/60       Heart Rate (Admit) 66 bpm       Heart Rate (Exercise) 107 bpm       Heart Rate (Exit) 58 bpm       Oxygen Saturation (Admit) 94 %       Oxygen Saturation (Exercise) 86 %       Oxygen Saturation (Exit) 95 %       Rating of Perceived Exertion (Exercise) 15       Perceived Dyspnea (Exercise) 4       Symptoms fatigue, SOB  Exercise Comments:   Exercise Goals and Review:   Exercise Goals     Row Name 04/29/24 1507             Exercise Goals   Increase Physical Activity Yes       Intervention Provide advice, education, support and counseling about physical activity/exercise needs.;Develop an individualized exercise prescription for aerobic and resistive training based on initial evaluation findings, risk stratification, comorbidities and participant's personal goals.       Expected Outcomes Short Term: Attend rehab on a regular basis to increase amount of physical activity.;Long Term: Add in home exercise to make exercise part of routine and to increase amount of physical activity.;Long Term: Exercising regularly at least 3-5 days a week.       Increase Strength and Stamina Yes       Intervention Provide advice, education, support and counseling about physical  activity/exercise needs.;Develop an individualized exercise prescription for aerobic and resistive training based on initial evaluation findings, risk stratification, comorbidities and participant's personal goals.       Expected Outcomes Short Term: Increase workloads from initial exercise prescription for resistance, speed, and METs.;Short Term: Perform resistance training exercises routinely during rehab and add in resistance training at home;Long Term: Improve cardiorespiratory fitness, muscular endurance and strength as measured by increased METs and functional capacity ( )       Able to understand and use rate of perceived exertion (RPE) scale Yes       Intervention Provide education and explanation on how to use RPE scale       Expected Outcomes Short Term: Able to use RPE daily in rehab to express subjective intensity level;Long Term:  Able to use RPE to guide intensity level when exercising independently       Able to understand and use Dyspnea scale Yes       Intervention Provide education and explanation on how to use Dyspnea scale       Expected Outcomes Short Term: Able to use Dyspnea scale daily in rehab to express subjective sense of shortness of breath during exertion;Long Term: Able to use Dyspnea scale to guide intensity level when exercising independently       Knowledge and understanding of Target Heart Rate Range (THRR) Yes       Intervention Provide education and explanation of THRR including how the numbers were predicted and where they are located for reference       Expected Outcomes Short Term: Able to state/look up THRR;Short Term: Able to use daily as guideline for intensity in rehab;Long Term: Able to use THRR to govern intensity when exercising independently       Able to check pulse independently Yes       Intervention Provide education and demonstration on how to check pulse in carotid and radial arteries.;Review the importance of being able to check your own pulse for  safety during independent exercise       Expected Outcomes Short Term: Able to explain why pulse checking is important during independent exercise;Long Term: Able to check pulse independently and accurately       Understanding of Exercise Prescription Yes       Intervention Provide education, explanation, and written materials on patient's individual exercise prescription       Expected Outcomes Short Term: Able to explain program exercise prescription;Long Term: Able to explain home exercise prescription to exercise independently          Exercise Goals Re-Evaluation :   Discharge  Exercise Prescription (Final Exercise Prescription Changes):  Exercise Prescription Changes - 04/29/24 1400       Response to Exercise   Blood Pressure (Admit) 110/60    Blood Pressure (Exercise) 140/60    Blood Pressure (Exit) 120/60    Heart Rate (Admit) 66 bpm    Heart Rate (Exercise) 107 bpm    Heart Rate (Exit) 58 bpm    Oxygen Saturation (Admit) 94 %    Oxygen Saturation (Exercise) 86 %    Oxygen Saturation (Exit) 95 %    Rating of Perceived Exertion (Exercise) 15    Perceived Dyspnea (Exercise) 4    Symptoms fatigue, SOB          Nutrition:  Target Goals: Understanding of nutrition guidelines, daily intake of sodium 1500mg , cholesterol 200mg , calories 30% from fat and 7% or less from saturated fats, daily to have 5 or more servings of fruits and vegetables.  Education: Nutrition 1 -Group instruction provided by verbal, written material, interactive activities, discussions, models, and posters to present general guidelines for heart healthy nutrition including macronutrients, label reading, and promoting whole foods over processed counterparts. Education serves as pensions consultant of discussion of heart healthy eating for all. Written material provided at class time.     Education: Nutrition 2 -Group instruction provided by verbal, written material, interactive activities, discussions, models,  and posters to present general guidelines for heart healthy nutrition including sodium, cholesterol, and saturated fat. Providing guidance of habit forming to improve blood pressure, cholesterol, and body weight. Written material provided at class time.     Biometrics:  Pre Biometrics - 04/29/24 1507       Pre Biometrics   Height 5' (1.524 m)    Weight 130.5 kg    Waist Circumference 52.5 inches    Hip Circumference 62 inches    Waist to Hip Ratio 0.85 %    BMI (Calculated) 56.19    Grip Strength 13 kg    Single Leg Stand 0 seconds           Nutrition Therapy Plan and Nutrition Goals:   Nutrition Assessments:  MEDIFICTS Score Key: >=70 Need to make dietary changes  40-70 Heart Healthy Diet <= 40 Therapeutic Level Cholesterol Diet  Flowsheet Row PULMONARY REHAB OTHER RESP ORIENTATION from 04/29/2024 in [facility] CARDIAC REHABILITATION  Picture Your Plate Total Score on Admission 60   Picture Your Plate Scores: <59 Unhealthy dietary pattern with much room for improvement. 41-50 Dietary pattern unlikely to meet recommendations for good health and room for improvement. 51-60 More healthful dietary pattern, with some room for improvement.  >60 Healthy dietary pattern, although there may be some specific behaviors that could be improved.   Nutrition Goals Re-Evaluation:   Nutrition Goals Discharge (Final Nutrition Goals Re-Evaluation):   Psychosocial: Target Goals: Acknowledge presence or absence of significant depression and/or stress, maximize coping skills, provide positive support system. Participant is able to verbalize types and ability to use techniques and skills needed for reducing stress and depression.   Education: Stress, Anxiety, and Depression - Group verbal and visual presentation to define topics covered.  Reviews how body is impacted by stress, anxiety, and depression.  Also discusses healthy ways to reduce stress and to treat/manage anxiety and  depression.  Written material provided at class time.   Education: Sleep Hygiene -Provides group verbal and written instruction about how sleep can affect your health.  Define sleep hygiene, discuss sleep cycles and impact of sleep habits. Review good sleep hygiene tips.  Initial Review & Psychosocial Screening:  Initial Psych Review & Screening - 04/09/24 1420       Initial Review   Current issues with Current Psychotropic Meds;History of Depression      Family Dynamics   Good Support System? Yes    Comments Patient's husband and cousins support her.      Barriers   Psychosocial barriers to participate in program The patient should benefit from training in stress management and relaxation.;There are no identifiable barriers or psychosocial needs.      Screening Interventions   Interventions Encouraged to exercise;Provide feedback about the scores to participant;To provide support and resources with identified psychosocial needs    Expected Outcomes Short Term goal: Identification and review with participant of any Quality of Life or Depression concerns found by scoring the questionnaire.;Long Term Goal: Stressors or current issues are controlled or eliminated.;Long Term goal: The participant improves quality of Life and PHQ9 Scores as seen by post scores and/or verbalization of changes;Short Term goal: Utilizing psychosocial counselor, staff and physician to assist with identification of specific Stressors or current issues interfering with healing process. Setting desired goal for each stressor or current issue identified.          Quality of Life Scores:  Scores of 19 and below usually indicate a poorer quality of life in these areas.  A difference of  2-3 points is a clinically meaningful difference.  A difference of 2-3 points in the total score of the Quality of Life Index has been associated with significant improvement in overall quality of life, self-image, physical  symptoms, and general health in studies assessing change in quality of life.  PHQ-9: Review Flowsheet  More data exists      04/29/2024 01/27/2023 12/16/2022 11/11/2022 10/14/2022  Depression screen PHQ 2/9  Decreased Interest 1 1 1 1 1   Down, Depressed, Hopeless 1 1 1 1  0  PHQ - 2 Score 2 2 2 2 1   Altered sleeping 0 1 0 2 0  Tired, decreased energy 2 1 3 3 2   Change in appetite 1 2 0 0 1  Feeling bad or failure about yourself  0 0 0 1 1  Trouble concentrating 0 0 0 0 0  Moving slowly or fidgety/restless 0 0 1 0 0  Suicidal thoughts 0 0 0 0 0  PHQ-9 Score 5 6  6  8  5    Difficult doing work/chores Somewhat difficult Somewhat difficult Somewhat difficult Somewhat difficult Somewhat difficult    Details       Data saved with a previous flowsheet row definition        Interpretation of Total Score  Total Score Depression Severity:  1-4 = Minimal depression, 5-9 = Mild depression, 10-14 = Moderate depression, 15-19 = Moderately severe depression, 20-27 = Severe depression   Psychosocial Evaluation and Intervention:  Psychosocial Evaluation - 04/09/24 1421       Psychosocial Evaluation & Interventions   Interventions Stress management education;Encouraged to exercise with the program and follow exercise prescription    Comments Patient was referred to pulmonary rehab with COPD from [facility]. She has completed the program in 2024. She denies any depression or anxiety but is taking Zoloft. She is currently under some stress due to her aunt just being moved to hospice care. Her uncle in also in declining health and she and her husband have been helping him with home managment task but she feels she is managing the stress well. She had an upper respiratory  illness in 10/25 and has been having fatigue and increased SOB since. She says she was having some trouble sleeping due to her stressors but this has imrpoved. She is looking forward to participating in the program again with her goals being to  build up her stamina and muscle tone and to be able to walk further with less SOB. She has no barriers identified to complete the progrma.    Expected Outcomes Short Term: Patient will start the program and attend consistently. Long Term: Patient will complete the program meeting personal goals.    Continue Psychosocial Services  Follow up required by staff          Psychosocial Re-Evaluation:   Psychosocial Discharge (Final Psychosocial Re-Evaluation):   Education: Education Goals: Education classes will be provided on a weekly basis, covering required topics. Participant will state understanding/return demonstration of topics presented.  Learning Barriers/Preferences:  Learning Barriers/Preferences - 04/09/24 1416       Learning Barriers/Preferences   Learning Barriers None    Learning Preferences Written Material;Skilled Demonstration          General Pulmonary Education Topics:  Infection Prevention: - Provides verbal and written material to individual with discussion of infection control including proper hand washing and proper equipment cleaning during exercise session.   Falls Prevention: - Provides verbal and written material to individual with discussion of falls prevention and safety.   Chronic Lung Disease Review: - Group verbal instruction with posters, models, PowerPoint presentations and videos,  to review new updates, new respiratory medications, new advancements in procedures and treatments. Providing information on websites and 800 numbers for continued self-education. Includes information about supplement oxygen, available portable oxygen systems, continuous and intermittent flow rates, oxygen safety, concentrators, and Medicare reimbursement for oxygen. Explanation of Pulmonary Drugs, including class, frequency, complications, importance of spacers, rinsing mouth after steroid MDI's, and proper cleaning methods for nebulizers. Review of basic lung anatomy  and physiology related to function, structure, and complications of lung disease. Review of risk factors. Discussion about methods for diagnosing sleep apnea and types of masks and machines for OSA. Includes a review of the use of types of environmental controls: home humidity, furnaces, filters, dust mite/pet prevention, HEPA vacuums. Discussion about weather changes, air quality and the benefits of nasal washing. Instruction on Warning signs, infection symptoms, calling MD promptly, preventive modes, and value of vaccinations. Review of effective airway clearance, coughing and/or vibration techniques. Emphasizing that all should Create an Action Plan. Written material provided at class time.   AED/CPR: - Group verbal and written instruction with the use of models to demonstrate the basic use of the AED with the basic ABC's of resuscitation.    Tests and Procedures:  - Group verbal and visual presentation and models provide information about basic cardiac anatomy and function. Reviews the testing methods done to diagnose heart disease and the outcomes of the test results. Describes the treatment choices: Medical Management, Angioplasty, or Coronary Bypass Surgery for treating various heart conditions including Myocardial Infarction, Angina, Valve Disease, and Cardiac Arrhythmias.  Written material provided at class time.   Medication Safety: - Group verbal and visual instruction to review commonly prescribed medications for heart and lung disease. Reviews the medication, class of the drug, and side effects. Includes the steps to properly store meds and maintain the prescription regimen.  Written material given at graduation.   Other: -Provides group and verbal instruction on various topics (see comments)   Knowledge Questionnaire Score:  Knowledge Questionnaire Score -  04/29/24 1501       Knowledge Questionnaire Score   Pre Score 16/18           Core Components/Risk Factors/Patient  Goals at Admission:   Education:Diabetes - Individual verbal and written instruction to review signs/symptoms of diabetes, desired ranges of glucose level fasting, after meals and with exercise. Acknowledge that pre and post exercise glucose checks will be done for 3 sessions at entry of program.   Know Your Numbers and Heart Failure: - Group verbal and visual instruction to discuss disease risk factors for cardiac and pulmonary disease and treatment options.  Reviews associated critical values for Overweight/Obesity, Hypertension, Cholesterol, and Diabetes.  Discusses basics of heart failure: signs/symptoms and treatments.  Introduces Heart Failure Zone chart for action plan for heart failure. Written material provided at class time.   Core Components/Risk Factors/Patient Goals Review:    Core Components/Risk Factors/Patient Goals at Discharge (Final Review):    ITP Comments:  ITP Comments     Row Name 04/09/24 1427 04/29/24 1507         ITP Comments Virtual orientation visit completed for pulmonary rehab with COPD. On-site orientation visit scheduled for 04/12/24 at 1 pm. Patient arrived for 1st visit/orientation/education at 1330. Patient was referred to PR by Dr. Lonni Foy from [facility] due to COPD. During orientation advised patient on arrival and appointment times what to wear, what to do before, during and after exercise. Reviewed attendance and class policy.  Pt is scheduled to return Pulmonary Rehab on 05/04/24 at 1030. Pt was advised to come to class 15 minutes before class starts.  Discussed RPE/Dpysnea scales. Patient participated in warm up stretches. Patient was able to complete 6 minute walk test. Patient was measured for the equipment. Discussed equipment safety with patient. Took patient pre-anthropometric measurements. Patient finished visit at 1500.         Comments: Patient arrived for 1st visit/orientation/education at 1330. Patient was referred to PR by Dr.  Lonni Foy from [facility] due to COPD. During orientation advised patient on arrival and appointment times what to wear, what to do before, during and after exercise. Reviewed attendance and class policy.  Pt is scheduled to return Pulmonary Rehab on 05/04/24 at 1030. Pt was advised to come to class 15 minutes before class starts.  Discussed RPE/Dpysnea scales. Patient participated in warm up stretches. Patient was able to complete 6 minute walk test. Patient was measured for the equipment. Discussed equipment safety with patient. Took patient pre-anthropometric measurements. Patient finished visit at 1500.     [1]  Current Outpatient Medications:    acetaminophen (TYLENOL) 500 MG tablet, Take 500-1,000 mg by mouth every 6 (six) hours as needed (pain.)., Disp: , Rfl:    albuterol  (VENTOLIN  HFA) 108 (90 Base) MCG/ACT inhaler, Inhale 2 puffs into the lungs every 6 (six) hours as needed for wheezing or shortness of breath., Disp: , Rfl:    amLODipine (NORVASC) 5 MG tablet, Take 5 mg by mouth daily., Disp: , Rfl:    azelastine (ASTELIN) 0.1 % nasal spray, Place 2 sprays into both nostrils 2 (two) times daily as needed for allergies. Use in each nostril as directed, Disp: , Rfl:    betamethasone dipropionate (DIPROLENE) 0.05 % ointment, Apply 0.05 % topically 2 (two) times daily., Disp: , Rfl:    budesonide-formoterol (SYMBICORT) 160-4.5 MCG/ACT inhaler, Inhale 2 puffs into the lungs 2 (two) times daily., Disp: , Rfl:    bumetanide (BUMEX) 1 MG tablet, Take 1 mg by mouth  daily., Disp: , Rfl:    Cholecalciferol (VITAMIN D) 125 MCG (5000 UT) CAPS, Take 5,000 Units by mouth in the morning., Disp: , Rfl:    clotrimazole-betamethasone (LOTRISONE) cream, Apply 1 application topically 2 (two) times daily as needed (skin irritation.). , Disp: , Rfl:    diclofenac (VOLTAREN) 75 MG EC tablet, Take 75 mg by mouth daily as needed (arthritis pain)., Disp: , Rfl:    ezetimibe (ZETIA) 10 MG tablet, Take 10 mg by mouth  in the morning., Disp: , Rfl:    ezetimibe (ZETIA) 10 MG tablet, Take 1 tablet by mouth daily., Disp: , Rfl:    famotidine (PEPCID) 40 MG tablet, Take 40 mg by mouth at bedtime., Disp: , Rfl:    fluconazole (DIFLUCAN) 200 MG tablet, Take 200 mg by mouth daily., Disp: , Rfl:    fluticasone (FLONASE) 50 MCG/ACT nasal spray, Place 2 sprays into both nostrils daily as needed for allergies., Disp: , Rfl:    Fluticasone-Umeclidin-Vilant (TRELEGY ELLIPTA) 100-62.5-25 MCG/ACT AEPB, Inhale 5 mcg into the lungs as directed., Disp: , Rfl:    furosemide (LASIX) 20 MG tablet, Take 20 mg by mouth as directed., Disp: , Rfl:    guaiFENesin (MUCINEX) 600 MG 12 hr tablet, Take 600 mg by mouth daily as needed (congestion/COPD issues)., Disp: , Rfl:    hydrochlorothiazide (HYDRODIURIL) 25 MG tablet, Take 25 mg by mouth in the morning., Disp: , Rfl:    hydrocortisone  2.5 % ointment, Apply 2.5 Applications topically as directed. 2.5%, Disp: , Rfl:    ketoconazole (NIZORAL) 2 % cream, Apply 1 Application topically daily., Disp: , Rfl:    Ketotifen Fumarate (ALLERGY EYE DROPS OP), Place 1 drop into both eyes 2 (two) times daily as needed (allergy eyes)., Disp: , Rfl:    loratadine (CLARITIN) 10 MG tablet, Take 10 mg by mouth daily as needed for allergies., Disp: , Rfl:    losartan (COZAAR) 100 MG tablet, Take 100 mg by mouth in the morning. (Patient not taking: Reported on 04/09/2024), Disp: , Rfl:    losartan-hydrochlorothiazide (HYZAAR) 50-12.5 MG tablet, Take 1 tablet by mouth daily. 50/12.5 mg, Disp: , Rfl:    montelukast (SINGULAIR) 10 MG tablet, Take 10 mg by mouth at bedtime., Disp: , Rfl:    montelukast (SINGULAIR) 10 MG tablet, Take 10 mg by mouth at bedtime., Disp: , Rfl:    Multiple Vitamins-Minerals (PRESERVISION AREDS 2 PO), Take 1 tablet by mouth in the morning and at bedtime., Disp: , Rfl:    NON FORMULARY, CPAP, O2 at night, Disp: , Rfl:    omeprazole (PRILOSEC) 40 MG capsule, Take 40 mg by mouth daily  before breakfast., Disp: , Rfl:    sertraline (ZOLOFT) 100 MG tablet, Take 100 mg by mouth at bedtime., Disp: , Rfl:    spironolactone (ALDACTONE) 25 MG tablet, Take 25 mg by mouth daily., Disp: , Rfl:    terbinafine (LAMISIL) 250 MG tablet, Take 250 mg by mouth in the morning., Disp: , Rfl:    Tiotropium Bromide (SPIRIVA RESPIMAT) 2.5 MCG/ACT AERS, Inhale 2.5 mcg into the lungs as directed., Disp: , Rfl:  [2]  Social History Tobacco Use  Smoking Status Former   Current packs/day: 0.75   Average packs/day: 0.8 packs/day for 20.0 years (15.0 ttl pk-yrs)   Types: Cigarettes  Smokeless Tobacco Former   Quit date: 10/31/1998

## 2024-04-29 NOTE — Patient Instructions (Signed)
 Patient Instructions  Patient Details  Name: Paula [name] MRN: [id number] Date of Birth: 13-Dec-1950 Referring Provider:  Berkeley Bruckner, MD  Below are your personal goals for exercise, nutrition, and risk factors. Our goal is to help you stay on track towards obtaining and maintaining these goals. We will be discussing your progress on these goals with you throughout the program.  Initial Exercise Prescription:  Initial Exercise Prescription - 04/29/24 1400       Date of Initial Exercise RX and Referring Provider   Date 04/29/24    Referring Provider Berkeley Bruckner MD      NuStep   Level 1    SPM 50    Minutes 15    METs 1.8      Recumbant Elliptical   Level 1    RPM 50    Minutes 15    METs 1.8      Prescription Details   Frequency (times per week) 2    Duration Progress to 30 minutes of continuous aerobic without signs/symptoms of physical distress      Intensity   THRR 40-80% of Max Heartrate 98-130    Ratings of Perceived Exertion 11-13    Perceived Dyspnea 0-4      Resistance Training   Training Prescription Yes    Weight 3 lbs    Reps 10-15          Exercise Goals: Frequency: Be able to perform aerobic exercise two to three times per week in program working toward 2-5 days per week of home exercise.  Intensity: Work with a perceived exertion of 11 (fairly light) - 15 (hard) while following your exercise prescription.  We will make changes to your prescription with you as you progress through the program.   Duration: Be able to do 30 to 45 minutes of continuous aerobic exercise in addition to a 5 minute warm-up and a 5 minute cool-down routine.   Nutrition Goals: Your personal nutrition goals will be established when you do your nutrition analysis with the dietician.  The following are general nutrition guidelines to follow: Cholesterol < 200mg /day Sodium < 1500mg /day Fiber: Women over 50 yrs - 21 grams per day  Personal Goals:   Tobacco  Use Initial Evaluation: Social History   Tobacco Use  Smoking Status Former   Current packs/day: 0.75   Average packs/day: 0.8 packs/day for 20.0 years (15.0 ttl pk-yrs)   Types: Cigarettes  Smokeless Tobacco Former   Quit date: 10/31/1998    Exercise Goals and Review:  Exercise Goals     Row Name 04/29/24 1507             Exercise Goals   Increase Physical Activity Yes       Intervention Provide advice, education, support and counseling about physical activity/exercise needs.;Develop an individualized exercise prescription for aerobic and resistive training based on initial evaluation findings, risk stratification, comorbidities and participant's personal goals.       Expected Outcomes Short Term: Attend rehab on a regular basis to increase amount of physical activity.;Long Term: Add in home exercise to make exercise part of routine and to increase amount of physical activity.;Long Term: Exercising regularly at least 3-5 days a week.       Increase Strength and Stamina Yes       Intervention Provide advice, education, support and counseling about physical activity/exercise needs.;Develop an individualized exercise prescription for aerobic and resistive training based on initial evaluation findings, risk stratification, comorbidities and participant's personal goals.  Expected Outcomes Short Term: Increase workloads from initial exercise prescription for resistance, speed, and METs.;Short Term: Perform resistance training exercises routinely during rehab and add in resistance training at home;Long Term: Improve cardiorespiratory fitness, muscular endurance and strength as measured by increased METs and functional capacity ( )       Able to understand and use rate of perceived exertion (RPE) scale Yes       Intervention Provide education and explanation on how to use RPE scale       Expected Outcomes Short Term: Able to use RPE daily in rehab to express subjective intensity  level;Long Term:  Able to use RPE to guide intensity level when exercising independently       Able to understand and use Dyspnea scale Yes       Intervention Provide education and explanation on how to use Dyspnea scale       Expected Outcomes Short Term: Able to use Dyspnea scale daily in rehab to express subjective sense of shortness of breath during exertion;Long Term: Able to use Dyspnea scale to guide intensity level when exercising independently       Knowledge and understanding of Target Heart Rate Range (THRR) Yes       Intervention Provide education and explanation of THRR including how the numbers were predicted and where they are located for reference       Expected Outcomes Short Term: Able to state/look up THRR;Short Term: Able to use daily as guideline for intensity in rehab;Long Term: Able to use THRR to govern intensity when exercising independently       Able to check pulse independently Yes       Intervention Provide education and demonstration on how to check pulse in carotid and radial arteries.;Review the importance of being able to check your own pulse for safety during independent exercise       Expected Outcomes Short Term: Able to explain why pulse checking is important during independent exercise;Long Term: Able to check pulse independently and accurately       Understanding of Exercise Prescription Yes       Intervention Provide education, explanation, and written materials on patient's individual exercise prescription       Expected Outcomes Short Term: Able to explain program exercise prescription;Long Term: Able to explain home exercise prescription to exercise independently          Copy of goals given to participant.

## 2024-04-30 ENCOUNTER — Encounter (INDEPENDENT_AMBULATORY_CARE_PROVIDER_SITE_OTHER): Payer: Self-pay | Admitting: *Deleted

## 2024-05-03 ENCOUNTER — Encounter (HOSPITAL_COMMUNITY)

## 2024-05-04 ENCOUNTER — Encounter (HOSPITAL_COMMUNITY)

## 2024-05-06 ENCOUNTER — Encounter (HOSPITAL_COMMUNITY)

## 2024-05-11 ENCOUNTER — Encounter (HOSPITAL_COMMUNITY)

## 2024-05-13 ENCOUNTER — Encounter (HOSPITAL_COMMUNITY)

## 2024-05-18 ENCOUNTER — Encounter (HOSPITAL_COMMUNITY)

## 2024-05-20 ENCOUNTER — Encounter (HOSPITAL_COMMUNITY)

## 2024-05-25 ENCOUNTER — Encounter (HOSPITAL_COMMUNITY)

## 2024-05-27 ENCOUNTER — Encounter (HOSPITAL_COMMUNITY)

## 2024-06-01 ENCOUNTER — Encounter (HOSPITAL_COMMUNITY)

## 2024-06-03 ENCOUNTER — Encounter (HOSPITAL_COMMUNITY)

## 2024-06-08 ENCOUNTER — Encounter (HOSPITAL_COMMUNITY)

## 2024-06-10 ENCOUNTER — Encounter (HOSPITAL_COMMUNITY)

## 2024-06-15 ENCOUNTER — Encounter (HOSPITAL_COMMUNITY)

## 2024-06-17 ENCOUNTER — Encounter (HOSPITAL_COMMUNITY)

## 2024-06-22 ENCOUNTER — Encounter (HOSPITAL_COMMUNITY)

## 2024-06-24 ENCOUNTER — Encounter (HOSPITAL_COMMUNITY)

## 2024-06-29 ENCOUNTER — Encounter (HOSPITAL_COMMUNITY)

## 2024-07-01 ENCOUNTER — Encounter (HOSPITAL_COMMUNITY)

## 2024-07-06 ENCOUNTER — Encounter (HOSPITAL_COMMUNITY)

## 2024-07-08 ENCOUNTER — Encounter (HOSPITAL_COMMUNITY)

## 2024-07-13 ENCOUNTER — Encounter (HOSPITAL_COMMUNITY)

## 2024-07-15 ENCOUNTER — Encounter (HOSPITAL_COMMUNITY)

## 2024-07-20 ENCOUNTER — Encounter (HOSPITAL_COMMUNITY)

## 2024-07-22 ENCOUNTER — Encounter (HOSPITAL_COMMUNITY)

## 2024-07-27 ENCOUNTER — Encounter (HOSPITAL_COMMUNITY)

## 2024-07-29 ENCOUNTER — Encounter (HOSPITAL_COMMUNITY)

## 2024-08-03 ENCOUNTER — Encounter (HOSPITAL_COMMUNITY)

## 2024-08-05 ENCOUNTER — Encounter (HOSPITAL_COMMUNITY)

## 2024-08-10 ENCOUNTER — Encounter (HOSPITAL_COMMUNITY)

## 2024-08-12 ENCOUNTER — Encounter (HOSPITAL_COMMUNITY)

## 2024-08-17 ENCOUNTER — Encounter (HOSPITAL_COMMUNITY)

## 2024-08-19 ENCOUNTER — Encounter (HOSPITAL_COMMUNITY)

## 2024-08-24 ENCOUNTER — Encounter (HOSPITAL_COMMUNITY)

## 2024-08-26 ENCOUNTER — Encounter (HOSPITAL_COMMUNITY)

## 2024-08-31 ENCOUNTER — Encounter (HOSPITAL_COMMUNITY)

## 2024-10-21 ENCOUNTER — Ambulatory Visit (INDEPENDENT_AMBULATORY_CARE_PROVIDER_SITE_OTHER): Admitting: Cardiovascular Disease

## 2025-02-25 ENCOUNTER — Ambulatory Visit

## 2025-03-30 ENCOUNTER — Ambulatory Visit: Admitting: Nurse Practitioner
